# Patient Record
Sex: Female | Born: 1950 | Race: White | Hispanic: No | Marital: Married | Smoking: Current every day smoker
Health system: Southern US, Community
[De-identification: ages and names within clinical notes are randomized; demographics above are authoritative.]

## PROBLEM LIST (undated history)

## (undated) DIAGNOSIS — N289 Disorder of kidney and ureter, unspecified: Secondary | ICD-10-CM

## (undated) DIAGNOSIS — R569 Unspecified convulsions: Secondary | ICD-10-CM

## (undated) DIAGNOSIS — N179 Acute kidney failure, unspecified: Secondary | ICD-10-CM

## (undated) DIAGNOSIS — N390 Urinary tract infection, site not specified: Secondary | ICD-10-CM

## (undated) DIAGNOSIS — I639 Cerebral infarction, unspecified: Secondary | ICD-10-CM

## (undated) DIAGNOSIS — F0280 Dementia in other diseases classified elsewhere without behavioral disturbance: Secondary | ICD-10-CM

## (undated) DIAGNOSIS — E785 Hyperlipidemia, unspecified: Secondary | ICD-10-CM

## (undated) DIAGNOSIS — J449 Chronic obstructive pulmonary disease, unspecified: Secondary | ICD-10-CM

## (undated) DIAGNOSIS — D649 Anemia, unspecified: Secondary | ICD-10-CM

## (undated) DIAGNOSIS — I10 Essential (primary) hypertension: Secondary | ICD-10-CM

## (undated) DIAGNOSIS — R413 Other amnesia: Secondary | ICD-10-CM

## (undated) DIAGNOSIS — F05 Delirium due to known physiological condition: Secondary | ICD-10-CM

## (undated) DIAGNOSIS — Z9289 Personal history of other medical treatment: Secondary | ICD-10-CM

## (undated) DIAGNOSIS — Z8614 Personal history of Methicillin resistant Staphylococcus aureus infection: Secondary | ICD-10-CM

## (undated) DIAGNOSIS — G309 Alzheimer's disease, unspecified: Secondary | ICD-10-CM

## (undated) DIAGNOSIS — G4700 Insomnia, unspecified: Secondary | ICD-10-CM

## (undated) DIAGNOSIS — G929 Unspecified toxic encephalopathy: Secondary | ICD-10-CM

## (undated) DIAGNOSIS — N183 Chronic kidney disease, stage 3 (moderate): Secondary | ICD-10-CM

## (undated) DIAGNOSIS — G92 Toxic encephalopathy: Secondary | ICD-10-CM

## (undated) DIAGNOSIS — J441 Chronic obstructive pulmonary disease with (acute) exacerbation: Secondary | ICD-10-CM

## (undated) DIAGNOSIS — R410 Disorientation, unspecified: Secondary | ICD-10-CM

## (undated) HISTORY — DX: Other amnesia: R41.3

## (undated) HISTORY — DX: Delirium due to known physiological condition: F05

## (undated) HISTORY — DX: Chronic obstructive pulmonary disease, unspecified: J44.9

## (undated) HISTORY — PX: ABDOMINAL HYSTERECTOMY: SHX81

## (undated) HISTORY — PX: BLADDER SURGERY: SHX569

## (undated) HISTORY — PX: KNEE SURGERY: SHX244

---

## 1993-12-10 HISTORY — PX: CHOLECYSTECTOMY: SHX55

## 1998-07-26 ENCOUNTER — Ambulatory Visit (HOSPITAL_BASED_OUTPATIENT_CLINIC_OR_DEPARTMENT_OTHER): Admission: RE | Admit: 1998-07-26 | Discharge: 1998-07-26 | Payer: Self-pay | Admitting: Orthopedic Surgery

## 1998-11-18 ENCOUNTER — Ambulatory Visit (HOSPITAL_COMMUNITY): Admission: RE | Admit: 1998-11-18 | Discharge: 1998-11-18 | Payer: Self-pay | Admitting: Urology

## 1999-07-28 ENCOUNTER — Ambulatory Visit (HOSPITAL_COMMUNITY): Admission: RE | Admit: 1999-07-28 | Discharge: 1999-07-28 | Payer: Self-pay | Admitting: Urology

## 1999-09-15 ENCOUNTER — Encounter: Payer: Self-pay | Admitting: Urology

## 1999-09-18 ENCOUNTER — Encounter (INDEPENDENT_AMBULATORY_CARE_PROVIDER_SITE_OTHER): Payer: Self-pay | Admitting: *Deleted

## 1999-09-18 ENCOUNTER — Inpatient Hospital Stay (HOSPITAL_COMMUNITY): Admission: RE | Admit: 1999-09-18 | Discharge: 1999-09-28 | Payer: Self-pay | Admitting: Urology

## 1999-09-18 ENCOUNTER — Encounter: Payer: Self-pay | Admitting: Urology

## 1999-09-21 ENCOUNTER — Encounter: Payer: Self-pay | Admitting: Urology

## 1999-09-22 ENCOUNTER — Encounter: Payer: Self-pay | Admitting: Urology

## 1999-09-22 ENCOUNTER — Encounter: Payer: Self-pay | Admitting: Pulmonary Disease

## 1999-09-23 ENCOUNTER — Encounter: Payer: Self-pay | Admitting: Pulmonary Disease

## 1999-09-24 ENCOUNTER — Encounter: Payer: Self-pay | Admitting: Critical Care Medicine

## 1999-09-27 ENCOUNTER — Encounter: Payer: Self-pay | Admitting: Urology

## 1999-09-30 ENCOUNTER — Emergency Department (HOSPITAL_COMMUNITY): Admission: EM | Admit: 1999-09-30 | Discharge: 1999-09-30 | Payer: Self-pay

## 1999-10-06 ENCOUNTER — Encounter: Payer: Self-pay | Admitting: Urology

## 1999-10-06 ENCOUNTER — Encounter: Admission: RE | Admit: 1999-10-06 | Discharge: 1999-10-06 | Payer: Self-pay | Admitting: Hematology and Oncology

## 1999-12-21 ENCOUNTER — Other Ambulatory Visit: Admission: RE | Admit: 1999-12-21 | Discharge: 1999-12-21 | Payer: Self-pay | Admitting: Gastroenterology

## 1999-12-21 ENCOUNTER — Encounter (INDEPENDENT_AMBULATORY_CARE_PROVIDER_SITE_OTHER): Payer: Self-pay | Admitting: Specialist

## 2000-01-16 ENCOUNTER — Encounter: Admission: RE | Admit: 2000-01-16 | Discharge: 2000-01-16 | Payer: Self-pay | Admitting: Urology

## 2000-01-16 ENCOUNTER — Encounter: Payer: Self-pay | Admitting: Urology

## 2000-01-29 ENCOUNTER — Other Ambulatory Visit: Admission: RE | Admit: 2000-01-29 | Discharge: 2000-01-29 | Payer: Self-pay | Admitting: Family Medicine

## 2000-03-12 ENCOUNTER — Encounter: Admission: RE | Admit: 2000-03-12 | Discharge: 2000-03-12 | Payer: Self-pay | Admitting: Urology

## 2000-03-12 ENCOUNTER — Encounter: Payer: Self-pay | Admitting: Urology

## 2000-08-06 ENCOUNTER — Ambulatory Visit (HOSPITAL_BASED_OUTPATIENT_CLINIC_OR_DEPARTMENT_OTHER): Admission: RE | Admit: 2000-08-06 | Discharge: 2000-08-07 | Payer: Self-pay | Admitting: Orthopedic Surgery

## 2001-03-20 ENCOUNTER — Ambulatory Visit (HOSPITAL_BASED_OUTPATIENT_CLINIC_OR_DEPARTMENT_OTHER): Admission: RE | Admit: 2001-03-20 | Discharge: 2001-03-20 | Payer: Self-pay | Admitting: *Deleted

## 2001-10-28 ENCOUNTER — Ambulatory Visit (HOSPITAL_COMMUNITY): Admission: RE | Admit: 2001-10-28 | Discharge: 2001-10-28 | Payer: Self-pay | Admitting: *Deleted

## 2004-03-17 ENCOUNTER — Ambulatory Visit (HOSPITAL_COMMUNITY): Admission: RE | Admit: 2004-03-17 | Discharge: 2004-03-17 | Payer: Self-pay | Admitting: Urology

## 2004-03-17 ENCOUNTER — Ambulatory Visit (HOSPITAL_BASED_OUTPATIENT_CLINIC_OR_DEPARTMENT_OTHER): Admission: RE | Admit: 2004-03-17 | Discharge: 2004-03-17 | Payer: Self-pay | Admitting: Urology

## 2004-10-12 ENCOUNTER — Ambulatory Visit (HOSPITAL_COMMUNITY): Admission: RE | Admit: 2004-10-12 | Discharge: 2004-10-12 | Payer: Self-pay | Admitting: Urology

## 2004-10-24 ENCOUNTER — Ambulatory Visit (HOSPITAL_BASED_OUTPATIENT_CLINIC_OR_DEPARTMENT_OTHER): Admission: RE | Admit: 2004-10-24 | Discharge: 2004-10-24 | Payer: Self-pay | Admitting: Urology

## 2004-12-25 ENCOUNTER — Ambulatory Visit (HOSPITAL_COMMUNITY): Admission: RE | Admit: 2004-12-25 | Discharge: 2004-12-25 | Payer: Self-pay | Admitting: Urology

## 2005-01-02 ENCOUNTER — Ambulatory Visit (HOSPITAL_BASED_OUTPATIENT_CLINIC_OR_DEPARTMENT_OTHER): Admission: RE | Admit: 2005-01-02 | Discharge: 2005-01-02 | Payer: Self-pay | Admitting: Urology

## 2005-01-02 ENCOUNTER — Ambulatory Visit (HOSPITAL_COMMUNITY): Admission: RE | Admit: 2005-01-02 | Discharge: 2005-01-02 | Payer: Self-pay | Admitting: Urology

## 2005-01-17 ENCOUNTER — Ambulatory Visit (HOSPITAL_COMMUNITY): Admission: RE | Admit: 2005-01-17 | Discharge: 2005-01-17 | Payer: Self-pay | Admitting: Urology

## 2005-05-23 ENCOUNTER — Inpatient Hospital Stay (HOSPITAL_COMMUNITY): Admission: EM | Admit: 2005-05-23 | Discharge: 2005-05-27 | Payer: Self-pay | Admitting: Emergency Medicine

## 2005-07-26 ENCOUNTER — Encounter: Admission: RE | Admit: 2005-07-26 | Discharge: 2005-07-26 | Payer: Self-pay | Admitting: Orthopedic Surgery

## 2005-10-16 ENCOUNTER — Ambulatory Visit: Payer: Self-pay | Admitting: Gastroenterology

## 2005-10-17 ENCOUNTER — Ambulatory Visit: Payer: Self-pay | Admitting: Gastroenterology

## 2005-10-18 ENCOUNTER — Ambulatory Visit: Payer: Self-pay | Admitting: Family Medicine

## 2005-11-14 ENCOUNTER — Encounter (INDEPENDENT_AMBULATORY_CARE_PROVIDER_SITE_OTHER): Payer: Self-pay | Admitting: *Deleted

## 2005-11-14 ENCOUNTER — Ambulatory Visit: Payer: Self-pay | Admitting: Gastroenterology

## 2005-11-15 ENCOUNTER — Emergency Department (HOSPITAL_COMMUNITY): Admission: EM | Admit: 2005-11-15 | Discharge: 2005-11-15 | Payer: Self-pay | Admitting: Emergency Medicine

## 2005-11-15 ENCOUNTER — Ambulatory Visit: Payer: Self-pay | Admitting: Internal Medicine

## 2005-11-23 ENCOUNTER — Ambulatory Visit (HOSPITAL_COMMUNITY): Admission: RE | Admit: 2005-11-23 | Discharge: 2005-11-23 | Payer: Self-pay | Admitting: Urology

## 2005-12-14 ENCOUNTER — Ambulatory Visit (HOSPITAL_COMMUNITY): Admission: RE | Admit: 2005-12-14 | Discharge: 2005-12-14 | Payer: Self-pay | Admitting: Urology

## 2005-12-19 ENCOUNTER — Ambulatory Visit (HOSPITAL_COMMUNITY): Admission: RE | Admit: 2005-12-19 | Discharge: 2005-12-19 | Payer: Self-pay | Admitting: Urology

## 2005-12-21 ENCOUNTER — Encounter (INDEPENDENT_AMBULATORY_CARE_PROVIDER_SITE_OTHER): Payer: Self-pay | Admitting: Specialist

## 2005-12-21 ENCOUNTER — Inpatient Hospital Stay (HOSPITAL_COMMUNITY): Admission: RE | Admit: 2005-12-21 | Discharge: 2006-01-11 | Payer: Self-pay | Admitting: Urology

## 2006-01-21 ENCOUNTER — Inpatient Hospital Stay (HOSPITAL_COMMUNITY): Admission: RE | Admit: 2006-01-21 | Discharge: 2006-01-26 | Payer: Self-pay | Admitting: Urology

## 2006-02-06 ENCOUNTER — Ambulatory Visit (HOSPITAL_COMMUNITY): Admission: RE | Admit: 2006-02-06 | Discharge: 2006-02-06 | Payer: Self-pay | Admitting: Urology

## 2006-03-25 ENCOUNTER — Inpatient Hospital Stay (HOSPITAL_COMMUNITY): Admission: RE | Admit: 2006-03-25 | Discharge: 2006-03-28 | Payer: Self-pay | Admitting: Urology

## 2006-09-03 ENCOUNTER — Ambulatory Visit (HOSPITAL_COMMUNITY): Admission: RE | Admit: 2006-09-03 | Discharge: 2006-09-03 | Payer: Self-pay | Admitting: Urology

## 2007-10-10 ENCOUNTER — Inpatient Hospital Stay (HOSPITAL_COMMUNITY): Admission: EM | Admit: 2007-10-10 | Discharge: 2007-10-12 | Payer: Self-pay | Admitting: Emergency Medicine

## 2007-10-23 ENCOUNTER — Ambulatory Visit (HOSPITAL_COMMUNITY): Admission: RE | Admit: 2007-10-23 | Discharge: 2007-10-23 | Payer: Self-pay | Admitting: Urology

## 2007-10-28 ENCOUNTER — Ambulatory Visit (HOSPITAL_BASED_OUTPATIENT_CLINIC_OR_DEPARTMENT_OTHER): Admission: RE | Admit: 2007-10-28 | Discharge: 2007-10-28 | Payer: Self-pay | Admitting: Urology

## 2008-07-21 ENCOUNTER — Encounter: Payer: Self-pay | Admitting: Gastroenterology

## 2008-07-27 ENCOUNTER — Ambulatory Visit (HOSPITAL_COMMUNITY): Admission: RE | Admit: 2008-07-27 | Discharge: 2008-07-27 | Payer: Self-pay | Admitting: Urology

## 2008-08-08 ENCOUNTER — Encounter: Payer: Self-pay | Admitting: Gastroenterology

## 2008-08-12 ENCOUNTER — Encounter: Payer: Self-pay | Admitting: Gastroenterology

## 2008-08-30 ENCOUNTER — Encounter: Payer: Self-pay | Admitting: Gastroenterology

## 2008-10-25 ENCOUNTER — Encounter: Payer: Self-pay | Admitting: Gastroenterology

## 2008-10-25 DIAGNOSIS — L408 Other psoriasis: Secondary | ICD-10-CM | POA: Insufficient documentation

## 2008-10-25 DIAGNOSIS — K902 Blind loop syndrome, not elsewhere classified: Secondary | ICD-10-CM | POA: Insufficient documentation

## 2008-10-25 DIAGNOSIS — K219 Gastro-esophageal reflux disease without esophagitis: Secondary | ICD-10-CM | POA: Insufficient documentation

## 2008-10-25 DIAGNOSIS — Z860100 Personal history of colon polyps, unspecified: Secondary | ICD-10-CM | POA: Insufficient documentation

## 2008-10-25 DIAGNOSIS — J449 Chronic obstructive pulmonary disease, unspecified: Secondary | ICD-10-CM | POA: Insufficient documentation

## 2008-10-25 DIAGNOSIS — N809 Endometriosis, unspecified: Secondary | ICD-10-CM | POA: Insufficient documentation

## 2008-10-25 DIAGNOSIS — K222 Esophageal obstruction: Secondary | ICD-10-CM | POA: Insufficient documentation

## 2008-10-25 DIAGNOSIS — F079 Unspecified personality and behavioral disorder due to known physiological condition: Secondary | ICD-10-CM | POA: Insufficient documentation

## 2008-10-25 DIAGNOSIS — N3010 Interstitial cystitis (chronic) without hematuria: Secondary | ICD-10-CM | POA: Insufficient documentation

## 2008-10-25 DIAGNOSIS — N12 Tubulo-interstitial nephritis, not specified as acute or chronic: Secondary | ICD-10-CM | POA: Insufficient documentation

## 2008-10-25 DIAGNOSIS — E861 Hypovolemia: Secondary | ICD-10-CM | POA: Insufficient documentation

## 2008-10-25 DIAGNOSIS — N200 Calculus of kidney: Secondary | ICD-10-CM | POA: Insufficient documentation

## 2008-10-25 DIAGNOSIS — K449 Diaphragmatic hernia without obstruction or gangrene: Secondary | ICD-10-CM | POA: Insufficient documentation

## 2008-10-25 DIAGNOSIS — F0280 Dementia in other diseases classified elsewhere without behavioral disturbance: Secondary | ICD-10-CM | POA: Insufficient documentation

## 2008-10-25 DIAGNOSIS — R519 Headache, unspecified: Secondary | ICD-10-CM | POA: Insufficient documentation

## 2008-10-25 DIAGNOSIS — K912 Postsurgical malabsorption, not elsewhere classified: Secondary | ICD-10-CM | POA: Insufficient documentation

## 2008-10-25 DIAGNOSIS — K589 Irritable bowel syndrome without diarrhea: Secondary | ICD-10-CM | POA: Insufficient documentation

## 2008-10-25 DIAGNOSIS — R51 Headache: Secondary | ICD-10-CM | POA: Insufficient documentation

## 2008-10-25 DIAGNOSIS — K56609 Unspecified intestinal obstruction, unspecified as to partial versus complete obstruction: Secondary | ICD-10-CM | POA: Insufficient documentation

## 2008-10-25 DIAGNOSIS — G309 Alzheimer's disease, unspecified: Secondary | ICD-10-CM

## 2008-10-25 DIAGNOSIS — K6819 Other retroperitoneal abscess: Secondary | ICD-10-CM | POA: Insufficient documentation

## 2008-10-28 ENCOUNTER — Ambulatory Visit: Payer: Self-pay | Admitting: Gastroenterology

## 2008-10-28 DIAGNOSIS — R1310 Dysphagia, unspecified: Secondary | ICD-10-CM | POA: Insufficient documentation

## 2008-10-29 LAB — CONVERTED CEMR LAB
AST: 26 units/L (ref 0–37)
BUN: 21 mg/dL (ref 6–23)
Basophils Absolute: 0 10*3/uL (ref 0.0–0.1)
Bilirubin, Direct: 0.1 mg/dL (ref 0.0–0.3)
Calcium: 9.3 mg/dL (ref 8.4–10.5)
Chloride: 113 meq/L — ABNORMAL HIGH (ref 96–112)
Eosinophils Relative: 1.1 % (ref 0.0–5.0)
Ferritin: 178.7 ng/mL (ref 10.0–291.0)
Folate: 6.7 ng/mL
Glucose, Bld: 73 mg/dL (ref 70–99)
Hemoglobin: 10.4 g/dL — ABNORMAL LOW (ref 12.0–15.0)
Iron: 82 ug/dL (ref 42–145)
Lymphocytes Relative: 29.7 % (ref 12.0–46.0)
Magnesium: 1.6 mg/dL (ref 1.5–2.5)
Neutrophils Relative %: 63.1 % (ref 43.0–77.0)
Potassium: 4.6 meq/L (ref 3.5–5.1)
Saturation Ratios: 26 % (ref 20.0–50.0)
Sodium: 140 meq/L (ref 135–145)
TSH: 1.68 microintl units/mL (ref 0.35–5.50)
Transferrin: 225.6 mg/dL (ref >=212.0)

## 2008-11-01 ENCOUNTER — Ambulatory Visit: Payer: Self-pay | Admitting: Gastroenterology

## 2008-11-01 DIAGNOSIS — E538 Deficiency of other specified B group vitamins: Secondary | ICD-10-CM | POA: Insufficient documentation

## 2008-11-03 ENCOUNTER — Ambulatory Visit: Payer: Self-pay | Admitting: Gastroenterology

## 2008-11-03 ENCOUNTER — Encounter: Payer: Self-pay | Admitting: Gastroenterology

## 2008-11-08 ENCOUNTER — Encounter: Payer: Self-pay | Admitting: Gastroenterology

## 2008-11-16 ENCOUNTER — Ambulatory Visit: Payer: Self-pay | Admitting: Gastroenterology

## 2008-11-24 ENCOUNTER — Ambulatory Visit: Payer: Self-pay | Admitting: Gastroenterology

## 2008-11-30 ENCOUNTER — Ambulatory Visit: Payer: Self-pay | Admitting: Gastroenterology

## 2010-12-31 ENCOUNTER — Encounter: Payer: Self-pay | Admitting: Urology

## 2011-04-24 NOTE — Discharge Summary (Signed)
[name], [name]                   ACCOUNT NO.:  [id number]   MEDICAL RECORD NO.:  [id number]          PATIENT TYPE:  INP   LOCATION:  55                         FACILITY:  [facility]   PHYSICIAN:  Sigmund I. Gaynelle Arabian, M.D.DATE OF BIRTH:  1951-07-25   DATE OF ADMISSION:  10/10/2007  DATE OF DISCHARGE:                               DISCHARGE SUMMARY   ADMISSION DIAGNOSIS:  Distal 1-mm left ureteral stone with perinephric  stranding and severe left hydronephrosis.   DISCHARGE DIAGNOSIS:  Left ureteral obstruction, 1-mm distal left stone.   HISTORY OF PRESENT ILLNESS AND HOSPITAL COURSE:  [name] was admitted  on October 10, 2007.  This is a 60 year old female that lives in  [place] that had left flank pain for 1 week with significant increase  in pain on October 10, 2007.  She had been recently released from  [facility] and was seen again at the [facility] ED with pain,  malaise and nausea.  CT showed a 1-mm stone, left lower ureter stone,  with severe perinephric stranding, left ureteral reimplantation in 2007  by Dr. Amalia Hailey.   The patient has been doing well this hospitalization.  Nausea and  vomiting are now resolved with IV hydration and placement of left PCN.  She is tolerating her p.o. fluids and a regular diet. She had a  percutaneous nephrostomy tube placed in the left kidney 10/11/07 by  interventional radiologist.   The patient has been afebrile throughout this hospital stay;  temperatures have ranged anywhere from 97.9 to 98.6.  Vital signs have  been stable.  Blood pressure has been between 129/44 to 154/62.  Heart  rate has been between 58 and 72.  Room air saturations have been 96% to  97%.  Intake/output have been adequate with Foley output of  approximately 500 mL for the last 24 hours and her left nephrostomy tube  has had an output of 325 mL of pink-tinged urine.  PCN site is  unremarkable with dressing that is CDI.   PLAN:  To discharge the patient  home.  She is to follow up with Dr.  Amalia Hailey this coming week.   DISCHARGE MEDICATIONS:  She will be sent home on:  1. Flomax 0.4 mg on p.o. nightly.  2. Urocit-K 10 mEq one p.o. b.i.d.  3. Cipro 500 mg one p.o. b.i.d. for 5 days.   She is to continue on her home medications which include:  1. Aricept 10 mg daily.  2. Phenergan p.r.n.  3. Vicodin p.r.n.      [name] Ralph, NP      Sigmund I. Gaynelle Arabian, M.D.  Electronically Signed    DW/MEDQ  D:  10/12/2007  T:  10/13/2007  Job:  [id number]

## 2011-04-24 NOTE — Op Note (Signed)
[name], [name]                   ACCOUNT NO.:  [id number]   MEDICAL RECORD NO.:  [id number]          PATIENT TYPE:  AMB   LOCATION:  NESC                         FACILITY:  [facility]   PHYSICIAN:  Domingo Pulse, M.D.  DATE OF BIRTH:  [birth year]   DATE OF PROCEDURE:  10/28/2007  DATE OF DISCHARGE:                               OPERATIVE REPORT   PREOPERATIVE DIAGNOSIS:  Distal ureteral stricture, possible ureteral  calculus.   POSTOPERATIVE DIAGNOSIS:  Distal ureteral stricture, possible ureteral  calculus.   PROCEDURE:  Cystoscopy, removal of the existing left double-J, dilation  of distal left ureteral stricture, ureteroscopy, replacement of left  double-J, left retrograde.   SURGEON:  Domingo Pulse, M.D.   ANESTHESIA:  General.   COMPLICATIONS:  None.   DRAINS:  A 8-French x 24-cm double-J catheter.   BRIEF HISTORY:  This 60 year old female is status post cystectomy and  continent urinary diversion to the urethra.  The patient has undergone  reimplantation of the right ureter due to partial obstruction.  The  patient recently developed some problems with left side with renal  colic.  The patient had a nephrostomy tube placed to decompress the  kidney.  Follow-up retrograde studies did not demonstrated obvious stone  but there had been concern initially that the patient might have a stone  in the distal left ureter.  The patient has a double-J catheter in  place.  She is now to undergo ureteroscopy to evaluate the distal left  ureter and balloon dilation if there is any stricture present.  The  patient understands the risks and benefits of the procedure and gave  full informed consent.   PROCEDURE:  After successful induction of general anesthesia the patient  was placed in the dorsal lithotomy position, prepped with Betadine and  draped in the usual sterile fashion.  Cystoscopy was performed.  The  patient's neobladder was inspected.  No stones could be seen in the  bladder.  There was some mucus as expected.  The right ureteral orifice  was identified.  It was wide open.  It is up high on the right-hand side  and very fishmouthed in nature and clearly there is no obstruction.  On  the left-hand side the existing ureteral stent was seen to exit from the  ureter, high up as one would expect with a neobladder.  The double-J was  grasped and partially removed.  A guidewire was then passed up through  the double-J which was removed completely.  A retrograde study was then  performed.  The patient was noted to have mild dilation of the  collecting system without any clear-cut filling defects identified.  The  patient had a balloon dilator then placed over the wire and was used to  dilate the distal ureter.  This went in easily and it certainly did not  appear there was any clear-cut stricture but it was felt that dilating  this would be worthwhile.  The ureteroscope was then advanced alongside  the wire all way to the kidney.  No stones were identified and it  was a  wide open system.  The guidewire was left in place.  The ureteroscope  was withdrawn.  Under fluoroscopic guidance a double-J catheter was  passed up into the kidney.  This was an 8-French catheter, was to be  left in place  for approximately one week and then removed.  The patient tolerated  procedure well, was taken to recovery in good condition.  She received a  B & O suppository.  She will be sent home with Lorcet Plus as well as  Septra DS and return to see me in follow-up.      Domingo Pulse, M.D.  Electronically Signed     RJE/MEDQ  D:  10/28/2007  T:  10/28/2007  Job:  [id number]

## 2011-04-24 NOTE — Consult Note (Signed)
NAMEKASONDRA, SANDLER                   ACCOUNT NO.:  [id number]   MEDICAL RECORD NO.:  [id number]          PATIENT TYPE:  INP   LOCATION:  29                         FACILITY:  [facility]   PHYSICIAN:  Sigmund I. Gaynelle Arabian, M.D.DATE OF BIRTH:  01-13-1951   DATE OF CONSULTATION:  DATE OF DISCHARGE:  10/12/2007                                 CONSULTATION   NOTE:  Consultation admission note.   HISTORY:  Ms. Kuchar is a 60 year old white married female from [place],  [place], with 1 week of increasing left flank pain.  The patient  had severe increase in pain today, with nausea, malaise.  She was seen  and released from hospital in [place], and told to follow up with her  physician.  However, the patient cannot wait to see Dr. Amalia Hailey until next  week, and was seen at [facility] emergency room with severe left upper  quadrant and left flank pain and left lower quadrant pain, malaise and  nausea.  CT scan shows a 1 mm stone at the lower left ureter with severe  perinephric stranding and multiple stones bilaterally.  It is noted that  the patient is status post left ureteral reimplantation in 2007, with  cystectomy and Studer neobladder several years ago.   PAST MEDICAL HISTORY:  1. Renal stones treated with percutaneous nephrostomy in the past.  2. Cystectomy and Studer bladder.  3. Alzheimer's disease.  4. Tobacco abuse (50 pack years).  5. Psoriasis.   SOCIAL HISTORY:  Alcohol is none.  The patient is married.  The husband  is the power of attorney.   ALLERGIES:  None known.   MEDICATIONS:  Aricept 10 mg one p.o. per day.   REVIEW OF SYSTEMS:  Is significant for fever, general malaise, nausea,  abdominal pain.  Remaining review of systems noncontributory.   ADMISSION PHYSICAL EXAMINATION:  GENERAL:  Shows a thin, dry, ill-  appearing female in moderate distress.  Is somewhat relieved with IV  Dilaudid.  VITAL SIGNS:  Weight is 113 pounds.  Temperature 97.7, pulse 112,  respiratory rate 22, blood pressure 158/97.  NECK:  Supple, nontender.  CHEST:  Was clear to P and A.  BREASTS:  Not applicable.  COR:  S1, S2 normal with sinus tachycardia.  ABDOMEN:  Multiple abdominal incisions and scars.  The patient has 2-3+  tenderness in the left flank, left upper quadrant, left lower quadrant.  GU: Examination shows normal female external genitalia.  EXTREMITIES:  No cyanosis or edema.  PSYCHOLOGIC:  Normal orientation to time, person, place.  The patient  does have a history of Alzheimer's but does answer questions  appropriately.  SKIN:  Is dry.   IMPRESSION:  Left lower ureteral calculus, in patient with Arlina Robes  neobladder/cystectomy for interstitial cystitis and left ureteral  reimplantation with narrowing of the ureter.  She has perinephric  stranding and will have percutaneous nephrostomy tomorrow morning per  Dr. Daryll Brod.      Sigmund I. Gaynelle Arabian, M.D.  Electronically Signed     SIT/MEDQ  D:  10/10/2007  T:  10/12/2007  Job:  [id number]   cc:   Domingo Pulse, M.D.  Fax: [phone]

## 2011-04-27 NOTE — Op Note (Signed)
[name], [name]                   ACCOUNT NO.:  [id number]   MEDICAL RECORD NO.:  [id number]          PATIENT TYPE:  INP   LOCATION:  1409                         FACILITY:  [facility]   PHYSICIAN:  Domingo Pulse, M.D.  DATE OF BIRTH:  1951-04-25   DATE OF PROCEDURE:  12/21/2005  DATE OF DISCHARGE:                                 OPERATIVE REPORT   SERVICE:  Urology.   PREOPERATIVE DIAGNOSES:  Distal right ureteral obstruction status post  cystectomy and substitution cystoplasty.   POSTOPERATIVE DIAGNOSES:  Distal right ureteral obstruction status post  cystectomy and substitution cystoplasty.   PROCEDURE:  Ureteral reimplantation with partial resection.   SURGEON:  Domingo Pulse, M.D.   ASSISTANT:  Hanley Ben, M.D.   ANESTHESIA:  General.   COMPLICATIONS:  Enterotomy repair intraoperatively.   HISTORY:  This 60 year old female had end-stage interstitial cystitis and  underwent cystectomy and urinary diversion to the urethra. A right sided  based system was utilized and the patient developed a very large  capacious  pouch. The patient had some problems in the past with obstruction of the  right ureter. She had this treated endoscopically with balloon dilation in  the distal ureter and placement of stent for approximately 12 weeks.  Following removal of the stent, followup imaging studies showed a good wide  open ureter but over time this has become more obstructive and the patient  has had increasing pain. When the pain became quite severe and it was  apparent that the obstruction had returned, she had a nephrostomy tube  placed and a double-J stent passed down into the neobladder. The patient  felt better with the stent in place but needs to have a permanent solution  to this problem. After some discussion of the options, the patient has  elected to undergo reimplantation of the ureter. We will attempt to do this  through a lateral incision to stay away from the  neobladder. The patient  understands the risks and benefits of the procedure including the  possibility that there could be injury to adjacent structures or damage to  the pouch itself. She gave full informed consent.   DESCRIPTION OF PROCEDURE:  After successful induction of general anesthesia,  the patient was placed in a modified flank position. She was primarily  pushed up somewhat on the right hand side to elevate the operative field and  the bed was broken and she was partially flexed. She was then prepped and  draped in the usual sterile fashion. A modified flank incision was made and  was carried down through the oblique musculature until the retroperitoneal  space was identified. The peritoneal contents were swept medially and the  entire procedure was done through the retroperitoneal space. The ureter was  identified and was grasped with a Babcock clamp, sharp dissection was used  to free this up all the way down to the bladder. The ureter was transected  at that level and the opening into the neobladder was oversewn. The distal  ureter was quite ischemic and narrowed. The neobladder was then filled  with  water and a smallneedle was utilized to find access into the pouch. It was  somewhat difficult to tell exactly where the pouch ended and small bowel  began because the bowel had adhered to the top of the pouch. At one point, a  small opening was made in what was thought was the neobladder because a  needle had been passed into that area and clear urine was obtained but a  small piece of bowel that was adherent to the bladder was inadvertently  opened. This was closed with three layers, the first two of silk and finally  of Vicryl. This was just a tiny little enterotomy which was easily closed.  The entire pouch was mobilized a little further and a nice area to implant  the ureter was found posteriorly. The opening was made after the area had  been identified with the needle and  prompt flow of the irrigation fluid was  obtained. The patient's ureter was then spatulated and a fishmouth  anastomosis made with absolutely no tunneling. The anastomosis made with a  series of interrupted sutures of 4-0 Vicryl. The previously placed double-J  was utilized. The area was inspected, adequate hemostasis had been obtained.  The enterotomy had been very nicely closed and was well away from the area  where the ureteral anastomosis had been performed. A small drain was placed  in that area and was brought out through a stab incision. The incision was  then closed in multiple layers with #1 PDS. The skin was closed with  surgical clips. The patient tolerated the procedure well and was taken to  the recovery room in good condition.           ______________________________  Domingo Pulse, M.D.  Electronically Signed     RJE/MEDQ  D:  12/21/2005  T:  12/22/2005  Job:  [id number]   cc:   Teressa Lower  Fax: [phone]

## 2011-04-27 NOTE — Op Note (Signed)
[name], [name]                   ACCOUNT NO.:  [id number]   MEDICAL RECORD NO.:  [id number]          PATIENT TYPE:  AMB   LOCATION:  NESC                         FACILITY:  [facility]   PHYSICIAN:  Domingo Pulse, M.D.  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   POSTOPERATIVE DIAGNOSIS:  Right ureterovesical junction stricture status  post cystectomy and [place] pouch urinary diversion to the urethra.   PROCEDURE:  Cystoscopy, removal of existing right double J, dilation of  right ureteral stricture, right ureteroscopy, right double J catheter  insertion, removal of nephrostomy tube.   SURGEON:  Dr. Amalia Hailey.   ANESTHESIA:  General.   COMPLICATIONS:  None.   DRAINS:  8-French x 24-cm double J.   HISTORY:  This 60 year old female had a cystectomy and [place] pouch urinary  diversion done because of end-stage pelvic pain. The patient has really done  quite nicely with this but recently developed some right sided pain. The  complete story is not available, but it sounds as if she was admitted to the  hospital in [place] where a stone in the right ureter was pushed down and  into the pouch. The CT report that I obtained clearly did not realize that  the patient had had a cystectomy pouch and noted that the left ureteral  deviated from the left side toward the midline where it implants into the  pouch. The patient came to my office where she had clear-cut right sided  pain. A stone protocol CT showed no signs of a stone. CT scan that had been  obtained in [place] showed what was thought to be a 3-mm right UVJ stone.  When we repeated the study here with contrast, it was clear that the patient  had partial obstruction with hydronephrosis and collection of  contrast, and  arrangements were made for her to undergo placement of a nephrostomy tube.  The nephrostomy tube was placed in the kidney, and the kidney was  decompressed. She subsequently had a double J passed in an antegrade  fashion. The patient is now to undergo removal of the existing double J,  passage of a wire to the kidney, dilation of the ureter, ureteroscopy to  check for stones, passage of a new double J, and removal of the nephrostomy  tube. The patient understands the risks and benefits of the procedure and  gave full informed consent.   PROCEDURE:  After successful induction of general anesthesia, the patient  was placed in the dorsal lithotomy position and prepped with Betadine and  draped in usual sterile fashion. The cystoscope was inserted into the  [place] pouch. The pouch had mucus in it which was evacuated. The stent was  identified. It was partially encrusted with mucus and stone material. A wire  was passed up into the collecting system which was not particularly dilated  since the  nephrostomy tube had been placed. The catheter was removed. A  ureteral catheter was passed up to the kidney and used to opacify the  collecting system. A 10-cm balloon dilator was then passed over the wire,  and the entire ureter was dilated. Cystoscope was inserted along side the  wire to ensure that the appropriate areas were dilated and to particularly  make sure that the actual UVJ opening was dilated. A ureteroscope was then  advanced along side the guide wire which came out of the ureter which was in  the posterior upper position in the bladder. The ureteroscope was advanced  into the opening, advanced all the way up to the kidney where the  nephrostomy tube could be identified. There was no stoma tear detectable on  the course of the ureter. Ureteroscope was removed. An 8-French x 24-cm  double J was passed up to the kidney where it coiled within the renal  pelvis, and there was  extra length found within the bladder. The bladder was  drained. The patient tolerated the procedure well. She was then rolled up on  her side, and the nephrostomy tube was removed. A small dressing was  applied. The patient tolerated the procedure and was taken to the recovery  room in good condition. She will leave this double J in for about 2 weeks.  She was went home on Lorcet 10 and Cipro.      RJE/MEDQ  D:  10/24/2004  T:  10/24/2004  Job:  [id number]

## 2011-04-27 NOTE — Op Note (Signed)
[name], [name]                   ACCOUNT NO.:  [id number]   MEDICAL RECORD NO.:  [id number]          PATIENT TYPE:  AMB   LOCATION:  DAY                          FACILITY:  [facility]   PHYSICIAN:  Domingo Pulse, M.D.  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   PROCEDURE:  Scar revision with removal of permanent suture and granulation  tissue.   SURGEON:  Domingo Pulse, M.D.   ANESTHESIA:  General.   COMPLICATIONS:  None.   DRAINS:  A #10 round Blake drain.   HISTORY:  This 60 year old female has had extensive surgery.  The patient  has a lower abdominal incision from a cystectomy and neobladder  done a  number of years ago, and this crosses an old Pfannenstiel incision from  previous hysterectomy.  The patient has also had to have several other  incisions in the flank area because of problems with her ureters.  The  patient has recovered nicely from all of the surgery but has developed  granulation tissue with some chronic drainage in the lower abdomen.  She is  not to undergo scar revision with removal of any suture material that may be  involved.  The patient understands the risks and benefits of the procedure  and gave full informed consent.   DESCRIPTION OF PROCEDURE:  After successful induction of general anesthesia,  the patient was placed in the dorsal lithotomy position, prepped with  Betadine, and draped in the usual sterile fashion.   An elliptical incision was marked down the lower abdomen to remove all of  the scar on that lower portion of the abdomen in the midline to insure that  all the granulation tissue was removed.  The scar was excised, and the  resultant tissue was undermined.   In the area where the granulation tissue  was noted, there was a large permanent suture that was excised.  There were  several other areas where permanent suture was excised from along the course  of the Pfannenstiel incision.  Flaps were actually raised bilaterally all  the way out to the lateral aspect of the end of the Pfannenstiel incision to  insure that all irregular tissue had been removed.  The areas where the  fascia was thin were closed with 2-0 Vicryl sutures in a figure-of-eight  fashion to insure that no prominent permanent suture was left in place.  The  area was carefully irrigated.  The flaps were inspected, and it did appear  that there was enough to reach the midline without much difficulty.  The  patient was then closed in layers with 2-0 Vicryl figure-of-eight sutures in  the Scarpa's fascia to take the pressure off of the incision.  The patient's  incision was then closed with surgical staples.  The patient had a drain  brought out through a stab incision, and this was placed to straight  drainage.  This will be left in place until the sutures are removed to  insure that the patient does not develop a seroma.   The patient tolerated the procedure well and was taken to the recovery room  in good condition.  She will be sent home with Keflex as well as pain  medication and will return to see me in approximately 10 days for drain  removal and suture removal.           ______________________________  Domingo Pulse, M.D.  Electronically Signed     RJE/MEDQ  D:  09/03/2006  T:  09/05/2006  Job:  [id number]

## 2011-04-27 NOTE — Op Note (Signed)
[name], EMICK NO.:  [id number]   MEDICAL RECORD NO.:  [id number]          PATIENT TYPE:  INP   LOCATION:  [id number]                         FACILITY:  [facility]   PHYSICIAN:  Domingo Pulse, M.D.  DATE OF BIRTH:  [birth year]   DATE OF PROCEDURE:  01/22/2006  DATE OF DISCHARGE:                                 OPERATIVE REPORT   SERVICE:  Urology.   PREOPERATIVE DIAGNOSIS:  Right retroperitoneal abscess.   POSTOPERATIVE DIAGNOSIS:  Right retroperitoneal abscess.   PROCEDURE:  Incision and drainage of abscess.   SURGEON:  Domingo Pulse, M.D.   ASSISTANT:  Hanley Ben, M.D.   ANESTHESIA:  General.   COMPLICATIONS:  None.   DRAINS:  Two Jackson-Pratt drains.   BRIEF HISTORY:  This 60 year old female has a very complicated history. In  brief summation, the patient had end-stage interstitial cystitis and ended  up with a continent diversion to her urethra. This was done over 10 years  ago. The patient had a right sided colon based substitution cystoplasty. The  patient eventually developed some stricturing of the distal right ureter.  This was initially treated with endoscopic technique with antegrade  placement of a wire and eventual retrograde balloon dilation of the distal  ureter. This held her open for a while but eventually the area became  ischemic and she restrictured. Because of persistent pain on the right hand  side, the patient requested a reimplant of the right ureter.   The patient underwent a reimplantation of the right ureter into the [place]  pouch. The procedure was difficult in that there was bowel wrapped all  around the pouch and a small enterotomy was made and repaired. Her  postoperative course was complicated by prolonged ileus/small bowel  obstruction although that eventually resolved. The patient also had  persistent drainage out of the double-J. A nephrostomy did not show a leak  but the fluid that came out of the JP did  appear to be consistent with urine  based on creatinine. The drainage at first was quite clear and then it  became cloudy. A culture was obtained and this showed yeast although no  bacteria. The patient was treated with Diflucan and a pair of nephrostomy  tubes was placed to try and dry the area up and stop whatever urine leak  might be present. There was no sign of any leak on the nephrostogram so it  was a little bit unclear as to why the patient had a leak despite a negative  pouchogram and a negative nephrostogram.   The patient slowly recovered from the ileus/small-bowel obstruction but had  a difficult time eating and was given TPN. She eventually felt well enough  and requested that she go home. A CT scan did show that there did appear to  still be a fluid collection back there that they thought might be consistent  with a loculated abscess. The patient was told that we could give her a  chance to see if this would slow down on its own because she truly felt that  she needed to get out of the hospital. Since she was afebrile and had a  normal white count, it was felt that this would be reasonable although it  was noted that she was somewhat anemic.   The patient was seen in the office several days out from the discharge. The  patient still had drainage coming out of the Jackson-Pratt drain. After  careful discussion, she decided that she would electively like to have that  area redrained. We certainly did give some consideration to having a  percutaneous drain placed by the radiologist but given the complicated  nature of her previous surgery as well as the fact that the CT scan showed  what appeared to be a fairly large abscess, it was thought that placement of  a surgical drain would make more sense. This was particularly true given the  fact that she did not have complete drainage of the abscess despite the fact  that the JP had been left intact. The patient is brought into the  hospital  in advance in order to receive an adequate transfusion to bring her blood  count up to a normal level. She was placed on Rocephin and gentamycin  preoperatively but a culture has come back that shows that she has an  enterococcus and for that reason this will be switched postoperatively to  ampicillin and gentamycin. The patient was scheduled to have a bowel prep  but unfortunately the nurses on the floor did not administer the bowel prep.  We know however that the patient has had nothing to eat or drink. She is  only to be explored retroperitoneally and it is felt that we can proceed  with the procedure despite the fact that the bowel prep was not completed.  She certainly seems to be decompressed. The patient understands the risks  and benefits of the procedure including the fact that we will likely leave  her incision partially open. Full informed consent was obtained.   DESCRIPTION OF PROCEDURE:  After successful induction of general anesthesia,  the patient was placed in the supine position with a bump under the right  flank. The legs were frog leg, PAS stockings were applied. The previous JP  drain site was noted to have purulent drainage. This is the same material  that had been previously cultured and just came back as an enterococcus  infection. She was placed in the frog leg position in order to allow access  to the perineum for placement of a new Foley catheter. The patient has had a  standard Foley catheter in place but records indicate that there is a  question of a latex allergy and for that reason the entire room was latex  free and new catheters will be placed that are latex free. The patient had  been marked in the preop holding area with the surgeon's initials on the  dressings. When the dressings were taken down, I was informed that  Dr.Fillipo insists that this is not an adequate marking and that the patient be remarked on the skin. Despite the fact that we  are clearly going to be  replacing an existing drain, I dutifully marked the area with my initials on  the skin after the patient had been taken to the operating room. She was  then fully prepped and draped.   An incision was made in the lateral third of the previous flank incision.  This was carried down until the fascia was identified. The lateral aspect of  the incision was opened and the drain was identified going down into the  retroperitoneal space. Despite the fact that the CT scan had shown a 7 cm  abscess, there really was very little purulent material in that area. The  area was carefully explored, the peritoneum was retracted medially. Dr. Janice Norrie  and I carefully looked at that area and did not see anything unusual and  felt that ideally we should not explore the peritoneum for fear of  contributing to peritonitis. There did not appear to be a bowel obstruction  and the abdomen appeared to be completely decompressed. We could see no area  where urine leaked or where bowel context leaked. We irrigated the catheter  and saw no leak of any kind. The double-J catheter was identified  posteriorly and appeared to be intact appropriately positioned. The  nephrostomy tube was also identified on the right hand side. The area was  carefully explored and loculations were broken up but no additional purulent  material was identified. It certainly appears that the patient's abscess may  have been getting smaller but because the JP drain was not aspirating all of  it, placement of additional JP drains seemed appropriate. The patient had  two drains placed, the entire area was then irrigated with antibiotic  solution. The fascia was then closed with three figure-of-eight sutures of  #1 PDS hoping not to place a permanent suture in an area that potentially is  infected. The skin was loosely approximated and saline wet to dry dressings  were placed. The patient's two drains were sutured in place  with 3-0 nylon  and were placed to bulb suction. The patient tolerated the procedure well  and was taken to the recovery room in good condition. She did have a new  Foley catheter placed that will go to straight drainage and will be  irrigated every shift. The nephrostomy  tubes will be removed. If the nephrostomy tube study looks good and the  patient will receive postoperative TPN. Once the patient is able to eat  regular food, she will be given the opportunity to go home. As noted above,  she will be treated postoperatively with a combination of ampicillin and  gentamycin for enterococcus.           ______________________________  Domingo Pulse, M.D.  Electronically Signed     RJE/MEDQ  D:  01/22/2006  T:  01/22/2006  Job:  [id number]   cc:   Teressa Lower  Fax: [phone]

## 2011-04-27 NOTE — Discharge Summary (Signed)
NAMEJOIA, TATIS                   ACCOUNT NO.:  [id number]   MEDICAL RECORD NO.:  [id number]          PATIENT TYPE:  INP   LOCATION:  1430                         [facility]ACILITY:  [facility]   PHYSICIAN:  Domingo Pulse, M.D.  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   PROCEDURE:  Exploratory laparotomy and re-implantation of the left ureter  and kidney and  bladder.   HISTORY OF PRESENT ILLNESS AND HOSPITAL COURSE:  Ms. Shultis is a 60 year old  female lady who has a complicated urologic history.  She had end-stage  interstitial cystitis for which she underwent cystectomy and formation of  right cystectomy and formation of a right colon- based neobladder over 10  years ago.  Patient started having ureteral obstruction on the right side  that was treated initially endoscopically, then underwent right ureteral  implantation.  Postoperatively the patient had persistent urinary drainage  and eventually had bilateral nephrostomy tubes in place.  On the left side  there was not any ability to put a double J stent, thus, leaving a left-  sided obstruction.  The patient now presents for re-implantation of the left  side.  It should be noted that the patient does have a double J stent on the  right side and nephrostomy tube on the left side.  The patient was admitted  on March 25, 2006 where she successfully underwent the procedure.  Postoperatively the patient had a double J stent on the left side while the  double J stent on the right side was removed intraoperatively.  The patient  also had a JP drain and a Foley catheter.  Her postoperative course was  relatively smooth and uneventful.  Her pain was quickly controlled by p.o.  medications.  Her JP output trended down and the patient was ambulating with  no problems.  She was tolerating a regular diet with no problems.  On  postoperative day three the patient's JP was removed after minimal output  was noted and clear urine output was noted through the neobladder Foley.  The patient was subsequently discharged on pain medication and some  antibiotics.  The patient on discharge was afebrile.  Her vital signs are  stable.  She was awake, alert, and oriented.  Abdomen was soft and  nontender.  Gross motor neurological examination was nonfocal.  Patient was  to follow up in Dr. Amalia Hailey' office in about one to two weeks with a cystogram  to be done in the office before possibly removing Foley.  Should the patient  have any fevers, abdominal pain, questions, or concerns she is to contact us  or come to the ED.     ______________________________  Peterson Lombard, MD      Domingo Pulse, M.D.  Electronically Signed    JH/MEDQ  D:  04/02/2006  T:  04/03/2006  Job:  [id number]

## 2011-04-27 NOTE — Consult Note (Signed)
[name], [name]                   ACCOUNT NO.:  [id number]   MEDICAL RECORD NO.:  [id number]          PATIENT TYPE:  INP   LOCATION:  1409                         FACILITY:  [facility]   PHYSICIAN:  Sammuel Hines. Daiva Nakayama, M.D. DATE OF BIRTH:  05/31/1951   DATE OF CONSULTATION:  12/29/2005  DATE OF DISCHARGE:                                   CONSULTATION   HISTORY:  [name] is a 60 year old white female who has a history of a  cystectomy and creation of a neobladder about 10 years ago.  She recently  presented to Dr. Amalia [name] with an obstructed ureter.  This was repaired through  a right flank excision about eight days ago.  Since that time she has had  signs of a bowel obstruction.  Her abdomen has been distended with no bowel  movements.  She has had an NG placed for the last two days and her exam is  pretty much unchanged.  She feels thirsty.  She denies any nausea and  vomiting but the NG is in place.  No chest pain, shortness of breath,  diarrhea, or dysuria.  Other review of systems is unremarkable.   PAST MEDICAL HISTORY:  Significant for:  1.  Alzheimer's disease.  2.  COPD.  3.  Esophageal reflux.  4.  Spastic colon.  5.  Psoriasis.   PAST SURGICAL HISTORY:  Significant for:  1.  Cystectomy.  2.  Hysterectomy.  3.  Creation of a neobladder with right colon.  4.  Recent right flank exploration.  5.  Elbow and knee surgery.   MEDICATIONS:  Include:  1.  Aricept.  2.  Wellbutrin.  3.  Restoril.  4.  Motrin.   ALLERGIES:  1.  CODEINE.  2.  LATEX.   SOCIAL HISTORY:  She denies any current use of alcohol.  She does smoke.   FAMILY HISTORY:  Non-contributory.   PHYSICAL EXAMINATION:  VITAL SIGNS:  T-max is 100.3, heart rate is 120,  blood pressure is 98/60.  GENERAL:  She is a well-developed well-nourished white female in no acute  distress.  HEENT:  Eyes, extra ocular muscles are intact.  Pupils equal, round, and  reactive to light.  Sclerae non-icteric.  LUNGS:  Clear  bilaterally with no use of accessory muscles.  HEART:  Has a regular rate and rhythm without tachycardia with the impulse  in the left chest.  ABDOMEN:  Soft, quite, distended.  She has a clean and intact right flank  incision.  She is non-tender.  She has only mild tenderness at the incision  site.  A well-healed midline scar.  EXTREMITIES:  No cyanosis, clubbing, or edema.  Good strength in her arms  and legs.  PSYCHOLOGICAL:  She is alert and oriented x3 with no evidence of anxiety or  depression.   LABORATORY WORK:  It is significant for a white count of 10,000.  Creatinine  is 1.6, which is elevated.  She had a CT two days ago, which did show signs  of a bowel obstruction.   ASSESSMENT/PLAN:  This is  a 60 year old white female with what appears to be  a bowel obstruction versus an ileus.  I spoke with Dr. Amalia [name] and he is  concerned that the bowel obstruction may be coming from an enterotomy  closure at the time of the right flank exploration.  She has had a  nasogastric tube in for about two days and does not seem to be improved.  Because she does not seem to be improving she may require exploration in the  very near future for this.  Dr. Amalia [name] would like to explore her through her  right flank incision and if this is as planned I will be happy to assist him  with this.  If this does not allow [place] to fix the obstruction, then she may  need a more formal midline exploration.  She also has signs of hypovolemia  with low urine output and tachycardia with an increasing blood urea nitrogen  and creatinine.  We will plan to increase her intravenous fluids and give  her a bolus of saline.  We will follow her closely with you.      Sammuel Hines. Daiva Nakayama, M.D.  Electronically Signed     PST/MEDQ  D:  12/29/2005  T:  12/29/2005  Job:  [id number]

## 2011-04-27 NOTE — Discharge Summary (Signed)
[name], [name]                   ACCOUNT NO.:  [id number]   MEDICAL RECORD NO.:  [id number]          PATIENT TYPE:  INP   LOCATION:  1429                         [facility]ACILITY:  [facility]   PHYSICIAN:  Domingo Pulse, M.D.  DATE OF BIRTH:  09/07/1951   DATE OF ADMISSION:  01/21/2006  DATE OF DISCHARGE:  01/26/2006                                 DISCHARGE SUMMARY   DISCHARGE DIAGNOSES:  1.  Retroperitoneal abscess.  2.  Chronic obstructive pulmonary disease.  3.  Tobacco use.  4.  Alzheimer's disease.  5.  History of interstitial cystitis, status post cystectomy and neobladder      formation.   HISTORY:  This 60 year old female has a complicated urologic history.  She  is status post cystectomy and neobladder done over 10 years ago for  treatment of end-stage interstitial cystitis.  A number of years later, she  began to develop problems with obstruction on the right-hand side and  initially had that treated endoscopically.  When this reobstructed, she  underwent a ureteral implantation and had problems with persistent ileus and  small bowel obstruction as well as drainage from the Jackson-Pratt drain.  This was felt to be urine based on readings obtained from creatinine, but it  was unclear why this was the case because a nephrostogram showed no leak and  the pouchogram showed no leak.  The drainage became cloudy, and initial  cultures showed yeast.  This was treated by Diflucan but did not get better.  The patient went home but still had problems with drainage.  She was  afebrile, so it was felt this was not an unsafe situation, but as this did  not improve, she is now to have drainage of that area.  It should be noted  that the kidneys were decompressed with bilateral nephrostomy tubes and that  did help to decrease some of the fluid out of that site.   At home, the patient had no problems with her bowel but did have a poor  appetite.  Follow-up CT scans show what appeared to be a 7  cm abscess.  The  patient was brought into the hospital for preparation of I&D in that area.  Since it appeared to be very thick and loculated, it was not felt that  percutaneous drainage would work for her.   The patient's past medical history is remarkable for COPD.  She is sa  longstanding smoker.  She has had chronic headaches.  She is known to have  Alzheimer's and has a history of esophageal reflux disease and irritable  bowel.   HOSPITAL COURSE:  Patient was taken to the operating room, where she  underwent exploration and drainage of that area.  The wound actually had  much less abscess sediment, and I thought this was drying up nicely, but it  still felt appropriate to get this area drained because of the loculated  nature.  It should be noted that the patient received TPN as well as  transfusions in preparations for the procedure.  The patient's postoperative  course was unremarkable,  and she really felt significantly better and  recovered from this much better than she had from the initial surgery.  The  patient had nephrostomy done in order to determine a source of a leak.  The  right side showed wide open passage of the neobladder with no evidence of a  leak.  On the left-hand side, the ureter crossed the midline and then there  was no passage seen going into the neobladder.  This certainly raises the  question of whether the patient had developed obstruction and/or leakage on  that side.  Clearly, the left side was never explored, but this raised  concerns about that left distal ureter.  The patient never had any pain on  the left-hand side, but it is certainly possible that the ureter had become  ischemic and/or obstructed or injured during the exploration of the right  ureter.  The patient ended up having to go home with the left nephrostomy  tube in place with plans made to try and convert that to a double-J after  the patient went home.  The patient had a temperature  increase up to 102.5,  which did delay her discharge, but then she became afebrile.  The patient  had no Jackson-Pratt drainage. those were both discontinued.  Her wound was  clear.  She was able to get rid of her Foley catheter and was able to  restart her in and out catheterization.  When the patient did well with  this, it was felt that she would be ready for discharge.  She was sent home  on Augmentin, based on the cultures, which showed an Enterococcus UTI.  She  was sent home with Tylox for pain.  The patient became afebrile.  She had a  normal bowel movement and was ready for discharge on January 26, 2006.  Long term, she will need to have something done for her left kidney.  The  right system is now patent and the only thing left to do is to get out that  double-J catheter.           ______________________________  Domingo Pulse, M.D.  Electronically Signed     RJE/MEDQ  D:  03/05/2006  T:  03/06/2006  Job:  [id number]

## 2011-04-27 NOTE — Discharge Summary (Signed)
[name], [name]                   ACCOUNT NO.:  [id number]   MEDICAL RECORD NO.:  [id number]          PATIENT TYPE:  INP   LOCATION:  1403                         FACILITY:  [facility]   PHYSICIAN:  Domingo Pulse, M.D.  DATE OF BIRTH:  01-05-1951   DATE OF ADMISSION:  05/23/2005  DATE OF DISCHARGE:  05/27/2005                                 DISCHARGE SUMMARY   DISCHARGE DIAGNOSES:  1.  Pyelonephritis.  2.  Alzheimer's disease.  3.  History of interstitial cystitis, status post cystectomy.   HOSPITAL COURSE:  This 60 year old female was admitted to the hospital for  IV antibiotic therapy because she had pyelonephritis.  The patient had  temperature as high as 102 degrees with associated anorexia and dehydration.  The patient is status post cystectomy and ileal neobladder for the treatment  of her end-stage interstitial cystis.   The patient was started on gentamicin by the pharmacy service.  The patient  had a temperature spike, but then after the antibiotic therapy had started,  this began to decrease.  The patient had much less pain by the first day in  the hospital.  The patient did have an ultrasound which showed no evidence  of any obstruction.  Her temperature was as high as 101.4 on May 25, 2005,  but from that point on, she defervesced nicely with temperatures rarely  rising above 100.  She did have some problems with constipation and was  treated with magnesium citrate.  She was continued on her antibiotic therapy  and was able to be discharged on May 27, 2005.  The patient was sent home  to get Rocephin shots from local physician in [place] and will return to  see me in 10-14 days.     _______________    [id number]  D:  07/10/2005  T:  07/11/2005  Job:  [id number]

## 2011-04-27 NOTE — Procedures (Signed)
[place]  Patient:    [name], [name] Visit Number: [id number] MRN: [id number]          Service Type: OUT Location: [place] Attending Physician:  Harlene Ramus Dictated by:   Orson Eva, M.D. Proc. Date: 10/28/01 Admit Date:  10/28/2001                             Procedure Report  PROCEDURE:  Lumbar puncture.  INDICATION:  Rule out Alzheimers disease.  DESCRIPTION OF PROCEDURE:  After sterile preparation and local anesthesia at the L2-3 interspace, a 20-gauge spinal needle was introduced with minimal difficulty, producing clear CSF and an opening pressure of 180 mmH2O. Approximately 9 cc were collected for routine studies, including tau and A-beta 42 protein.  The patient tolerated the procedure well and without immediate complications. Dictated by:   Orson Eva, M.D. Attending Physician:  Harlene Ramus DD:  10/28/01 TD:  10/28/01 Job: [id number]

## 2011-04-27 NOTE — H&P (Signed)
NAMEEMMILY, LEIBENSPERGER NO.:  [id number]   MEDICAL RECORD NO.:  [id number]          PATIENT TYPE:  INP   LOCATION:  [place]                         FACILITY:  [facility]   PHYSICIAN:  Domingo Pulse, M.D.  DATE OF BIRTH:  10-May-1951   DATE OF ADMISSION:  01/21/2006  DATE OF DISCHARGE:                                HISTORY & PHYSICAL   ADMISSION DIAGNOSIS:  Retroperitoneal abscess status post right ureteral  reimplantation.   HISTORY OF PRESENT ILLNESS:  This 60 year old female has a very complicated  urologic history.  In summary, the patient is status post cystectomy and  neobladder done over 10 years ago.  The patient had this done because of end-  stage interstitial cystitis.  She developed problems with obstruction on the  right-hand side and initially had that treated endoscopically.  It should be  noted this was many years after the initial surgery.  She eventually  reobstructed on that side and underwent reimplantation.  The patient had  problems postoperatively with ileus/small bowel obstruction as well as  persistent drainage out of the right JP.  This was felt to be urine based on  the readings obtained for creatinine, but the source of this was unclear  because a pouchogram showed no leak and nephrostogram showed no leak.  The  persistent drainage eventually became cloudy and initial culture showed  yeast.  She was treated with Diflucan, but the drainage persisted.  It was  felt that this would eventually heal and the patient requested that she be  allowed to go home to see if this would help.  Since she was afebrile and  had a normal white count, it was felt that this would be safe to do.  She  did have her kidneys decompressed with nephrostomy to hopefully dry up the  leak.   At home, the patient had no problems with her bowel, but had a poor appetite  and clearly did not appear to be getting better.  A follow-up CT scan was  read by the radiologist as  showing a 7 cm abscess.  For that reason, the  patient is to be brought into the hospital in preparation for I&D of that  area.  Consideration was given to having the radiologist place this, but  since the patient did not respond even with a surgically placed drain, it  was felt that the material must be very thick and loculated and that a  percutaneous placed drain would not likely work for her.  The patient is  being admitted 1 day in advance for transfusion because she has become  anemic.   PAST MEDICAL HISTORY:  Remarkable for COPD.  She is a longstanding smoker.  She does have chronic headaches and is known to have early Alzheimer's  disease.  She is known to have problems with irritable bowel syndrome as  well as gastroesophageal reflux disease.  The patient has had some problems  with kidney stones primarily on the right-hand side which may be due to the  fact that she is partially obstructed on  the right, although, as noted above  that was recently repaired.  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  The patient is  known to have psoriasis, otherwise her medical history is unremarkable.   SOCIAL HISTORY:  She does smoke 1 pack a day and has done so for 20 years.  She does not use alcohol.   FAMILY HISTORY:  Noncontributory.  We do know the patient has very good  support structure with her husband.   PHYSICAL EXAMINATION:  GENERAL:  She is a very thin female.  As noted above  she is a little bit of a poor historian due to her Alzheimer's.  HEENT:  Normocephalic and atraumatic.  Cranial nerves II-XII grossly intact.  NECK:  Supple.  No adenopathy or thyromegaly.  LUNGS:  Clear.  As noted above, the patient does have some degree of  emphysema, but there were no signs of rales at this time.  HEART:  Regular rate and rhythm with no murmurs, rubs, or gallops.  ABDOMEN:  Soft.  The patient is not obstructed at this time.  She has  multiple incisions in the midline from her previous surgery.   She also has a  right flank incision.  The JP drain in place does show purulence around it.  The nephrostomy tubes appear to be fully functional with no drainage around  either one.  EXTREMITIES:  No cyanosis, clubbing, or edema.  NEUROLOGY:  Vascular system was intact.   IMPRESSION:  Retroperitoneal abscess following right ureteral  reimplantation.   PLAN:  Admit and prep to prepare for I&D of abscess.           ______________________________  Domingo Pulse, M.D.  Electronically Signed     RJE/MEDQ  D:  01/22/2006  T:  01/22/2006  Job:  [id number]

## 2011-04-27 NOTE — Discharge Summary (Signed)
[name], [name]                   ACCOUNT NO.:  [id number]   MEDICAL RECORD NO.:  [id number]          PATIENT TYPE:  INP   LOCATION:  1409                         FACILITY:  [facility]   PHYSICIAN:  Domingo Pulse, M.D.  DATE OF BIRTH:  Nov 15, 1951   DATE OF ADMISSION:  12/21/2005  DATE OF DISCHARGE:  01/11/2006                                 DISCHARGE SUMMARY   DISCHARGE DIAGNOSES:  1.  Right ureteral stricture.  2.  Chronic obstructive pulmonary disease.  3.  Small bowel obstruction.  4.  Postoperative ileus.  5.  Hypovolemia.  6.  Tobacco use.  7.  Alzheimer's disease.  8.  Chronic interstitial cystitis, necessitating cystectomy.   PROCEDURES:  1.  Percutaneous nephrostomy tube placement.  2.  Ureteroneocystostomy.  3.  Insertion of nasogastric tube.  4.  Insertion of venous catheter.   HISTORY:  This is 60 year old female is known to have a distal right  ureteral stricture.  She is status post cystectomy with neobladder formation  a number of years ago for treatment of interstitial cystitis.  The patient  had her distal obstruction treated endoscopically with removal of stone and  dilation of the ureter, but even with a tube in place, the patient had  return of obstruction.  She is now to be admitted for repair of the distal  ureteral stricture.   PAST MEDICAL HISTORY:  Pertinent for previous appendectomy and hysterectomy.  She has had elbow and knee surgery.  She is known to have COPD.  She has  never stopped smoking.  There has been a question of early dementia.  There  is a history of esophageal reflux disease.   Patient's medications at the time of admission were Aricept, Wellbutrin,  Restoril as well as occasional Motrin.  The patient has had no significant  pain since his cystectomy.   Family history, social history, and review of systems are noted on the  initial chart.   The patient was taken to the operating room after appropriate bowel  preparation and  underwent successful reimplantation of the right ureter into  the neobladder.  Her situation was complicated by the fact that there was  adherent bowel and there was a small enterotomy made, although this was  easily repaired.  The patient's early postoperative course was generally  unremarkable.  We had to switch her Dilaudid PCA pump to morphine, and she  seemed to do a little bit better and had flatus within 1-2 days  postoperatively.  Her laboratory studies seemed adequate.  We did note an  increase in drainage from the JP and an aliquot of this was sent to  determine if this was urine.  There was an elevated creatinine, and it was  clear that this was due to a urine leak.  The x-ray shows that the double-J  on the right side was in good place, and it was felt that we would continue  with the J-P until the drainage had decreased to 10 cc.  The patient began  to develop some problems with increasing emesis, and her  abdomen became more  distended, and it was quieter than it had been in the early postoperative  period.  The patient ended up having to have an NG tube placed.  A consult  with general surgery was obtained to determine if there was significant  small bowel obstruction.  The CT scan that was obtained showed the double-J  was in good place.  There was no hydronephrosis.  There were no signs of  bowel leak.  There was no abscess, but there did appear to be evidence of a  small bowel obstruction, and it did appear to be a fluid leak that was  controlled by the patient's drain.  The patient was monitored by general  surgery and with conservative management, there was a decrease in her  distention, and eventually the ileus seemed to stabilize.  The patient's  pain got better.  She still had not had much in the way of flatus, but  eventually that opened up, and the patient did begin to improve from a bowel  standpoint.  It was unclear why the patient still had a significant urine   leak coming out of the J-P drains.  The decision was made to get a  pouchogram, which showed no leak and showed a patent double-J stent.  Because the patient had no leak on the pouchogram, the decision was made to  take the J-P and simply place it to a bag as opposed to a bulb suction,  thinking that perhaps this was pulling fluid directly from the suture line.  The patient began to feel better.  She had a bowel movement and thought that  she was a little closer to getting home.   By January 24, it was clear that the fluid that was draining out of the J-P  had become a little cloudy.  Eventually, this was determined to contain  yeast, and the patient was started on appropriate therapy.  On January 03, 2006, the patient had a return of her emesis with green bilious emesis and a  repeat CT scan was obtained to see if the patient had any evidence of yeast.  A PICC line was inserted so that the patient could be given  hyperalimentation.  The hyperalimentation was started.  The new CT scan was  reviewed by radiology, and it looked like there were air/fluid levels but no  evidence of small bowel obstruction.  The patient's J-P drain did show that  there was retroperitoneal fluid, and the decision was made to place  bilateral nephrostomy tubes to dry up the fluid leak and eventually allow  for the removal of the J-P drain.  Nephrostomy tubes were placed by Dr.  Annamaria Boots, and this did seem to help to decrease the urinary output.  The  patient's drainage did decrease through the combination of bilateral  nephrostomy tubes, the right double-J controlling that ureter, and the Foley  catheter.  The patient's abdomen was soft, and with the diversion of the  urine, actually seemed to improve her ileus.  She was able to be advanced to  a regular diet.  The TPN was tapered, and the patient was prepared for discharge.  The patient did have a decrease in her blood count and had to  receive a little bit of  blood; however, eventually, she was ready for  discharge.   By January 11, 2006, the patient was afebrile.  The culture for the [facility] drain showed mixed flora.  Previously, it had  been yeast versus some  gram positive and gram negative organisms.  The patient at that point really  felt that she needed to go home.  She was having a difficult time with such  a prolonged hospitalization.  She knew that she might require exploration to  drain that area, which was somewhat enlarging in size but she felt that she  needed a few days home to place her house in order.  The decision was made  to allow her to go home even with her low hemoglobin because she was  asymptomatic with the plan being that she would be admitted for additional  blood prior to exploration of that side.  Since the patient had a normal  white count and no fever, it was felt that it would be safe to let her go  home for a few days and then plan to drain the area around the J-P.   The patient was sent home with Tylox for pain, Xanax for anxiety, Diflucan  because she had had yeast in the drainage site, and Levaquin.  She was given  laxative and stool softeners to avoid constipation and is told to return to  the office in several days for followup and to plan a second procedure to  drain that area.  That was an intentionally planned procedure and this time  away from the hospital is simply to allow the patient to take care of some  personal issues.           ______________________________  Domingo Pulse, M.D.  Electronically Signed    RJE/MEDQ  D:  03/05/2006  T:  03/06/2006  Job:  [id number]

## 2011-04-27 NOTE — Op Note (Signed)
NAMECELLIA, REIK                         ACCOUNT NO.:  [id number]   MEDICAL RECORD NO.:  [id number]                   PATIENT TYPE:  AMB   LOCATION:  [facility]                                 FACILITY:  [facility]   PHYSICIAN:  Domingo Pulse, M.D.               DATE OF BIRTH:  [birth year]   DATE OF PROCEDURE:  03/17/2004  DATE OF DISCHARGE:                                 OPERATIVE REPORT   SERVICE:  Urology.   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   PROCEDURE:  Pouchoscopy, pouchogram and antibiotic irrigation.   SURGEON:  Domingo Pulse, M.D.   ANESTHESIA:  General.   COMPLICATIONS:  None.   DRAINS:  None.   BRIEF HISTORY:  This 60 year old female had severe unrelenting interstitial  cystitis. The patient underwent cystectomy and a right colon based continent  urinary diversion. The patient has had excellent bladder capacities but over  the years has developed increasing problems with pain within the pouch.  Based on reports and literature she has been started on irrigation with  heparin and/or Elmiron to try and keep the pouch as clear of debris as  possible. She has still developed problems with pain that is uncontrolled.  The remainder of her evaluation is negative and it appears the pain is  located primarily within the pouch. The patient has a nearly identical form  of diversion to one of our other patient's who has always responded to  cystoscopy under anesthesia with resolution of his pouch pain.  In addition,  both this patient and others within the literature have shown improvement in  their symptoms with antibiotic irrigation. For that reason, the patient is  now to undergo diagnostic cystoscopy with filling of the pouch to assess  capacity,  pouchogram to look for reflux or leaks and antibiotic irrigation.  The patient understands the risks and benefits of the procedure and gave  full informed consent.   DESCRIPTION OF PROCEDURE:  After successful induction of general anesthesia,  the patient was placed in the dorsal lithotomy position, prepped with  Betadine and draped in the usual sterile fashion.  Careful bimanual  examination revealed a normal urethra, there was no signs of any  diverticulum. The patient actually has reasonable bladder length given the  fact that she has had both a hysterectomy and a cystectomy and no cystocele,  rectocele or enterocele was detected. The cystoscope was inserted, the pouch  was inspected, no mucus could be seen, no stones could be seen. The mucosa  was unremarkable with filling, some bleeding was seen. It had a somewhat  more inflamed appearance than we normally see in endoscopy of an [place]  type pouch.  The suture lines were all identified and intact. The bladder  fill was stopped at 1000  mL. This clearly was not the entire capacity but it  was felt that the patient might be at some risk for a pouch rupture if she  was filled any higher. The bladder was drained, a cystogram was performed  and the bladder was intact, there was no reflux and there was a nice normal  smooth contour to the appearance.  The bladder was then drained and  carefully lavaged with 1 liter of antibiotic solution based on the recent  literature reports indicating this may cut down on bacteria within the pouch  and be helpful in the overall decrease in pouch pain. The decision was made  to leave the Foley catheter over the weekend so that the patient will be  more comfortable and this can be removed next week.  Pyridium was left  within the bladder. A Marcaine and Kenalog injection was performed  periurethrally. The patient tolerated the procedure well and was taken to  the recovery room in good condition.                                               Domingo Pulse, M.D.     RJE/MEDQ  D:  03/17/2004  T:  03/17/2004  Job:  [id number]

## 2011-04-27 NOTE — Op Note (Signed)
[name], [name]                   ACCOUNT NO.:  [id number]   MEDICAL RECORD NO.:  [id number]          PATIENT TYPE:  INP   LOCATION:  [place]                         FACILITY:  [facility]   PHYSICIAN:  Domingo Pulse, M.D.  DATE OF BIRTH:  Nov 13, 1951   DATE OF PROCEDURE:  03/25/2006  DATE OF DISCHARGE:                                 OPERATIVE REPORT   PREOPERATIVE DIAGNOSIS:  Obstructive left ureter status post right colon  neobladder urinary diversion.   POSTOPERATIVE DIAGNOSIS:  Obstructive left ureter status post right colon  neobladder urinary diversion.   PROCEDURE:  Exploratory laparotomy and reimplantation of left ureter into  neobladder.   SURGEON:  Domingo Pulse, M.D.   ASSISTANT:  Lowella Bandy. Sami Lincoln   ANESTHESIA:  General.   COMPLICATIONS:  None.   DRAIN:  Is an 1 French Foley catheter, a Jackson-Pratt drain and a 6 French  double-J catheter in left ureter.   BRIEF HISTORY:  This patient suffered for many years from end-stage  interstitial cystitis and then she got to the point where she was completely  incapacitated by her symptoms.  She underwent urinary diversion.  Her  urethra was sounded and she underwent right cone-based diversion down to the  urethra.  This was done over 10 years ago and at that time it was generally  recommended that a nonrefluxing ureteral anastomosis be performed, putting  the ureter directly into a pinea.  For many years the patient did well but a  few years developed obstruction on the right hand side.  This was first  treated endoscopically, but it became clear that the distal ureter did not  drain well and the patient eventually underwent reimplantation on the right  hand side.  This was done through a low flank incision in order to avoid  injury to the bowel contents.  The patient has had a double-J stent on that  side.  In the postoperative period, she is known to have somewhat persistent  urinary drainage through the  Jackson-Pratt, eventually bilateral nephrostomy  tubes were placed.  At that time an attempt was made to place a double-J  catheter on the left hand side to get better control of the urinary system  and it was noted that there was obstruction.  Preoperatively, the patient  had never had any problems on the left hand side and it is certainly felt  that the procedure itself to reimplant the right hand side may in some way  cause derangement in the blood supply to the distal left ureter.  Alternatively, a retractor could have caused a problem.  In any case, the  patient has had a left nephrostomy tube in place.  She did have to undergo  placement of a drain to remove some of the urinoma that had become infected  in the postoperative period, but she is fully recovered on the right hand  side and has had a good result.  That double-J has been left in place in  order to identify the right ureter for this procedure, but is due to  be  removed.   The patient presents to the operating room with the right double-J into the  neobladder and a left nephrostomy tube with a ureter that is obstructed down  to distal end.  The patient is now to undergo reimplantation of the ureter.  She understands the risks and benefits of the procedure and gave full  informed consent.   PROCEDURE:  After successful induction of general anesthesia, the patient  was placed in the low lithotomy position, prepped with Betadine and draped  in the usual sterile fashion.  This prep included the vaginal area.  An 62  French Silastic Foley was inserted.  The patient has a Latex allergy and all  instruments were Latex free.  This was used during the procedure to aid in  identification of the neobladder.  A lower midline incision was made and  very careful dissection was used to expose the fascia.  The patient had a  superficial wound infection and the area was very scarred, but the fascia  turned out to be in good shape and was  intact.  The abdomen was carefully  entered.  The omentum was carefully dissected off the underside of the  incision, allowing entry into the abdomen.  The left colon was then  reflected medially, getting entry into the space of Retzius.  The external  iliac artery was easily identified.  The inferior mesenteric vein was  identified and the ureter was eventually found adjacent to that.  This was  dissected free from the retroperitoneum both lateral to the left colon and  then medially when it came down close to the neobladder.  This was dissected  all the way down to the sacral promontory, at which point it was clipped and  divided.  It was felt that the distal ureter most likely had obstruction and  did not need to be stented all the way to the neobladder.  The ureter was  then spatulated.  In order to be absolutely certain that the ureter was  implanted in appropriate position, an opening large enough to place a finger  was made in the neobladder.  This was identified by filling the neobladder  in order to ensure that no other small or large bowel was touched.  The  small bowel that was down in the pelvis was carefully dissected away from  the neobladder.  The opening in the neobladder allowed for removal of the  right-sided double-J.  The ureter was spatulated, a new double-J was placed  on the left hand side and the anastomosis made with #4-0 Vicryl and then #3-  0 Vicryl to close the defect.  The patient had a Jackson-Pratt drain placed  towards the area of the anastomotic site.  Camera inspection showed the left  colon had not been injured, the small bowel had not been damaged as it was  resected, the neobladder was not injured in any way, and there was good  closure.  The patient does have a capacious pouch with a capacity well over  1000 mL and now that both ureters have been reanastomosed in a fishmouth fashion, it is anticipated that she should not have any significant long  term  problems.  If she does, certainly the long term plan will be for serial  double-J catheter exchanges if needed.  The incision was then closed with a  series of #1 Prolene sutures.  The previously scarred skin was then freed up  and actually a much more cosmetic  closure was obtained as was done with  surgical clips.  The patient tolerated the procedure and was taken to the  recovery room in good condition.           ______________________________  Domingo Pulse, M.D.  Electronically Signed     RJE/MEDQ  D:  03/25/2006  T:  03/25/2006  Job:  [id number]

## 2011-04-27 NOTE — Op Note (Signed)
[place]  Patient:    [name], [name]                      MRN: [id number] Proc. Date: 08/06/00 Adm. 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  PROCEDURE: 1. ______ and slide procedure with distal tubercleplasty right knee. 2. Proximal realignment, Insall type, with lateral release.  SURGEON:  Maia Breslow, M.D.  ASSISTANT:  Grace Bushy. Moye, P.A.-C.  ANESTHESIA:  General endotracheal.  CULTURES:  None.  DRAINS:  None.  ESTIMATED BLOOD LOSS:  Minimal.  TOURNIQUET TIME:  62 minutes.  PATHOLOGIC FINDINGS AND HISTORY:  [name] had a knee scope in [id number], with medial plica and lateral release.  She had repeat procedure July 26, 1998, two years ago when she had a medial plica recurrence.  She had a tight lateral retinaculum which was scarred down and one cleft of chondromalacia patella. We took out the medial plica and repeated the lateral release.  She then did reasonably well postoperatively, initially had a weak quadricep but that ultimately that cleared.  She came back to the office July 15, 2000, with right knee pain, 2+ patellofemoral crepitation, and negative McMurrays.  She still tracked a bit laterally on the knee range of motion.  X-rays showed thinness of patellofemoral cartilage with some patellar tilt. She is also having some back problems and so I diagnostically and therapeutically injected the medial portal of her right knee with cortisone and Marcaine, and she did have some relief with that.  We also worked up her back with the groin and back pain to make sure that it was not contributing to the knee.  An MRI scan lumbar was normal.  The pain was retropatellar going up and down stairs in the front of the knee, especially  when she bent the knee or put weight on it descending stairs.  She had spasm in her hamstrings, painful patellofemoral grind, tender around the patella.  I felt at this point, Hyalgan would fail and this being her second operation with lateral release on the knee I elected to go with a ______ and slide.  At surgery she still had the small cleft at the posterior patellar ridge but the rest of the patella was intact.  Once the Insall procedure was done and she was medialized she was flat with respect to tilting in the trochlea, and the lateral release that we performed today did remain open indicating that once moved over she had been very tight before and now gaped open.  The ______ and slide was anteriorized about 8 mm to 10 mm and medialized 8 to 10 mm.  Two 4.5 cannulated cancellus screws were used for fixation, C-arm fluoroscopy confirmed positioning and auto bone grafting carried out underneath the tubercleplasty.  We could not use bigger screws because she is such a tiny person.  There was also some white scar pannus on the posterior fat pad which I excised thinking that could be tightening her down and somewhat painful.  The trochlea looked good.  PROCEDURE:  With adequate anesthesia obtained using endotracheal technique, 1 gram Ancef given IV prophylaxis, the patient was placed in the supine position.  Her right lower extremity was prepped from the malleoli to the tourniquet in a standard fashion.  After standard prepping and draping Esmarch exsanguination was used and the tourniquet was let up to 300 then later 350 mmHg.  A curvilinear skin incision was then made medially, proximally, and lateral distally over the tubercle to allow for the medialization and anteriorization under the skin flap medially distally.  The incision was deepened with an operative knife and hemostasis obtained using the Bovie electrocoagulator.  Dissection was carried down to the patellar tendon  which was incised longitudinally and dissected off the medial aspect of the patella and just proximal and distal to the patella.  The patellofemoral joint was then opened to the synovium medially.  The patella everted and inspected.  No debridement was necessary with the above listed findings.  I then turned attention distally where 5, 0.62 K-wires were used to be placed in an oblique fashion across the tubercle from anterior medial to posterior lateral.  I then used a oscillating saw to cut the osteotomy of the tibial tubercle ______ and slide.  I then cracked through with an osteotome.  I then transferred the proximal tibial tubercle on its pedicle medial and anterior on the oblique cut.  Some bone laterally from the cancellus bone was morcellized for bone graft and packed underneath the ______ and slide osteotomy.  I then took 2, 4.5 cannulated cancellous screw guide pins and placed them across the osteotomy in through the posterior cortex.  Drilling carried out, counter- sinking, tap and then one 50 ml, and one 40 mL, 4.5 cannulated cancellous screws were placed to fix the tubercle plasty ______  and slide in place. Washers were not used.  The knee was able to be flexed to 90 degrees.  We then closed the wound in layers leaving the lateral retinacular release open using interrupted figure-of-eight 1 Vicryls on the Insall procedure where the medial leaf tendon was brought somewhat lateral medializing the tendon proximally, and then distally closing over the osteotomy site laterally.  I then oversewed with a running 0 PDS.  The subcuticular was then closed in layers with 0 and 3-0 Vicryl and the skin with staples.  A bulky sterile compressive dressing was applied with knee immobilizer and the patient having tolerated the procedure well was taken to the recovery room in satisfactory condition to be admitted for routine postoperatively care, CPM 0-35.  Discharge tomorrow with return next  week.  She was given [place] and Percocet for pain. DD:  08/06/00 TD:  08/07/00 Job: [id number]

## 2011-04-27 NOTE — H&P (Signed)
[name], [name] NO.:  [id number]   MEDICAL RECORD NO.:  [id number]          PATIENT TYPE:  INP   LOCATION:  [id number]                         FACILITY:  [facility]   PHYSICIAN:  Domingo Pulse, M.D.  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   HISTORY OF PRESENT ILLNESS:  This 60 year old female has a complicated  urologic history.  The patient had end-stage interstitial cystitis and  underwent cystectomy and formation of a right colon-based neobladder over 10  years ago.  For many years, she had no problems but then began to develop  some problems with obstruction on the right-hand side.  This was first  treated endoscopically, but then when she became reobstructed, she underwent  ureteral implantation.  The patient had persistent ileus and small-bowel  obstruction as well as drainage from the Jackson-Pratt drain.  This was felt  to be urine, and for that reason, a nephrostomy tube was placed on each  side.  The nephrostogram and pathograms did not show a leak, but when the  urine was diverted, the distal left ureter became obstructed, as there was  no double-J in that area.  The patient eventually had to have a drain placed  to remove some adipose tissue from around the right ureteral implant, but  she has done quite nicely on the right-hand side and has fully healed.  She  is readmitted following that procedure.   PAST MEDICAL HISTORY:  1.  COPD.  She is a longstanding smoker.  2.  Early Alzheimers  disease.  3.  Esophageal reflux disease.  4.  Irritable bowel syndrome.  5.  Problems in the past with kidney stones, mostly on her right-hand side      and has had those treated endoscopically.  6.  Psoriasis.   SOCIAL HISTORY:  Pertinent for smoking one pack of cigarettes daily which  she  has done for 20.  She does not use alcohol.   FAMILY HISTORY:  Noncontributory.   REVIEW OF SYSTEMS:  Unremarkable, with the exception of the problems with  her psoriasis.   PHYSICAL EXAMINATION:  HEART:  Regular rate and rhythm with no murmurs,  rubs, or gallops.  ABDOMEN:  Soft and nontender with no palpable masses, rebound, or guarding.  The patient's right side has healed up nicely.  Her lower midline incision  did heal in the past by second intent, and there was some scarring in the  area.  The fascia appears to be intact.  Vaginal examination shows excellent support from the neobladder.  There is  no cystocele, rectocele, or enterocele seen.  EXTREMITIES:  No clubbing, cyanosis, or edema.  NEUROLOGIC:  Intact.   IMPRESSION:  Obstructed left ureter into neobladder.  Plan is exploratory  laparotomy and reimplantation.           ______________________________  Domingo Pulse, M.D.  Electronically Signed  RJE/MEDQ  D:  03/25/2006  T:  03/25/2006  Job:  [id number]   cc:   Teressa Lower  Fax: [phone]

## 2011-04-27 NOTE — H&P (Signed)
[name], [name]                   ACCOUNT NO.:  [id number]   MEDICAL RECORD NO.:  [id number]          PATIENT TYPE:  INP   LOCATION:  [place]                         FACILITY:  [facility]   PHYSICIAN:  Domingo Pulse, M.D.  DATE OF BIRTH:  05-Apr-1951   DATE OF ADMISSION:  12/21/2005  DATE OF DISCHARGE:                                HISTORY & PHYSICAL   ADMISSION DIAGNOSIS:  Distal right ureteral stricture.   HISTORY:  This 60 year old female has a distal right ureteral stricture. She  is status post cystectomy with neobladder formation. The patient did have  some obstruction from a possible stone and underwent endoscopic procedures  earlier this year. The stone was removed, the distal ureter was dilated in  late 2005 and eventually the stent was removed in early 2006.  The patient  began to have problems and then she required and new nephrostomy tube to be  placed. The patient is now to be admitted for repair of the distal ureteral  stricture.   PAST MEDICAL HISTORY:  The patient's past history is pertinent for previous  appendectomy and hysterectomy. She also had elbow and knee surgery. She has  no cardiac illnesses. She is known to have COPD and does currently smoke.  There is some question of early Alzheimer's disease. She also has a past  history of esophageal reflux and spastic colon. The patient is also known to  have psoriasis.   MEDICATIONS ON ADMISSION:  Aricept, Wellbutrin, Restoril, and occasional  Motrin. She has been free of pain medicine since the cystectomy was  performed aside from those times when she has some problems with her  ureteral stricture.   SOCIAL HISTORY:  Pertinent for the fact the patient does still smoke a pack  of cigarettes a day and has done so for 20 years. She has been appraised of  the potential risk for carcinoma. She does not use alcohol.   FAMILY HISTORY:  Noncontributory.   REVIEW OF SYSTEMS:  Remarkable for the early Alzheimer's disease  but  otherwise is unremarkable.   PHYSICAL EXAMINATION:  GENERAL:  Well-developed, well-nourished, slender,  white female in no acute distress.  HEENT:  Normocephalic, atraumatic. Cranial nerves II-XII are grossly intact.  NECK:  Supple, no adenopathy or thyromegaly.  LUNGS:  Clear.  HEART:  Regular rate and rhythm without murmurs, thrills, gallops, rubs or  heaves.  BREASTS:  Not checked.  ABDOMEN:  Soft. The patient does have a double-J catheter in place and is  pain free since that was inserted. The patient has some widening of the  incision in the lower portion of the midline but otherwise her abdominal  incision has healed up nicely.  EXTREMITIES:  No cyanosis, clubbing or edema.   IMPRESSION:  Distal right ureteral stricture status post cystectomy and  neobladder formation.   PLAN:  Right ureteral reimplantation.           ______________________________  Domingo Pulse, M.D.  Electronically Signed     RJE/MEDQ  D:  12/21/2005  T:  12/22/2005  Job:  [id number]

## 2011-04-27 NOTE — H&P (Signed)
[name], [name]                   ACCOUNT NO.:  [id number]   MEDICAL RECORD NO.:  [id number]          PATIENT TYPE:  INP   LOCATION:  1403                         FACILITY:  [facility]   PHYSICIAN:  Domingo Pulse, M.D.  DATE OF BIRTH:  06/28/1951   DATE OF ADMISSION:  05/23/2005  DATE OF DISCHARGE:  05/27/2005                                HISTORY & PHYSICAL   REASON FOR ADMISSION:  Right pyelonephritis.   HISTORY:  This 60 year old female is well known to me.  She is status post  cystectomy and ileal neobladder.  The patient presented to the office with  right sided pain.  She had an IVP that was negative for stones but because  her temperature increased to 102 degrees, it was felt that she required  hospital admission.  The patient has anorexia and dehydration.   PAST MEDICAL HISTORY:  Some degree of Alzheimer disease.   MEDICATIONS AT TIME OF ADMISSION:  Restoril, Aricept, and Lexapro.   The patient's family history, social history, review of systems have been  delineated in the hospital chart many times and are noncontributory.   PHYSICAL EXAMINATION:  GENERAL:  The patient is a well-developed, well-  nourished female quite ill-appearing due to her right sided abdominal pain.  HEENT:  Normocephalic and atraumatic.  NECK:  Cranial nerves II-XII are grossly intact.  Neck was supple.  No  adenopathy or thyromegaly.  LUNGS:  Clear.  HEART:  Regular rate and rhythm.  No murmurs, thrills, gallops, rubs, or  heaves.  ABDOMEN:  Soft, nontender with the exception of the right side which is  painful.  There was no real rebound or guarding.  EXTREMITIES:  Have no cyanosis, clubbing, or edema.   IMPRESSION:  Right pyelonephritis in a patient with chronic urinary  diversion.   PLAN:  1.  Admit, following IV antibiotic therapy.  2.  An ultrasound to check and see if she has any obstruction.  3.  The patient will have blood and urine cultures obtained.     _______________    RJE/MEDQ  D:  07/10/2005  T:  07/10/2005  Job:  [id number]   cc:   Domingo Pulse, M.D.  [address] [place]  [place] [address]  Fax: [phone]

## 2011-04-27 NOTE — Op Note (Signed)
[name], [name]                   ACCOUNT NO.:  [id number]   MEDICAL RECORD NO.:  [id number]          PATIENT TYPE:  AMB   LOCATION:  [facility]                         FACILITY:  [facility]   PHYSICIAN:  Domingo Pulse, M.D.  DATE OF BIRTH:  May 13, 1951   DATE OF PROCEDURE:  01/02/2005  DATE OF DISCHARGE:                                 OPERATIVE REPORT   PREOPERATIVE DIAGNOSIS:  Retained right double-J catheter (stone  encrustation).   POSTOPERATIVE DIAGNOSIS:  Retained right double-J catheter (stone  encrustation).   PROCEDURES:  1.  Cystoscopy.  2.  Removal of double-J catheter.   SURGEON:  Domingo Pulse, M.D.   ANESTHESIA:  General.   COMPLICATIONS:  None.   DRAINS:  None.   BRIEF HISTORY:  This 60 year old female underwent cystectomy and continent  urinary diversion to the urethra a number of years ago.  The patient has  done quite well following that procedure, but recently developed some pain  on the right-hand side.  She was found to have a stricture in the distal  right ureter.  The patient had a nephrostomy tube inserted, had the distal  area balloon dilated and had a double-J stent left in place.   The patient was to undergo removal of the stent in the office, but developed  problems with the stent.  Cystoscopy in the office showed that there was  stone encrustation on the double-J, making it impossible to remove in the  office.  Arrangements were made for the patient to undergo nephrostomy tube  placement to decompress the right kidney.  The patient is now to undergo  removal of the double-J catheter.  The patient understands the risks and  benefits of the procedure.  She would like to get the nephrostomy tube out  as soon as possible, but realizes it will be left in place until we are  certain that the ureter is now patent.  She gave full informed consent.  It  should be noted that there was a controversy prior to the initiation of the  procedure.  The nursing  staff had been told that if there was any  bilaterality that the patient must be marked.  However, they were informed  that there was no laterality to this incision because the plan was to remove  the existing double-J and clearly the patient does not have a J on both  sides.  The patient will not require any additional imaging studies.  For  that reason, it was felt that the area did not need to be marked prior to  the procedure.  The patient gave full informed consent for removal of her  existing double-J.   DESCRIPTION OF PROCEDURE:  After successful induction of general anesthesia,  the patient was placed in the dorsolithotomy position, prepped with Betadine  and draped in the usual sterile fashion.  Cystoscopy was performed.  The  neobladder was carefully inspected and it was intact with no signs of any  abnormalities within the pouch itself.  The double-J catheter was  identified.  It was completely encrusted  with stone.  The stent was grasped  and removed.  A ureteral catheter was used to probe the opening to ensure  that it was patent and it clearly was.  In order to not run afoul of the  hospital administration, no retrograde was performed because the patient had  not been marked for  laterality.  For that reason, the procedure was left as cystoscopy and  removal of preexisting double-J catheter.  The patient tolerated the  procedure well and was taken to the recovery room in good condition.  Her  nephrostomy tube will be left in place to drain until she can have a  nephrostogram performed.      RJE/MEDQ  D:  01/02/2005  T:  01/02/2005  Job:  [id number]   cc:   Gardiner Fanti   Angie Greggory Stallion Filipo   Alese Jugtown

## 2011-09-19 LAB — DIFFERENTIAL
Basophils Absolute: 0.1
Basophils Relative: 1
Eosinophils Absolute: 0.1
Eosinophils Relative: 1
Lymphocytes Relative: 27
Lymphs Abs: 3.2
Monocytes Absolute: 0.9 — ABNORMAL HIGH
Monocytes Relative: 8
Neutro Abs: 7.5
Neutrophils Relative %: 63

## 2011-09-19 LAB — CBC
HCT: 35.1 — ABNORMAL LOW
Hemoglobin: 12
MCHC: 34.2
MCV: 82.1
Platelets: 157
RBC: 4.28
RDW: 13.8
WBC: 11.9 — ABNORMAL HIGH

## 2011-09-19 LAB — URINE MICROSCOPIC-ADD ON

## 2011-09-19 LAB — URINALYSIS, ROUTINE W REFLEX MICROSCOPIC
Bilirubin Urine: NEGATIVE
Glucose, UA: NEGATIVE
Ketones, ur: NEGATIVE
Leukocytes, UA: NEGATIVE
Nitrite: NEGATIVE
Protein, ur: NEGATIVE
Specific Gravity, Urine: 1.015
Urobilinogen, UA: 0.2
pH: 6

## 2011-09-19 LAB — COMPREHENSIVE METABOLIC PANEL WITH GFR
ALT: 13
AST: 15
Albumin: 3.3 — ABNORMAL LOW
Alkaline Phosphatase: 66
BUN: 28 — ABNORMAL HIGH
CO2: 23
Calcium: 8.5
Chloride: 107
Creatinine, Ser: 1.19
GFR calc non Af Amer: 47 — ABNORMAL LOW
Glucose, Bld: 103 — ABNORMAL HIGH
Potassium: 4.1
Sodium: 138
Total Bilirubin: 0.6
Total Protein: 6.3

## 2011-09-19 LAB — URINE CULTURE

## 2012-12-09 DIAGNOSIS — G8929 Other chronic pain: Secondary | ICD-10-CM | POA: Insufficient documentation

## 2012-12-11 DIAGNOSIS — Z79891 Long term (current) use of opiate analgesic: Secondary | ICD-10-CM | POA: Insufficient documentation

## 2012-12-11 DIAGNOSIS — M545 Low back pain, unspecified: Secondary | ICD-10-CM | POA: Insufficient documentation

## 2012-12-11 DIAGNOSIS — F17200 Nicotine dependence, unspecified, uncomplicated: Secondary | ICD-10-CM | POA: Insufficient documentation

## 2012-12-11 DIAGNOSIS — M25551 Pain in right hip: Secondary | ICD-10-CM | POA: Insufficient documentation

## 2013-03-17 ENCOUNTER — Encounter (HOSPITAL_COMMUNITY): Payer: Self-pay | Admitting: *Deleted

## 2013-03-17 ENCOUNTER — Emergency Department (HOSPITAL_COMMUNITY): Payer: Medicare Other

## 2013-03-17 ENCOUNTER — Emergency Department (HOSPITAL_COMMUNITY)
Admission: EM | Admit: 2013-03-17 | Discharge: 2013-03-17 | Disposition: A | Discharge status: Home / Self Care | Payer: Medicare Other | Attending: Emergency Medicine | Admitting: Emergency Medicine

## 2013-03-17 DIAGNOSIS — Z87442 Personal history of urinary calculi: Secondary | ICD-10-CM | POA: Insufficient documentation

## 2013-03-17 DIAGNOSIS — Y929 Unspecified place or not applicable: Secondary | ICD-10-CM | POA: Insufficient documentation

## 2013-03-17 DIAGNOSIS — F17200 Nicotine dependence, unspecified, uncomplicated: Secondary | ICD-10-CM | POA: Insufficient documentation

## 2013-03-17 DIAGNOSIS — W19XXXA Unspecified fall, initial encounter: Secondary | ICD-10-CM

## 2013-03-17 DIAGNOSIS — W010XXA Fall on same level from slipping, tripping and stumbling without subsequent striking against object, initial encounter: Secondary | ICD-10-CM | POA: Insufficient documentation

## 2013-03-17 DIAGNOSIS — IMO0002 Reserved for concepts with insufficient information to code with codable children: Secondary | ICD-10-CM | POA: Insufficient documentation

## 2013-03-17 DIAGNOSIS — Y9389 Activity, other specified: Secondary | ICD-10-CM | POA: Insufficient documentation

## 2013-03-17 HISTORY — DX: Disorder of kidney and ureter, unspecified: N28.9

## 2013-03-17 LAB — URINALYSIS, ROUTINE W REFLEX MICROSCOPIC
Ketones, ur: 15 mg/dL — AB
Nitrite: NEGATIVE
Protein, ur: >300 mg/dL — AB
Urobilinogen, UA: 0.2 mg/dL (ref 0.0–1.0)

## 2013-03-17 NOTE — ED Notes (Signed)
Per pt and son at bedside pt fell x1 last week and again this am approx 0915. Pt states she turned too quickly and her feet went out from under her. Pt reports she landed on her tailbone, denies hitting her head or N/V. Pt's son states she was "dazed." pt states she takes alzheimer medication and feels sometimes it too strong for her. Pt MAE, a/o x4

## 2013-03-17 NOTE — ED Notes (Signed)
Pt has to catheterize herself when using the restroom.

## 2013-03-17 NOTE — ED Provider Notes (Signed)
History     CSN: [id number]  Arrival date & time 03/17/13  1154   First MD Initiated Contact with Patient 03/17/13 1233      Chief Complaint  Patient presents with  . Fall    (Consider location/radiation/quality/duration/timing/severity/associated sxs/prior treatment) Patient is a 62 y.o. female presenting with fall.  Fall   Level 5 caveat due to dementia Pt reports she fell twice yesterday. Son at the bedside provides a more clear history. She had a fall about 2 weeks ago and then he found her sitting on the floor of the bathroom earlier today. She is complaining of moderate aching pain to buttocks bilaterally. Has been able to stand and bear weight since the fall. No head injury today. She has otherwise been in her normal state of health. This was an unwitnessed fall, pt states she lost her balance.   Past Medical History  Diagnosis Date  . Renal disorder     kidney stones    Past Surgical History  Procedure Laterality Date  . Abdominal hysterectomy    . Knee surgery      No family history on file.  History  Substance Use Topics  . Smoking status: Current Every Day Smoker -- 1.00 packs/day    Types: Cigarettes  . Smokeless tobacco: Not on file  . Alcohol Use: No    OB History   Grav Para Term Preterm Abortions TAB SAB Ect Mult Living                  Review of Systems Unable to assess due to mental status.   Allergies  Codeine  Home Medications  No current outpatient prescriptions on file.  BP 143/74  Pulse 101  Resp 16  SpO2 95%  Physical Exam  Nursing note and vitals reviewed. Constitutional: She appears well-developed and well-nourished.  HENT:  Head: Normocephalic and atraumatic.  Eyes: EOM are normal. Pupils are equal, round, and reactive to light.  Neck: Normal range of motion. Neck supple.  Cardiovascular: Normal rate, normal heart sounds and intact distal pulses.   Pulmonary/Chest: Effort normal and breath sounds normal.  Abdominal:  Bowel sounds are normal. She exhibits no distension. There is no tenderness.  Musculoskeletal: Normal range of motion. She exhibits no edema and no tenderness.  Neurological: She is alert. She has normal strength. No cranial nerve deficit or sensory deficit.  Skin: Skin is warm and dry. No rash noted.  Psychiatric: She has a normal mood and affect.    ED Course  Procedures (including critical care time)  Labs Reviewed  URINALYSIS, ROUTINE W REFLEX MICROSCOPIC - Abnormal; Notable for the following:    APPearance TURBID (*)    Hgb urine dipstick LARGE (*)    Ketones, ur 15 (*)    Protein, ur >300 (*)    Leukocytes, UA LARGE (*)    All other components within normal limits  URINE MICROSCOPIC-ADD ON - Abnormal; Notable for the following:    Bacteria, UA MANY (*)    Crystals TRIPLE PHOSPHATE CRYSTALS (*)    All other components within normal limits  URINE CULTURE   Dg Pelvis 1-2 Views  03/17/2013  *RADIOLOGY REPORT*  Clinical Data: Fall, sacral pain  PELVIS - 1-2 VIEW  Comparison: None.  Findings: Single frontal view of the pelvis submitted.  No gross fracture or subluxation.  The study is limited by stool and gas within rectosigmoid colon.  There is a left ureteral stent in the upper pelvis with tip in  the right upper pelvis.  Postsurgical changes with multiple surgical clips.  IMPRESSION: No gross fracture or subluxation.  The study is limited by stool and gas within rectosigmoid colon.  There is a left ureteral stent in the upper pelvis with tip in the right upper pelvis. Postsurgical changes with multiple surgical clips.   Original Report Authenticated By: Lahoma Crocker, M.D.    Dg Sacrum/coccyx  03/17/2013  *RADIOLOGY REPORT*  Clinical Data: Fall, sacral pain  SACRUM AND COCCYX - 2+ VIEW  Comparison: 12/15/2012  Findings: Three views of the sacrum and coccyx submitted. There is ureteral stent in the upper pelvis. There is angulation of the ureteral stent since prior exam.  Clinical correlation  is necessary.  Frontal view is limited by stool in the rectosigmoid colon.  No gross fracture or subluxation.  Postsurgical changes within pelvis are stable.  IMPRESSION: No gross fracture or subluxation.  Suboptimal exam as described above.There is ureteral stent in the upper pelvis. There is angulation of the ureteral stent since prior exam.  Clinical correlation is necessary.   Original Report Authenticated By: Lahoma Crocker, M.D.      1. Fall, initial encounter       MDM  EKG done in triage  Date: 03/17/2013  Rate: 83  Rhythm: normal sinus rhythm  QRS Axis: normal  Intervals: normal  ST/T Wave abnormalities: normal  Conduction Disutrbances:none  Narrative Interpretation:   Old EKG Reviewed: none available  Pt apparently gave a urine sample which looks cloudy. No UTI symptoms but will send for UA.   2:36 PM Xrays neg. Pt's son states she has artificial bladder, self caths and is currently finishing Abx for UTI. Will send for culture but avoid restarting anything now. Has followup with her doctor in [place] next week.        Korry Dalgleish B. Karle Starch, MD 03/17/13 1438

## 2013-03-17 NOTE — ED Notes (Signed)
Pt dc'd home w/all belongings, alert and ambulatory, pt verbalizes understanding of dc instructions, driven home by son, no narcotics given in ED

## 2013-03-17 NOTE — ED Notes (Signed)
Pt states she turned, lost her balance and fell 2 yesterday, taking the impact on her tailbone. Pt states that her son states she passed out shortly the first time she fell b/c she hit her head and her tailbone.  AO x 4.  Pt appears tired, states she's been in hospital with her grandson x 4 days.  Denies being on coumadin, etc.

## 2013-03-17 NOTE — ED Notes (Signed)
Patient transported to X-ray 

## 2013-03-18 LAB — URINE CULTURE
Colony Count: NO GROWTH
Culture: NO GROWTH

## 2013-03-19 ENCOUNTER — Telehealth (HOSPITAL_COMMUNITY): Payer: Self-pay | Admitting: Emergency Medicine

## 2013-06-23 DIAGNOSIS — N261 Atrophy of kidney (terminal): Secondary | ICD-10-CM | POA: Insufficient documentation

## 2013-06-25 ENCOUNTER — Telehealth: Payer: Self-pay | Admitting: Neurology

## 2013-06-25 NOTE — Telephone Encounter (Signed)
Dr dough wants this patient to be seen for a new seizure problem, she was hospitalized at [facility] . Is on dilantin. Appointment with me or caroly in the next 2-3 weeks , please.

## 2013-06-25 NOTE — Telephone Encounter (Signed)
Physician assistant with Dr. Garlon Hatchet needs someone to call her for f/u per Dr. Garlon Hatchet w/[facility] [phone]. I did put pt on waiting list but needs to get in sooner per her physician. Thanks

## 2013-06-25 NOTE — Telephone Encounter (Signed)
Physician assistant with Dr. Garlon Hatchet needs someone to call her for f/u per Dr. Garlon Hatchet w/[facility] [phone]. I did put pt on waiting list but needs to get in sooner per her physician. Thanks

## 2013-06-29 ENCOUNTER — Telehealth: Payer: Self-pay | Admitting: Neurology

## 2013-07-01 ENCOUNTER — Telehealth: Payer: Self-pay | Admitting: Neurology

## 2013-07-01 MED ORDER — PHENYTOIN SODIUM EXTENDED 100 MG PO CAPS: 200.0000 mg | ORAL_CAPSULE | Freq: Every day | ORAL | Status: DC

## 2013-07-01 NOTE — Telephone Encounter (Signed)
Patient has appt in Nov.  Needs enough Dilantin to last until then.

## 2013-07-01 NOTE — Telephone Encounter (Signed)
Patient called back, a new phone call was created.  See next note.

## 2013-07-01 NOTE — Telephone Encounter (Signed)
I still need to verify what dose and directions the patient is taking.  I called back.  Spoke with Mr. Hampel.  He said she is taking Phenytoin 100mg  2 hs.  He would like the Rx sent to [facility]  I offered to send short supply to a local pharmacy while they waited on mail order, but he declined.

## 2013-07-01 NOTE — Telephone Encounter (Signed)
Consulted Dr Brett Fairy.  She authorized me to send refills until appt.

## 2013-07-09 ENCOUNTER — Other Ambulatory Visit: Payer: Self-pay

## 2013-07-09 ENCOUNTER — Other Ambulatory Visit: Payer: Self-pay | Admitting: Neurology

## 2013-07-09 MED ORDER — PHENYTOIN SODIUM EXTENDED 100 MG PO CAPS: 200.0000 mg | ORAL_CAPSULE | Freq: Every day | ORAL | Status: DC

## 2013-07-09 MED ORDER — ZOLPIDEM TARTRATE 10 MG PO TABS: ORAL_TABLET | ORAL | Status: DC

## 2013-07-09 NOTE — Telephone Encounter (Signed)
[name] called requesting we send an Rx for a short supply of Dilantin to [facility] while they wait to get mail order.

## 2013-07-29 NOTE — Telephone Encounter (Signed)
done

## 2013-10-14 ENCOUNTER — Ambulatory Visit (INDEPENDENT_AMBULATORY_CARE_PROVIDER_SITE_OTHER): Payer: Medicare Other | Admitting: Neurology

## 2013-10-14 ENCOUNTER — Encounter (INDEPENDENT_AMBULATORY_CARE_PROVIDER_SITE_OTHER): Payer: Self-pay

## 2013-10-14 ENCOUNTER — Encounter: Payer: Self-pay | Admitting: Neurology

## 2013-10-14 VITALS — BP 152/79 | HR 75 | Wt 87.0 lb

## 2013-10-14 DIAGNOSIS — G4701 Insomnia due to medical condition: Secondary | ICD-10-CM

## 2013-10-14 DIAGNOSIS — F0391 Unspecified dementia with behavioral disturbance: Secondary | ICD-10-CM

## 2013-10-14 DIAGNOSIS — G9340 Encephalopathy, unspecified: Secondary | ICD-10-CM

## 2013-10-14 DIAGNOSIS — R410 Disorientation, unspecified: Secondary | ICD-10-CM

## 2013-10-14 HISTORY — DX: Disorientation, unspecified: R41.0

## 2013-10-14 MED ORDER — QUETIAPINE FUMARATE 25 MG PO TABS: 25.0000 mg | ORAL_TABLET | Freq: Every day | ORAL | Status: DC

## 2013-10-14 MED ORDER — PHENYTOIN SODIUM EXTENDED 100 MG PO CAPS: 200.0000 mg | ORAL_CAPSULE | Freq: Every day | ORAL | Status: DC

## 2013-10-14 MED ORDER — DONEPEZIL HCL 10 MG PO TABS: 10.0000 mg | ORAL_TABLET | Freq: Every day | ORAL | Status: DC

## 2013-10-14 MED ORDER — ZALEPLON 10 MG PO CAPS: 10.0000 mg | ORAL_CAPSULE | Freq: Every evening | ORAL | Status: DC | PRN

## 2013-10-14 NOTE — Patient Instructions (Signed)
Dementia Dementia is a general term for problems with brain function. A person with dementia has memory loss and a hard time with at least one other brain function such as thinking, speaking, or problem solving. Dementia can affect social functioning, how you do your job, your mood, or your personality. The changes may be hidden for a long time. The earliest forms of this disease are usually not detected by family or friends. Dementia can be:  Irreversible.  Potentially reversible.  Partially reversible.  Progressive. This means it can get worse over time. CAUSES  Irreversible dementia causes may include:  Degeneration of brain cells (Alzheimer's disease or lewy body dementia).  Multiple small strokes (vascular dementia).  Infection (chronic meningitis or Creutzfelt-Jakob disease).  Frontotemporal dementia. This affects younger people, age 40 to 70, compared to those who have Alzheimer's disease.  Dementia associated with other disorders like Parkinson's disease, Huntington's disease, or HIV-associated dementia. Potentially or partially reversible dementia causes may include:  Medicines.  Metabolic causes such as excessive alcohol intake, vitamin B12 deficiency, or thyroid disease.  Masses or pressure in the brain such as a tumor, blood clot, or hydrocephalus. SYMPTOMS  Symptoms are often hard to detect. Family members or coworkers may not notice them early in the disease process. Different people with dementia may have different symptoms. Symptoms can include:  A hard time with memory, especially recent memory. Long-term memory may not be impaired.  Asking the same question multiple times or forgetting something someone just said.  A hard time speaking your thoughts or finding certain words.  A hard time solving problems or performing familiar tasks (such as how to use a telephone).  Sudden changes in mood.  Changes in personality, especially increasing moodiness or  mistrust.  Depression.  A hard time understanding complex ideas that were never a problem in the past. DIAGNOSIS  There are no specific tests for dementia.   Your caregiver may recommend a thorough evaluation. This is because some forms of dementia can be reversible. The evaluation will likely include a physical exam and getting a detailed history from you and a family member. The history often gives the best clues and suggestions for a diagnosis.  Memory testing may be done. A detailed brain function evaluation called neuropsychologic testing may be helpful.  Lab tests and brain imaging (such as a CT scan or MRI scan) are sometimes important.  Sometimes observation and re-evaluation over time is very helpful. TREATMENT  Treatment depends on the cause.   If the problem is a vitamin deficiency, it may be helped or cured with supplements.  For dementias such as Alzheimer's disease, medicines are available to stabilize or slow the course of the disease. There are no cures for this type of dementia.  Your caregiver can help direct you to groups, organizations, and other caregivers to help with decisions in the care of you or your loved one. HOME CARE INSTRUCTIONS The care of individuals with dementia is varied and dependent upon the progression of the dementia. The following suggestions are intended for the person living with, or caring for, the person with dementia.  Create a safe environment.  Remove the locks on bathroom doors to prevent the person from accidentally locking himself or herself in.  Use childproof latches on kitchen cabinets and any place where cleaning supplies, chemicals, or alcohol are kept.  Use childproof covers in unused electrical outlets.  Install childproof devices to keep doors and windows secured.  Remove stove knobs or install safety   knobs and an automatic shut-off on the stove.  Lower the temperature on water heaters.  Label medicines and keep them  locked up.  Secure knives, lighters, matches, power tools, and guns, and keep these items out of reach.  Keep the house free from clutter. Remove rugs or anything that might contribute to a fall.  Remove objects that might break and hurt the person.  Make sure lighting is good, both inside and outside.  Install grab rails as needed.  Use a monitoring device to alert you to falls or other needs for help.  Reduce confusion.  Keep familiar objects and people around.  Use night lights or dim lights at night.  Label items or areas.  Use reminders, notes, or directions for daily activities or tasks.  Keep a simple, consistent routine for waking, meals, bathing, dressing, and bedtime.  Create a calm, quiet environment.  Place large clocks and calendars prominently.  Display emergency numbers and home address near all telephones.  Use cues to establish different times of the day. An example is to open curtains to let the natural light in during the day.   Use effective communication.  Choose simple words and short sentences.  Use a gentle, calm tone of voice.  Be careful not to interrupt.  If the person is struggling to find a word or communicate a thought, try to provide the word or thought.  Ask one question at a time. Allow the person ample time to answer questions. Repeat the question again if the person does not respond.  Reduce nighttime restlessness.  Provide a comfortable bed.  Have a consistent nighttime routine.  Ensure a regular walking or physical activity schedule. Involve the person in daily activities as much as possible.  Limit napping during the day.  Limit caffeine.  Attend social events that stimulate rather than overwhelm the senses.  Encourage good nutrition and hydration.  Reduce distractions during meal times and snacks.  Avoid foods that are too hot or too cold.  Monitor chewing and swallowing ability.  Continue with routine vision,  hearing, dental, and medical screenings.  Only give over-the-counter or prescription medicines as directed by the caregiver.  Monitor driving abilities. Do not allow the person to drive when safe driving is no longer possible.  Register with an identification program which could provide location assistance in the event of a missing person situation. SEEK MEDICAL CARE IF:   New behavioral problems start such as moodiness, aggressiveness, or seeing things that are not there (hallucinations).  Any new problem with brain function happens. This includes problems with balance, speech, or falling a lot.  Problems with swallowing develop.  Any symptoms of other illness happen. Small changes or worsening in any aspect of brain function can be a sign that the illness is getting worse. It can also be a sign of another medical illness such as infection. Seeing a caregiver right away is important. SEEK IMMEDIATE MEDICAL CARE IF:   A fever develops.  New or worsened confusion develops.  New or worsened sleepiness develops.  Staying awake becomes hard to do. Document Released: 05/22/2001 Document Revised: 02/18/2012 Document Reviewed: 04/23/2011 [facility] Patient Information 2014 [place], [place]. Delirium Delirium (acute confusional state) is a sudden change in a person's brain function that causes the person to become confused for a short period of time. People with delirium often have trouble knowing where they are. Delirium comes on very fast. It can develop in a few days or just a few hours.  Delirium usually occurs because of another mental or physical condition. For example, a person might develop delirium after a surgery. It is especially common in elderly people who are sick or in the hospital. People with dementia (a brain disease like Alzheimer's) or people who are near death may also develop delirium.  CAUSES  Delirium occurs when something affects the signals that the brain sends out. These  signals can be affected by anything that puts stress on the body and brain, causing brain chemicals to be out of balance. Structural health problems such as acute strokes, bleeding near the brain (intracranial bleeds), and trauma can also cause delirium. Sometimes the exact cause of delirium is not known. Usually, several factors contribute to the development of delirium. Things that may cause a person to develop delirium include:  Surgery, especially when anesthetics are involved.  Chronic medical conditions, such as chronic lung, heart, or kidney disease.  Fever.  Low body temperature (hypothermia).  Infection, such as pneumonia, severe intestinal infection, severe skin infection, or urinary tract infection.  Poor nutrition that leads to very low vitamin or protein levels in the body (malnutrition).  Body fluid loss (dehydration).  Low blood sugar.  High blood sugar, which typically occurs in people with severe diabetes.  Electrolyte abnormalities, such as sodium imbalance or acid-base disorders.  Low oxygen level.  Low blood pressure.  Uncontrolled high blood pressure.  Brain injury (trauma).  Stroke.  Abuse of alcohol or sudden withdrawal of alcohol.  Sudden tobacco withdrawal if the person is a longtime smoker.  Loss of vision or hearing.  Being strapped down (restrained).  Being in a new setting, such as an elderly person being admitted to a hospital.  Taking illegal drugs or quitting use of those drugs.  Taking certain medicines for pain, sleep, allergies, high blood pressure, anxiety, depression, Parkinson's disease, or seizures.  Sundowning syndrome, which is a complication of chronic dementia that can occur in the later part of a person's wake cycle. SYMPTOMS  The main sign of delirium is a sudden change in a person's mental state. This change can come and go. Symptoms may include:  Not being able to pay attention.  Being confused about places, time, and  people.  Seeing, hearing, or feeling things that are not real (hallucinations).  Changes in sleep patterns.  Being restless, hyperactive, irritable, and angry.  Extreme mood swings.  Rambling and senseless talking.  Difficulty speaking or understanding speech.  Memory loss.  Changes in consciousness, such as being sleepy, sluggish, lethargic, and withdrawn.  Focusing on things or ideas that are not important.  Unusual body movements or shaking (tremors). DIAGNOSIS  There are no specific tests for delirium, but a caregiver may do the following:  Perform a mental status assessment.The caregiver will check for confusion and lack of awareness by talking with the person and asking questions.  Talk with the person's friends and family. A friend or family member will often need to tell the caregiver about the person's symptoms and medical history, including medicines taken or missed.  Perform a physical exam. The caregiver will perform a physical exam to check for underlying conditions that may lead to delirium, such as dehydration, malnutrition, trauma, and infection. The exam may include checking for changes in vision, hearing, and the way the person moves (coordination and reflexes). The caregiver may order tests, such as:  Blood tests.  Urine tests.  Brain imaging, such as a CT scan or MRI scan.  X-rays (to look for  lung problems or intestinal blockage). TREATMENT  Treatment will focus on the cause of the delirium. Delirium is a sign of another problem. If that problem can be found and treated, the delirium may go away. Full recovery can take several weeks. Keeping the person safe until the cause can be found and treated (supportive care) is often what is needed. In some cases, medicine may be prescribed to help keep the person calm. People with delirium should not be left alone because they may involuntarily harm themselves. HOME CARE INSTRUCTIONS  Supportive care is provided  by the person living with, or caring for, the person with delirium. It involves keeping the person safe, encouraging healthy habits, and helping the person stay aware of his or her surroundings. Take these steps to help care for someone with delirium:  Make sure the person eats a healthy diet.  Make sure the person gets enough fluids. The person should drink enough fluids to keep urine clear or pale yellow.  Do not leave the person alone.  Keep the person on a regular schedule. Maintain regular times for meals, sleeping, and being active.  Keep the home as quiet and stress-free as possible. This is very important at night and will help improve the quality of the person's sleep.  Let lots of sunlight into the home during the day.  Avoid total darkness at night.  Help the person maintain good hygiene to avoid skin infections, bed sores, and pressure ulcers.  Do activities outside as often as possible.  Take the person to see others whenever you can.  Make sure the person uses hearing aids and eyeglasses if needed.  Give frequent verbal reminders of the current time, location, and situation.  Use memory cues, such as clocks, calendars, and family photos.  Try to keep the person calm. Music or relaxation techniques may be helpful.  Always be on the lookout for symptoms of delirium.  Do not use restraints.  Only give over-the-counter or prescription medicines as directed by the caregiver.  Make sure the person takes all medicines on a regular schedule as directed by the caregiver.  Keep all follow-up appointments. SEEK MEDICAL CARE IF:  Any of the person's symptoms become worse.  New signs of delirium develop.  Caring for the person at home does not seem safe.  The person stops eating, drinking, or communicating.  The person develops vomiting. SEEK IMMEDIATE MEDICAL CARE IF:   Symptoms of delirium do not go away after treatment.  The person develops chest pain or  shortness of breath.  The person seems to want to harm someone or harm himself or herself. MAKE SURE YOU:   Understand these instructions.  Will watch the condition.  Will get help right away if the person is not doing well or gets worse. Document Released: 08/20/2012 Document Reviewed: 08/20/2012 [facility] Patient Information 2014 [place], [place].

## 2013-10-14 NOTE — Progress Notes (Addendum)
[facility]  Provider:  Larey Seat, M D  Referring Provider: No ref. provider found Primary Care Physician:  No primary provider on file.  Chief Complaint  Patient presents with  . Annual Exam    HPI:  [name] is a 62 y.o. female  Is seen here as a referral/ revisit  from [name]  for memory loss, confusion..    63 year old Caucasian right-handed female returns today for follow up. She was last seen in February 2014 and her memory tests were than  stable from last year.  The Mini-Mental test was documented as 20/30 points  in February and 21/30 in the previous tests. She continues to live with her husband in an independent setting, she can dress herself , bath herself,  she is unable to cook - based on safety concerns. 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 Her mood became increasingly depressed over the last 2 years  and Depakote has helped to modulate [name] behavior when she gets irritated and frustrated . She was prescribed Celexa which helped these depression symptoms . On her GDS she had scored 6 points indicating at least a mild, residual depression.  She also likes to take daytime naps.   Patient's primary physician requested this visit after a recent hosptalization.  She had UTI and fever, and was severley confused when admitted to hospital.  During her hospitalization at [facility] an EEG was performed , showing left brain originating epileptiform brain discharges (  described as epileptiform without clinical correlate) . The EEG showed periodic discharges-  But neither status epilepticus nor subclinical seizures appeared related.  The patient was noted to be agitated and  In delirium in the setting of this  hospitalization . It  was felt that this delirium developed  due to 1) medication, 2) febrile UTI and 3)the change in environment- all  in the setting of worsening dementia - causing behavior changes.  She was  started on Seroquel  12.5 mg po  twice a day with improvement in her agitation. The doses may need further adjustment.  Please note that ciprofloxacin  Was used in the treatment of her UTI and several other antibiotics in the same family have a seizure threshold lowering effect.  She also had low albumin levels,  it was felt that Dilantin would therefor not be a good medication to be continued further.   Patient remains with poor attention , but is not agitated or impulsive today. She feels " down" . She requested a  sleep aid, waking up 3-4 times at night. She denies nocturia.   EDS: Epworth is 12 points.   Review of Systems: Out of a complete 14 system review, the patient complains of only the following symptoms, and all other reviewed systems are negative. EDS, insomnia. Memory test not provided by assistent. Hospitalization records reviewed on [facility]. [facility] Dr. Ralene Ok     History   Social History  . Marital Status: Married    Spouse Name: N/A    Number of Children: N/A  . Years of Education: N/A   Occupational History  . Not on file.   Social History Main Topics  . Smoking status: Current Every Day Smoker -- 1.00 packs/day    Types: Cigarettes  . Smokeless tobacco: Not on file  . Alcohol Use: No  . Drug Use: No  . Sexual Activity: Not on file   Other Topics Concern  . Not on  file   Social History Narrative  . No narrative on file    No family history on file.  Past Medical History  Diagnosis Date  . Renal disorder     kidney stones    Past Surgical History  Procedure Laterality Date  . Abdominal hysterectomy    . Knee surgery      Current Outpatient Prescriptions  Medication Sig Dispense Refill  . desvenlafaxine (PRISTIQ) 50 MG 24 hr tablet Take 50 mg by mouth daily.      . divalproex (DEPAKOTE) 250 MG DR tablet Take 250 mg by mouth 2 (two) times daily.      Marland Kitchen donepezil (ARICEPT) 10 MG tablet Take 10 mg by mouth at bedtime.      Marland Kitchen ibuprofen  (ADVIL,MOTRIN) 200 MG tablet Take 400 mg by mouth every 6 (six) hours as needed for headache.      . phenytoin (DILANTIN) 100 MG ER capsule Take 2 capsules (200 mg total) by mouth at bedtime.  28 capsule  1  . traMADol (ULTRAM) 50 MG tablet Take 50 mg by mouth every 6 (six) hours as needed for pain.      Marland Kitchen zolpidem (AMBIEN) 10 MG tablet Take one half tablet (5mg ) po at hs, may take an additional one half tab (5mg ) if needed.  Do not exceed 1 full tab (10mg ) nightly  90 tablet  1  . metroNIDAZOLE (FLAGYL) 500 MG tablet       . nitrofurantoin (MACRODANTIN) 100 MG capsule       . oxyCODONE (OXY IR/ROXICODONE) 5 MG immediate release tablet        No current facility-administered medications for this visit.    Allergies as of 10/14/2013 - Review Complete 10/14/2013  Allergen Reaction Noted  . Codeine      Vitals: BP 152/79  Pulse 75  Wt 87 lb (39.463 kg) Last Weight:  Wt Readings from Last 1 Encounters:  10/14/13 87 lb (39.463 kg)   Last Height:   Ht Readings from Last 1 Encounters:  10/28/08 5\' 1"  (1.549 m)    Physical exam:  General: The patient is awake, alert and appears not in acute distress. The patient is well groomed. Head: Normocephalic, atraumatic. Neck is supple. Mallampati 2 , neck circumference: 13 .73 Cardiovascular:  Regular rate and rhythm  without  murmurs or carotid bruit, and without distended neck veins. Respiratory: Lungs wheezing  auscultation. Skin:  Without evidence of edema, or rash Trunk: low BMI   Neurologic exam : The patient is awake and alert, oriented to place and time.  Memory impaired as are attention span & concentration ability. MMSE was not performed.  Speech is fluent without  dysarthria, dysphonia or aphasia. Mood and affect are appropriate.  Cranial nerves: Pupils are equal and briskly reactive to light. Extraocular movements  in vertical and horizontal planes intact and without nystagmus. Visual fields by finger perimetry are  intact. Hearing to finger rub intact.  Facial sensation intact to fine touch. Facial motor strength is symmetric and tongue and uvula move midline.  Motor exam: Normal tone and normal muscle bulk and symmetric normal strength in all extremities.  Sensory:  Fine touch, pinprick and vibration were normal.  Coordination: Rapid alternating movements in the fingers/hands is tested and normal. Finger-to-nose maneuver tested and normal without evidence of ataxia, dysmetria or tremor.  Gait and station: Patient walks without assistive device . Deep tendon reflexes: in the upper and lower extremities are symmetric and intact. Babinski maneuver response  is  downgoing.   Assessment:  After physical and neurologic examination, review of laboratory studies, imaging, neurophysiology testing and pre-existing records: 1) Delirium , caused by ciproflaxacin, UTI , fever and environmental changes in the setting of dementia.  2) DEMENTIA . Alzheimer's type . 3) Seizure - EEG abnormalities . Can be a manifestation in late dementia.  4) insomnia- this is related by the patient and not confirmed by her husband , who was not present today.   Plan:  Treatment plan and additional workup : refilled  medication, repeat EEG prior to next RV .  and MOCA /MMSE/ GDS  in next visit in 3 month with NP. Requested her caregiver to be present in RV. Filled ambien 5 mg capsule .

## 2013-10-22 ENCOUNTER — Other Ambulatory Visit: Payer: Self-pay | Admitting: Neurology

## 2013-10-22 MED ORDER — ZOLPIDEM TARTRATE 10 MG PO TABS: 10.0000 mg | ORAL_TABLET | Freq: Every evening | ORAL | Status: DC | PRN

## 2013-10-22 NOTE — Addendum Note (Signed)
Addended by: Larey Seat on: 10/22/2013 11:13 AM   Modules accepted: Orders, Medications

## 2013-10-22 NOTE — Telephone Encounter (Signed)
Pt's prescription was faxed over to [facility] at [phone].

## 2013-11-12 ENCOUNTER — Ambulatory Visit (INDEPENDENT_AMBULATORY_CARE_PROVIDER_SITE_OTHER): Payer: Medicare Other

## 2013-11-12 DIAGNOSIS — F0391 Unspecified dementia with behavioral disturbance: Secondary | ICD-10-CM

## 2013-11-12 DIAGNOSIS — G9340 Encephalopathy, unspecified: Secondary | ICD-10-CM

## 2013-11-12 DIAGNOSIS — R410 Disorientation, unspecified: Secondary | ICD-10-CM

## 2013-11-26 NOTE — Procedures (Signed)
[facility]  EEG (ELECTROENCEPHALOGRAM) REPORT   STUDY DATE:   11-12-13  PATIENT NAME: [name], [name] . EEG [id number]   DOB:  06-24-1938 MRN:  ORDERING CLINICIAN: Larey Seat, MD   TECHNOLOGIST: Eppich-Jones TECHNIQUE: Electroencephalogram was recorded utilizing standard 10-20 system of lead placement , reformatted into averaging and bipolar montages.      RECORDING TIME:  27.7 minutes  ACTIVATION:  HV / strobe lights.   CLINICAL INFORMATION: Patient of Dr.Dough. Memory loss, progressive confusion, subacute delirium versus progressive dementia.     FINDINGS:  EEG  Post. Dominant Background rhythm of  7  hertz .  Bilateral central slowing without sleep being recorded,  Drowsiness without associated EEG asymmetry. Photic stimulation caused excessive blink response, entraining at 5,7 and 9 hertz without epileptiform activity.  EKG was very irregular.   This patient may need to be monitored for cardiac arrythmia , and Aricept may be contributing to a higher risk of fainting, due to antidromic side effects.      Patient recorded in the awake/ drowsy state.  EKG channel : 52-66   bpm .   / Hyperventilation was provoking additional amplitude built up.    IMPRESSION:  EEG is abnormally slow.  This finding can be seen in static and metabolic -toxic encephalopathies. Dementia is often associated with slowing.  The EKG was abnormal.          Larey Seat , MD

## 2013-12-18 NOTE — Progress Notes (Signed)
Quick Note:  I called patient and reviewed findings. She asked if this was because of her alzheimer's and if anything can be done about it. I concurred that it can be associated with that. The thing for her to do is continue to take her medication. ______

## 2014-01-12 ENCOUNTER — Other Ambulatory Visit: Payer: Self-pay

## 2014-01-12 DIAGNOSIS — F03918 Unspecified dementia, unspecified severity, with other behavioral disturbance: Secondary | ICD-10-CM

## 2014-01-12 DIAGNOSIS — G9340 Encephalopathy, unspecified: Secondary | ICD-10-CM

## 2014-01-12 DIAGNOSIS — F05 Delirium due to known physiological condition: Secondary | ICD-10-CM

## 2014-01-12 DIAGNOSIS — R410 Disorientation, unspecified: Secondary | ICD-10-CM

## 2014-01-12 DIAGNOSIS — F0391 Unspecified dementia with behavioral disturbance: Secondary | ICD-10-CM

## 2014-01-12 MED ORDER — DONEPEZIL HCL 10 MG PO TABS: 10.0000 mg | ORAL_TABLET | Freq: Every day | ORAL | Status: DC

## 2014-01-12 MED ORDER — QUETIAPINE FUMARATE 25 MG PO TABS: 25.0000 mg | ORAL_TABLET | Freq: Every evening | ORAL | Status: DC

## 2014-01-13 ENCOUNTER — Encounter: Payer: Self-pay | Admitting: Surgery

## 2014-01-15 ENCOUNTER — Other Ambulatory Visit: Payer: Self-pay

## 2014-01-15 MED ORDER — CITALOPRAM HYDROBROMIDE 40 MG PO TABS: 40.0000 mg | ORAL_TABLET | Freq: Every day | ORAL | Status: DC

## 2014-01-15 NOTE — Telephone Encounter (Signed)
Per last OV in [facility]: PLAN: Will renew meds, pt advised to call Dr. Garlon Hatchet about B/P.  GDS=8, will increase Celexa to 40mg  daily. Made aware there are smoking cessation classes available at the hospital. She has acute bronchitis today and is coughing,  suspect she has emphysema.  visit 45 minutes .   Vst time 30 min

## 2014-01-17 ENCOUNTER — Encounter (HOSPITAL_COMMUNITY): Payer: Self-pay | Admitting: Emergency Medicine

## 2014-01-17 ENCOUNTER — Emergency Department (HOSPITAL_COMMUNITY): Payer: Medicare Other

## 2014-01-17 ENCOUNTER — Inpatient Hospital Stay (HOSPITAL_COMMUNITY)
Admission: EM | Admit: 2014-01-17 | Discharge: 2014-01-27 | DRG: 237 | Disposition: A | Discharge status: Home Health | Payer: Medicare Other | Attending: Internal Medicine | Admitting: Internal Medicine

## 2014-01-17 ENCOUNTER — Other Ambulatory Visit: Payer: Self-pay

## 2014-01-17 DIAGNOSIS — K6819 Other retroperitoneal abscess: Secondary | ICD-10-CM

## 2014-01-17 DIAGNOSIS — R1310 Dysphagia, unspecified: Secondary | ICD-10-CM

## 2014-01-17 DIAGNOSIS — I745 Embolism and thrombosis of iliac artery: Principal | ICD-10-CM | POA: Diagnosis present

## 2014-01-17 DIAGNOSIS — F05 Delirium due to known physiological condition: Secondary | ICD-10-CM

## 2014-01-17 DIAGNOSIS — Z87891 Personal history of nicotine dependence: Secondary | ICD-10-CM

## 2014-01-17 DIAGNOSIS — I999 Unspecified disorder of circulatory system: Secondary | ICD-10-CM

## 2014-01-17 DIAGNOSIS — D62 Acute posthemorrhagic anemia: Secondary | ICD-10-CM

## 2014-01-17 DIAGNOSIS — Z86010 Personal history of colonic polyps: Secondary | ICD-10-CM

## 2014-01-17 DIAGNOSIS — K912 Postsurgical malabsorption, not elsewhere classified: Secondary | ICD-10-CM

## 2014-01-17 DIAGNOSIS — B952 Enterococcus as the cause of diseases classified elsewhere: Secondary | ICD-10-CM | POA: Diagnosis present

## 2014-01-17 DIAGNOSIS — F079 Unspecified personality and behavioral disorder due to known physiological condition: Secondary | ICD-10-CM

## 2014-01-17 DIAGNOSIS — G309 Alzheimer's disease, unspecified: Secondary | ICD-10-CM | POA: Diagnosis present

## 2014-01-17 DIAGNOSIS — I70229 Atherosclerosis of native arteries of extremities with rest pain, unspecified extremity: Secondary | ICD-10-CM | POA: Diagnosis present

## 2014-01-17 DIAGNOSIS — N200 Calculus of kidney: Secondary | ICD-10-CM

## 2014-01-17 DIAGNOSIS — N390 Urinary tract infection, site not specified: Secondary | ICD-10-CM

## 2014-01-17 DIAGNOSIS — L408 Other psoriasis: Secondary | ICD-10-CM

## 2014-01-17 DIAGNOSIS — K589 Irritable bowel syndrome without diarrhea: Secondary | ICD-10-CM

## 2014-01-17 DIAGNOSIS — I80209 Phlebitis and thrombophlebitis of unspecified deep vessels of unspecified lower extremity: Secondary | ICD-10-CM | POA: Diagnosis present

## 2014-01-17 DIAGNOSIS — E538 Deficiency of other specified B group vitamins: Secondary | ICD-10-CM

## 2014-01-17 DIAGNOSIS — J449 Chronic obstructive pulmonary disease, unspecified: Secondary | ICD-10-CM

## 2014-01-17 DIAGNOSIS — I708 Atherosclerosis of other arteries: Secondary | ICD-10-CM | POA: Diagnosis present

## 2014-01-17 DIAGNOSIS — N12 Tubulo-interstitial nephritis, not specified as acute or chronic: Secondary | ICD-10-CM

## 2014-01-17 DIAGNOSIS — I10 Essential (primary) hypertension: Secondary | ICD-10-CM | POA: Diagnosis present

## 2014-01-17 DIAGNOSIS — K449 Diaphragmatic hernia without obstruction or gangrene: Secondary | ICD-10-CM

## 2014-01-17 DIAGNOSIS — F0280 Dementia in other diseases classified elsewhere without behavioral disturbance: Secondary | ICD-10-CM | POA: Diagnosis present

## 2014-01-17 DIAGNOSIS — I998 Other disorder of circulatory system: Secondary | ICD-10-CM

## 2014-01-17 DIAGNOSIS — K222 Esophageal obstruction: Secondary | ICD-10-CM

## 2014-01-17 DIAGNOSIS — Z79899 Other long term (current) drug therapy: Secondary | ICD-10-CM

## 2014-01-17 DIAGNOSIS — Z681 Body mass index (BMI) 19 or less, adult: Secondary | ICD-10-CM

## 2014-01-17 DIAGNOSIS — N809 Endometriosis, unspecified: Secondary | ICD-10-CM

## 2014-01-17 DIAGNOSIS — K902 Blind loop syndrome, not elsewhere classified: Secondary | ICD-10-CM

## 2014-01-17 DIAGNOSIS — K219 Gastro-esophageal reflux disease without esophagitis: Secondary | ICD-10-CM | POA: Diagnosis present

## 2014-01-17 DIAGNOSIS — N3010 Interstitial cystitis (chronic) without hematuria: Secondary | ICD-10-CM

## 2014-01-17 DIAGNOSIS — E861 Hypovolemia: Secondary | ICD-10-CM

## 2014-01-17 DIAGNOSIS — R410 Disorientation, unspecified: Secondary | ICD-10-CM

## 2014-01-17 DIAGNOSIS — D696 Thrombocytopenia, unspecified: Secondary | ICD-10-CM | POA: Diagnosis present

## 2014-01-17 DIAGNOSIS — N179 Acute kidney failure, unspecified: Secondary | ICD-10-CM | POA: Diagnosis present

## 2014-01-17 DIAGNOSIS — K56609 Unspecified intestinal obstruction, unspecified as to partial versus complete obstruction: Secondary | ICD-10-CM

## 2014-01-17 DIAGNOSIS — Z7902 Long term (current) use of antithrombotics/antiplatelets: Secondary | ICD-10-CM

## 2014-01-17 DIAGNOSIS — E43 Unspecified severe protein-calorie malnutrition: Secondary | ICD-10-CM | POA: Insufficient documentation

## 2014-01-17 DIAGNOSIS — R51 Headache: Secondary | ICD-10-CM

## 2014-01-17 HISTORY — DX: Unspecified toxic encephalopathy: G92.9

## 2014-01-17 HISTORY — DX: Alzheimer's disease, unspecified: G30.9

## 2014-01-17 HISTORY — DX: Toxic encephalopathy: G92

## 2014-01-17 HISTORY — DX: Dementia in other diseases classified elsewhere without behavioral disturbance: F02.80

## 2014-01-17 HISTORY — DX: Urinary tract infection, site not specified: N39.0

## 2014-01-17 LAB — CBC WITH DIFFERENTIAL/PLATELET
Basophils Absolute: 0 10*3/uL (ref 0.0–0.1)
Basophils Relative: 0 % (ref 0–1)
Eosinophils Absolute: 0.2 10*3/uL (ref 0.0–0.7)
Eosinophils Relative: 3 % (ref 0–5)
HCT: 27.5 % — ABNORMAL LOW (ref 36.0–46.0)
Hemoglobin: 8.9 g/dL — ABNORMAL LOW (ref 12.0–15.0)
LYMPHS ABS: 2.7 10*3/uL (ref 0.7–4.0)
LYMPHS PCT: 37 % (ref 12–46)
MCH: 27.6 pg (ref 26.0–34.0)
MCHC: 32.4 g/dL (ref 30.0–36.0)
MCV: 85.1 fL (ref 78.0–100.0)
Monocytes Absolute: 0.6 10*3/uL (ref 0.1–1.0)
Monocytes Relative: 8 % (ref 3–12)
Neutro Abs: 3.8 10*3/uL (ref 1.7–7.7)
Neutrophils Relative %: 52 % (ref 43–77)
PLATELETS: 225 10*3/uL (ref 150–400)
RBC: 3.23 MIL/uL — AB (ref 3.87–5.11)
RDW: 16.1 % — AB (ref 11.5–15.5)
WBC: 7.3 10*3/uL (ref 4.0–10.5)

## 2014-01-17 LAB — BASIC METABOLIC PANEL
BUN: 13 mg/dL (ref 6–23)
CALCIUM: 7.9 mg/dL — AB (ref 8.4–10.5)
CHLORIDE: 105 meq/L (ref 96–112)
CO2: 19 meq/L (ref 19–32)
Creatinine, Ser: 1.55 mg/dL — ABNORMAL HIGH (ref 0.50–1.10)
GFR calc Af Amer: 40 mL/min — ABNORMAL LOW (ref >90)
GFR calc non Af Amer: 35 mL/min — ABNORMAL LOW (ref >90)
GLUCOSE: 91 mg/dL (ref 70–99)
Potassium: 3.9 mEq/L (ref 3.7–5.3)
SODIUM: 138 meq/L (ref 137–147)

## 2014-01-17 LAB — PROTIME-INR
INR: 1.01 (ref 0.00–1.49)
PROTHROMBIN TIME: 13.1 s (ref 11.6–15.2)

## 2014-01-17 LAB — APTT: APTT: 31 s (ref 24–37)

## 2014-01-17 MED ORDER — CLOPIDOGREL BISULFATE 75 MG PO TABS
75.0000 mg | ORAL_TABLET | Freq: Every day | ORAL | Status: DC
  Administered 2014-01-18 – 2014-01-19 (×2): 75 mg via ORAL
  Filled 2014-01-17 (×5): qty 1

## 2014-01-17 MED ORDER — MORPHINE SULFATE 4 MG/ML IJ SOLN
2.0000 mg | Freq: Once | INTRAMUSCULAR | Status: AC
  Administered 2014-01-17: 2 mg via INTRAVENOUS
  Filled 2014-01-17: qty 1

## 2014-01-17 MED ORDER — AMLODIPINE BESYLATE 5 MG PO TABS
5.0000 mg | ORAL_TABLET | Freq: Every day | ORAL | Status: DC
  Administered 2014-01-17 – 2014-01-27 (×10): 5 mg via ORAL
  Filled 2014-01-17 (×11): qty 1

## 2014-01-17 MED ORDER — MAGNESIUM OXIDE 400 (241.3 MG) MG PO TABS
400.0000 mg | ORAL_TABLET | Freq: Every day | ORAL | Status: DC
  Administered 2014-01-17 – 2014-01-27 (×10): 400 mg via ORAL
  Filled 2014-01-17 (×11): qty 1

## 2014-01-17 MED ORDER — DONEPEZIL HCL 10 MG PO TABS
10.0000 mg | ORAL_TABLET | Freq: Every day | ORAL | Status: DC
Start: 2014-01-17 — End: 2014-01-27
  Administered 2014-01-17 – 2014-01-26 (×10): 10 mg via ORAL
  Filled 2014-01-17 (×11): qty 1

## 2014-01-17 MED ORDER — SODIUM CHLORIDE 0.9 % IV SOLN
INTRAVENOUS | Status: DC
  Administered 2014-01-17 – 2014-01-20 (×4): via INTRAVENOUS

## 2014-01-17 MED ORDER — SODIUM CHLORIDE 0.9 % IV BOLUS (SEPSIS)
1000.0000 mL | Freq: Once | INTRAVENOUS | Status: AC
  Administered 2014-01-17: 1000 mL via INTRAVENOUS

## 2014-01-17 MED ORDER — IBUPROFEN 600 MG PO TABS
600.0000 mg | ORAL_TABLET | Freq: Four times a day (QID) | ORAL | Status: DC | PRN
  Administered 2014-01-18 – 2014-01-19 (×2): 600 mg via ORAL
  Filled 2014-01-17 (×3): qty 1

## 2014-01-17 MED ORDER — HYDROMORPHONE HCL PF 1 MG/ML IJ SOLN
1.0000 mg | INTRAMUSCULAR | Status: DC | PRN
  Administered 2014-01-17 – 2014-01-20 (×19): 1 mg via INTRAVENOUS
  Filled 2014-01-17 (×18): qty 1

## 2014-01-17 MED ORDER — QUETIAPINE FUMARATE 25 MG PO TABS
25.0000 mg | ORAL_TABLET | Freq: Every evening | ORAL | Status: DC
  Administered 2014-01-18 – 2014-01-19 (×3): 25 mg via ORAL
  Filled 2014-01-17 (×4): qty 1

## 2014-01-17 MED ORDER — HEPARIN (PORCINE) IN NACL 100-0.45 UNIT/ML-% IJ SOLN
850.0000 [IU]/h | INTRAMUSCULAR | Status: DC
  Administered 2014-01-17: 750 [IU]/h via INTRAVENOUS
  Administered 2014-01-18: 800 [IU]/h via INTRAVENOUS
  Filled 2014-01-17 (×3): qty 250

## 2014-01-17 MED ORDER — HYDROMORPHONE HCL PF 1 MG/ML IJ SOLN
1.0000 mg | Freq: Once | INTRAMUSCULAR | Status: AC
  Administered 2014-01-17: 1 mg via INTRAVENOUS
  Filled 2014-01-17: qty 1

## 2014-01-17 MED ORDER — PHENYTOIN SODIUM EXTENDED 100 MG PO CAPS
200.0000 mg | ORAL_CAPSULE | Freq: Every day | ORAL | Status: DC
  Administered 2014-01-17 – 2014-01-26 (×10): 200 mg via ORAL
  Filled 2014-01-17 (×11): qty 2

## 2014-01-17 MED ORDER — FIDAXOMICIN 200 MG PO TABS
200.0000 mg | ORAL_TABLET | Freq: Two times a day (BID) | ORAL | Status: DC
  Administered 2014-01-17 – 2014-01-18 (×2): 200 mg via ORAL
  Filled 2014-01-17 (×3): qty 1

## 2014-01-17 MED ORDER — METOPROLOL TARTRATE 25 MG PO TABS
25.0000 mg | ORAL_TABLET | Freq: Two times a day (BID) | ORAL | Status: DC
  Administered 2014-01-17 – 2014-01-27 (×19): 25 mg via ORAL
  Filled 2014-01-17 (×21): qty 1

## 2014-01-17 MED ORDER — ONDANSETRON HCL 4 MG/2ML IJ SOLN
4.0000 mg | Freq: Once | INTRAMUSCULAR | Status: AC
  Administered 2014-01-17: 4 mg via INTRAVENOUS
  Filled 2014-01-17: qty 2

## 2014-01-17 MED ORDER — SODIUM CHLORIDE 0.9 % IJ SOLN
3.0000 mL | Freq: Two times a day (BID) | INTRAMUSCULAR | Status: DC
  Administered 2014-01-18 – 2014-01-24 (×7): 3 mL via INTRAVENOUS
  Administered 2014-01-26: 11:00:00 via INTRAVENOUS

## 2014-01-17 MED ORDER — HEPARIN BOLUS VIA INFUSION
1500.0000 [IU] | Freq: Once | INTRAVENOUS | Status: AC
  Administered 2014-01-17: 1500 [IU] via INTRAVENOUS
  Filled 2014-01-17: qty 1500

## 2014-01-17 NOTE — ED Provider Notes (Signed)
Medical screening examination/treatment/procedure(s) were conducted as a shared visit with non-physician practitioner(s) and myself.  I personally evaluated the patient during the encounter.  EKG Interpretation   None      Patient seen and examined and has toes that are ecchymotic on the left foot, toes 4 and 5. Dorsalis pedis pulses palpable. Suspect that patient has claudication from vascular disease. Will consult vascular surgery  Leota Jacobsen, MD 01/17/14 [phone]

## 2014-01-17 NOTE — Progress Notes (Signed)
   ED PA called with concern with left foot pain.  The outside hospital chart is not available, but I suspect this patient has previously been diagnosed with chronic PAD.  This evening, per ED PA, pt has dopplerable signals with intact motor and sensation.  In this patient with multi-comorbidities, I recommended transfer to [facility] for admission to Hospitalist service.  No immediate surgical intervention is needed.  Given her elevated Cr, rehydration before any further angiography would considered.  Depending on her functional status, if PAD is confirmed with ABI, an angiogram and further intervention might be needed.  Will see patient in AM once transfered to [facility].  [name], MD [facility] Office: [phone] Pager: [phone]  01/17/2014, 8:45 PM

## 2014-01-17 NOTE — ED Notes (Signed)
Attempted to call [facility] for transport, but dispatcher was interrupted and will call back.

## 2014-01-17 NOTE — ED Provider Notes (Signed)
CSN: [id number]     Arrival date & time 01/17/14  1507 History   First MD Initiated Contact with Patient 01/17/14 1629     Chief Complaint  Patient presents with  . Foot Pain   (Consider location/radiation/quality/duration/timing/severity/associated sxs/prior Treatment) HPI Comments: Patient is a 63 year old female with a past medical history of alzheimer's disease who presents with a 9 day history of left foot pain. Patient reports being in the hospital in [place] and was discharged 9 days ago. In the hospital, she was diagnosed with a blood clot in her left leg and a VRE UTI. Patient's left foot has been hurting since being discharged. The pain is aching and severe without radiation. Any movement or palpation of the foot makes the pain worse. She reports associated bruising/discoloration of her toes. No alleviating factors. No other associated symptoms. Patient is currently taking Plavix. Patient was advised to follow up with Vascular surgeon in [place] in 8 days. Patient came to the ED tonight due to unbearable pain. Patient denies any injury.   Patient is a 63 y.o. female presenting with lower extremity pain.  Foot Pain Associated symptoms include arthralgias. Pertinent negatives include no abdominal pain, chest pain, chills, fatigue, fever, nausea, neck pain, vomiting or weakness.    Past Medical History  Diagnosis Date  . Renal disorder     kidney stones  . Subacute delirium 10/14/2013  . Alzheimer's dementia   . Toxic encephalopathy   . UTI (lower urinary tract infection)    Past Surgical History  Procedure Laterality Date  . Abdominal hysterectomy    . Knee surgery    . Bladder surgery     History reviewed. No pertinent family history. History  Substance Use Topics  . Smoking status: Former Smoker -- 1.00 packs/day    Types: Cigarettes  . Smokeless tobacco: Not on file  . Alcohol Use: No   OB History   Grav Para Term Preterm Abortions TAB SAB Ect Mult Living          Review of Systems  Constitutional: Negative for fever, chills and fatigue.  HENT: Negative for trouble swallowing.   Eyes: Negative for visual disturbance.  Respiratory: Negative for shortness of breath.   Cardiovascular: Negative for chest pain and palpitations.  Gastrointestinal: Negative for nausea, vomiting, abdominal pain and diarrhea.  Genitourinary: Negative for dysuria and difficulty urinating.  Musculoskeletal: Positive for arthralgias. Negative for neck pain.  Skin: Negative for color change.  Neurological: Negative for dizziness and weakness.  Psychiatric/Behavioral: Negative for dysphoric mood.    Allergies  Codeine  Home Medications   Current Outpatient Rx  Name  Route  Sig  Dispense  Refill  . amLODipine (NORVASC) 5 MG tablet   Oral   Take 5 mg by mouth daily.         . clopidogrel (PLAVIX) 75 MG tablet   Oral   Take 75 mg by mouth daily with breakfast.         . donepezil (ARICEPT) 10 MG tablet   Oral   Take 1 tablet (10 mg total) by mouth at bedtime.   90 tablet   1   . fidaxomicin (DIFICID) 200 MG TABS tablet   Oral   Take 200 mg by mouth 2 (two) times daily.         Marland Kitchen ibuprofen (ADVIL,MOTRIN) 200 MG tablet   Oral   Take 600 mg by mouth every 6 (six) hours as needed for headache or moderate pain (PAIN).          Marland Kitchen  magnesium oxide (MAG-OX) 400 MG tablet   Oral   Take 400 mg by mouth daily.         . metoprolol tartrate (LOPRESSOR) 25 MG tablet   Oral   Take 25 mg by mouth 2 (two) times daily.         . phenytoin (DILANTIN) 100 MG ER capsule   Oral   Take 2 capsules (200 mg total) by mouth at bedtime.   90 capsule   1     PATIENT IS WAITING ON MAIL ORDER   . QUEtiapine (SEROQUEL) 25 MG tablet   Oral   Take 1 tablet (25 mg total) by mouth every evening.   90 tablet   1     One at 8 Pm po   . traMADol (ULTRAM-ER) 100 MG 24 hr tablet   Oral   Take 100 mg by mouth 4 (four) times daily.         Marland Kitchen EXPIRED:  zolpidem (AMBIEN) 10 MG tablet   Oral   Take 1 tablet (10 mg total) by mouth at bedtime as needed for sleep. Take one half tab by mouth prn insomnia.   30 tablet   0    BP 160/86  Pulse 104  Temp(Src) 98 F (36.7 C) (Oral)  Resp 18  SpO2 100% Physical Exam  Nursing note and vitals reviewed. Constitutional: She is oriented to person, place, and time. She appears well-developed and well-nourished. No distress.  HENT:  Head: Normocephalic and atraumatic.  Eyes: Conjunctivae and EOM are normal.  Neck: Normal range of motion.  Cardiovascular: Regular rhythm and intact distal pulses.  Exam reveals no gallop and no friction rub.   No murmur heard. Faint left pedal pulse. Tachycardic   Pulmonary/Chest: Effort normal and breath sounds normal. She has no wheezes. She has no rales. She exhibits no tenderness.  Abdominal: Soft. She exhibits no distension. There is no tenderness. There is no rebound.  Musculoskeletal: Normal range of motion.  Neurological: She is alert and oriented to person, place, and time. Coordination normal.  Speech is goal-oriented. Moves limbs without ataxia.   Skin: Skin is warm and dry.  Psychiatric: She has a normal mood and affect. Her behavior is normal.    ED Course  Procedures (including critical care time) Labs Review Labs Reviewed  CBC WITH DIFFERENTIAL - Abnormal; Notable for the following:    RBC 3.23 (*)    Hemoglobin 8.9 (*)    HCT 27.5 (*)    RDW 16.1 (*)    All other components within normal limits  BASIC METABOLIC PANEL - Abnormal; Notable for the following:    Creatinine, Ser 1.55 (*)    Calcium 7.9 (*)    GFR calc non Af Amer 35 (*)    GFR calc Af Amer 40 (*)    All other components within normal limits  APTT  PROTIME-INR  BASIC METABOLIC PANEL  HEPARIN LEVEL (UNFRACTIONATED)  CBC   Imaging Review Dg Foot Complete Left  01/17/2014   CLINICAL DATA:  Pain in left foot and toes for 1 month. Discoloration of the fourth and fifth toes.   EXAM: LEFT FOOT - COMPLETE 3+ VIEW  COMPARISON:  CT EXTREM LOW W/O CM*L* dated 02/25/2013; DG FOOT COMPLETE 3+V*L* dated 02/19/2013  FINDINGS: The bones are diffusely demineralized. There is no evidence of acute fracture, dislocation or bone destruction. No focal soft tissue swelling or soft tissue emphysema identified.  IMPRESSION: Generalized osteopenia. No evidence of acute fracture or  osteomyelitis.   Electronically Signed   By: Camie Patience M.D.   On: 01/17/2014 17:08    EKG Interpretation   None       MDM   1. Ischemia of foot     7:22 PM Labs unremarkable for acute changes. Xray unremarkable for acute changes. Patient is slightly tachycardic with remaining vitals stable. Dr. Zenia Resides saw the patient and recommends vascular consult.   Hospitalist will admit the patient and vascular surgery will see her in the morning.    [employer], PA-C 01/18/14 0000

## 2014-01-17 NOTE — ED Notes (Signed)
Pt c/o L foot pain.  Pain score 10/10.  Pt's son sts that patient was diagnosed with a blood clot while in the hospital recently and her toes are discolored.  Pt's son sts the discoloration and swelling has gotten worse.  Pt attempted to follow up w/ vein specialist, but she can not be seen for 2 weeks.

## 2014-01-17 NOTE — Progress Notes (Signed)
ANTICOAGULATION CONSULT NOTE - Initial Consult  Pharmacy Consult for Heparin Indication: DVT  Allergies  Allergen Reactions  . Codeine     REACTION: intolerance    Patient Measurements: Height: 5\' 2"  (157.5 cm) Weight: 81 lb (36.741 kg) IBW/kg (Calculated) : 50.1 Heparin Dosing Weight:   Vital Signs: Temp: 98.2 F (36.8 C) (02/08 1924) Temp src: Oral (02/08 1924) BP: 183/80 mmHg (02/08 1924) Pulse Rate: 82 (02/08 1924)  Labs:  Recent Labs  01/17/14 1741 01/17/14 2126  HGB 8.9*  --   HCT 27.5*  --   PLT 225  --   APTT  --  31  LABPROT  --  13.1  INR  --  1.01  CREATININE 1.55*  --     Estimated Creatinine Clearance: 21.8 ml/min (by C-G formula based on Cr of 1.55).   Medical History: Past Medical History  Diagnosis Date  . Renal disorder     kidney stones  . Subacute delirium 10/14/2013  . Alzheimer's dementia   . Toxic encephalopathy   . UTI (lower urinary tract infection)     Medications:   (Not in a hospital admission) Infusions:  . sodium chloride 75 mL/hr at 01/17/14 2151  . heparin    . heparin      Assessment: Patient with L foot pain.  Patient diagnosed with clot in leg but no oral/SQ anticoagulant noted on med rec.  Baseline coags WNL.  Goal of Therapy:  Heparin level 0.3-0.7 units/ml Monitor platelets by anticoagulation protocol: Yes   Plan:  Heparin bolus 1500 units iv x1 Heparin drip at 750 units/hr Daily heparin level and CBC Next heparin level at  0600. Called [facility] about patient transfer and need to follow with labs (order and results)    Nani Skillern Crowford 01/17/2014,9:52 PM

## 2014-01-17 NOTE — ED Notes (Signed)
Patient denies trauma to foot/toes. The 4th and 5th toes are discolored with dried blood. Unable to assess the origin of blood. Patient can not tolerate light touch.

## 2014-01-17 NOTE — ED Notes (Signed)
[facility] called back for transport

## 2014-01-17 NOTE — H&P (Signed)
[facility] History and Physical  ELYSIA [name] [id number] DOB: 1951/11/08 DOA: 01/17/2014  Referring physician: EDP PCP: Teressa Lower, MD   Chief Complaint: L foot pain   HPI: [name] is a 63 y.o. female who presents to the ED with L foot pain.  Pain is located in her L foot and especially her 1st, 4th and 5th L toes.  Associated with discoloration of toes.  Was admitted to [facility] recently for same, diagnosed with "blood clot" in leg, and discharged, follow up with vascular surgeon in [place] but not for another 8 days.  Due to pain severity patient presented to ED.  Review of Systems: Systems reviewed.  As above, otherwise negative  Past Medical History  Diagnosis Date  . Renal disorder     kidney stones  . Subacute delirium 10/14/2013  . Alzheimer's dementia   . Toxic encephalopathy   . UTI (lower urinary tract infection)    Past Surgical History  Procedure Laterality Date  . Abdominal hysterectomy    . Knee surgery    . Bladder surgery     Social History:  reports that she has quit smoking. Her smoking use included Cigarettes. She smoked 1.00 pack per day. She does not have any smokeless tobacco history on file. She reports that she does not drink alcohol or use illicit drugs.  Allergies  Allergen Reactions  . Codeine     REACTION: intolerance    History reviewed. No pertinent family history.   Prior to Admission medications   Medication Sig Start Date End Date Taking? Authorizing Provider  amLODipine (NORVASC) 5 MG tablet Take 5 mg by mouth daily.   Yes Historical Provider, MD  clopidogrel (PLAVIX) 75 MG tablet Take 75 mg by mouth daily with breakfast.   Yes Historical Provider, MD  donepezil (ARICEPT) 10 MG tablet Take 1 tablet (10 mg total) by mouth at bedtime. 01/12/14  Yes Carmen Dohmeier, MD  fidaxomicin (DIFICID) 200 MG TABS tablet Take 200 mg by mouth 2 (two) times daily.   Yes Historical Provider, MD  ibuprofen (ADVIL,MOTRIN) 200 MG tablet Take  600 mg by mouth every 6 (six) hours as needed for headache or moderate pain (PAIN).    Yes Historical Provider, MD  magnesium oxide (MAG-OX) 400 MG tablet Take 400 mg by mouth daily.   Yes Historical Provider, MD  metoprolol tartrate (LOPRESSOR) 25 MG tablet Take 25 mg by mouth 2 (two) times daily.   Yes Historical Provider, MD  phenytoin (DILANTIN) 100 MG ER capsule Take 2 capsules (200 mg total) by mouth at bedtime. 10/14/13  Yes Carmen Dohmeier, MD  QUEtiapine (SEROQUEL) 25 MG tablet Take 1 tablet (25 mg total) by mouth every evening. 01/12/14  Yes Asencion Partridge Dohmeier, MD  traMADol (ULTRAM-ER) 100 MG 24 hr tablet Take 100 mg by mouth 4 (four) times daily.   Yes Historical Provider, MD  zolpidem (AMBIEN) 10 MG tablet Take 1 tablet (10 mg total) by mouth at bedtime as needed for sleep. Take one half tab by mouth prn insomnia. 10/22/13 11/21/13  Larey Seat, MD   Physical Exam: Filed Vitals:   01/17/14 1924  BP: 183/80  Pulse: 82  Temp: 98.2 F (36.8 C)  Resp: 16    BP 183/80  Pulse 82  Temp(Src) 98.2 F (36.8 C) (Oral)  Resp 16  SpO2 100%  General Appearance:    Alert, oriented, no distress, appears stated age  Head:    Normocephalic, atraumatic  Eyes:    PERRL, EOMI,  sclera non-icteric        Nose:   Nares without drainage or epistaxis. Mucosa, turbinates normal  Throat:   Moist mucous membranes. Oropharynx without erythema or exudate.  Neck:   Supple. No carotid bruits.  No thyromegaly.  No lymphadenopathy.   Back:     No CVA tenderness, no spinal tenderness  Lungs:     Clear to auscultation bilaterally, without wheezes, rhonchi or rales  Chest wall:    No tenderness to palpitation  Heart:    Regular rate and rhythm without murmurs, gallops, rubs  Abdomen:     Soft, non-tender, nondistended, normal bowel sounds, no organomegaly  Genitalia:    deferred  Rectal:    deferred  Extremities:   No clubbing, cyanosis or edema.  Pulses:   2+ and symmetric all extremities, faint in LLE  foot  Skin:   Dusky / dry necrotic appearance of the left 1st 4th and 5th toes, per family slightly worse than it was at discharge from [facility].  Lymph nodes:   Cervical, supraclavicular, and axillary nodes normal  Neurologic:   CNII-XII intact. Normal strength, sensation and reflexes      throughout    Labs on Admission:  Basic Metabolic Panel:  Recent Labs Lab 01/17/14 1741  NA 138  K 3.9  CL 105  CO2 19  GLUCOSE 91  BUN 13  CREATININE 1.55*  CALCIUM 7.9*   Liver Function Tests: No results found for this basename: AST, ALT, ALKPHOS, BILITOT, PROT, ALBUMIN,  in the last 168 hours No results found for this basename: LIPASE, AMYLASE,  in the last 168 hours No results found for this basename: AMMONIA,  in the last 168 hours CBC:  Recent Labs Lab 01/17/14 1741  WBC 7.3  NEUTROABS 3.8  HGB 8.9*  HCT 27.5*  MCV 85.1  PLT 225   Cardiac Enzymes: No results found for this basename: CKTOTAL, CKMB, CKMBINDEX, TROPONINI,  in the last 168 hours  BNP (last 3 results) No results found for this basename: PROBNP,  in the last 8760 hours CBG: No results found for this basename: GLUCAP,  in the last 168 hours  Radiological Exams on Admission: Dg Foot Complete Left  01/17/2014   CLINICAL DATA:  Pain in left foot and toes for 1 month. Discoloration of the fourth and fifth toes.  EXAM: LEFT FOOT - COMPLETE 3+ VIEW  COMPARISON:  CT EXTREM LOW W/O CM*L* dated 02/25/2013; DG FOOT COMPLETE 3+V*L* dated 02/19/2013  FINDINGS: The bones are diffusely demineralized. There is no evidence of acute fracture, dislocation or bone destruction. No focal soft tissue swelling or soft tissue emphysema identified.  IMPRESSION: Generalized osteopenia. No evidence of acute fracture or osteomyelitis.   Electronically Signed   By: Camie Patience M.D.   On: 01/17/2014 17:08    EKG: Independently reviewed.  Assessment/Plan Principal Problem:   Critical lower limb ischemia   1. Critical lower limb ischemia -  suspect patient had thromboembolic or cholesterol embolic event prior to admission to [facility] which has caused her symptoms.  See below. 2. Renal insufficiency - creatinine 1.55, unclear on what her baseline is, but suspect that this may be chronic and baseline following her history of kidney stones, bladder resection, etc.  No BUN elevation to suggest pre-renal azotemia.  Dr. Bridgett Larsson has been formally consulted and recommends admission to [facility], NPO, heparin gtt, AM consult, likely will end up with angiogram if renal function is stable.  Code Status: Full  Family Communication: Son  at bedside Disposition Plan: Admit to inpatient   Time spent: 70 min  GARDNER, JARED M. [facility] Pager [phone]  If 7AM-7PM, please contact the day team taking care of the patient [email or web address] Password [facility] 01/17/2014, 9:14 PM

## 2014-01-18 ENCOUNTER — Encounter (HOSPITAL_COMMUNITY): Payer: Self-pay | Admitting: *Deleted

## 2014-01-18 DIAGNOSIS — I743 Embolism and thrombosis of arteries of the lower extremities: Secondary | ICD-10-CM

## 2014-01-18 DIAGNOSIS — E43 Unspecified severe protein-calorie malnutrition: Secondary | ICD-10-CM | POA: Insufficient documentation

## 2014-01-18 DIAGNOSIS — I999 Unspecified disorder of circulatory system: Secondary | ICD-10-CM

## 2014-01-18 DIAGNOSIS — I359 Nonrheumatic aortic valve disorder, unspecified: Secondary | ICD-10-CM

## 2014-01-18 LAB — BASIC METABOLIC PANEL
BUN: 10 mg/dL (ref 6–23)
CALCIUM: 7.8 mg/dL — AB (ref 8.4–10.5)
CO2: 21 mEq/L (ref 19–32)
Chloride: 111 mEq/L (ref 96–112)
Creatinine, Ser: 1.25 mg/dL — ABNORMAL HIGH (ref 0.50–1.10)
GFR, EST AFRICAN AMERICAN: 52 mL/min — AB (ref >90)
GFR, EST NON AFRICAN AMERICAN: 45 mL/min — AB (ref >90)
Glucose, Bld: 79 mg/dL (ref 70–99)
Potassium: 4.8 mEq/L (ref 3.7–5.3)
SODIUM: 142 meq/L (ref 137–147)

## 2014-01-18 LAB — CBC
HCT: 25 % — ABNORMAL LOW (ref 36.0–46.0)
Hemoglobin: 7.9 g/dL — ABNORMAL LOW (ref 12.0–15.0)
MCH: 27.6 pg (ref 26.0–34.0)
MCHC: 31.6 g/dL (ref 30.0–36.0)
MCV: 87.4 fL (ref 78.0–100.0)
Platelets: 159 10*3/uL (ref 150–400)
RBC: 2.86 MIL/uL — ABNORMAL LOW (ref 3.87–5.11)
RDW: 16.5 % — AB (ref 11.5–15.5)
WBC: 5.6 10*3/uL (ref 4.0–10.5)

## 2014-01-18 LAB — HEPARIN LEVEL (UNFRACTIONATED)
HEPARIN UNFRACTIONATED: 0.27 [IU]/mL — AB (ref 0.30–0.70)
Heparin Unfractionated: 0.27 IU/mL — ABNORMAL LOW (ref 0.30–0.70)

## 2014-01-18 MED ORDER — LINEZOLID 2 MG/ML IV SOLN
600.0000 mg | Freq: Two times a day (BID) | INTRAVENOUS | Status: DC
  Filled 2014-01-18 (×2): qty 300

## 2014-01-18 MED ORDER — LINEZOLID 600 MG PO TABS
600.0000 mg | ORAL_TABLET | Freq: Two times a day (BID) | ORAL | Status: AC
  Administered 2014-01-18 – 2014-01-21 (×8): 600 mg via ORAL
  Filled 2014-01-18 (×9): qty 1

## 2014-01-18 NOTE — Progress Notes (Signed)
Echo Lab  2D Echocardiogram completed.  [facility], RDCS 01/18/2014 11:32 AM

## 2014-01-18 NOTE — Progress Notes (Signed)
ANTICOAGULATION CONSULT NOTE - Follow Up Consult  Pharmacy Consult for heparin Indication: DVT  Allergies  Allergen Reactions  . Codeine     REACTION: intolerance    Patient Measurements: Height: 5\' 2"  (157.5 cm) Weight: 81 lb (36.74 kg) IBW/kg (Calculated) : 50.1  Vital Signs:    Labs:  Recent Labs  01/17/14 1741 01/17/14 2126 01/18/14 0945 01/18/14 1909  HGB 8.9*  --  7.9*  --   HCT 27.5*  --  25.0*  --   PLT 225  --  159  --   APTT  --  31  --   --   LABPROT  --  13.1  --   --   INR  --  1.01  --   --   HEPARINUNFRC  --   --  0.27* 0.27*  CREATININE 1.55*  --  1.25*  --     Estimated Creatinine Clearance: 27 ml/min (by C-G formula based on Cr of 1.25).   Medications:  Scheduled:  . amLODipine  5 mg Oral Daily  . clopidogrel  75 mg Oral Q breakfast  . donepezil  10 mg Oral QHS  . linezolid  600 mg Oral Q12H  . magnesium oxide  400 mg Oral Daily  . metoprolol tartrate  25 mg Oral BID  . phenytoin  200 mg Oral QHS  . QUEtiapine  25 mg Oral QPM  . sodium chloride  3 mL Intravenous Q12H   Infusions:  . sodium chloride 75 mL/hr at 01/17/14 2151  . heparin 750 Units/hr (01/17/14 2254)    Assessment: 68 YOF on IV heparin for left foot thromboembolic event with possible chronic PVD.  After increase to 800 units/hr heparin level is 0.27 and no change from prior level.   Goal of Therapy:  Heparin level 0.3-0.7 units/ml Monitor platelets by anticoagulation protocol: Yes   Plan:   -Increase heparin to 850 units/hr -Heparin level and CBC in am  Hildred Laser, Pharm D 01/18/2014 7:59 PM

## 2014-01-18 NOTE — Progress Notes (Signed)
[facility] progress note. Chief complaint. Transfer note. History of present illness. 63 year old female presented to [facility] emergency room with left foot pain. Pain located on the first, fourth, and fifth toes of the left foot. There is associated bluish purple discoloration of the toes. He felt to have critical lower limb ischemia suspected thrombo-embolic or cholesterol emboli. Doctor Bridgett Larsson vascular surgery was consulted and requested admission [facility]. The patient is now arrived am seeing her at bedside to ensure continued medical stability. Other than a painful left foot patient has no current complaints. Physical exam. Vital signs. Temperature 97.9, pulse 52, respiration 20, blood pressure 147/57, O2 sats 99%. General appearance. Thin elderly female who is alert, cooperative, in no distress. Cardiac. Rate and rhythm regular. Lungs. Breath sounds are clear and equal. Abdomen. Soft with positive bowel sounds. No pain. Extremities. Cannot palpate pedal pulses. Purplish blue discoloration of the first, fourth, and fifth toes of the left foot. No edema present. Impression/plan. Problem #1. Critical lower limb ischemia. Thrombolytics versus cholesterol embolic event. Doctor Bridgett Larsson vascular surgery will be following. Patient n.p.o. and on a heparin drip. Will likely need a angiogram if renal function remained stable. Problem #2. Recent UTI/pyelonephritis. Patient's family indicates that during a recent hospitalization at [facility] she was started on Zyvox 600 mg twice a day for 10 days. Antibiotic therapy started on 01/12/14. I discussed with Doctor Johnnye Sima infectious disease who authorized continuation of Zyvox to have put in for an additional 9 doses. Patient appears clinically stable and all orders appear to of transferred appropriately.

## 2014-01-18 NOTE — Progress Notes (Signed)
VASCULAR LAB PRELIMINARY  ARTERIAL  ABI completed:  ABIs and pedal waveforms within normal limits.  Duplex imaging shows minimal plaque, no stenosis.    RIGHT    LEFT    PRESSURE WAVEFORM  PRESSURE WAVEFORM  BRACHIAL 106 Triphasic  BRACHIAL 102 Triphasic   DP   DP    AT 116 Biphasic  AT 103 Triphasic  PT 127 Biphasic  PT 125 Triphasic   PER   PER    GREAT TOE  NA GREAT TOE  NA    RIGHT LEFT  ABI >1 >1     Garnell Phenix, RVT 01/18/2014, 3:20 PM

## 2014-01-18 NOTE — Progress Notes (Signed)
PROGRESS NOTE  [name] [id number] DOB: 04/04/1951 DOA: 01/17/2014 PCP: Teressa Lower, MD  HPI: [name] is a 63 y.o. female who presents to the ED with L foot pain. Pain is located in her L foot and especially her 1st, 4th and 5th L toes. Associated with discoloration of toes. Was admitted to [facility] recently for same, diagnosed with "blood clot" in leg, and discharged, follow up with vascular surgeon in [place] but not for another 8 days. Due to pain severity patient presented to ED.  Assessment/Plan: Digits ischemia - vascular surgery consulted, appreciate input, workup per Dr. Bridgett Larsson.  - continue anticoagulation Previous UTI - continue Zyvox History of dementia/memory loss - Seen by Dr. Brett Fairy from [facility]. Continue home medications.  Acute renal failure - unclear baseline, monitor with hydration.  Anemia - Hb normal in 2008, decreased to 10.4 in 2009; iron studies last checked in 2009; check iron studies now, no frank evidence of bleeding, check FOBT. Close monitoring since she is anticoagulated.  History of C diff - not currently taking fidaxomycin as an outpatient. This was verified by pharmacy, appreciate input. She stopped this treatment a while ago. Started on admission, will discontinue. Monitor for diarrhea.  Diet: NPO Fluids: NS DVT Prophylaxis: A/c  Code Status: Full Family Communication: none  Disposition Plan: inpatient  Consultants:  Vascular Surgery  Procedures:  none   Antibiotics  Anti-infectives   Start     Dose/Rate Route Frequency Ordered Stop   01/18/14 0300  linezolid (ZYVOX) tablet 600 mg     600 mg Oral Every 12 hours 01/18/14 0246 01/22/14 0959   01/18/14 0215  linezolid (ZYVOX) IVPB 600 mg  Status:  Discontinued     600 mg 300 mL/hr over 60 Minutes Intravenous Every 12 hours 01/18/14 0149 01/18/14 0246   01/17/14 2200  fidaxomicin (DIFICID) tablet 200 mg     200 mg Oral 2 times daily 01/17/14 2054       Antibiotics Given (last 72  hours)   Date/Time Action Medication Dose   01/17/14 2358 Given   fidaxomicin (DIFICID) tablet 200 mg 200 mg   01/18/14 0442 Given   linezolid (ZYVOX) tablet 600 mg 600 mg      HPI/Subjective: No complaints, has pain in her digits but otherwise feels well.   Objective: Filed Vitals:   01/17/14 2151 01/18/14 0003 01/18/14 0113 01/18/14 0447  BP: 161/67 146/70 147/57 127/82  Pulse:  60 52 54  Temp:   97.9 F (36.6 C) 97.8 F (36.6 C)  TempSrc:   Oral Oral  Resp:  18 20 20   Height:   5\' 2"  (1.575 m)   Weight:   36.74 kg (81 lb)   SpO2:  97% 99% 99%    Intake/Output Summary (Last 24 hours) at 01/18/14 0930 Last data filed at 01/18/14 0400  Gross per 24 hour  Intake      0 ml  Output   1025 ml  Net  -1025 ml   Filed Weights   01/17/14 2144 01/18/14 0113  Weight: 36.741 kg (81 lb) 36.74 kg (81 lb)    Exam:   General:  NAD  Cardiovascular: regular rate and rhythm, without MRG  Respiratory: good air movement, clear to auscultation throughout, no wheezing, ronchi or rales  Abdomen: soft, not tender to palpation, positive bowel sounds  MSK: no peripheral edema; digits 4,5 cold, tender to palpation, dark blue discoloration.   Neuro: non focal  Data Reviewed: Basic Metabolic Panel:  Recent  Labs Lab 01/17/14 1741 01/18/14 0945  NA 138 142  K 3.9 4.8  CL 105 111  CO2 19 21  GLUCOSE 91 79  BUN 13 10  CREATININE 1.55* 1.25*  CALCIUM 7.9* 7.8*   CBC:  Recent Labs Lab 01/17/14 1741  WBC 7.3  NEUTROABS 3.8  HGB 8.9*  HCT 27.5*  MCV 85.1  PLT 225     Studies: Dg Foot Complete Left  01/17/2014   CLINICAL DATA:  Pain in left foot and toes for 1 month. Discoloration of the fourth and fifth toes.  EXAM: LEFT FOOT - COMPLETE 3+ VIEW  COMPARISON:  CT EXTREM LOW W/O CM*L* dated 02/25/2013; DG FOOT COMPLETE 3+V*L* dated 02/19/2013  FINDINGS: The bones are diffusely demineralized. There is no evidence of acute fracture, dislocation or bone destruction. No focal  soft tissue swelling or soft tissue emphysema identified.  IMPRESSION: Generalized osteopenia. No evidence of acute fracture or osteomyelitis.   Electronically Signed   By: Camie Patience M.D.   On: 01/17/2014 17:08    Scheduled Meds: . amLODipine  5 mg Oral Daily  . clopidogrel  75 mg Oral Q breakfast  . donepezil  10 mg Oral QHS  . fidaxomicin  200 mg Oral BID  . linezolid  600 mg Oral Q12H  . magnesium oxide  400 mg Oral Daily  . metoprolol tartrate  25 mg Oral BID  . phenytoin  200 mg Oral QHS  . QUEtiapine  25 mg Oral QPM  . sodium chloride  3 mL Intravenous Q12H   Continuous Infusions: . sodium chloride 75 mL/hr at 01/17/14 2151  . heparin 750 Units/hr (01/17/14 2254)    Principal Problem:   Critical lower limb ischemia   Time spent: [place], MD [facility] Pager [phone]. If 7 PM - 7 AM, please contact night-coverage at [email or web address], password [email or web address] 01/18/2014, 9:30 AM  LOS: 1 day

## 2014-01-18 NOTE — Consult Note (Addendum)
[facility] NOTE   MRN : [id number]  Reason for Consult: left leg ischemia Referring Physician: Etta Quill, DO   History of Present Illness: 63 y/o female admitted secondary to left foot pain associated with discoloration.  Her foot pain started 3 weeks ago and the toe darkness discoloration has progressed over the past few days.  She reports no history of claudication symptoms, decreased sensation or injury.  Past medical history includes hypertension and  Alzheimer's dementia.  She denies heart disease, hypercholesterolemia and diabetes.  She takes metoprolol and is on Plavix since she was seen a few weeks ago at in [facility] with her foot pain.  She has a history of intermittent cardiac arrhythmia.  Her risk factors of atherosclerosis include: active smoking.  Pt notes unintentional weight loss over the few months.  Current Facility-Administered Medications  Medication Dose Route Frequency Provider Last Rate Last Dose  . 0.9 %  sodium chloride infusion   Intravenous Continuous Etta Quill, DO 75 mL/hr at 01/17/14 2151    . amLODipine (NORVASC) tablet 5 mg  5 mg Oral Daily Etta Quill, DO   5 mg at 01/17/14 2358  . clopidogrel (PLAVIX) tablet 75 mg  75 mg Oral Q breakfast Etta Quill, DO      . donepezil (ARICEPT) tablet 10 mg  10 mg Oral QHS Etta Quill, DO   10 mg at 01/17/14 2358  . fidaxomicin (DIFICID) tablet 200 mg  200 mg Oral BID Etta Quill, DO   200 mg at 01/17/14 2358  . heparin ADULT infusion 100 units/mL (25000 units/250 mL)  750 Units/hr Intravenous Continuous Etta Quill, DO 7.5 mL/hr at 01/17/14 2254 750 Units/hr at 01/17/14 2254  . HYDROmorphone (DILAUDID) injection 1 mg  1 mg Intravenous Q2H PRN Etta Quill, DO   1 mg at 01/18/14 [id number]  . ibuprofen (ADVIL,MOTRIN) tablet 600 mg  600 mg Oral Q6H PRN Etta Quill, DO      . linezolid (ZYVOX) tablet 600 mg  600 mg Oral Q12H Dianne Dun,  NP   600 mg at 01/18/14 0442  . magnesium oxide (MAG-OX) tablet 400 mg  400 mg Oral Daily Etta Quill, DO   400 mg at 01/17/14 2359  . metoprolol tartrate (LOPRESSOR) tablet 25 mg  25 mg Oral BID Etta Quill, DO   25 mg at 01/17/14 2148  . phenytoin (DILANTIN) ER capsule 200 mg  200 mg Oral QHS Etta Quill, DO   200 mg at 01/17/14 2149  . QUEtiapine (SEROQUEL) tablet 25 mg  25 mg Oral QPM Etta Quill, DO   25 mg at 01/18/14 0245  . sodium chloride 0.9 % injection 3 mL  3 mL Intravenous Q12H Etta Quill, DO        Pt meds include: Statin :No Betablocker: Yes ASA: No Other anticoagulants/antiplatelets: Plavix  Past Medical History  Diagnosis Date  . Renal disorder     kidney stones  . Subacute delirium 10/14/2013  . Alzheimer's dementia   . Toxic encephalopathy   . UTI (lower urinary tract infection)     Past Surgical History  Procedure Laterality Date  . Abdominal hysterectomy    . Knee surgery    . Bladder surgery      Social History History  Substance Use Topics  . Smoking status: Former Smoker -- 1.00 packs/day    Types: Cigarettes  . Smokeless tobacco: Not  on file  . Alcohol Use: No    Family History Pt cannot recall any medical problems with her parents  Allergies  Allergen Reactions  . Codeine     REACTION: intolerance    REVIEW OF SYSTEMS  General: [ ]  Weight loss, [ ]  Fever, [ ]  chills Neurologic: [ ]  Dizziness, [ ]  Blackouts, [ ]  Seizure [ ]  Stroke, [ ]  "Mini stroke", [ ]  Slurred speech, [ ]  Temporary blindness; [ ]  weakness in arms or legs, [ ]  Hoarseness [ ]  Dysphagia [x]  alzheimer's   Cardiac: [ ]  Chest pain/pressure, [ ]  Shortness of breath at rest [ ]  Shortness of breath with exertion, [ ]  Atrial fibrillation or irregular heartbeat  Vascular: [ ]  Pain in legs with walking, [x ] Pain in legs at rest, [ ]  Pain in legs at night,  [x ] Non-healing ulcer, [ ]  Blood clot in vein/DVT,   Pulmonary: [ ]  Home oxygen, [ ]  Productive  cough, [ ]  Coughing up blood, [ ]  Asthma,  [ ]  Wheezing [ ]  COPD Musculoskeletal:  [ ]  Arthritis, [ ]  Low back pain, [ ]  Joint pain Hematologic: [ ]  Easy Bruising, [ ]  Anemia; [ ]  Hepatitis Gastrointestinal: [ ]  Blood in stool, [ ]  Gastroesophageal Reflux/heartburn, Urinary: [x ] chronic Kidney disease, [ ]  on HD - [ ]  MWF or [ ]  TTHS, [ ]  Burning with urination, [x ] Difficulty urinating Skin: [ ]  Rashes, [x ] Wounds Psychological: [ ]  Anxiety, [ ]  Depression  Physical Examination Filed Vitals:   01/17/14 2151 01/18/14 0003 01/18/14 0113 01/18/14 0447  BP: 161/67 146/70 147/57 127/82  Pulse:  60 52 54  Temp:   97.9 F (36.6 C) 97.8 F (36.6 C)  TempSrc:   Oral Oral  Resp:  18 20 20   Height:   5\' 2"  (1.575 m)   Weight:   81 lb (36.74 kg)   SpO2:  97% 99% 99%   Body mass index is 14.81 kg/(m^2).  General:  WDWN in NAD HENT: WNL Eyes: Pupils equal Pulmonary: normal non-labored breathing , without Rales, rhonchi,  wheezing Cardiac: RRR, without  Murmurs, rubs or gallops; No carotid bruits Abdomen: soft, NT, no masses Skin: no rashes, ulcers noted digits 1,4, and 5 cool with bluish/black discoloration painful to touch ;  no Gangrene , no cellulitis; no open wounds;  Vascular Exam/Pulses:Palpable femoral, popliteal pulses.  Biphasic doppler peroneal, PT/DP signal. Musculoskeletal: no muscle wasting or atrophy; no edema  Neurologic: A&O X 3; Appropriate Affect ; SENSATION: normal; MOTOR FUNCTION: 5/5 Symmetric; Speech is fluent/normal Psych: judgement inact, mood and affect appropritate Lymph: no palpable cervical or inguinal LAD  Significant Diagnostic Studies: CBC Lab Results  Component Value Date   WBC 7.3 01/17/2014   HGB 8.9* 01/17/2014   HCT 27.5* 01/17/2014   MCV 85.1 01/17/2014   PLT 225 01/17/2014    BMET    Component Value Date/Time   NA 138 01/17/2014 1741   K 3.9 01/17/2014 1741   CL 105 01/17/2014 1741   CO2 19 01/17/2014 1741   GLUCOSE 91 01/17/2014 1741   BUN 13  01/17/2014 1741   CREATININE 1.55* 01/17/2014 1741   CALCIUM 7.9* 01/17/2014 1741   GFRNONAA 35* 01/17/2014 1741   GFRAA 40* 01/17/2014 1741   Estimated Creatinine Clearance: 21.8 ml/min (by C-G formula based on Cr of 1.55).  COAG Lab Results  Component Value Date   INR 1.01 01/17/2014   INR 1.0 10/10/2007   Radiology: Dg Foot  Complete Left  01/17/2014   CLINICAL DATA:  Pain in left foot and toes for 1 month. Discoloration of the fourth and fifth toes.  EXAM: LEFT FOOT - COMPLETE 3+ VIEW  COMPARISON:  CT EXTREM LOW W/O CM*L* dated 02/25/2013; DG FOOT COMPLETE 3+V*L* dated 02/19/2013  FINDINGS: The bones are diffusely demineralized. There is no evidence of acute fracture, dislocation or bone destruction. No focal soft tissue swelling or soft tissue emphysema identified.  IMPRESSION: Generalized osteopenia. No evidence of acute fracture or osteomyelitis.   Electronically Signed   By: Camie Patience M.D.   On: 01/17/2014 17:08    Non-Invasive Vascular Imaging: pending ABI's and Left LE arterial duplex.   ASSESSMENT/PLAN:  Left foot with likely embolic event to digits 1, 4, and 5.  She has good arterial flow to her foot via doppler biphasic signals.  Pending further studies duplex and ABI  Theda Sers, EMMA [facility] 01/18/2014 7:29 AM  Addendum  I have independently interviewed and examined the patient, and I agree with the physician assistant's findings.  Pt's history suspicious for thromboembolic event with possible chronic peripheral arterial disease.     ABI and LLE arterial duplex ordered.    Echocardiogram to evaluate for intra-cardiac thrombus ordered.    CTA chest/abd/pelvis +/- Aortogram with bilateral leg runoff will likely be needed, but I would wait until ABI and LLE arterial duplex comes back first.    Ok to switch patient to Lovenox at this point.  Would not start Coumadin until work-up completed.  [name], MD [facility] [place] Office:  [phone] Pager: [phone]  01/18/2014, 9:39 AM

## 2014-01-18 NOTE — Progress Notes (Signed)
ANTICOAGULATION CONSULT NOTE - Initial Consult  Pharmacy Consult for Heparin Indication: DVT  Allergies  Allergen Reactions  . Codeine     REACTION: intolerance    Patient Measurements: Height: 5\' 2"  (157.5 cm) Weight: 81 lb (36.74 kg) IBW/kg (Calculated) : 50.1 Heparin Dosing Weight: 36.7 kg  Vital Signs: Temp: 97.8 F (36.6 C) (02/09 0447) Temp src: Oral (02/09 0447) BP: 127/82 mmHg (02/09 0447) Pulse Rate: 54 (02/09 0447)  Labs:  Recent Labs  01/17/14 1741 01/17/14 2126 01/18/14 0945  HGB 8.9*  --  7.9*  HCT 27.5*  --  25.0*  PLT 225  --  159  APTT  --  31  --   LABPROT  --  13.1  --   INR  --  1.01  --   HEPARINUNFRC  --   --  0.27*  CREATININE 1.55*  --  1.25*    Estimated Creatinine Clearance: 27 ml/min (by C-G formula based on Cr of 1.25).   Medical History: Past Medical History  Diagnosis Date  . Renal disorder     kidney stones  . Subacute delirium 10/14/2013  . Alzheimer's dementia   . Toxic encephalopathy   . UTI (lower urinary tract infection)     Medications:  Prescriptions prior to admission  Medication Sig Dispense Refill  . amLODipine (NORVASC) 5 MG tablet Take 5 mg by mouth daily.      . clopidogrel (PLAVIX) 75 MG tablet Take 75 mg by mouth daily with breakfast.      . donepezil (ARICEPT) 10 MG tablet Take 1 tablet (10 mg total) by mouth at bedtime.  90 tablet  1  . fidaxomicin (DIFICID) 200 MG TABS tablet Take 200 mg by mouth 2 (two) times daily.      Marland Kitchen ibuprofen (ADVIL,MOTRIN) 200 MG tablet Take 600 mg by mouth every 6 (six) hours as needed for headache or moderate pain (PAIN).       . magnesium oxide (MAG-OX) 400 MG tablet Take 400 mg by mouth daily.      . metoprolol tartrate (LOPRESSOR) 25 MG tablet Take 25 mg by mouth 2 (two) times daily.      . phenytoin (DILANTIN) 100 MG ER capsule Take 2 capsules (200 mg total) by mouth at bedtime.  90 capsule  1  . QUEtiapine (SEROQUEL) 25 MG tablet Take 1 tablet (25 mg total) by mouth every  evening.  90 tablet  1  . traMADol (ULTRAM-ER) 100 MG 24 hr tablet Take 100 mg by mouth 4 (four) times daily.      Marland Kitchen zolpidem (AMBIEN) 10 MG tablet Take 1 tablet (10 mg total) by mouth at bedtime as needed for sleep. Take one half tab by mouth prn insomnia.  30 tablet  0   Infusions:  . sodium chloride 75 mL/hr at 01/17/14 2151  . heparin 750 Units/hr (01/17/14 2254)    Assessment: 61 YOF on IV heparin for left foot thromboembolic event with possible chronic PVD. Heparin level slightly bellow goal at 0.27 on 750 units/hr, vascular surgery following, likely will need angiogram, so will not start coumadin for now. Hgb 7.9, plt 159, No bleeding noted per chart.   Goal of Therapy:  Heparin level 0.3-0.7 units/ml Monitor platelets by anticoagulation protocol: Yes   Plan:  Increase heparin drip slightly to 800 units/hr Daily heparin level and CBC F/u heparin level at [place], PharmD, BCPS  Clinical Pharmacist  Pager: [phone]   01/18/2014,10:56 AM

## 2014-01-18 NOTE — Progress Notes (Signed)
INITIAL NUTRITION ASSESSMENT  DOCUMENTATION CODES Per approved criteria  -Severe malnutrition in the context of chronic illness -Underweight   INTERVENTION: Advance diet as medically appropriate, add interventions when/as able RD to follow for nutrition care plan  NUTRITION DIAGNOSIS: Inadequate oral intake related to inability to eat as evidenced by NPO status  Goal: Pt to meet >/= 90% of their estimated nutrition needs   Monitor:  PO & supplemental intake, weight, labs, I/O's  Reason for Assessment: BMI < 18.5, Zyvox  63 y.o. female  Admitting Dx: Critical lower limb ischemia  ASSESSMENT: Patient with PMH of renal disorder, dementia and UTI; presented to the ED with L foot pain.  Patient currently NPO for test/procedure; reports her appetite hasn't been very good for the past month PTA; states some days she would not eat anything at all; patient with visible severe muscle & subcutaneous fat loss to upper body; per weight readings, patient's weight has been fairly stable; drinks Ensure supplements at home; RD order during this hospitalization when/as able.  Nutrition Focused Physical Exam:  Subcutaneous Fat:  Orbital Region: moderate depletion  Upper Arm Region: severe depletion Thoracic and Lumbar Region: N/A  Muscle:  Temple Region: moderate depletion  Clavicle Bone Region: severe depletion Clavicle and Acromion Bone Region: severe depletion Scapular Bone Region: N/A Dorsal Hand: N/A Patellar Region: N/A Anterior Thigh Region: N/A Posterior Calf Region: N/A  Edema: none  Patient meets criteria for severe malnutrition in the context of chronic illness as evidenced by < 75% intake of estimated energy requirement for > 1 month, severe muscle loss and severe subcutaneous fat loss.  Patient is currently receiving Zyvox, a medication with potential interactions with high tyramine-containing foods.  Hospitalized diet (once advanced) is not high in tyramine and poses no  risk for patient once advanced.  Height: Ht Readings from Last 1 Encounters:  01/18/14 5\' 2"  (1.575 m)    Weight: Wt Readings from Last 1 Encounters:  01/18/14 81 lb (36.74 kg)    Ideal Body Weight: 110 lb  % Ideal Body Weight: 74%  Wt Readings from Last 10 Encounters:  01/18/14 81 lb (36.74 kg)  10/14/13 87 lb (39.463 kg)  11/30/08 98 lb 4 oz (44.566 kg)  10/28/08 92 lb (41.731 kg)    Usual Body Weight: 87 lb -- November 2014  % Usual Body Weight: 93%  BMI:  Body mass index is 14.81 kg/(m^2).  Estimated Nutritional Needs: Kcal: 1300-1500 Protein: 65-75 gm Fluid: >/= 1.5 L  Skin: Intact  Diet Order: NPO  EDUCATION NEEDS: -No education needs identified at this time   Intake/Output Summary (Last 24 hours) at 01/18/14 1143 Last data filed at 01/18/14 0400  Gross per 24 hour  Intake      0 ml  Output   1025 ml  Net  -1025 ml    Labs:   Recent Labs Lab 01/17/14 1741 01/18/14 0945  NA 138 142  K 3.9 4.8  CL 105 111  CO2 19 21  BUN 13 10  CREATININE 1.55* 1.25*  CALCIUM 7.9* 7.8*  GLUCOSE 91 79    Scheduled Meds: . amLODipine  5 mg Oral Daily  . clopidogrel  75 mg Oral Q breakfast  . donepezil  10 mg Oral QHS  . fidaxomicin  200 mg Oral BID  . linezolid  600 mg Oral Q12H  . magnesium oxide  400 mg Oral Daily  . metoprolol tartrate  25 mg Oral BID  . phenytoin  200 mg Oral QHS  .  QUEtiapine  25 mg Oral QPM  . sodium chloride  3 mL Intravenous Q12H    Continuous Infusions: . sodium chloride 75 mL/hr at 01/17/14 2151  . heparin 750 Units/hr (01/17/14 2254)    Past Medical History  Diagnosis Date  . Renal disorder     kidney stones  . Subacute delirium 10/14/2013  . Alzheimer's dementia   . Toxic encephalopathy   . UTI (lower urinary tract infection)     Past Surgical History  Procedure Laterality Date  . Abdominal hysterectomy    . Knee surgery    . Bladder surgery      Arthur Holms, RD, LDN Pager #: [phone] After-Hours  Pager #: [phone]

## 2014-01-19 ENCOUNTER — Encounter (HOSPITAL_COMMUNITY): Payer: Self-pay | Admitting: *Deleted

## 2014-01-19 ENCOUNTER — Inpatient Hospital Stay (HOSPITAL_COMMUNITY): Payer: Medicare Other

## 2014-01-19 LAB — BASIC METABOLIC PANEL
BUN: 12 mg/dL (ref 6–23)
CALCIUM: 7.7 mg/dL — AB (ref 8.4–10.5)
CO2: 20 meq/L (ref 19–32)
Chloride: 114 mEq/L — ABNORMAL HIGH (ref 96–112)
Creatinine, Ser: 1.43 mg/dL — ABNORMAL HIGH (ref 0.50–1.10)
GFR calc Af Amer: 44 mL/min — ABNORMAL LOW (ref >90)
GFR calc non Af Amer: 38 mL/min — ABNORMAL LOW (ref >90)
GLUCOSE: 83 mg/dL (ref 70–99)
Potassium: 4.7 mEq/L (ref 3.7–5.3)
SODIUM: 144 meq/L (ref 137–147)

## 2014-01-19 LAB — CBC
HCT: 25.8 % — ABNORMAL LOW (ref 36.0–46.0)
Hemoglobin: 7.9 g/dL — ABNORMAL LOW (ref 12.0–15.0)
MCH: 27 pg (ref 26.0–34.0)
MCHC: 30.6 g/dL (ref 30.0–36.0)
MCV: 88.1 fL (ref 78.0–100.0)
PLATELETS: 164 10*3/uL (ref 150–400)
RBC: 2.93 MIL/uL — ABNORMAL LOW (ref 3.87–5.11)
RDW: 16.7 % — AB (ref 11.5–15.5)
WBC: 5.4 10*3/uL (ref 4.0–10.5)

## 2014-01-19 LAB — IRON AND TIBC
IRON: 84 ug/dL (ref 42–135)
Saturation Ratios: 61 % — ABNORMAL HIGH (ref 20–55)
TIBC: 137 ug/dL — ABNORMAL LOW (ref 250–470)
UIBC: 53 ug/dL — ABNORMAL LOW (ref 125–400)

## 2014-01-19 LAB — HEPARIN LEVEL (UNFRACTIONATED): HEPARIN UNFRACTIONATED: 0.35 [IU]/mL (ref 0.30–0.70)

## 2014-01-19 LAB — FERRITIN: Ferritin: 120 ng/mL (ref 10–291)

## 2014-01-19 MED ORDER — IOHEXOL 350 MG/ML SOLN
80.0000 mL | Freq: Once | INTRAVENOUS | Status: AC | PRN
  Administered 2014-01-19: 80 mL via INTRAVENOUS

## 2014-01-19 MED ORDER — HYDROMORPHONE HCL PF 1 MG/ML IJ SOLN
INTRAMUSCULAR | Status: AC
  Administered 2014-01-19: 1 mg via INTRAVENOUS
  Filled 2014-01-19: qty 1

## 2014-01-19 NOTE — Progress Notes (Signed)
[facility] PROGRESS NOTE  KRISTYL FANTA [id number] DOB: 07/18/1951 DOA: 01/17/2014 PCP: Teressa Lower, MD  Assessment/Plan: Digits ischemia - vascular surgery consulted, appreciate input, workup per Dr. Bridgett Larsson.  - continue anticoagulation  - Noninvasive studies consistent with thromboembolic activity to left foot  Previous UTI  - continue Zyvox   History of dementia/memory loss - Seen by Dr. Brett Fairy from [place]. Continue home medications.   Acute renal failure - unclear baseline, monitor with hydration.   Anemia - Hb normal in 2008, decreased to 10.4 in 2009; iron studies last checked in 2009; iron studies reviewed, no frank evidence of bleeding, check FOBT. Close monitoring since she is anticoagulated.   History of C diff - not currently taking fidaxomycin as an outpatient. This was verified by pharmacy, appreciate input. Monitor for diarrhea   Code Status: full Family Communication: Discussed directly with patient Disposition Plan: Per Vascular surgeon recommendations   Consultants:  Vascular surgery  Procedures:  AKI  LE BL duplex   Antibiotics:  Zyvox  HPI/Subjective: Patient is sad about her toes feeling worse.  Has had discomfort at her toes. Otherwise no new complaints.  Objective: Filed Vitals:   01/19/14 1438  BP: 120/46  Pulse: 63  Temp: 97.3 F (36.3 C)  Resp: 18    Intake/Output Summary (Last 24 hours) at 01/19/14 1742 Last data filed at 01/19/14 0600  Gross per 24 hour  Intake   1192 ml  Output    500 ml  Net    692 ml   Filed Weights   01/17/14 2144 01/18/14 0113  Weight: 36.741 kg (81 lb) 36.74 kg (81 lb)    Exam:   General:  Pt in NAD, Alert and awake  Cardiovascular: RRR, no MRG  Respiratory: CTA BL, no wheezes  Abdomen: soft, ND  Musculoskeletal:  Dry necrotic appearance of the left 1rst, 4th, and 5th toes  Data Reviewed: Basic Metabolic Panel:  Recent Labs Lab 01/17/14 1741 01/18/14 0945 01/19/14 0243  NA 138  142 144  K 3.9 4.8 4.7  CL 105 111 114*  CO2 19 21 20   GLUCOSE 91 79 83  BUN 13 10 12   CREATININE 1.55* 1.25* 1.43*  CALCIUM 7.9* 7.8* 7.7*   Liver Function Tests: No results found for this basename: AST, ALT, ALKPHOS, BILITOT, PROT, ALBUMIN,  in the last 168 hours No results found for this basename: LIPASE, AMYLASE,  in the last 168 hours No results found for this basename: AMMONIA,  in the last 168 hours CBC:  Recent Labs Lab 01/17/14 1741 01/18/14 0945 01/19/14 0243  WBC 7.3 5.6 5.4  NEUTROABS 3.8  --   --   HGB 8.9* 7.9* 7.9*  HCT 27.5* 25.0* 25.8*  MCV 85.1 87.4 88.1  PLT 225 159 164   Cardiac Enzymes: No results found for this basename: CKTOTAL, CKMB, CKMBINDEX, TROPONINI,  in the last 168 hours BNP (last 3 results) No results found for this basename: PROBNP,  in the last 8760 hours CBG: No results found for this basename: GLUCAP,  in the last 168 hours  No results found for this or any previous visit (from the past 240 hour(s)).   Studies: Ct Angio Chest Aortic Dissect W &/or W/o  01/19/2014   CLINICAL DATA:  Left foot embolic disease, evaluate aorta for potential source lesion.  EXAM: CT ANGIOGRAPHY CHEST, ABDOMEN AND PELVIS  TECHNIQUE: Multidetector CT imaging through the chest, abdomen and pelvis was performed using the standard protocol during bolus administration of intravenous contrast.  Multiplanar reconstructed images and MIPs were obtained and reviewed to evaluate the vascular anatomy.  CONTRAST:  24mL OMNIPAQUE IOHEXOL 350 MG/ML SOLN  COMPARISON:  CTA chest, abdomen and pelvis performed 01/07/2014 at [facility].  FINDINGS: CTA CHEST FINDINGS  Mediastinum: Unremarkable CT appearance of the thyroid gland. No suspicious mediastinal or hilar adenopathy. No soft tissue mediastinal mass. The thoracic esophagus is unremarkable.  Heart/Vascular: Conventional 3 vessel aortic arch. No aneurysmal dilatation, acute dissection, irregular plaque or wall adherent mural  thrombus. Mild atherosclerotic calcification noted at the right coronary artery origin. The heart is within normal limits for size. No pericardial effusion. The left atrial appendage is well opacified. No evidence of thrombus. No atrial or ventricular thrombus identified.  Lungs/Pleura: Mild centrilobular emphysema. No suspicious pulmonary nodules. Mild biapical pleural parenchymal scarring. Trace dependent atelectasis and small bilateral effusions. The lungs are otherwise clear.  Bones/Soft Tissues: No acute fracture or aggressive appearing lytic or blastic osseous lesion.  Review of the MIP images confirms the above findings.  CTA ABDOMEN AND PELVIS FINDINGS  VASCULAR  Aorta: Scattered heterogeneous but smooth atherosclerotic plaque throughout the normal caliber abdominal aorta. No evidence of aneurysmal dilatation, plaque ulceration or wall adherent mural thrombus. The distal aorta is relatively small in caliber.  Celiac: Widely patent.  Conventional hepatic arterial anatomy.  SMA: Widely patent.  Renals: Single dominant renal arteries bilaterally. Mild atherosclerotic plaque results in mild narrowing of both arteries. The the left renal artery is slightly atretic.  IMA: Non calcified atherosclerotic plaque results and a moderate to high-grade stenosis at the origin of the inferior mesenteric artery.  Inflow: Heterogeneous atherosclerotic plaque. There are moderate bilateral common iliac artery stenoses. On the left, just before the common iliac bifurcation there is a small focal partially calcified and non calcified atherosclerotic plaque. Correlation with the recent prior CTA study reveals that there was a larger ulcerative plaque with some adherent mural thrombus at this location which is no longer evident. This is almost certainly the source of the patient's embolic disease. The bilateral internal iliac arteries are patent.  Proximal Outflow: Mild heterogeneous plaque along the posterior wall of the bilateral  common femoral arteries without significant stenosis. The proximal visualize superficial and profunda femoral arteries are patent and relatively disease free.  Veins: No focal venous abnormality given the limitations of non venous phase timing.  NON-VASCULAR  Abdomen: Unremarkable CT appearance of the stomach, duodenum, and pancreas. Abnormal mottled appearance of the spleen. Some of the mottling is similar to that seen on the prior study, however there is accentuation extending nearly to the cortex in the anterior and lower pole. This could simply represent normal splenic arterial profusion pattern, however the possibility of small splenic infarcts is not excluded entirely. There are several punctate calcifications which are unchanged compared to prior and likely reflecting the sequelae of prior granulomatous disease.  No suspicious hepatic lesion. Normal hepatic morphology and contour. There are 2 small calcified granulomas. Gallbladder is unremarkable. No intra or extrahepatic biliary ductal dilatation.  Stable appearance of the kidneys. The left kidney demonstrates diffuse renal cortical thinning, hydronephrosis and extensive urothelial thickening. A double-J ureteral stent is present in in unchanged position. There is nonspecific left greater than right perinephric stranding. On the right, loculated air are again noted in the upper pole collecting system. Stable right upper pole renal cortical cyst.  Partial right hemicolectomy with patent ileocolonic anastomosis in the right hemi abdomen. No definite free fluid or suspicious adenopathy.  Pelvis: Left double-J ureteral  stents in the bladder along with a Foley catheter. Air is also noted in the bladder lumen. The bladder wall is slightly thickened and hyper enhancing suggesting cystitis. Surgical clips noted along the right pelvic sidewall. Surgical changes of prior hysterectomy.  Bones/Soft Tissues: No acute fracture or aggressive appearing lytic or blastic  osseous lesion. Subacute to remote fracture of the lateral left tenth rib.  Review of the MIP images confirms the above findings.  IMPRESSION: CTA CHEST  1. No acute vascular abnormality or potential source lesion to explain the patient's left lower extremity embolic event. 2. Mild atherosclerotic plaque including coronary artery disease. 3. Mild centrilobular pulmonary emphysema. 4. Interval development of trace bilateral pleural effusions and mild dependent lower lobe atelectasis. Otherwise, the lungs are clear.  CTA ABD/PELVIS  1. Comparison of today's CTA compared to the recent CTA chest/abdomen/pelvis performed at an outside institution on 01/07/2014 and demonstrates an acute difference in the appearance of a heterogeneous irregular plaque in the distal left common iliac artery just proximal to the bifurcation. On the prior study, there was an irregular plaque with adherent mural thrombus resulting in at least 75% narrowing of the vessel lumen. On the present study, there is only a relatively smooth residual plaque resulting in minimal stenosis. This almost certainly represents the source of the patient's left lower extremity embolic disease. 2. Somewhat irregular mottled appearance of the spleen appear significantly more prominent than seen on the recent prior CTA. While this is strongly favored to simply represent differences in arterial phase timing and splenic parenchymal perfusion, the possibility of lower pole splenic infarcts is not excluded entirely. Recommend clinical correlation for for history of acute left upper quadrant pain. 3. Scattered atherosclerotic vascular disease as detailed above without evidence of acute dissection, aneurysm, ulcerative plaque or wall adherent thrombus. 4. Moderate stenoses in mid segments of the bilateral common iliac arteries. 5. Moderate to high-grade focal stenosis of the origin of the inferior mesenteric artery. 6. Stable position of a left-sided double-J ureteral  stent with persistent left hydronephrosis and diffuse urothelial thickening and hyper enhancement extending from the renal collecting system to the bladder. Findings raise concern for acute, or chronic infection/inflammation. 7. Additional ancillary findings as above without significant interval change compared to recent prior imaging. Signed,  Criselda Peaches, MD  [facility]  [facility]   Electronically Signed   By: Jacqulynn Cadet M.D.   On: 01/19/2014 11:35   Ct Angio Abd/pel W/ And/or W/o  01/19/2014   CLINICAL DATA:  Left foot embolic disease, evaluate aorta for potential source lesion.  EXAM: CT ANGIOGRAPHY CHEST, ABDOMEN AND PELVIS  TECHNIQUE: Multidetector CT imaging through the chest, abdomen and pelvis was performed using the standard protocol during bolus administration of intravenous contrast. Multiplanar reconstructed images and MIPs were obtained and reviewed to evaluate the vascular anatomy.  CONTRAST:  15mL OMNIPAQUE IOHEXOL 350 MG/ML SOLN  COMPARISON:  CTA chest, abdomen and pelvis performed 01/07/2014 at [facility].  FINDINGS: CTA CHEST FINDINGS  Mediastinum: Unremarkable CT appearance of the thyroid gland. No suspicious mediastinal or hilar adenopathy. No soft tissue mediastinal mass. The thoracic esophagus is unremarkable.  Heart/Vascular: Conventional 3 vessel aortic arch. No aneurysmal dilatation, acute dissection, irregular plaque or wall adherent mural thrombus. Mild atherosclerotic calcification noted at the right coronary artery origin. The heart is within normal limits for size. No pericardial effusion. The left atrial appendage is well opacified. No evidence of thrombus. No atrial or ventricular thrombus identified.  Lungs/Pleura: Mild centrilobular emphysema. No suspicious pulmonary nodules. Mild biapical pleural parenchymal scarring. Trace dependent atelectasis and small bilateral effusions. The lungs are otherwise clear.   Bones/Soft Tissues: No acute fracture or aggressive appearing lytic or blastic osseous lesion.  Review of the MIP images confirms the above findings.  CTA ABDOMEN AND PELVIS FINDINGS  VASCULAR  Aorta: Scattered heterogeneous but smooth atherosclerotic plaque throughout the normal caliber abdominal aorta. No evidence of aneurysmal dilatation, plaque ulceration or wall adherent mural thrombus. The distal aorta is relatively small in caliber.  Celiac: Widely patent.  Conventional hepatic arterial anatomy.  SMA: Widely patent.  Renals: Single dominant renal arteries bilaterally. Mild atherosclerotic plaque results in mild narrowing of both arteries. The the left renal artery is slightly atretic.  IMA: Non calcified atherosclerotic plaque results and a moderate to high-grade stenosis at the origin of the inferior mesenteric artery.  Inflow: Heterogeneous atherosclerotic plaque. There are moderate bilateral common iliac artery stenoses. On the left, just before the common iliac bifurcation there is a small focal partially calcified and non calcified atherosclerotic plaque. Correlation with the recent prior CTA study reveals that there was a larger ulcerative plaque with some adherent mural thrombus at this location which is no longer evident. This is almost certainly the source of the patient's embolic disease. The bilateral internal iliac arteries are patent.  Proximal Outflow: Mild heterogeneous plaque along the posterior wall of the bilateral common femoral arteries without significant stenosis. The proximal visualize superficial and profunda femoral arteries are patent and relatively disease free.  Veins: No focal venous abnormality given the limitations of non venous phase timing.  NON-VASCULAR  Abdomen: Unremarkable CT appearance of the stomach, duodenum, and pancreas. Abnormal mottled appearance of the spleen. Some of the mottling is similar to that seen on the prior study, however there is accentuation extending  nearly to the cortex in the anterior and lower pole. This could simply represent normal splenic arterial profusion pattern, however the possibility of small splenic infarcts is not excluded entirely. There are several punctate calcifications which are unchanged compared to prior and likely reflecting the sequelae of prior granulomatous disease.  No suspicious hepatic lesion. Normal hepatic morphology and contour. There are 2 small calcified granulomas. Gallbladder is unremarkable. No intra or extrahepatic biliary ductal dilatation.  Stable appearance of the kidneys. The left kidney demonstrates diffuse renal cortical thinning, hydronephrosis and extensive urothelial thickening. A double-J ureteral stent is present in in unchanged position. There is nonspecific left greater than right perinephric stranding. On the right, loculated air are again noted in the upper pole collecting system. Stable right upper pole renal cortical cyst.  Partial right hemicolectomy with patent ileocolonic anastomosis in the right hemi abdomen. No definite free fluid or suspicious adenopathy.  Pelvis: Left double-J ureteral stents in the bladder along with a Foley catheter. Air is also noted in the bladder lumen. The bladder wall is slightly thickened and hyper enhancing suggesting cystitis. Surgical clips noted along the right pelvic sidewall. Surgical changes of prior hysterectomy.  Bones/Soft Tissues: No acute fracture or aggressive appearing lytic or blastic osseous lesion. Subacute to remote fracture of the lateral left tenth rib.  Review of the MIP images confirms the above findings.  IMPRESSION: CTA CHEST  1. No acute vascular abnormality or potential source lesion to explain the patient's left lower extremity embolic event. 2. Mild atherosclerotic plaque including coronary artery disease. 3. Mild centrilobular pulmonary emphysema. 4. Interval development of trace bilateral pleural effusions and mild dependent lower lobe  atelectasis.  Otherwise, the lungs are clear.  CTA ABD/PELVIS  1. Comparison of today's CTA compared to the recent CTA chest/abdomen/pelvis performed at an outside institution on 01/07/2014 and demonstrates an acute difference in the appearance of a heterogeneous irregular plaque in the distal left common iliac artery just proximal to the bifurcation. On the prior study, there was an irregular plaque with adherent mural thrombus resulting in at least 75% narrowing of the vessel lumen. On the present study, there is only a relatively smooth residual plaque resulting in minimal stenosis. This almost certainly represents the source of the patient's left lower extremity embolic disease. 2. Somewhat irregular mottled appearance of the spleen appear significantly more prominent than seen on the recent prior CTA. While this is strongly favored to simply represent differences in arterial phase timing and splenic parenchymal perfusion, the possibility of lower pole splenic infarcts is not excluded entirely. Recommend clinical correlation for for history of acute left upper quadrant pain. 3. Scattered atherosclerotic vascular disease as detailed above without evidence of acute dissection, aneurysm, ulcerative plaque or wall adherent thrombus. 4. Moderate stenoses in mid segments of the bilateral common iliac arteries. 5. Moderate to high-grade focal stenosis of the origin of the inferior mesenteric artery. 6. Stable position of a left-sided double-J ureteral stent with persistent left hydronephrosis and diffuse urothelial thickening and hyper enhancement extending from the renal collecting system to the bladder. Findings raise concern for acute, or chronic infection/inflammation. 7. Additional ancillary findings as above without significant interval change compared to recent prior imaging. Signed,  Criselda Peaches, MD  [facility]  [facility]   Electronically Signed   By: Jacqulynn Cadet  M.D.   On: 01/19/2014 11:35    Scheduled Meds: . amLODipine  5 mg Oral Daily  . clopidogrel  75 mg Oral Q breakfast  . donepezil  10 mg Oral QHS  . linezolid  600 mg Oral Q12H  . magnesium oxide  400 mg Oral Daily  . metoprolol tartrate  25 mg Oral BID  . phenytoin  200 mg Oral QHS  . QUEtiapine  25 mg Oral QPM  . sodium chloride  3 mL Intravenous Q12H   Continuous Infusions: . sodium chloride 75 mL/hr at 01/17/14 2151  . heparin 850 Units/hr (01/19/14 1051)    Time spent: > 35 minutes    Velvet Bathe  [facility] Pager [phone] If 7PM-7AM, please contact night-coverage at [email or web address], password [facility] 01/19/2014, 5:42 PM  LOS: 2 days

## 2014-01-19 NOTE — Progress Notes (Signed)
ANTICOAGULATION CONSULT NOTE - Follow Up Consult  Pharmacy Consult for heparin Indication: DVT  Allergies  Allergen Reactions  . Codeine     REACTION: intolerance    Patient Measurements: Height: 5\' 2"  (157.5 cm) Weight: 81 lb (36.74 kg) IBW/kg (Calculated) : 50.1  Vital Signs: Temp: 98.2 F (36.8 C) (02/10 0505) Temp src: Oral (02/10 0505) BP: 137/59 mmHg (02/10 0807) Pulse Rate: 70 (02/10 0807)  Labs:  Recent Labs  01/17/14 1741 01/17/14 2126 01/18/14 0945 01/18/14 1909 01/19/14 0243  HGB 8.9*  --  7.9*  --  7.9*  HCT 27.5*  --  25.0*  --  25.8*  PLT 225  --  159  --  164  APTT  --  31  --   --   --   LABPROT  --  13.1  --   --   --   INR  --  1.01  --   --   --   HEPARINUNFRC  --   --  0.27* 0.27* 0.35  CREATININE 1.55*  --  1.25*  --  1.43*    Estimated Creatinine Clearance: 23.6 ml/min (by C-G formula based on Cr of 1.43).   Medications:  Scheduled:  . amLODipine  5 mg Oral Daily  . clopidogrel  75 mg Oral Q breakfast  . donepezil  10 mg Oral QHS  . linezolid  600 mg Oral Q12H  . magnesium oxide  400 mg Oral Daily  . metoprolol tartrate  25 mg Oral BID  . phenytoin  200 mg Oral QHS  . QUEtiapine  25 mg Oral QPM  . sodium chloride  3 mL Intravenous Q12H   Infusions:  . sodium chloride 75 mL/hr at 01/17/14 2151  . heparin 800 Units/hr (01/18/14 2056)    Assessment: 5 YOF on IV heparin for left foot thromboembolic event with possible chronic PVD.  Heparin level low end therapeutic range = 0.35 on 800 units/hr. CBC stable, No bleeding noted per chart.   Goal of Therapy:  Heparin level 0.3-0.7 units/ml Monitor platelets by anticoagulation protocol: Yes   Plan:   -Increase heparin slightly to 850 units/hr to stay in therapeutic range -f/u daily heparin level and CBC -f/u plans for oral anticoagulation  Maryanna Shape, PharmD, BCPS  Clinical Pharmacist  Pager: [phone]   01/19/2014 9:25 AM

## 2014-01-19 NOTE — Progress Notes (Signed)
Pt's husband would like to be contacted on mobile phone [phone]. He has questions concerning procedure.

## 2014-01-19 NOTE — Progress Notes (Addendum)
Daily Progress Note  Assessment/Planning: Thromboembolism to L foot    Non-invasive studies are consistent with thromboembolic activity to left foot NOT in-situ thrombosis  CTA Chest/abd/pelvis to evaluate lumen: if study fails to demonstrate etiology of emboli, Bilateral leg angiogram might be needed  Echo done: pending read  Subjective   Pain controlled  Objective Filed Vitals:   01/18/14 0113 01/18/14 0447 01/18/14 2042 01/19/14 0505  BP: 147/57 127/82 103/55 132/55  Pulse: 52 54 56 65  Temp: 97.9 F (36.6 C) 97.8 F (36.6 C) 97.9 F (36.6 C) 98.2 F (36.8 C)  TempSrc: Oral Oral Oral Oral  Resp: 20 20 18 18   Height: 5\' 2"  (1.575 m)     Weight: 81 lb (36.74 kg)     SpO2: 99% 99% 98% 99%    Intake/Output Summary (Last 24 hours) at 01/19/14 0713 Last data filed at 01/19/14 0600  Gross per 24 hour  Intake   1432 ml  Output    500 ml  Net    932 ml    PULM  CTAB CV  RRR GI  soft, NTND VASC  L foot toes 1,4,5 ischemia unchanged except for some improvement in toe 1  Laboratory CBC    Component Value Date/Time   WBC 5.4 01/19/2014 0243   HGB 7.9* 01/19/2014 0243   HCT 25.8* 01/19/2014 0243   PLT 164 01/19/2014 0243    BMET    Component Value Date/Time   NA 144 01/19/2014 0243   K 4.7 01/19/2014 0243   CL 114* 01/19/2014 0243   CO2 20 01/19/2014 0243   GLUCOSE 83 01/19/2014 0243   BUN 12 01/19/2014 0243   CREATININE 1.43* 01/19/2014 0243   CALCIUM 7.7* 01/19/2014 0243   GFRNONAA 38* 01/19/2014 0243   GFRAA 44* 01/19/2014 0243    [name], MD [facility] [place] Office: [phone] Pager: [phone]  01/19/2014, 7:13 AM  Addendum  I have reviewed the CTA chest/abd/pelvis.  Except for some distal aortic and bilateral common iliac artery atherosclerosis, there is limited disease.  I doubt this accounts for this patient's left foot embolic findings.  Echocardiogram does not appear to demonstrate any intra-cardiac thrombus.  Last  diagnostic test would be a bilateral leg angiogram, which will be scheduled tomorrow.  Hold Heparin at 6 am.  I discussed with the patient the nature of angiographic procedures, especially the limited patencies of any endovascular intervention.  The patient is aware of that the risks of an angiographic procedure include but are not limited to: bleeding, infection, access site complications, renal failure, embolization, rupture of vessel, dissection, possible need for emergent surgical intervention, possible need for surgical procedures to treat the patient's pathology, anaphylactic reaction to contrast, and stroke and death.  The patient is aware of the risks and agrees to proceed.  [name], MD [facility] [place] Office: [phone] Pager: [phone]  01/19/2014, 11:20 AM  Addendum  I received a call from Dr. Laurence Ferrari with Radiology.  From comparison from outside facility imaging, there is interval change in the left distal common iliac artery which likely accounts for the embolic phenomena to left foot.  I recommend continuing with bilateral runoff to establish the runoff status of both legs.  If the left distal common iliac artery appears diseased tomorrow, covering the distal common iliac artery with a covered stent might be necessary.  [name], MD [facility] [employer] Office: [phone] Pager: [phone]  01/19/2014, 11:46 AM  Addendum  I have attempted to contact the patient's husband to discuss the status of her work-up.  No one is answering and no answering machine is available to leave a message.  [name], MD [facility] [place] Office: [phone] Pager: [phone]  01/19/2014, 11:51 AM

## 2014-01-20 ENCOUNTER — Encounter (HOSPITAL_COMMUNITY)
Admission: EM | Disposition: A | Discharge status: Home Health | Payer: Self-pay | Source: Home / Self Care | Attending: Internal Medicine

## 2014-01-20 DIAGNOSIS — E43 Unspecified severe protein-calorie malnutrition: Secondary | ICD-10-CM

## 2014-01-20 DIAGNOSIS — N390 Urinary tract infection, site not specified: Secondary | ICD-10-CM

## 2014-01-20 DIAGNOSIS — B952 Enterococcus as the cause of diseases classified elsewhere: Secondary | ICD-10-CM

## 2014-01-20 DIAGNOSIS — G309 Alzheimer's disease, unspecified: Secondary | ICD-10-CM

## 2014-01-20 DIAGNOSIS — F0280 Dementia in other diseases classified elsewhere without behavioral disturbance: Secondary | ICD-10-CM

## 2014-01-20 DIAGNOSIS — D62 Acute posthemorrhagic anemia: Secondary | ICD-10-CM

## 2014-01-20 HISTORY — PX: ABDOMINAL ANGIOGRAM: SHX5499

## 2014-01-20 LAB — BASIC METABOLIC PANEL
BUN: 8 mg/dL (ref 6–23)
CALCIUM: 8.1 mg/dL — AB (ref 8.4–10.5)
CHLORIDE: 113 meq/L — AB (ref 96–112)
CO2: 18 mEq/L — ABNORMAL LOW (ref 19–32)
CREATININE: 1.14 mg/dL — AB (ref 0.50–1.10)
GFR calc non Af Amer: 50 mL/min — ABNORMAL LOW (ref >90)
GFR, EST AFRICAN AMERICAN: 58 mL/min — AB (ref >90)
Glucose, Bld: 79 mg/dL (ref 70–99)
Potassium: 4.7 mEq/L (ref 3.7–5.3)
Sodium: 143 mEq/L (ref 137–147)

## 2014-01-20 LAB — ABO/RH: ABO/RH(D): A POS

## 2014-01-20 LAB — CBC
HEMATOCRIT: 28.9 % — AB (ref 36.0–46.0)
HEMATOCRIT: 20.4 % — AB (ref 36.0–46.0)
HEMOGLOBIN: 6.4 g/dL — AB (ref 12.0–15.0)
Hemoglobin: 9.1 g/dL — ABNORMAL LOW (ref 12.0–15.0)
MCH: 27.6 pg (ref 26.0–34.0)
MCH: 27.6 pg (ref 26.0–34.0)
MCHC: 31.5 g/dL (ref 30.0–36.0)
MCHC: 31.4 g/dL (ref 30.0–36.0)
MCV: 87.6 fL (ref 78.0–100.0)
MCV: 87.9 fL (ref 78.0–100.0)
PLATELETS: 195 10*3/uL (ref 150–400)
Platelets: 229 10*3/uL (ref 150–400)
RBC: 3.3 MIL/uL — ABNORMAL LOW (ref 3.87–5.11)
RBC: 2.32 MIL/uL — ABNORMAL LOW (ref 3.87–5.11)
RDW: 16.7 % — AB (ref 11.5–15.5)
RDW: 17 % — ABNORMAL HIGH (ref 11.5–15.5)
WBC: 5.6 10*3/uL (ref 4.0–10.5)
WBC: 8.3 10*3/uL (ref 4.0–10.5)

## 2014-01-20 LAB — POCT I-STAT, CHEM 8
BUN: 7 mg/dL (ref 6–23)
CHLORIDE: 113 meq/L — AB (ref 96–112)
Calcium, Ion: 1.24 mmol/L (ref 1.13–1.30)
Creatinine, Ser: 1.1 mg/dL (ref 0.50–1.10)
GLUCOSE: 91 mg/dL (ref 70–99)
HCT: 20 % — ABNORMAL LOW (ref 36.0–46.0)
HEMOGLOBIN: 6.8 g/dL — AB (ref 12.0–15.0)
POTASSIUM: 4.3 meq/L (ref 3.7–5.3)
Sodium: 143 mEq/L (ref 137–147)
TCO2: 17 mmol/L (ref 0–100)

## 2014-01-20 LAB — URINALYSIS, ROUTINE W REFLEX MICROSCOPIC
BILIRUBIN URINE: NEGATIVE
Glucose, UA: NEGATIVE mg/dL
KETONES UR: NEGATIVE mg/dL
NITRITE: NEGATIVE
Protein, ur: 30 mg/dL — AB
Specific Gravity, Urine: 1.023 (ref 1.005–1.030)
UROBILINOGEN UA: 0.2 mg/dL (ref 0.0–1.0)
pH: 6.5 (ref 5.0–8.0)

## 2014-01-20 LAB — URINE MICROSCOPIC-ADD ON

## 2014-01-20 LAB — PREPARE RBC (CROSSMATCH)

## 2014-01-20 LAB — MRSA PCR SCREENING: MRSA by PCR: NEGATIVE

## 2014-01-20 LAB — HEPARIN LEVEL (UNFRACTIONATED): HEPARIN UNFRACTIONATED: 0.32 [IU]/mL (ref 0.30–0.70)

## 2014-01-20 SURGERY — ABDOMINAL ANGIOGRAM
Anesthesia: LOCAL

## 2014-01-20 MED ORDER — HALOPERIDOL LACTATE 5 MG/ML IJ SOLN
1.0000 mg | Freq: Four times a day (QID) | INTRAMUSCULAR | Status: DC | PRN
  Administered 2014-01-23 – 2014-01-26 (×3): 2 mg via INTRAVENOUS
  Filled 2014-01-20 (×4): qty 1

## 2014-01-20 MED ORDER — QUETIAPINE FUMARATE 25 MG PO TABS
25.0000 mg | ORAL_TABLET | Freq: Every evening | ORAL | Status: DC | PRN
  Administered 2014-01-20 – 2014-01-21 (×2): 25 mg via ORAL
  Filled 2014-01-20 (×6): qty 1

## 2014-01-20 MED ORDER — HYDROMORPHONE HCL PF 1 MG/ML IJ SOLN
1.0000 mg | INTRAMUSCULAR | Status: DC | PRN
  Administered 2014-01-20: 2 mg via INTRAVENOUS
  Administered 2014-01-21 (×2): 1 mg via INTRAVENOUS
  Administered 2014-01-21: 2 mg via INTRAVENOUS
  Administered 2014-01-21 (×3): 1 mg via INTRAVENOUS
  Administered 2014-01-22 – 2014-01-23 (×7): 2 mg via INTRAVENOUS
  Administered 2014-01-23 (×2): 1 mg via INTRAVENOUS
  Administered 2014-01-23 – 2014-01-24 (×2): 2 mg via INTRAVENOUS
  Administered 2014-01-24: 1 mg via INTRAVENOUS
  Administered 2014-01-24 – 2014-01-25 (×5): 2 mg via INTRAVENOUS
  Administered 2014-01-25: 1 mg via INTRAVENOUS
  Filled 2014-01-20: qty 2
  Filled 2014-01-20: qty 1
  Filled 2014-01-20 (×2): qty 2
  Filled 2014-01-20 (×2): qty 1
  Filled 2014-01-20 (×4): qty 2
  Filled 2014-01-20: qty 1
  Filled 2014-01-20: qty 2
  Filled 2014-01-20 (×2): qty 1
  Filled 2014-01-20 (×2): qty 2
  Filled 2014-01-20: qty 1
  Filled 2014-01-20 (×2): qty 2
  Filled 2014-01-20: qty 1
  Filled 2014-01-20 (×4): qty 2
  Filled 2014-01-20: qty 1

## 2014-01-20 MED ORDER — METOPROLOL TARTRATE 1 MG/ML IV SOLN: 2.0000 mg | INTRAVENOUS | Status: DC | PRN

## 2014-01-20 MED ORDER — ONDANSETRON HCL 4 MG/2ML IJ SOLN
4.0000 mg | Freq: Four times a day (QID) | INTRAMUSCULAR | Status: DC | PRN
  Administered 2014-01-20 – 2014-01-25 (×2): 4 mg via INTRAVENOUS
  Filled 2014-01-20 (×2): qty 2

## 2014-01-20 MED ORDER — DOCUSATE SODIUM 100 MG PO CAPS
100.0000 mg | ORAL_CAPSULE | Freq: Every day | ORAL | Status: DC
  Administered 2014-01-21 – 2014-01-26 (×6): 100 mg via ORAL
  Filled 2014-01-20 (×7): qty 1

## 2014-01-20 MED ORDER — ACETAMINOPHEN 325 MG PO TABS
325.0000 mg | ORAL_TABLET | ORAL | Status: DC | PRN
  Administered 2014-01-23 – 2014-01-27 (×7): 650 mg via ORAL
  Filled 2014-01-20 (×7): qty 2

## 2014-01-20 MED ORDER — ACETAMINOPHEN 650 MG RE SUPP: 325.0000 mg | RECTAL | Status: DC | PRN

## 2014-01-20 MED ORDER — LABETALOL HCL 5 MG/ML IV SOLN
10.0000 mg | INTRAVENOUS | Status: DC | PRN
  Filled 2014-01-20: qty 4

## 2014-01-20 MED ORDER — HEPARIN (PORCINE) IN NACL 2-0.9 UNIT/ML-% IJ SOLN
INTRAMUSCULAR | Status: AC
  Filled 2014-01-20: qty 1000

## 2014-01-20 MED ORDER — HYDROMORPHONE HCL 2 MG PO TABS
1.0000 mg | ORAL_TABLET | ORAL | Status: DC | PRN
  Administered 2014-01-21 – 2014-01-22 (×2): 2 mg via ORAL
  Filled 2014-01-20 (×3): qty 1

## 2014-01-20 MED ORDER — SODIUM CHLORIDE 0.9 % IV SOLN
500.0000 mL | Freq: Once | INTRAVENOUS | Status: AC | PRN
Start: 2014-01-20 — End: 2014-01-20

## 2014-01-20 MED ORDER — FENTANYL CITRATE 0.05 MG/ML IJ SOLN
INTRAMUSCULAR | Status: AC
  Filled 2014-01-20: qty 2

## 2014-01-20 MED ORDER — HYDROMORPHONE HCL PF 1 MG/ML IJ SOLN
INTRAMUSCULAR | Status: AC
  Filled 2014-01-20: qty 1

## 2014-01-20 MED ORDER — SODIUM CHLORIDE 0.45 % IV SOLN
INTRAVENOUS | Status: DC
  Administered 2014-01-20 – 2014-01-27 (×3): via INTRAVENOUS

## 2014-01-20 MED ORDER — SACCHAROMYCES BOULARDII 250 MG PO CAPS
250.0000 mg | ORAL_CAPSULE | Freq: Two times a day (BID) | ORAL | Status: DC
  Administered 2014-01-21 – 2014-01-27 (×13): 250 mg via ORAL
  Filled 2014-01-20 (×15): qty 1

## 2014-01-20 MED ORDER — HYDRALAZINE HCL 20 MG/ML IJ SOLN
10.0000 mg | INTRAMUSCULAR | Status: DC | PRN
  Administered 2014-01-23: 10 mg via INTRAVENOUS
  Filled 2014-01-20: qty 1

## 2014-01-20 MED ORDER — LIDOCAINE HCL (PF) 1 % IJ SOLN
INTRAMUSCULAR | Status: AC
  Filled 2014-01-20: qty 30

## 2014-01-20 MED ORDER — HYDROMORPHONE HCL PF 1 MG/ML IJ SOLN
0.5000 mg | INTRAMUSCULAR | Status: DC | PRN
  Filled 2014-01-20: qty 1

## 2014-01-20 MED ORDER — MIDAZOLAM HCL 2 MG/2ML IJ SOLN
INTRAMUSCULAR | Status: AC
  Filled 2014-01-20: qty 2

## 2014-01-20 MED ORDER — HYDRALAZINE HCL 20 MG/ML IJ SOLN
10.0000 mg | INTRAMUSCULAR | Status: DC | PRN
  Filled 2014-01-20: qty 1

## 2014-01-20 NOTE — Progress Notes (Signed)
Patient stated she was in a lot of pain. She said a 9/10. Her blood pressure was 163/76. Patient was given dilaudid and blood pressure was rechecked to be 170/67. MD notified. Will continue to monitor.   Domingo Dimes RN

## 2014-01-20 NOTE — H&P (View-Only) (Signed)
Daily Progress Note  Assessment/Planning: Thromboembolism to L foot    Non-invasive studies are consistent with thromboembolic activity to left foot NOT in-situ thrombosis  CTA Chest/abd/pelvis to evaluate lumen: if study fails to demonstrate etiology of emboli, Bilateral leg angiogram might be needed  Echo done: pending read  Subjective   Pain controlled  Objective Filed Vitals:   01/18/14 0113 01/18/14 0447 01/18/14 2042 01/19/14 0505  BP: 147/57 127/82 103/55 132/55  Pulse: 52 54 56 65  Temp: 97.9 F (36.6 C) 97.8 F (36.6 C) 97.9 F (36.6 C) 98.2 F (36.8 C)  TempSrc: Oral Oral Oral Oral  Resp: 20 20 18 18   Height: 5\' 2"  (1.575 m)     Weight: 81 lb (36.74 kg)     SpO2: 99% 99% 98% 99%    Intake/Output Summary (Last 24 hours) at 01/19/14 0713 Last data filed at 01/19/14 0600  Gross per 24 hour  Intake   1432 ml  Output    500 ml  Net    932 ml    PULM  CTAB CV  RRR GI  soft, NTND VASC  L foot toes 1,4,5 ischemia unchanged except for some improvement in toe 1  Laboratory CBC    Component Value Date/Time   WBC 5.4 01/19/2014 0243   HGB 7.9* 01/19/2014 0243   HCT 25.8* 01/19/2014 0243   PLT 164 01/19/2014 0243    BMET    Component Value Date/Time   NA 144 01/19/2014 0243   K 4.7 01/19/2014 0243   CL 114* 01/19/2014 0243   CO2 20 01/19/2014 0243   GLUCOSE 83 01/19/2014 0243   BUN 12 01/19/2014 0243   CREATININE 1.43* 01/19/2014 0243   CALCIUM 7.7* 01/19/2014 0243   GFRNONAA 38* 01/19/2014 0243   GFRAA 44* 01/19/2014 0243    [name], MD [facility] Office: [phone] Pager: [phone]  01/19/2014, 7:13 AM  Addendum  I have reviewed the CTA chest/abd/pelvis.  Except for some distal aortic and bilateral common iliac artery atherosclerosis, there is limited disease.  I doubt this accounts for this patient's left foot embolic findings.  Echocardiogram does not appear to demonstrate any intra-cardiac thrombus.  Last  diagnostic test would be a bilateral leg angiogram, which will be scheduled tomorrow.  Hold Heparin at 6 am.  I discussed with the patient the nature of angiographic procedures, especially the limited patencies of any endovascular intervention.  The patient is aware of that the risks of an angiographic procedure include but are not limited to: bleeding, infection, access site complications, renal failure, embolization, rupture of vessel, dissection, possible need for emergent surgical intervention, possible need for surgical procedures to treat the patient's pathology, anaphylactic reaction to contrast, and stroke and death.  The patient is aware of the risks and agrees to proceed.  [name], MD [facility] Office: [phone] Pager: [phone]  01/19/2014, 11:20 AM  Addendum  I received a call from Dr. Laurence Ferrari with Radiology.  From comparison from outside facility imaging, there is interval change in the left distal common iliac artery which likely accounts for the embolic phenomena to left foot.  I recommend continuing with bilateral runoff to establish the runoff status of both legs.  If the left distal common iliac artery appears diseased tomorrow, covering the distal common iliac artery with a covered stent might be necessary.  [name], MD [facility] [place] Office: [phone] Pager: [phone]  01/19/2014, 11:46 AM  Addendum  I have attempted to contact the patient's husband to discuss the status of her work-up.  No one is answering and no answering machine is available to leave a message.  [name], MD [facility] Office: [phone] Pager: [phone]  01/19/2014, 11:51 AM

## 2014-01-20 NOTE — Op Note (Signed)
Pt with borderline BP low 100s and tachycardia post sheath removal.  Difficult to know if this is related to agitation/vagal.  No obvious groin hematoma.  Hold all anticoagulation heparin plavix etc for now.  Will bolus with saline and transfuse 2 U RBC since pre procedure Hgb was less than 8.  Ruta Hinds, MD [facility] Office: [phone] Pager: [phone]

## 2014-01-20 NOTE — ED Provider Notes (Signed)
Medical screening examination/treatment/procedure(s) were performed by non-physician practitioner and as supervising physician I was immediately available for consultation/collaboration.     Leota Jacobsen, MD 01/20/14 1106

## 2014-01-20 NOTE — Op Note (Addendum)
Procedure: Aortogram with bilateral lower extremity runoff  Preoperative diagnosis: Ischemia left foot  Postoperative diagnosis: Same  Anesthesia: Local with IV sedation  Operative findings: #1 irregular surface left mid common iliac with no flow-limiting stenosis #2 three-vessel runoff in continuity bilaterally  Operative details: After obtaining informed consent, the patient was taken to the [facility] lab. The patient was placed in supine position the Angio table. Both groins were prepped and draped in usual sterile fashion. Local anesthesia was entered over the left common femoral artery. An introducer needle was used to cannulate the left common femoral artery. There was good return of blood flow. However the guidewire would not advance easily. The patient is demented and was sitting up almost off the table during the procedure. The needle was removed and hemostasis obtained with direct pressure. An additional attempt was made to cannulate the left common femoral artery successfully. The guidewire was advanced up in the abdominal aorta under fluoroscopic guidance. A 5 French sheath was placed over the guidewire the left common femoral artery. This was thoroughly flushed with heparinized saline. A 5 French pigtail catheter was placed over the guidewire into the abdominal aorta. Abdominal aortogram was obtained. The abdominal aorta is patent with patent left and right renal arteries. The left and right common external and internal arteries are patent. Oblique views of the pelvis were obtained. The right iliac system is widely patent. The left iliac system is patent although there is an irregular surface with slightly elevated plaque in the mid left common iliac artery. There is also noted in the left groin extravasation of contrast off of a branch of the common femoral artery. This persisted on multiple views. Lower extremity runoff views were then obtained via the pigtail catheter. The left and right common  femoral arteries are patent. The left and right superficial femoral and profunda femoris arteries are patent. The left popliteal and right popliteal arteries are patent. There is three-vessel runoff to the feet bilaterally although the vessels are small.  A 30 RAO oblique view magnified was also obtained to further determine the area of irregularity in the left common iliac. Finally an oblique view of the groin was obtained. There was still extravasation of contrast from this branch artery. It was decided that today because of the contrast extravasation intervention would not be performed on the left common iliac artery. The pigtail catheter was removed over a guidewire. The 5 French sheath was removed and hemostasis obtained with direct pressure. The patient tolerated procedure well and there were no complications. However, the patient was quite agitated during the procedure which made this difficult view to her overall dementia.  Consideration will be given for treatment of the left common iliac lesion with Plavix and aspirin versus consideration of left common iliac stenting. I will discuss the findings further with Dr. Bridgett Larsson.  Will discontinue Plavix and heparin for now due to the contrast extravasation in the left groin.  Ruta Hinds, MD [facility] Office: [phone] Pager: [phone]

## 2014-01-20 NOTE — Progress Notes (Addendum)
[facility] PROGRESS NOTE  [name] [id number] DOB: [birth year] DOA: 01/17/2014 PCP: Teressa Lower, MD  Assessment/Plan  Digital ischemia of 3 toes of the left foot - heparin held this morning in anticipation of angiogram -  Continue oral and IV pain medication - Noninvasive studies consistent with thromboembolic activity to left foot  -  Angiogram:  Aorta, left and right renal arteries, left and right common external and internal arteries, right iliac system patient.  Left iliac patent except fo rirregular surface with plaque of the mid left common iliac artery.  *Contrast extravasation of a branch of the common femoral artery which persisted*  Bilateral common femoral arteries, superficial femoral and profunda emoris arteries, bilateral popliteal, and runoff vessels to legs were all patent, although the latter were small.  No intervention was performed today because of contrast extravasation.    Acute blood loss anemia during procedure today with transient hypotension and tachycardia, likely related to area of bleeding seen during angiogram and blood loss from procedure itself -  IVF -  Transfusing 2 units PRBC -  Heparin and plavix held for now -  Post transfusion H&H -  CBC in AM -  If recurrent hypotension/hemodynamic instability, consider STAT CT and call vascular surgery  Previous VRE UTI present on admission, with increased WBC and RBC on UA.  continue Zyvox through 2/13, then stop.   -  Add on urine culture -  CT demonstrates perinephric stranding  History of dementia/memory loss - Seen by Dr. Brett Fairy from [facility].  -  Continue phenytoin and seroquel -  Continue aricept -  QTc 430 -  Add prn haldol   Acute renal failure resolved with hydration.  -  F/u AM BMP given recent contrast exposure and hypotension  History of C diff - not currently taking fidaxomycin as an outpatient. This was verified by pharmacy, appreciate input. Monitor for diarrhea  -  Start  florastor  Severe protein calorie malnutrition -  Once allowed to eat, start supplements -  Nutrition consultation -  Liberalized diet  Diet:  NPO per vascular surgery Access:  2 PIV, 20G right AC and 22G right forearm IVF:  yes Proph:  off  Code Status: full Family Communication: patient is confused  Disposition Plan: pending hemoglobin stable, pain controlled, definitive treatment of left foot completed   Consultants:  Vascular surgery, Dr. Chen/Dr. Oneida Alar  Procedures:  ABI  LE BL duplex  CT angio chest/abd/pelvis 2/10 Arteriogram 2/11  Antibiotics:  linezolid 2/9 (started prior to 2/9) >>   HPI/Subjective:  Confused, complains of pain of the left foot.  + nausea.    Objective: Filed Vitals:   01/20/14 1600 01/20/14 1615 01/20/14 1630 01/20/14 1700  BP:  104/79 133/53   Pulse:   84   Temp: 98.7 F (37.1 C) 97.7 F (36.5 C)  97.8 F (36.6 C)  TempSrc: Oral Oral  Oral  Resp:   14   Height:      Weight:      SpO2:   100%     Intake/Output Summary (Last 24 hours) at 01/20/14 1803 Last data filed at 01/20/14 1538  Gross per 24 hour  Intake   2620 ml  Output   3080 ml  Net   -460 ml   Filed Weights   01/17/14 2144 01/18/14 0113  Weight: 36.741 kg (81 lb) 36.74 kg (81 lb)    Exam:   General:  Thin CF, No acute distress, restless and confused  HEENT:  NCAT,  MMM  Cardiovascular:  RRR, nl S1, S2 no mrg, 2+ pulses, warm extremities  Respiratory:  CTAB but diminished, no increased WOB  Abdomen:   NABS, soft, NT/ND  MSK:   Normal tone and bulk, 1+ LEE, left foot 1st, 4th, and 5th toes with ischemic changes, palpable pedal pulse  Neuro:  Grossly intact  Psych:  Oriented to person only  Data Reviewed: Basic Metabolic Panel:  Recent Labs Lab 01/17/14 1741 01/18/14 0945 01/19/14 0243 01/20/14 0745 01/20/14 1425  NA 138 142 144 143 143  K 3.9 4.8 4.7 4.7 4.3  CL 105 111 114* 113* 113*  CO2 19 21 20  18*  --   GLUCOSE 91 79 83 79 91  BUN  13 10 12 8 7   CREATININE 1.55* 1.25* 1.43* 1.14* 1.10  CALCIUM 7.9* 7.8* 7.7* 8.1*  --    Liver Function Tests: No results found for this basename: AST, ALT, ALKPHOS, BILITOT, PROT, ALBUMIN,  in the last 168 hours No results found for this basename: LIPASE, AMYLASE,  in the last 168 hours No results found for this basename: AMMONIA,  in the last 168 hours CBC:  Recent Labs Lab 01/17/14 1741 01/18/14 0945 01/19/14 0243 01/20/14 0745 01/20/14 1347 01/20/14 1425  WBC 7.3 5.6 5.4 5.6 8.3  --   NEUTROABS 3.8  --   --   --   --   --   HGB 8.9* 7.9* 7.9* 9.1* 6.4* 6.8*  HCT 27.5* 25.0* 25.8* 28.9* 20.4* 20.0*  MCV 85.1 87.4 88.1 87.6 87.9  --   PLT 225 159 164 195 229  --    Cardiac Enzymes: No results found for this basename: CKTOTAL, CKMB, CKMBINDEX, TROPONINI,  in the last 168 hours BNP (last 3 results) No results found for this basename: PROBNP,  in the last 8760 hours CBG: No results found for this basename: GLUCAP,  in the last 168 hours  Recent Results (from the past 240 hour(s))  MRSA PCR SCREENING     Status: None   Collection Time    01/20/14  3:38 PM      Result Value Ref Range Status   MRSA by PCR NEGATIVE  NEGATIVE Final   Comment:            The GeneXpert MRSA Assay (FDA     approved for NASAL specimens     only), is one component of a     comprehensive MRSA colonization     surveillance program. It is not     intended to diagnose MRSA     infection nor to guide or     monitor treatment for     MRSA infections.     Studies: Ct Angio Chest Aortic Dissect W &/or W/o  01/19/2014   CLINICAL DATA:  Left foot embolic disease, evaluate aorta for potential source lesion.  EXAM: CT ANGIOGRAPHY CHEST, ABDOMEN AND PELVIS  TECHNIQUE: Multidetector CT imaging through the chest, abdomen and pelvis was performed using the standard protocol during bolus administration of intravenous contrast. Multiplanar reconstructed images and MIPs were obtained and reviewed to evaluate  the vascular anatomy.  CONTRAST:  13mL OMNIPAQUE IOHEXOL 350 MG/ML SOLN  COMPARISON:  CTA chest, abdomen and pelvis performed 01/07/2014 at [facility].  FINDINGS: CTA CHEST FINDINGS  Mediastinum: Unremarkable CT appearance of the thyroid gland. No suspicious mediastinal or hilar adenopathy. No soft tissue mediastinal mass. The thoracic esophagus is unremarkable.  Heart/Vascular: Conventional 3 vessel aortic arch. No aneurysmal dilatation, acute dissection, irregular  plaque or wall adherent mural thrombus. Mild atherosclerotic calcification noted at the right coronary artery origin. The heart is within normal limits for size. No pericardial effusion. The left atrial appendage is well opacified. No evidence of thrombus. No atrial or ventricular thrombus identified.  Lungs/Pleura: Mild centrilobular emphysema. No suspicious pulmonary nodules. Mild biapical pleural parenchymal scarring. Trace dependent atelectasis and small bilateral effusions. The lungs are otherwise clear.  Bones/Soft Tissues: No acute fracture or aggressive appearing lytic or blastic osseous lesion.  Review of the MIP images confirms the above findings.  CTA ABDOMEN AND PELVIS FINDINGS  VASCULAR  Aorta: Scattered heterogeneous but smooth atherosclerotic plaque throughout the normal caliber abdominal aorta. No evidence of aneurysmal dilatation, plaque ulceration or wall adherent mural thrombus. The distal aorta is relatively small in caliber.  Celiac: Widely patent.  Conventional hepatic arterial anatomy.  SMA: Widely patent.  Renals: Single dominant renal arteries bilaterally. Mild atherosclerotic plaque results in mild narrowing of both arteries. The the left renal artery is slightly atretic.  IMA: Non calcified atherosclerotic plaque results and a moderate to high-grade stenosis at the origin of the inferior mesenteric artery.  Inflow: Heterogeneous atherosclerotic plaque. There are moderate bilateral common iliac artery stenoses. On the  left, just before the common iliac bifurcation there is a small focal partially calcified and non calcified atherosclerotic plaque. Correlation with the recent prior CTA study reveals that there was a larger ulcerative plaque with some adherent mural thrombus at this location which is no longer evident. This is almost certainly the source of the patient's embolic disease. The bilateral internal iliac arteries are patent.  Proximal Outflow: Mild heterogeneous plaque along the posterior wall of the bilateral common femoral arteries without significant stenosis. The proximal visualize superficial and profunda femoral arteries are patent and relatively disease free.  Veins: No focal venous abnormality given the limitations of non venous phase timing.  NON-VASCULAR  Abdomen: Unremarkable CT appearance of the stomach, duodenum, and pancreas. Abnormal mottled appearance of the spleen. Some of the mottling is similar to that seen on the prior study, however there is accentuation extending nearly to the cortex in the anterior and lower pole. This could simply represent normal splenic arterial profusion pattern, however the possibility of small splenic infarcts is not excluded entirely. There are several punctate calcifications which are unchanged compared to prior and likely reflecting the sequelae of prior granulomatous disease.  No suspicious hepatic lesion. Normal hepatic morphology and contour. There are 2 small calcified granulomas. Gallbladder is unremarkable. No intra or extrahepatic biliary ductal dilatation.  Stable appearance of the kidneys. The left kidney demonstrates diffuse renal cortical thinning, hydronephrosis and extensive urothelial thickening. A double-J ureteral stent is present in in unchanged position. There is nonspecific left greater than right perinephric stranding. On the right, loculated air are again noted in the upper pole collecting system. Stable right upper pole renal cortical cyst.  Partial  right hemicolectomy with patent ileocolonic anastomosis in the right hemi abdomen. No definite free fluid or suspicious adenopathy.  Pelvis: Left double-J ureteral stents in the bladder along with a Foley catheter. Air is also noted in the bladder lumen. The bladder wall is slightly thickened and hyper enhancing suggesting cystitis. Surgical clips noted along the right pelvic sidewall. Surgical changes of prior hysterectomy.  Bones/Soft Tissues: No acute fracture or aggressive appearing lytic or blastic osseous lesion. Subacute to remote fracture of the lateral left tenth rib.  Review of the MIP images confirms the above findings.  IMPRESSION: CTA CHEST  1. No acute vascular abnormality or potential source lesion to explain the patient's left lower extremity embolic event. 2. Mild atherosclerotic plaque including coronary artery disease. 3. Mild centrilobular pulmonary emphysema. 4. Interval development of trace bilateral pleural effusions and mild dependent lower lobe atelectasis. Otherwise, the lungs are clear.  CTA ABD/PELVIS  1. Comparison of today's CTA compared to the recent CTA chest/abdomen/pelvis performed at an outside institution on 01/07/2014 and demonstrates an acute difference in the appearance of a heterogeneous irregular plaque in the distal left common iliac artery just proximal to the bifurcation. On the prior study, there was an irregular plaque with adherent mural thrombus resulting in at least 75% narrowing of the vessel lumen. On the present study, there is only a relatively smooth residual plaque resulting in minimal stenosis. This almost certainly represents the source of the patient's left lower extremity embolic disease. 2. Somewhat irregular mottled appearance of the spleen appear significantly more prominent than seen on the recent prior CTA. While this is strongly favored to simply represent differences in arterial phase timing and splenic parenchymal perfusion, the possibility of lower  pole splenic infarcts is not excluded entirely. Recommend clinical correlation for for history of acute left upper quadrant pain. 3. Scattered atherosclerotic vascular disease as detailed above without evidence of acute dissection, aneurysm, ulcerative plaque or wall adherent thrombus. 4. Moderate stenoses in mid segments of the bilateral common iliac arteries. 5. Moderate to high-grade focal stenosis of the origin of the inferior mesenteric artery. 6. Stable position of a left-sided double-J ureteral stent with persistent left hydronephrosis and diffuse urothelial thickening and hyper enhancement extending from the renal collecting system to the bladder. Findings raise concern for acute, or chronic infection/inflammation. 7. Additional ancillary findings as above without significant interval change compared to recent prior imaging. Signed,  Criselda Peaches, MD  [facility]  [facility]   Electronically Signed   By: Jacqulynn Cadet M.D.   On: 01/19/2014 11:35   Ct Angio Abd/pel W/ And/or W/o  01/19/2014   CLINICAL DATA:  Left foot embolic disease, evaluate aorta for potential source lesion.  EXAM: CT ANGIOGRAPHY CHEST, ABDOMEN AND PELVIS  TECHNIQUE: Multidetector CT imaging through the chest, abdomen and pelvis was performed using the standard protocol during bolus administration of intravenous contrast. Multiplanar reconstructed images and MIPs were obtained and reviewed to evaluate the vascular anatomy.  CONTRAST:  58mL OMNIPAQUE IOHEXOL 350 MG/ML SOLN  COMPARISON:  CTA chest, abdomen and pelvis performed 01/07/2014 at [facility].  FINDINGS: CTA CHEST FINDINGS  Mediastinum: Unremarkable CT appearance of the thyroid gland. No suspicious mediastinal or hilar adenopathy. No soft tissue mediastinal mass. The thoracic esophagus is unremarkable.  Heart/Vascular: Conventional 3 vessel aortic arch. No aneurysmal dilatation, acute dissection, irregular plaque or  wall adherent mural thrombus. Mild atherosclerotic calcification noted at the right coronary artery origin. The heart is within normal limits for size. No pericardial effusion. The left atrial appendage is well opacified. No evidence of thrombus. No atrial or ventricular thrombus identified.  Lungs/Pleura: Mild centrilobular emphysema. No suspicious pulmonary nodules. Mild biapical pleural parenchymal scarring. Trace dependent atelectasis and small bilateral effusions. The lungs are otherwise clear.  Bones/Soft Tissues: No acute fracture or aggressive appearing lytic or blastic osseous lesion.  Review of the MIP images confirms the above findings.  CTA ABDOMEN AND PELVIS FINDINGS  VASCULAR  Aorta: Scattered heterogeneous but smooth atherosclerotic plaque throughout the normal caliber abdominal aorta. No evidence of aneurysmal dilatation, plaque ulceration or wall  adherent mural thrombus. The distal aorta is relatively small in caliber.  Celiac: Widely patent.  Conventional hepatic arterial anatomy.  SMA: Widely patent.  Renals: Single dominant renal arteries bilaterally. Mild atherosclerotic plaque results in mild narrowing of both arteries. The the left renal artery is slightly atretic.  IMA: Non calcified atherosclerotic plaque results and a moderate to high-grade stenosis at the origin of the inferior mesenteric artery.  Inflow: Heterogeneous atherosclerotic plaque. There are moderate bilateral common iliac artery stenoses. On the left, just before the common iliac bifurcation there is a small focal partially calcified and non calcified atherosclerotic plaque. Correlation with the recent prior CTA study reveals that there was a larger ulcerative plaque with some adherent mural thrombus at this location which is no longer evident. This is almost certainly the source of the patient's embolic disease. The bilateral internal iliac arteries are patent.  Proximal Outflow: Mild heterogeneous plaque along the posterior  wall of the bilateral common femoral arteries without significant stenosis. The proximal visualize superficial and profunda femoral arteries are patent and relatively disease free.  Veins: No focal venous abnormality given the limitations of non venous phase timing.  NON-VASCULAR  Abdomen: Unremarkable CT appearance of the stomach, duodenum, and pancreas. Abnormal mottled appearance of the spleen. Some of the mottling is similar to that seen on the prior study, however there is accentuation extending nearly to the cortex in the anterior and lower pole. This could simply represent normal splenic arterial profusion pattern, however the possibility of small splenic infarcts is not excluded entirely. There are several punctate calcifications which are unchanged compared to prior and likely reflecting the sequelae of prior granulomatous disease.  No suspicious hepatic lesion. Normal hepatic morphology and contour. There are 2 small calcified granulomas. Gallbladder is unremarkable. No intra or extrahepatic biliary ductal dilatation.  Stable appearance of the kidneys. The left kidney demonstrates diffuse renal cortical thinning, hydronephrosis and extensive urothelial thickening. A double-J ureteral stent is present in in unchanged position. There is nonspecific left greater than right perinephric stranding. On the right, loculated air are again noted in the upper pole collecting system. Stable right upper pole renal cortical cyst.  Partial right hemicolectomy with patent ileocolonic anastomosis in the right hemi abdomen. No definite free fluid or suspicious adenopathy.  Pelvis: Left double-J ureteral stents in the bladder along with a Foley catheter. Air is also noted in the bladder lumen. The bladder wall is slightly thickened and hyper enhancing suggesting cystitis. Surgical clips noted along the right pelvic sidewall. Surgical changes of prior hysterectomy.  Bones/Soft Tissues: No acute fracture or aggressive  appearing lytic or blastic osseous lesion. Subacute to remote fracture of the lateral left tenth rib.  Review of the MIP images confirms the above findings.  IMPRESSION: CTA CHEST  1. No acute vascular abnormality or potential source lesion to explain the patient's left lower extremity embolic event. 2. Mild atherosclerotic plaque including coronary artery disease. 3. Mild centrilobular pulmonary emphysema. 4. Interval development of trace bilateral pleural effusions and mild dependent lower lobe atelectasis. Otherwise, the lungs are clear.  CTA ABD/PELVIS  1. Comparison of today's CTA compared to the recent CTA chest/abdomen/pelvis performed at an outside institution on 01/07/2014 and demonstrates an acute difference in the appearance of a heterogeneous irregular plaque in the distal left common iliac artery just proximal to the bifurcation. On the prior study, there was an irregular plaque with adherent mural thrombus resulting in at least 75% narrowing of the vessel lumen. On the present study,  there is only a relatively smooth residual plaque resulting in minimal stenosis. This almost certainly represents the source of the patient's left lower extremity embolic disease. 2. Somewhat irregular mottled appearance of the spleen appear significantly more prominent than seen on the recent prior CTA. While this is strongly favored to simply represent differences in arterial phase timing and splenic parenchymal perfusion, the possibility of lower pole splenic infarcts is not excluded entirely. Recommend clinical correlation for for history of acute left upper quadrant pain. 3. Scattered atherosclerotic vascular disease as detailed above without evidence of acute dissection, aneurysm, ulcerative plaque or wall adherent thrombus. 4. Moderate stenoses in mid segments of the bilateral common iliac arteries. 5. Moderate to high-grade focal stenosis of the origin of the inferior mesenteric artery. 6. Stable position of a  left-sided double-J ureteral stent with persistent left hydronephrosis and diffuse urothelial thickening and hyper enhancement extending from the renal collecting system to the bladder. Findings raise concern for acute, or chronic infection/inflammation. 7. Additional ancillary findings as above without significant interval change compared to recent prior imaging. Signed,  Criselda Peaches, MD  [facility]  [facility]   Electronically Signed   By: Jacqulynn Cadet M.D.   On: 01/19/2014 11:35    Scheduled Meds: . amLODipine  5 mg Oral Daily  . [START ON 01/21/2014] docusate sodium  100 mg Oral Daily  . donepezil  10 mg Oral QHS  . linezolid  600 mg Oral Q12H  . magnesium oxide  400 mg Oral Daily  . metoprolol tartrate  25 mg Oral BID  . phenytoin  200 mg Oral QHS  . QUEtiapine  25 mg Oral QPM  . sodium chloride  3 mL Intravenous Q12H   Continuous Infusions: . sodium chloride 75 mL/hr at 01/20/14 1334  . sodium chloride 75 mL/hr at 01/20/14 0800    Principal Problem:   Critical lower limb ischemia Active Problems:   Protein-calorie malnutrition, severe    Time spent: 30 min    Yuette Putnam, [facility] Pager [phone]. If 7PM-7AM, please contact night-coverage at [email or web address], password [facility] 01/20/2014, 6:03 PM  LOS: 3 days

## 2014-01-20 NOTE — Progress Notes (Signed)
[facility]  Subjective  - Pt still confused, does not complain unless questioned   Objective 153/53 94 98 F (36.7 C) (Oral) 9 100%  Intake/Output Summary (Last 24 hours) at 01/20/14 1620 Last data filed at 01/20/14 1538  Gross per 24 hour  Intake   2620 ml  Output   3080 ml  Net   -460 ml   Left groin no hematoma Still some soreness left groin Follows commands reluctantly still very confused  Assessment/Planning: Stable currently transfusing for symptomatic anemia and some intraprocedure bleeding.  Will discuss films with Dr Bridgett Larsson tomorrow to decide left iliac stent vs antiplatelet management   Hold all anticoagulation for at least 24 hours due to bleeding during cath  Anika Shore E 01/20/2014 4:20 PM --  Laboratory Lab Results:  Recent Labs  01/20/14 0745 01/20/14 1347 01/20/14 1425  WBC 5.6 8.3  --   HGB 9.1* 6.4* 6.8*  HCT 28.9* 20.4* 20.0*  PLT 195 229  --    BMET  Recent Labs  01/19/14 0243 01/20/14 0745 01/20/14 1425  NA 144 143 143  K 4.7 4.7 4.3  CL 114* 113* 113*  CO2 20 18*  --   GLUCOSE 83 79 91  BUN 12 8 7   CREATININE 1.43* 1.14* 1.10  CALCIUM 7.7* 8.1*  --     COAG Lab Results  Component Value Date   INR 1.01 01/17/2014   INR 1.0 10/10/2007   No results found for this basename: PTT

## 2014-01-20 NOTE — Interval H&P Note (Signed)
History and Physical Interval Note:  01/20/2014 11:12 AM  [name]  has presented today for surgery, with the diagnosis of claudication   The various methods of treatment have been discussed with the patient and family. After consideration of risks, benefits and other options for treatment, the patient has consented to  Procedure(s): ABDOMINAL ANGIOGRAM (N/A) as a surgical intervention .  The patient's history has been reviewed, patient examined, no change in status, stable for surgery.  I have reviewed the patient's chart and labs.  Questions were answered to the patient's satisfaction.     FIELDS,CHARLES E

## 2014-01-21 ENCOUNTER — Other Ambulatory Visit: Payer: Self-pay

## 2014-01-21 DIAGNOSIS — F03918 Unspecified dementia, unspecified severity, with other behavioral disturbance: Secondary | ICD-10-CM

## 2014-01-21 DIAGNOSIS — R410 Disorientation, unspecified: Secondary | ICD-10-CM

## 2014-01-21 DIAGNOSIS — F0391 Unspecified dementia with behavioral disturbance: Secondary | ICD-10-CM

## 2014-01-21 DIAGNOSIS — G9340 Encephalopathy, unspecified: Secondary | ICD-10-CM

## 2014-01-21 DIAGNOSIS — D62 Acute posthemorrhagic anemia: Secondary | ICD-10-CM

## 2014-01-21 DIAGNOSIS — F05 Delirium due to known physiological condition: Secondary | ICD-10-CM

## 2014-01-21 LAB — URINE CULTURE
Colony Count: NO GROWTH
Culture: NO GROWTH

## 2014-01-21 LAB — BASIC METABOLIC PANEL
BUN: 14 mg/dL (ref 6–23)
CHLORIDE: 113 meq/L — AB (ref 96–112)
CO2: 14 meq/L — AB (ref 19–32)
CREATININE: 1.21 mg/dL — AB (ref 0.50–1.10)
Calcium: 7.6 mg/dL — ABNORMAL LOW (ref 8.4–10.5)
GFR calc Af Amer: 54 mL/min — ABNORMAL LOW (ref >90)
GFR calc non Af Amer: 47 mL/min — ABNORMAL LOW (ref >90)
Glucose, Bld: 78 mg/dL (ref 70–99)
Potassium: 4.8 mEq/L (ref 3.7–5.3)
Sodium: 142 mEq/L (ref 137–147)

## 2014-01-21 LAB — CBC
HCT: 24.9 % — ABNORMAL LOW (ref 36.0–46.0)
Hemoglobin: 8.2 g/dL — ABNORMAL LOW (ref 12.0–15.0)
MCH: 28.2 pg (ref 26.0–34.0)
MCHC: 32.9 g/dL (ref 30.0–36.0)
MCV: 85.6 fL (ref 78.0–100.0)
Platelets: 138 10*3/uL — ABNORMAL LOW (ref 150–400)
RBC: 2.91 MIL/uL — AB (ref 3.87–5.11)
RDW: 15.8 % — ABNORMAL HIGH (ref 11.5–15.5)
WBC: 7.6 10*3/uL (ref 4.0–10.5)

## 2014-01-21 MED ORDER — DEXTROSE 5 % IV SOLN
1.5000 g | INTRAVENOUS | Status: AC
  Filled 2014-01-21: qty 1.5

## 2014-01-21 MED ORDER — ENSURE PUDDING PO PUDG
1.0000 | Freq: Three times a day (TID) | ORAL | Status: DC
  Administered 2014-01-21 – 2014-01-26 (×7): 1 via ORAL

## 2014-01-21 MED ORDER — PHENYTOIN SODIUM EXTENDED 100 MG PO CAPS: 200.0000 mg | ORAL_CAPSULE | Freq: Every day | ORAL | Status: DC

## 2014-01-21 MED ORDER — WHITE PETROLATUM GEL
Status: AC
Start: 2014-01-21 — End: 2014-01-21
  Administered 2014-01-21: 0.2
  Filled 2014-01-21: qty 5

## 2014-01-21 MED ORDER — ENSURE COMPLETE PO LIQD
237.0000 mL | Freq: Two times a day (BID) | ORAL | Status: DC
  Administered 2014-01-21 – 2014-01-23 (×5): 237 mL via ORAL

## 2014-01-21 NOTE — Progress Notes (Signed)
NUTRITION FOLLOW-UP  DOCUMENTATION CODES Per approved criteria  -Severe malnutrition in the context of chronic illness -Underweight   INTERVENTION: Add Ensure Complete po BID, each supplement provides 350 kcal and 13 grams of protein. RD to continue to follow nutrition care plan.  NUTRITION DIAGNOSIS: Inadequate oral intake now related to limited appetite and AMS AEB variable oral intake.  Goal: Pt to meet >/= 90% of their estimated nutrition needs   Monitor:  PO & supplemental intake, weight, labs, I/O's  ASSESSMENT: Patient with PMH of renal disorder, dementia and UTI; presented to the ED with L foot pain.  Work-up reveals thrombembolic activity to L foot. Continues on zyvox for UTI, this is a medication with potential interactions with high tyramine-containing foods.  Hospitalized diet is not high in tyramine and poses no risk for patient once advanced.  Underwent aortogram on 2/11 for ischemic L foot.  Diet advanced to Regular on 12/12. Pt is consuming about 50% of meals.  Patient meets criteria for severe malnutrition in the context of chronic illness as evidenced by < 75% intake of estimated energy requirement for > 1 month, severe muscle loss and severe subcutaneous fat loss.    Height: Ht Readings from Last 1 Encounters:  01/18/14 5\' 2"  (1.575 m)    Weight: Wt Readings from Last 1 Encounters:  01/18/14 81 lb (36.74 kg)  Admit wt 81 lb - stable  BMI:  Body mass index is 14.81 kg/(m^2). Underweight  Estimated Nutritional Needs: Kcal: 1300-1500 Protein: 65-75 gm Fluid: >/= 1.5 L  Skin: L groin incision  Diet Order: General  EDUCATION NEEDS: -No education needs identified at this time   Intake/Output Summary (Last 24 hours) at 01/21/14 1046 Last data filed at 01/21/14 0800  Gross per 24 hour  Intake 1752.5 ml  Output    950 ml  Net  802.5 ml    Last BM: 2/9  Labs:   Recent Labs Lab 01/19/14 0243 01/20/14 0745 01/20/14 1425 01/21/14 0235   NA 144 143 143 142  K 4.7 4.7 4.3 4.8  CL 114* 113* 113* 113*  CO2 20 18*  --  14*  BUN 12 8 7 14   CREATININE 1.43* 1.14* 1.10 1.21*  CALCIUM 7.7* 8.1*  --  7.6*  GLUCOSE 83 79 91 78    Scheduled Meds: . amLODipine  5 mg Oral Daily  . docusate sodium  100 mg Oral Daily  . donepezil  10 mg Oral QHS  . linezolid  600 mg Oral Q12H  . magnesium oxide  400 mg Oral Daily  . metoprolol tartrate  25 mg Oral BID  . phenytoin  200 mg Oral QHS  . QUEtiapine  25 mg Oral QHS,MR X 1  . saccharomyces boulardii  250 mg Oral BID  . sodium chloride  3 mL Intravenous Q12H    Continuous Infusions: . sodium chloride Stopped (01/20/14 1934)    Inda Coke MS, RD, LDN Inpatient Registered Dietitian Pager: [phone] After-hours pager: [phone]

## 2014-01-21 NOTE — Progress Notes (Signed)
Utilization review completed.  

## 2014-01-21 NOTE — Progress Notes (Signed)
[facility] Progress Note [place] TEAM 1 - Stepdown/ICU TEAM   [name] [id number] DOB: 1950/12/11 DOA: 01/17/2014 PCP: Teressa Lower, MD  Brief narrative: [name] is a 63 y.o. female presenting on 01/17/2014 with  has a past medical history of Renal disorder; Subacute delirium (10/14/2013); Alzheimer's dementia; Toxic encephalopathy; and UTI who presented to [facility] with left foot pain and was found to have ischemia. Vascular surgery was consulted, recommended transfer to [facility] where she underwent an angiogram on 2/11.    Subjective: Confused. C/o pain but cannot tell me specifically where - states "all over". When I touch her thighs, she tell me not to touch as it hurts. RN states she just gave her pain medication.   Assessment/Plan: Principal Problem:   Critical lower limb ischemia - management per vascular surgery - no intervention perfomed on 2/11 as there was extravasation of dye noted as pt was not able to lay still for the procedure - they recommend stent of left common iliac under general anaesthesia on Tuesday  Active Problems:   Acute blood loss anemia - during angiogram 2/11 - given 2 U PRBC - follow H and H Q 12    ALZHEIMERS DISEASE - cont home meds and follow   Previous VRE UTI present on admission - continue Zyvox through 2/13, then stop. - repeating urine culture   Acute renal failure - resolved with hydration.   History of C diff - - has  not recently been taking fidaxomycin as an outpatient. This was verified by pharmacy - Monitor for diarrhea  - Started florastor   Severe protein calorie malnutrition  - add TID ensure- per RN PO intake was poor this AM    Code Status: full code Family Communication: none today Disposition Plan: follow in SDU  Consultants: Vascular sx   Procedures: ABI  LE BL duplex  CT angio chest/abd/pelvis 2/10  Arteriogram 2/11  Antibiotics: linezolid 2/9 (started prior to 2/9) >>  DVT  prophylaxis: SCDs  Objective: Filed Weights   01/17/14 2144 01/18/14 0113  Weight: 36.741 kg (81 lb) 36.74 kg (81 lb)   Blood pressure 118/69, pulse 76, temperature 98.2 F (36.8 C), temperature source Oral, resp. rate 16, height 5\' 2"  (1.575 m), weight 36.74 kg (81 lb), SpO2 99.00%.  Intake/Output Summary (Last 24 hours) at 01/21/14 1706 Last data filed at 01/21/14 1500  Gross per 24 hour  Intake 1392.5 ml  Output   1300 ml  Net   92.5 ml     Exam: General: confused, agitated, distressed No acute respiratory distress Lungs: Clear to auscultation bilaterally without wheezes or crackles Cardiovascular: Regular rate and rhythm without murmur gallop or rub normal S1 and S2 Abdomen: Nontender, nondistended, soft, bowel sounds positive, no rebound, no ascites, no appreciable mass Extremities: No significant clubbing, or edema bilateral lower extremities- bluish discoloration of left toes noted  Data Reviewed: Basic Metabolic Panel:  Recent Labs Lab 01/17/14 1741 01/18/14 0945 01/19/14 0243 01/20/14 0745 01/20/14 1425 01/21/14 0235  NA 138 142 144 143 143 142  K 3.9 4.8 4.7 4.7 4.3 4.8  CL 105 111 114* 113* 113* 113*  CO2 19 21 20  18*  --  14*  GLUCOSE 91 79 83 79 91 78  BUN 13 10 12 8 7 14   CREATININE 1.55* 1.25* 1.43* 1.14* 1.10 1.21*  CALCIUM 7.9* 7.8* 7.7* 8.1*  --  7.6*   Liver Function Tests: No results found for this basename: AST, ALT, ALKPHOS, BILITOT, PROT, ALBUMIN,  in the last 168 hours No results found for this basename: LIPASE, AMYLASE,  in the last 168 hours No results found for this basename: AMMONIA,  in the last 168 hours CBC:  Recent Labs Lab 01/17/14 1741 01/18/14 0945 01/19/14 0243 01/20/14 0745 01/20/14 1347 01/20/14 1425 01/21/14 0235  WBC 7.3 5.6 5.4 5.6 8.3  --  7.6  NEUTROABS 3.8  --   --   --   --   --   --   HGB 8.9* 7.9* 7.9* 9.1* 6.4* 6.8* 8.2*  HCT 27.5* 25.0* 25.8* 28.9* 20.4* 20.0* 24.9*  MCV 85.1 87.4 88.1 87.6 87.9  --   85.6  PLT 225 159 164 195 229  --  138*   Cardiac Enzymes: No results found for this basename: CKTOTAL, CKMB, CKMBINDEX, TROPONINI,  in the last 168 hours BNP (last 3 results) No results found for this basename: PROBNP,  in the last 8760 hours CBG: No results found for this basename: GLUCAP,  in the last 168 hours  Recent Results (from the past 240 hour(s))  MRSA PCR SCREENING     Status: None   Collection Time    01/20/14  3:38 PM      Result Value Ref Range Status   MRSA by PCR NEGATIVE  NEGATIVE Final   Comment:            The GeneXpert MRSA Assay (FDA     approved for NASAL specimens     only), is one component of a     comprehensive MRSA colonization     surveillance program. It is not     intended to diagnose MRSA     infection nor to guide or     monitor treatment for     MRSA infections.     Studies:  Recent x-ray studies have been reviewed in detail by the Attending Physician  Scheduled Meds:  Scheduled Meds: . amLODipine  5 mg Oral Daily  . [START ON 01/24/2014] cefUROXime (ZINACEF)  IV  1.5 g Intravenous On Call to OR  . docusate sodium  100 mg Oral Daily  . donepezil  10 mg Oral QHS  . feeding supplement (ENSURE COMPLETE)  237 mL Oral BID BM  . feeding supplement (ENSURE)  1 Container Oral TID BM  . linezolid  600 mg Oral Q12H  . magnesium oxide  400 mg Oral Daily  . metoprolol tartrate  25 mg Oral BID  . phenytoin  200 mg Oral QHS  . QUEtiapine  25 mg Oral QHS,MR X 1  . saccharomyces boulardii  250 mg Oral BID  . sodium chloride  3 mL Intravenous Q12H   Continuous Infusions: . sodium chloride Stopped (01/20/14 1934)    Time spent on care of this patient: 64 min   [place], MD  [facility] Office  [phone] Pager - Text Page per Shea Evans as per below:  On-Call/Text Page:      [email or web address]      password [email or web address]  If 7PM-7AM, please contact night-coverage [email or web address] Password [email or web address] 01/21/2014, 5:06 PM   LOS: 4 days

## 2014-01-21 NOTE — Progress Notes (Addendum)
[facility]   Subjective  - Confused alert. NAD.   Objective 127/64 75 98.3 F (36.8 C) (Oral) 13 100%  Intake/Output Summary (Last 24 hours) at 01/21/14 0731 Last data filed at 01/21/14 0600  Gross per 24 hour  Intake 1807.5 ml  Output   2105 ml  Net -297.5 ml    Doppler signals DP/PT/Ant tib bilateral biphasic No change is ischemic toes.  Assessment/Planning: POD #1 #1 irregular surface left mid common iliac with no flow-limiting stenosis #2 three-vessel runoff in continuity bilaterally   Transfused 2 units PRBC HGB 8.2 this am BP stable I will ask Dr. Oneida Alar if we can re-start Heparin and plavix    Theda Sers [facility] 01/21/2014 7:31 AM --  Laboratory Lab Results:  Recent Labs  01/20/14 1347 01/20/14 1425 01/21/14 0235  WBC 8.3  --  7.6  HGB 6.4* 6.8* 8.2*  HCT 20.4* 20.0* 24.9*  PLT 229  --  138*   BMET  Recent Labs  01/20/14 0745 01/20/14 1425 01/21/14 0235  NA 143 143 142  K 4.7 4.3 4.8  CL 113* 113* 113*  CO2 18*  --  14*  GLUCOSE 79 91 78  BUN 8 7 14   CREATININE 1.14* 1.10 1.21*  CALCIUM 8.1*  --  7.6*    COAG Lab Results  Component Value Date   INR 1.01 01/17/2014   INR 1.0 10/10/2007   No results found for this basename: PTT   Stable overnight, hemoglobin 6-8 with transfusion Right groin no hematoma appropriate soreness Very confused Left foot stable Angio findings discussed with Dr Bridgett Larsson.  He will review today and make a decision on medical mgmt vs stent left iliac Advance diet, ok to transfer out of stepdown.   Ruta Hinds, MD [facility] [place] Office: [phone] Pager: [phone]  Addendum  I have reviewed the images and discussed the findings with the husband.  The images demonstrate a flow obstructing plaque in the left common iliac artery.  I suspect the patient will reaccumulate thrombus in her CIA without intervention.  Unfortunately, her  inability to hold still resulted in a cannulation complication, which delayed her intervention.  I would plan on general anesthesia with LMA on this coming Tuesday and then stenting the left CIA to cover the plaque any thrombus present.  Meanwhile, I would resume anticoagulation tomorrow and Plavix.  [name], MD [facility] [place] Office: [phone] Pager: [phone]  01/21/2014, 11:52 AM

## 2014-01-22 ENCOUNTER — Encounter: Payer: Self-pay | Admitting: Surgery

## 2014-01-22 DIAGNOSIS — N200 Calculus of kidney: Secondary | ICD-10-CM

## 2014-01-22 LAB — CBC
HCT: 23 % — ABNORMAL LOW (ref 36.0–46.0)
HEMOGLOBIN: 7.7 g/dL — AB (ref 12.0–15.0)
MCH: 28.8 pg (ref 26.0–34.0)
MCHC: 33.5 g/dL (ref 30.0–36.0)
MCV: 86.1 fL (ref 78.0–100.0)
PLATELETS: 112 10*3/uL — AB (ref 150–400)
RBC: 2.67 MIL/uL — ABNORMAL LOW (ref 3.87–5.11)
RDW: 16.6 % — ABNORMAL HIGH (ref 11.5–15.5)
WBC: 6.2 10*3/uL (ref 4.0–10.5)

## 2014-01-22 LAB — BASIC METABOLIC PANEL
BUN: 16 mg/dL (ref 6–23)
CO2: 19 mEq/L (ref 19–32)
Calcium: 8 mg/dL — ABNORMAL LOW (ref 8.4–10.5)
Chloride: 112 mEq/L (ref 96–112)
Creatinine, Ser: 1.27 mg/dL — ABNORMAL HIGH (ref 0.50–1.10)
GFR, EST AFRICAN AMERICAN: 51 mL/min — AB (ref >90)
GFR, EST NON AFRICAN AMERICAN: 44 mL/min — AB (ref >90)
GLUCOSE: 100 mg/dL — AB (ref 70–99)
POTASSIUM: 4.8 meq/L (ref 3.7–5.3)
SODIUM: 142 meq/L (ref 137–147)

## 2014-01-22 LAB — HEPARIN LEVEL (UNFRACTIONATED): HEPARIN UNFRACTIONATED: 0.33 [IU]/mL (ref 0.30–0.70)

## 2014-01-22 LAB — PREPARE RBC (CROSSMATCH)

## 2014-01-22 MED ORDER — OLANZAPINE 2.5 MG PO TABS
2.5000 mg | ORAL_TABLET | Freq: Every day | ORAL | Status: DC
  Administered 2014-01-22 – 2014-01-27 (×6): 2.5 mg via ORAL
  Filled 2014-01-22 (×6): qty 1

## 2014-01-22 MED ORDER — ENOXAPARIN SODIUM 40 MG/0.4ML ~~LOC~~ SOLN
1.0000 mg/kg | SUBCUTANEOUS | Status: DC
  Administered 2014-01-23: 35 mg via SUBCUTANEOUS
  Filled 2014-01-22 (×2): qty 0.4

## 2014-01-22 MED ORDER — OLANZAPINE 2.5 MG PO TABS
2.5000 mg | ORAL_TABLET | Freq: Once | ORAL | Status: AC
  Administered 2014-01-22: 2.5 mg via ORAL
  Filled 2014-01-22: qty 1

## 2014-01-22 MED ORDER — ENOXAPARIN SODIUM 40 MG/0.4ML ~~LOC~~ SOLN
1.0000 mg/kg | Freq: Two times a day (BID) | SUBCUTANEOUS | Status: DC
  Administered 2014-01-22: 35 mg via SUBCUTANEOUS
  Filled 2014-01-22 (×2): qty 0.4

## 2014-01-22 MED ORDER — HYDROMORPHONE HCL 2 MG PO TABS
1.0000 mg | ORAL_TABLET | ORAL | Status: DC | PRN
  Administered 2014-01-22 – 2014-01-27 (×18): 2 mg via ORAL
  Filled 2014-01-22 (×18): qty 1

## 2014-01-22 MED ORDER — CLOPIDOGREL BISULFATE 75 MG PO TABS
75.0000 mg | ORAL_TABLET | Freq: Every day | ORAL | Status: DC
  Administered 2014-01-22 – 2014-01-27 (×6): 75 mg via ORAL
  Filled 2014-01-22 (×8): qty 1

## 2014-01-22 MED ORDER — QUETIAPINE FUMARATE 50 MG PO TABS
50.0000 mg | ORAL_TABLET | Freq: Every evening | ORAL | Status: DC | PRN
  Administered 2014-01-22 – 2014-01-26 (×8): 50 mg via ORAL
  Filled 2014-01-22 (×12): qty 1

## 2014-01-22 MED ORDER — HEPARIN (PORCINE) IN NACL 100-0.45 UNIT/ML-% IJ SOLN
850.0000 [IU]/h | INTRAMUSCULAR | Status: DC
  Filled 2014-01-22: qty 250

## 2014-01-22 NOTE — Progress Notes (Signed)
Dr. Wynelle Cleveland was notified of patient's continued moaning and restlessness.  No IV access obtained yet.  Orders received to increase frequency of pain med.

## 2014-01-22 NOTE — Progress Notes (Addendum)
ANTICOAGULATION CONSULT NOTE - Follow Up Consult  Pharmacy Consult for heparin Indication: thromboembolic event in LLE  Allergies  Allergen Reactions  . Codeine     REACTION: intolerance    Patient Measurements: Height: 5\' 2"  (157.5 cm) Weight: 81 lb (36.74 kg) IBW/kg (Calculated) : 50.1  Vital Signs: Temp: 98.3 F (36.8 C) (02/13 1230) Temp src: Oral (02/13 1230) BP: 155/54 mmHg (02/13 1230) Pulse Rate: 71 (02/13 1230)  Labs:  Recent Labs  01/20/14 0403  01/20/14 1347 01/20/14 1425 01/21/14 0235 01/22/14 0440  HGB  --   < > 6.4* 6.8* 8.2* 7.7*  HCT  --   < > 20.4* 20.0* 24.9* 23.0*  PLT  --   < > 229  --  138* 112*  HEPARINUNFRC 0.32  --   --   --   --   --   CREATININE  --   < >  --  1.10 1.21* 1.27*  < > = values in this interval not displayed.  Estimated Creatinine Clearance: 26.6 ml/min (by C-G formula based on Cr of 1.27).   Medications:  Scheduled:  . amLODipine  5 mg Oral Daily  . [START ON 01/24/2014] cefUROXime (ZINACEF)  IV  1.5 g Intravenous On Call to OR  . clopidogrel  75 mg Oral Q breakfast  . docusate sodium  100 mg Oral Daily  . donepezil  10 mg Oral QHS  . feeding supplement (ENSURE COMPLETE)  237 mL Oral BID BM  . feeding supplement (ENSURE)  1 Container Oral TID BM  . magnesium oxide  400 mg Oral Daily  . metoprolol tartrate  25 mg Oral BID  . OLANZapine  2.5 mg Oral Daily  . phenytoin  200 mg Oral QHS  . QUEtiapine  50 mg Oral QHS,MR X 1  . saccharomyces boulardii  250 mg Oral BID  . sodium chloride  3 mL Intravenous Q12H   Infusions:  . sodium chloride Stopped (01/20/14 1934)    Assessment: 63 year old female on IV heparin for left foot thromboembolic event with possible chronic PVD.  Chronic anemia, hgb 7.7 noted, platelets 112 today.  Baseline INR 1.01.  Patient was previously on heparin 2/8-2/11, will initiate heparin at previous rate.  Pt to undergo stenting of illiac artery on Tues 2/17.  Goal of Therapy:  Heparin level  0.3-0.7 units/ml Monitor platelets by anticoagulation protocol: Yes   Plan:   -Initiate heparin 850 units/hr -HL at 2230 -Daily HL/CBC  Hughes Better, PharmD, BCPS Clinical Pharmacist 01/22/2014 1:39 PM

## 2014-01-22 NOTE — Progress Notes (Addendum)
[facility] [place]  Subjective  - alert not oriented.   Objective 121/58 74 98.3 F (36.8 C) (Oral) 11 97%  Intake/Output Summary (Last 24 hours) at 01/22/14 0735 Last data filed at 01/22/14 0600  Gross per 24 hour  Intake    230 ml  Output   1025 ml  Net   -795 ml    Doppler DP/PT bil. Biphasic Left groin palpable femoral pulse, no gross hematoma.  Assessment/Planning: POD #2 s/p angiogram irregular surface left mid common iliac with no flow-limiting stenosis #2 three-vessel runoff in continuity bilaterally Plan Iliac stent main OR Tuesday 01/26/2014 OK to transfer to regular floor.    Laurence Slate [facility] 01/22/2014 7:35 AM --  Laboratory Lab Results:  Recent Labs  01/21/14 0235 01/22/14 0440  WBC 7.6 6.2  HGB 8.2* 7.7*  HCT 24.9* 23.0*  PLT 138* 112*   BMET  Recent Labs  01/21/14 0235 01/22/14 0440  NA 142 142  K 4.8 4.8  CL 113* 112  CO2 14* 19  GLUCOSE 78 100*  BUN 14 16  CREATININE 1.21* 1.27*  CALCIUM 7.6* 8.0*    COAG Lab Results  Component Value Date   INR 1.01 01/17/2014   INR 1.0 10/10/2007   No results found for this basename: PTT   Addendum  I agree with the physician assistant's findings.  Will plan general anesthesia with planned covered stenting of L CIA in hybrid OR 16.  Pt's husband has agreed to proceed.  I discussed with the patient's husband the nature of angiographic procedures, especially the limited patencies of any endovascular intervention.  The patient's husband is aware of that the risks of an angiographic procedure include but are not limited to: bleeding, infection, access site complications, renal failure, embolization, rupture of vessel, dissection, possible need for emergent surgical intervention, possible need for surgical procedures to treat the patient's pathology, anaphylactic reaction to contrast, and stroke and death.  The patient and family is aware of the risks and agrees to  proceed.   [name], MD [facility] [place] Office: [phone] Pager: [phone]  01/22/2014, 10:24 AM

## 2014-01-22 NOTE — Progress Notes (Addendum)
[facility] Progress Note [place] TEAM 1 - Stepdown/ICU TEAM   TEAGHEN HEDSTROM [id number] DOB: September 09, 1951 DOA: 01/17/2014 PCP: Teressa Lower, MD  Brief narrative: [name] is a 63 y.o. female presenting on 01/17/2014 with  has a past medical history of Renal disorder; Subacute delirium (10/14/2013); Alzheimer's dementia; Toxic encephalopathy; and UTI who presented to [facility] with left foot pain and was found to have ischemia. Vascular surgery was consulted, recommended transfer to [facility] where she underwent an angiogram on 2/11.    Subjective:  Crying and stating that she feels bad- per husband and son she has similar behavior at home but it is much worse here. Did not sleep much last night. She has no other complaints and seem to comprehend what I say and answers appropriately despite the crying.   Assessment/Plan: Principal Problem:   Critical lower limb ischemia - management per vascular surgery - no intervention perfomed on 2/11 as there was extravasation of dye noted as pt was not able to lay still for the procedure - they recommend stent of left common iliac under general anaesthesia on Tuesday  Active Problems:   Acute blood loss anemia - during angiogram 2/11 - given 2 U PRBC on 2/11 and 1 U again today - follow H and H Q 12  Thrombocytopenia - possible due to consumption- follow    ALZHEIMERS DISEASE - cont home meds  - increase Seroquel at bedtime - try Zyprexa during the day   Previous VRE UTI present on admission- Renal calculus with Double J stent in left kidney - last day of Zyvox today- 2/13 - nonspecific left sided peri-nephric stranding noted on CT along which changed consistent with cystitis - repeat urine culture negative although large leukocytes in urine - follow WBC count and temp off antibiotics  Acute renal failure - resolved with hydration.   History of C diff - - has  not recently been taking fidaxomycin as an outpatient. This was verified by  pharmacy - Monitor for diarrhea  - Started florastor  Severe protein calorie malnutrition  - add TID ensure- per RN PO intake is extremely poor- per family, she barely eats at home - adjusting psych meds to see if agitation can be controlled and subsequently will regain an appetite  IV access - has lost IV and not able to obtain another - will need PICC line  Partial hemicolectomy with ileocolic anastamosis    Code Status: full code Family Communication: with husband and son at bedside Disposition Plan: follow in SDU  Consultants: Vascular sx   Procedures: ABI  LE BL duplex  CT angio chest/abd/pelvis 2/10  Arteriogram 2/11  Antibiotics: linezolid 2/9 (started prior to 2/9) >>  DVT prophylaxis: SCDs  Objective: Filed Weights   01/17/14 2144 01/18/14 0113  Weight: 36.741 kg (81 lb) 36.74 kg (81 lb)   Blood pressure 155/54, pulse 71, temperature 98.3 F (36.8 C), temperature source Oral, resp. rate 11, height 5\' 2"  (1.575 m), weight 36.74 kg (81 lb), SpO2 100.00%.  Intake/Output Summary (Last 24 hours) at 01/22/14 1505 Last data filed at 01/22/14 1400  Gross per 24 hour  Intake  822.5 ml  Output    850 ml  Net  -27.5 ml     Exam: General: confused, agitated, distressed No acute respiratory distress Lungs: Clear to auscultation bilaterally without wheezes or crackles Cardiovascular: Regular rate and rhythm without murmur gallop or rub normal S1 and S2 Abdomen: Nontender, nondistended, soft, bowel sounds positive, no rebound, no  ascites, no appreciable mass Extremities: No significant clubbing, or edema bilateral lower extremities- bluish discoloration of left toes noted  Data Reviewed: Basic Metabolic Panel:  Recent Labs Lab 01/18/14 0945 01/19/14 0243 01/20/14 0745 01/20/14 1425 01/21/14 0235 01/22/14 0440  NA 142 144 143 143 142 142  K 4.8 4.7 4.7 4.3 4.8 4.8  CL 111 114* 113* 113* 113* 112  CO2 21 20 18*  --  14* 19  GLUCOSE 79 83 79 91 78 100*   BUN 10 12 8 7 14 16   CREATININE 1.25* 1.43* 1.14* 1.10 1.21* 1.27*  CALCIUM 7.8* 7.7* 8.1*  --  7.6* 8.0*   Liver Function Tests: No results found for this basename: AST, ALT, ALKPHOS, BILITOT, PROT, ALBUMIN,  in the last 168 hours No results found for this basename: LIPASE, AMYLASE,  in the last 168 hours No results found for this basename: AMMONIA,  in the last 168 hours CBC:  Recent Labs Lab 01/17/14 1741  01/19/14 0243 01/20/14 0745 01/20/14 1347 01/20/14 1425 01/21/14 0235 01/22/14 0440  WBC 7.3  < > 5.4 5.6 8.3  --  7.6 6.2  NEUTROABS 3.8  --   --   --   --   --   --   --   HGB 8.9*  < > 7.9* 9.1* 6.4* 6.8* 8.2* 7.7*  HCT 27.5*  < > 25.8* 28.9* 20.4* 20.0* 24.9* 23.0*  MCV 85.1  < > 88.1 87.6 87.9  --  85.6 86.1  PLT 225  < > 164 195 229  --  138* 112*  < > = values in this interval not displayed. Cardiac Enzymes: No results found for this basename: CKTOTAL, CKMB, CKMBINDEX, TROPONINI,  in the last 168 hours BNP (last 3 results) No results found for this basename: PROBNP,  in the last 8760 hours CBG: No results found for this basename: GLUCAP,  in the last 168 hours  Recent Results (from the past 240 hour(s))  URINE CULTURE     Status: None   Collection Time    01/20/14  8:24 AM      Result Value Ref Range Status   Specimen Description URINE, RANDOM   Final   Special Requests ADDED [id number]   Final   Culture  Setup Time     Final   Value: 01/21/2014 01:48     Performed at [place]     Final   Value: NO GROWTH     Performed at [facility]   Culture     Final   Value: NO GROWTH     Performed at [facility]   Report Status 01/21/2014 FINAL   Final  MRSA PCR SCREENING     Status: None   Collection Time    01/20/14  3:38 PM      Result Value Ref Range Status   MRSA by PCR NEGATIVE  NEGATIVE Final   Comment:            The GeneXpert MRSA Assay (FDA     approved for NASAL specimens     only), is one component  of a     comprehensive MRSA colonization     surveillance program. It is not     intended to diagnose MRSA     infection nor to guide or     monitor treatment for     MRSA infections.     Studies:  Recent x-ray studies have been  reviewed in detail by the Attending Physician  Scheduled Meds:  Scheduled Meds: . amLODipine  5 mg Oral Daily  . [START ON 01/24/2014] cefUROXime (ZINACEF)  IV  1.5 g Intravenous On Call to OR  . clopidogrel  75 mg Oral Q breakfast  . docusate sodium  100 mg Oral Daily  . donepezil  10 mg Oral QHS  . feeding supplement (ENSURE COMPLETE)  237 mL Oral BID BM  . feeding supplement (ENSURE)  1 Container Oral TID BM  . magnesium oxide  400 mg Oral Daily  . metoprolol tartrate  25 mg Oral BID  . OLANZapine  2.5 mg Oral Daily  . phenytoin  200 mg Oral QHS  . QUEtiapine  50 mg Oral QHS,MR X 1  . saccharomyces boulardii  250 mg Oral BID  . sodium chloride  3 mL Intravenous Q12H   Continuous Infusions: . sodium chloride Stopped (01/20/14 1934)  . heparin      Time spent on care of this patient: 33 min   Debbe Odea, MD  [facility] Office  [phone] Pager - Text Page per Shea Evans as per below:  On-Call/Text Page:      [email or web address]      password [email or web address]  If 7PM-7AM, please contact night-coverage [email or web address] Password [email or web address] 01/22/2014, 3:05 PM   LOS: 5 days

## 2014-01-22 NOTE — Progress Notes (Signed)
Pt currently off heparin d/t loss of IV access, started Lovenox for LLE VTE.  Will change to 1mg /kg Q24H given CrCl <30 ml/min and monitor whether to change back to heparin prior to or after stenting.  Wynona Neat, PharmD, BCPS 01/22/2014 11:58 PM

## 2014-01-23 ENCOUNTER — Inpatient Hospital Stay (HOSPITAL_COMMUNITY): Payer: Medicare Other

## 2014-01-23 LAB — CBC
HCT: 30.4 % — ABNORMAL LOW (ref 36.0–46.0)
HEMOGLOBIN: 10 g/dL — AB (ref 12.0–15.0)
MCH: 28.6 pg (ref 26.0–34.0)
MCHC: 32.9 g/dL (ref 30.0–36.0)
MCV: 86.9 fL (ref 78.0–100.0)
Platelets: 119 10*3/uL — ABNORMAL LOW (ref 150–400)
RBC: 3.5 MIL/uL — AB (ref 3.87–5.11)
RDW: 16.5 % — ABNORMAL HIGH (ref 11.5–15.5)
WBC: 7.2 10*3/uL (ref 4.0–10.5)

## 2014-01-23 LAB — BASIC METABOLIC PANEL
BUN: 13 mg/dL (ref 6–23)
CO2: 21 meq/L (ref 19–32)
Calcium: 8.2 mg/dL — ABNORMAL LOW (ref 8.4–10.5)
Chloride: 112 mEq/L (ref 96–112)
Creatinine, Ser: 1.09 mg/dL (ref 0.50–1.10)
GFR calc Af Amer: 62 mL/min — ABNORMAL LOW (ref >90)
GFR calc non Af Amer: 53 mL/min — ABNORMAL LOW (ref >90)
GLUCOSE: 99 mg/dL (ref 70–99)
POTASSIUM: 4.3 meq/L (ref 3.7–5.3)
Sodium: 144 mEq/L (ref 137–147)

## 2014-01-23 LAB — TYPE AND SCREEN
ABO/RH(D): A POS
Antibody Screen: NEGATIVE
UNIT DIVISION: 0
Unit division: 0
Unit division: 0

## 2014-01-23 MED ORDER — SODIUM CHLORIDE 0.9 % IJ SOLN
10.0000 mL | INTRAMUSCULAR | Status: DC | PRN
  Administered 2014-01-24 – 2014-01-27 (×3): 10 mL

## 2014-01-23 MED ORDER — SODIUM CHLORIDE 0.9 % IJ SOLN
10.0000 mL | Freq: Two times a day (BID) | INTRAMUSCULAR | Status: DC
  Administered 2014-01-24 – 2014-01-26 (×3): 10 mL via INTRAVENOUS

## 2014-01-23 NOTE — Progress Notes (Signed)
[facility] Progress Note [place] TEAM 1 - Stepdown/ICU TEAM   [name] [id number] DOB: 1951/04/19 DOA: 01/17/2014 PCP: Teressa Lower, MD  Brief narrative: [name] is a 63 y.o. female presenting on 01/17/2014 with  has a past medical history of Renal disorder; Subacute delirium (10/14/2013); Alzheimer's dementia; Toxic encephalopathy; and UTI who presented to [facility] with left foot pain and was found to have ischemia. Vascular surgery was consulted, recommended transfer to [facility] where she underwent an angiogram on 2/11.    Subjective: Again states she "feels bad". However, no crying or agitated as she was yesterday.   Assessment/Plan: Principal Problem:   Critical lower limb ischemia - management per vascular surgery - no intervention perfomed on 2/11 since there was extravasation of dye noted as pt was not able to lay still for the procedure - they recommend stent of left common iliac under general anaesthesia on Tuesday  Active Problems:   Acute blood loss anemia - during angiogram 2/11 - given 2 U PRBC on 2/11 and 1 U again 2/13 - H and H stable- will tx out of SDU  Thrombocytopenia - possible due to consumption- follow    ALZHEIMERS DISEASE - cont home meds  - increase Seroquel at bedtime - trail of Zyprexa during the day   Previous VRE UTI present on admission- Renal calculus with Double J stent in left kidney - last day of Zyvox today- 2/13 - nonspecific left sided peri-nephric stranding noted on CT along which changed consistent with cystitis - repeat urine culture negative although large leukocytes in urine - follow WBC count and temp off antibiotics  Acute renal failure - resolved with hydration.   History of C diff - - has  not recently been taking fidaxomycin as an outpatient. This was verified by pharmacy - Monitor for diarrhea  - Started florastor  Severe protein calorie malnutrition  - add TID ensure- per RN PO intake is extremely poor- per  family, she barely eats at home - adjusting psych meds to see if agitation can be controlled and subsequently will regain an appetite  IV access - has lost IV and not able to obtain another - Dr Bridgett Larsson able to obtain right axillary midline cath- Stenosis of axillary vein prevented placement of true PICC   Partial hemicolectomy with ileocolic anastamosis    Code Status: full code Family Communication: with husband and son at bedside on 2/13 Disposition Plan: tx to med/surg-note PT reccs 24 hr supervision at home vs SNF  Consultants: Vascular sx   Procedures: ABI  LE BL duplex  CT angio chest/abd/pelvis 2/10  Arteriogram 2/11  Antibiotics: linezolid 2/9 (started prior to 2/9) >>  DVT prophylaxis: SCDs  Objective: Filed Weights   01/17/14 2144 01/18/14 0113  Weight: 36.741 kg (81 lb) 36.74 kg (81 lb)   Blood pressure 169/58, pulse 92, temperature 98.4 F (36.9 C), temperature source Oral, resp. rate 20, height 5\' 2"  (1.575 m), weight 36.74 kg (81 lb), SpO2 100.00%.  Intake/Output Summary (Last 24 hours) at 01/23/14 1543 Last data filed at 01/23/14 1300  Gross per 24 hour  Intake    470 ml  Output   1700 ml  Net  -1230 ml     Exam: General: confused, less agitated but still distresses No acute respiratory distress Lungs: Clear to auscultation bilaterally without wheezes or crackles Cardiovascular: Regular rate and rhythm without murmur gallop or rub normal S1 and S2 Abdomen: Nontender, nondistended, soft, bowel sounds positive, no rebound, no  ascites, no appreciable mass Extremities: No significant clubbing, or edema bilateral lower extremities- bluish discoloration of left toes noted  Data Reviewed: Basic Metabolic Panel:  Recent Labs Lab 01/19/14 0243 01/20/14 0745 01/20/14 1425 01/21/14 0235 01/22/14 0440 01/23/14 0308  NA 144 143 143 142 142 144  K 4.7 4.7 4.3 4.8 4.8 4.3  CL 114* 113* 113* 113* 112 112  CO2 20 18*  --  14* 19 21  GLUCOSE 83 79 91  78 100* 99  BUN 12 8 7 14 16 13   CREATININE 1.43* 1.14* 1.10 1.21* 1.27* 1.09  CALCIUM 7.7* 8.1*  --  7.6* 8.0* 8.2*   Liver Function Tests: No results found for this basename: AST, ALT, ALKPHOS, BILITOT, PROT, ALBUMIN,  in the last 168 hours No results found for this basename: LIPASE, AMYLASE,  in the last 168 hours No results found for this basename: AMMONIA,  in the last 168 hours CBC:  Recent Labs Lab 01/17/14 1741  01/20/14 0745 01/20/14 1347 01/20/14 1425 01/21/14 0235 01/22/14 0440 01/23/14 0850  WBC 7.3  < > 5.6 8.3  --  7.6 6.2 7.2  NEUTROABS 3.8  --   --   --   --   --   --   --   HGB 8.9*  < > 9.1* 6.4* 6.8* 8.2* 7.7* 10.0*  HCT 27.5*  < > 28.9* 20.4* 20.0* 24.9* 23.0* 30.4*  MCV 85.1  < > 87.6 87.9  --  85.6 86.1 86.9  PLT 225  < > 195 229  --  138* 112* 119*  < > = values in this interval not displayed. Cardiac Enzymes: No results found for this basename: CKTOTAL, CKMB, CKMBINDEX, TROPONINI,  in the last 168 hours BNP (last 3 results) No results found for this basename: PROBNP,  in the last 8760 hours CBG: No results found for this basename: GLUCAP,  in the last 168 hours  Recent Results (from the past 240 hour(s))  URINE CULTURE     Status: None   Collection Time    01/20/14  8:24 AM      Result Value Ref Range Status   Specimen Description URINE, RANDOM   Final   Special Requests ADDED [id number]   Final   Culture  Setup Time     Final   Value: 01/21/2014 01:48     Performed at [facility]     Final   Value: NO GROWTH     Performed at [facility]   Culture     Final   Value: NO GROWTH     Performed at [facility]   Report Status 01/21/2014 FINAL   Final  MRSA PCR SCREENING     Status: None   Collection Time    01/20/14  3:38 PM      Result Value Ref Range Status   MRSA by PCR NEGATIVE  NEGATIVE Final   Comment:            The GeneXpert MRSA Assay (FDA     approved for NASAL specimens     only), is one  component of a     comprehensive MRSA colonization     surveillance program. It is not     intended to diagnose MRSA     infection nor to guide or     monitor treatment for     MRSA infections.     Studies:  Recent x-ray studies have been  reviewed in detail by the Attending Physician  Scheduled Meds:  Scheduled Meds: . amLODipine  5 mg Oral Daily  . [START ON 01/24/2014] cefUROXime (ZINACEF)  IV  1.5 g Intravenous On Call to OR  . clopidogrel  75 mg Oral Q breakfast  . docusate sodium  100 mg Oral Daily  . donepezil  10 mg Oral QHS  . enoxaparin (LOVENOX) injection  1 mg/kg Subcutaneous Q24H  . feeding supplement (ENSURE COMPLETE)  237 mL Oral BID BM  . feeding supplement (ENSURE)  1 Container Oral TID BM  . magnesium oxide  400 mg Oral Daily  . metoprolol tartrate  25 mg Oral BID  . OLANZapine  2.5 mg Oral Daily  . phenytoin  200 mg Oral QHS  . QUEtiapine  50 mg Oral QHS,MR X 1  . saccharomyces boulardii  250 mg Oral BID  . sodium chloride  10 mL Intravenous Q12H  . sodium chloride  3 mL Intravenous Q12H   Continuous Infusions: . sodium chloride Stopped (01/20/14 1934)    Time spent on care of this patient: 48 min   [place], MD  [facility] Office  [phone] Pager - Text Page per Shea Evans as per below:  On-Call/Text Page:      [email or web address]      password [email or web address]  If 7PM-7AM, please contact night-coverage [email or web address] Password [facility] 01/23/2014, 3:43 PM   LOS: 6 days

## 2014-01-23 NOTE — Progress Notes (Signed)
Patient transferred from another unit with foley catheter. Notified MD to clarify orders concerning D/C foley or keep it placed. Was told by MD to remove foley. Patient is schedule to have surgery first part of the week. Questioned MD about leaving the foley because of the upcoming surgery, she told me to remove foley, did not think that she would need it for the surgery. Foley removed. Will continue to monitor.

## 2014-01-23 NOTE — Evaluation (Addendum)
Physical Therapy Evaluation Patient Details Name: [name] MRN: [id number] DOB: 08-Jun-1951 Today's Date: 01/23/2014 Time: [id number] PT Time Calculation (min): 15 min  PT Assessment / Plan / Recommendation History of Present Illness  [name] is a 63 y.o. female presenting on 01/17/2014 with  has a past medical history of Renal disorder; Subacute delirium (10/14/2013); Alzheimer's dementia; Toxic encephalopathy; and UTI who presented to [facility] with left foot pain and was found to have ischemia.  Clinical Impression  Pt adm due to the above. Presents with decreased independence with functional mobility secondary to deficits indicated below (see PT problem list). Pt to benefit from skilled acute PT to address deficits and increase independence. Pt would benefit from SNF for post acute rehab secondary to decreased independence with mobility and decreased cognition. Pt is a high fall risk. After speaking with RN, she reported that family would more than likely refuse SNF. Pt will require 24/7 (A) upon acute D/C. Will assess need for AD upon next visit.     PT Assessment  Patient needs continued PT services    Follow Up Recommendations  Supervision/Assistance - 24 hour;Other (comment);SNF (if 24/7 (A) can be provided will recommend HHPT )    Does the patient have the potential to tolerate intense rehabilitation      Barriers to Discharge   uncertain as to amount of (A) family can provide; will  need 24/7 (A)    Equipment Recommendations  Rolling walker with 5" wheels    Recommendations for Other Services OT consult   Frequency Min 3X/week    Precautions / Restrictions Precautions Precautions: Fall Precaution Comments: pt with dementia  Restrictions Weight Bearing Restrictions: No   Pertinent Vitals/Pain 6/10 in Lt LE; patient repositioned for comfort       Mobility  Bed Mobility Overal bed mobility: Modified Independent Transfers Overall transfer level: Needs  assistance Equipment used: 2 person hand held assist Transfers: Sit to/from Stand Sit to Stand: Min assist;+2 safety/equipment General transfer comment: cues for hand placement and safety; pt impulsive with transfers; (A) to maintain balance  Ambulation/Gait Ambulation/Gait assistance: Min assist;+2 safety/equipment Ambulation Distance (Feet): 12 Feet Assistive device: 2 person hand held assist Gait Pattern/deviations: Decreased stance time - left;Decreased step length - right;Shuffle;Trunk flexed;Narrow base of support;Antalgic Gait velocity: decreased; due to pain Gait velocity interpretation: Below normal speed for age/gender General Gait Details: pt requires UE (A) to maintain balance; cues for upright posture and safety; pt unsteady due to pain in Lt foot with WB; decr safety awareness with ambulation; will attempt RW next visit          PT Diagnosis: Difficulty walking;Acute pain;Generalized weakness  PT Problem List: Decreased strength;Decreased activity tolerance;Decreased balance;Decreased mobility;Decreased cognition;Decreased knowledge of use of DME;Decreased safety awareness;Pain;Impaired sensation PT Treatment Interventions: DME instruction;Gait training;Functional mobility training;Therapeutic activities;Stair training;Therapeutic exercise;Balance training;Neuromuscular re-education;Patient/family education     PT Goals(Current goals can be found in the care plan section) Acute Rehab PT Goals Patient Stated Goal: for my family to come back PT Goal Formulation: With patient Time For Goal Achievement: 02/06/14 Potential to Achieve Goals: Fair  Visit Information  Last PT Received On: 01/23/14 Assistance Needed: +1 History of Present Illness: [name] is a 63 y.o. female presenting on 01/17/2014 with  has a past medical history of Renal disorder; Subacute delirium (10/14/2013); Alzheimer's dementia; Toxic encephalopathy; and UTI who presented to [facility] with left foot pain and  was found to have ischemia.       Prior  Functioning  Home Living Family/patient expects to be discharged to:: Private residence Living Arrangements: Children;Spouse/significant other Available Help at Discharge: Family;Available 24 hours/day Type of Home: House Home Equipment: Cane - single point Additional Comments: pt unable to give a full clear picture of home setup  Prior Function Level of Independence: Needs assistance Comments: pt unable to give clear picture of PLOF; pt stated she walked with cane Communication Communication: No difficulties Dominant Hand: Right    Cognition  Cognition Arousal/Alertness: Awake/alert Behavior During Therapy: Anxious;Restless Overall Cognitive Status: History of cognitive impairments - at baseline Memory: Decreased short-term memory    Extremity/Trunk Assessment Upper Extremity Assessment Upper Extremity Assessment: Defer to OT evaluation Lower Extremity Assessment Lower Extremity Assessment: Generalized weakness;LLE deficits/detail LLE: Unable to fully assess due to pain LLE Sensation: decreased light touch Cervical / Trunk Assessment Cervical / Trunk Assessment: Normal   Balance Balance Overall balance assessment: Needs assistance Sitting-balance support: Feet supported;Single extremity supported Sitting balance-Leahy Scale: Good Standing balance support: During functional activity;Bilateral upper extremity supported Standing balance-Leahy Scale: Poor Standing balance comment: UE supported by 2 person handheld (A)  End of Session PT - End of Session Equipment Utilized During Treatment: Gait belt Activity Tolerance: Patient tolerated treatment well Patient left: in bed;with bed alarm set;with call bell/phone within reach Nurse Communication: Mobility status;Precautions  GP     Gustavus Bryant, [place] 01/23/2014, 2:24 PM

## 2014-01-23 NOTE — Progress Notes (Signed)
Pt 63 years old female a transfer from 82 [address] with dx of Ischeanic left foot . Pt alert but confused to place time and place . Oriented to room and transfer to a chair at bed side  Cancer/o left foot pain rated 10/10 on pain scale . Dilaudid 2 mg iv given. Pt quiet comfortable in chair at present . Family at bedside. Rn will continue to monitor.

## 2014-01-23 NOTE — Procedures (Signed)
Procedure:  Right arm midline IV catheter Findings:  Right brachial vein accessed under [place].  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  5 Fr, DL cath cut to 12 cm and placed as midline to right axilla.  OK to use.

## 2014-01-24 LAB — URINALYSIS, ROUTINE W REFLEX MICROSCOPIC
Bilirubin Urine: NEGATIVE
GLUCOSE, UA: NEGATIVE mg/dL
Ketones, ur: NEGATIVE mg/dL
Nitrite: NEGATIVE
Protein, ur: NEGATIVE mg/dL
Specific Gravity, Urine: 1.013 (ref 1.005–1.030)
Urobilinogen, UA: 0.2 mg/dL (ref 0.0–1.0)
pH: 7.5 (ref 5.0–8.0)

## 2014-01-24 LAB — BASIC METABOLIC PANEL
BUN: 12 mg/dL (ref 6–23)
CALCIUM: 7.8 mg/dL — AB (ref 8.4–10.5)
CHLORIDE: 108 meq/L (ref 96–112)
CO2: 21 mEq/L (ref 19–32)
Creatinine, Ser: 1.07 mg/dL (ref 0.50–1.10)
GFR calc Af Amer: 63 mL/min — ABNORMAL LOW (ref >90)
GFR calc non Af Amer: 54 mL/min — ABNORMAL LOW (ref >90)
GLUCOSE: 131 mg/dL — AB (ref 70–99)
Potassium: 3.9 mEq/L (ref 3.7–5.3)
SODIUM: 142 meq/L (ref 137–147)

## 2014-01-24 LAB — URINE MICROSCOPIC-ADD ON

## 2014-01-24 LAB — OCCULT BLOOD X 1 CARD TO LAB, STOOL: FECAL OCCULT BLD: NEGATIVE

## 2014-01-24 MED ORDER — WHITE PETROLATUM GEL
Status: AC
  Administered 2014-01-24: 05:00:00
  Filled 2014-01-24: qty 5

## 2014-01-24 MED ORDER — HEPARIN (PORCINE) IN NACL 100-0.45 UNIT/ML-% IJ SOLN
950.0000 [IU]/h | INTRAMUSCULAR | Status: DC
  Administered 2014-01-24: 850 [IU]/h via INTRAVENOUS
  Administered 2014-01-25: 950 [IU]/h via INTRAVENOUS
  Filled 2014-01-24 (×3): qty 250

## 2014-01-24 NOTE — Progress Notes (Signed)
ANTICOAGULATION CONSULT NOTE - Follow Up Consult  Pharmacy Consult for Lovenox >> Heparin  Indication: thromboembolic event in LLE  Allergies  Allergen Reactions  . Codeine     REACTION: intolerance    Patient Measurements: Height: 5\' 2"  (157.5 cm) Weight: 81 lb (36.74 kg) IBW/kg (Calculated) : 50.1  Vital Signs: Temp: 99.5 F (37.5 C) (02/15 1320) Temp src: Oral (02/15 0658) BP: 151/66 mmHg (02/15 1320) Pulse Rate: 80 (02/15 1320)  Labs:  Recent Labs  01/22/14 0440 01/22/14 1445 01/22/14 2311 01/23/14 0308 01/23/14 0850 01/24/14 0445  HGB 7.7*  --   --   --  10.0*  --   HCT 23.0*  --   --   --  30.4*  --   PLT 112*  --   --   --  119*  --   HEPARINUNFRC  --  <0.10* 0.33  --   --   --   CREATININE 1.27*  --   --  1.09  --  1.07    Estimated Creatinine Clearance: 31.6 ml/min (by C-G formula based on Cr of 1.07).   Medications:  Scheduled:  . amLODipine  5 mg Oral Daily  . cefUROXime (ZINACEF)  IV  1.5 g Intravenous On Call to OR  . clopidogrel  75 mg Oral Q breakfast  . docusate sodium  100 mg Oral Daily  . donepezil  10 mg Oral QHS  . feeding supplement (ENSURE COMPLETE)  237 mL Oral BID BM  . feeding supplement (ENSURE)  1 Container Oral TID BM  . magnesium oxide  400 mg Oral Daily  . metoprolol tartrate  25 mg Oral BID  . OLANZapine  2.5 mg Oral Daily  . phenytoin  200 mg Oral QHS  . QUEtiapine  50 mg Oral QHS,MR X 1  . saccharomyces boulardii  250 mg Oral BID  . sodium chloride  10 mL Intravenous Q12H  . sodium chloride  3 mL Intravenous Q12H   Infusions:  . sodium chloride Stopped (01/20/14 1934)    Assessment: 63 year old female on therapeutic Lovenox for left foot thromboembolic event with possible chronic PVD. She was on heparin prior to Lovenox but lost IV access. Pt now has IV midline access and plan is to switch back to heparin with plans for a stent of left common iliac on Tuesday. Pt is s/p 2 U PRBC for acute blood loss anemia. H/H  improved to 10/30.4. Plt 119 (chronic thrombocytopenia).   Goal of Therapy:  Heparin level 0.3-0.7 units/ml Monitor platelets by anticoagulation protocol: Yes   Plan:   -Stop Lovenox  -Restart heparin at previous infusion rate of 850 units/hr -HL at midnight  -Daily HL/CBC -Monitor for s/s of bleeding   Albertina Parr, PharmD.  Clinical Pharmacist Pager [phone]

## 2014-01-24 NOTE — Progress Notes (Signed)
Clinical Social Work Department BRIEF PSYCHOSOCIAL ASSESSMENT 01/24/2014  Patient:  [name], [name]     Account Number:  [id number]     Admit date:  01/17/2014  Clinical Social Worker:  Rolinda Roan  Date/Time:  01/24/2014 07:13 PM  Referred by:  Physician  Date Referred:  01/23/2014 Referred for  SNF Placement   Other Referral:   Interview type:  Family Other interview type:    PSYCHOSOCIAL DATA Living Status:  HUSBAND Admitted from facility:   Level of care:   Primary support name:  Elaiya Nosal [phone] Primary support relationship to patient:  SPOUSE Degree of support available:   good support, completed assessment on the phone with CSW.    CURRENT CONCERNS  Other Concerns:    SOCIAL WORK ASSESSMENT / PLAN Clinical Social Worker (CSW) contacted patient's husband because per chart patient is not alert and oriented. Husband reported that he wants [facility] and rehab. CSW sent SNF referral to [facility].   Assessment/plan status:  Psychosocial Support/Ongoing Assessment of Needs Other assessment/ plan:   Information/referral to community resources:    PATIENT'S/FAMILY'S RESPONSE TO PLAN OF CARE: Husband thanked CSW for calling. Husband reported that he has had [name] great experience at [facility] and is impressed with the care his wife has received.

## 2014-01-24 NOTE — Progress Notes (Addendum)
[facility] Progress Note   [name] [id number] DOB: [birth year] DOA: 01/17/2014 PCP: Teressa Lower, MD  Brief narrative: [name] is a 63 y.o. female presenting on 01/17/2014 with  has a past medical history of Renal disorder; Subacute delirium (10/14/2013); Alzheimer's dementia; Toxic encephalopathy; and UTI who presented to [facility] with left foot pain and was found to have ischemia. Vascular surgery was consulted, recommended transfer to [facility] where she underwent an angiogram on 2/11.    Subjective: Again states she "feels bad". However, no crying or agitated as she was yesterday.   Assessment/Plan:    Critical lower limb ischemia - management per vascular surgery - no intervention perfomed on 2/11 since there was extravasation of dye noted as pt was not able to lay still for the procedure - they recommend stent of left common iliac under general anaesthesia on Tuesday  Active Problems:   Acute blood loss anemia - during angiogram 2/11 - given 2 U PRBC on 2/11 and 1 U again 2/13 - H and H stable.  Thrombocytopenia - possible due to consumption- follow -Recheck CBC in am.    ALZHEIMERS DISEASE - cont home meds  - increase Seroquel at bedtime - trail of Zyprexa during the day   Previous VRE UTI present on admission- Renal calculus with Double J stent in left kidney - last day of Zyvox - 2/13 - nonspecific left sided peri-nephric stranding noted on CT along which changed consistent with cystitis - repeat urine culture negative although large leukocytes in urine - follow WBC count and temp off antibiotics  Acute renal failure - resolved with hydration.   History of C diff - - has  not recently been taking fidaxomycin as an outpatient. 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 rate 18, height 5\' 2"  (1.575 m), weight 36.74 kg (81 lb), SpO2 100.00%.  Intake/Output Summary (Last 24 hours) at 01/24/14 1255 Last data filed at 01/24/14 0800  Gross per 24 hour  Intake    760 ml  Output    300 ml  Net    460 ml     Exam: General: confused, less agitated but still distresses No acute respiratory distress Lungs: Clear to auscultation bilaterally without wheezes or crackles Cardiovascular: Regular rate and rhythm without murmur gallop or rub normal S1 and S2 Abdomen: Nontender, nondistended, soft, bowel sounds positive, no rebound, no ascites, no appreciable mass Extremities: No  significant clubbing, or edema bilateral lower extremities- bluish discoloration of left toes noted  Data Reviewed: Basic Metabolic Panel:  Recent Labs Lab 01/20/14 0745 01/20/14 1425 01/21/14 0235 01/22/14 0440 01/23/14 0308 01/24/14 0445  NA 143 143 142 142 144 142  K 4.7 4.3 4.8 4.8 4.3 3.9  CL 113* 113* 113* 112 112 108  CO2 18*  --  14* 19 21 21   GLUCOSE 79 91 78 100* 99 131*  BUN 8 7 14 16 13 12   CREATININE 1.14* 1.10 1.21*  1.27* 1.09 1.07  CALCIUM 8.1*  --  7.6* 8.0* 8.2* 7.8*   Liver Function Tests: No results found for this basename: AST, ALT, ALKPHOS, BILITOT, PROT, ALBUMIN,  in the last 168 hours No results found for this basename: LIPASE, AMYLASE,  in the last 168 hours No results found for this basename: AMMONIA,  in the last 168 hours CBC:  Recent Labs Lab 01/17/14 1741  01/20/14 0745 01/20/14 1347 01/20/14 1425 01/21/14 0235 01/22/14 0440 01/23/14 0850  WBC 7.3  < > 5.6 8.3  --  7.6 6.2 7.2  NEUTROABS 3.8  --   --   --   --   --   --   --   HGB 8.9*  < > 9.1* 6.4* 6.8* 8.2* 7.7* 10.0*  HCT 27.5*  < > 28.9* 20.4* 20.0* 24.9* 23.0* 30.4*  MCV 85.1  < > 87.6 87.9  --  85.6 86.1 86.9  PLT 225  < > 195 229  --  138* 112* 119*  < > = values in this interval not displayed. Cardiac Enzymes: No results found for this basename: CKTOTAL, CKMB, CKMBINDEX, TROPONINI,  in the last 168 hours BNP (last 3 results) No results found for this basename: PROBNP,  in the last 8760 hours CBG: No results found for this basename: GLUCAP,  in the last 168 hours  Recent Results (from the past 240 hour(s))  URINE CULTURE     Status: None   Collection Time    01/20/14  8:24 AM      Result Value Ref Range Status   Specimen Description URINE, RANDOM   Final   Special Requests ADDED [id number]   Final   Culture  Setup Time     Final   Value: 01/21/2014 01:48     Performed at [place]     Final   Value: NO GROWTH     Performed at [facility]   Culture     Final   Value: NO GROWTH     Performed at [facility]   Report Status 01/21/2014 FINAL   Final  MRSA PCR SCREENING     Status: None   Collection Time    01/20/14  3:38 PM      Result Value Ref Range Status   MRSA by PCR NEGATIVE  NEGATIVE Final   Comment:            The GeneXpert MRSA Assay (FDA     approved for NASAL specimens     only), is one component of a     comprehensive MRSA colonization      surveillance program. It is not     intended to diagnose MRSA     infection nor to guide or     monitor treatment for     MRSA infections.     Studies:  Recent x-ray studies have been reviewed in detail by the Attending  Physician  Scheduled Meds:  Scheduled Meds: . amLODipine  5 mg Oral Daily  . cefUROXime (ZINACEF)  IV  1.5 g Intravenous On Call to OR  . clopidogrel  75 mg Oral Q breakfast  . docusate sodium  100 mg Oral Daily  . donepezil  10 mg Oral QHS  . enoxaparin (LOVENOX) injection  1 mg/kg Subcutaneous Q24H  . feeding supplement (ENSURE COMPLETE)  237 mL Oral BID BM  . feeding supplement (ENSURE)  1 Container Oral TID BM  . magnesium oxide  400 mg Oral Daily  . metoprolol tartrate  25 mg Oral BID  . OLANZapine  2.5 mg Oral Daily  . phenytoin  200 mg Oral QHS  . QUEtiapine  50 mg Oral QHS,MR X 1  . saccharomyces boulardii  250 mg Oral BID  . sodium chloride  10 mL Intravenous Q12H  . sodium chloride  3 mL Intravenous Q12H   Continuous Infusions: . sodium chloride Stopped (01/20/14 1934)    Time spent on care of this patient: 25 min   Lelon Frohlich, MD [facility] Office  [phone] Pager: [phone]   If 7PM-7AM, please contact night-coverage [email or web address] Password [facility] 01/24/2014, 12:55 PM   LOS: 7 days

## 2014-01-24 NOTE — Progress Notes (Addendum)
01/23/2014, per MD Rizwan, foley D/Cd at 1740.

## 2014-01-24 NOTE — Progress Notes (Signed)
After multiple attempts pt unable to void foley dcd 1740 on 01/23/14 scanned >587ml in/out cathed 600 ml u/a c&s sent due to uti symptoms of cloudy,malodorous,with sediment.[facility] notofoed at [phone] am.Pt remains very confused,but is cooperative.L foot pain relieved with prns given.Aggie Moats D

## 2014-01-24 NOTE — Progress Notes (Signed)
Clinical Social Work Department CLINICAL SOCIAL WORK PLACEMENT NOTE 01/24/2014  Patient:  [name], [name]  Account Number:  [id number] Admit date:  01/17/2014  Clinical Social Worker:  Tilden Fossa, Latanya Presser  Date/time:  01/24/2014 06:33 PM  Clinical Social Work is seeking post-discharge placement for this patient at the following level of care:   SKILLED NURSING   (*CSW will update this form in Epic as items are completed)   01/24/2014  Patient/family provided with [place] Department of Clinical Social Work's list of facilities offering this level of care within the geographic area requested by the patient (or if unable, by the patient's family).  01/24/2014  Patient/family informed of their freedom to choose among providers that offer the needed level of care, that participate in Medicare, Medicaid or managed care program needed by the patient, have an available bed and are willing to accept the patient.  01/24/2014  Patient/family informed of [facility]' ownership interest in [facility], as well as of the fact that they are under no obligation to receive care at this facility.  PASARR submitted to EDS on 04/21/2013 PASARR number received from EDS on 04/21/2013  FL2 transmitted to all facilities in geographic area requested by pt/family on  01/24/2014 FL2 transmitted to all facilities within larger geographic area on   Patient informed that his/her managed care company has contracts with or will negotiate with  certain facilities, including the following:     Patient/family informed of bed offers received:   Patient chooses bed at  Physician recommends and patient chooses bed at    Patient to be transferred to  on   Patient to be transferred to facility by   The following physician request were entered in Epic:   Additional Comments:  Tilden Fossa, MSW, [place] Worker [facility] Dept. [phone]

## 2014-01-24 NOTE — Progress Notes (Addendum)
Foley d/c'd at 1740, 01/23/2014 per Dr. Wynelle Cleveland verbal order. Per report from RN, In and out cath performed during 2nd shift 600 ml of urine output at 0415 2/15. Patient's family very upset with staff upon arrival today d/t foley removal.  Per family, "Foley placed by urologist previous to admission of hospital d/t preexisting bladder issues/condition".  Notified Dr. Jerilee Hoh re; situation, per verbal order, foley placed. Will continue to monitor.

## 2014-01-25 ENCOUNTER — Encounter (HOSPITAL_COMMUNITY): Payer: Medicare Other

## 2014-01-25 ENCOUNTER — Encounter (HOSPITAL_COMMUNITY): Payer: Self-pay | Admitting: Certified Registered Nurse Anesthetist

## 2014-01-25 ENCOUNTER — Inpatient Hospital Stay (HOSPITAL_COMMUNITY): Payer: Medicare Other

## 2014-01-25 ENCOUNTER — Encounter: Payer: Medicare Other | Admitting: Surgery

## 2014-01-25 LAB — CBC
HEMATOCRIT: 25.8 % — AB (ref 36.0–46.0)
HEMOGLOBIN: 8.3 g/dL — AB (ref 12.0–15.0)
MCH: 28.4 pg (ref 26.0–34.0)
MCHC: 32.2 g/dL (ref 30.0–36.0)
MCV: 88.4 fL (ref 78.0–100.0)
Platelets: 90 10*3/uL — ABNORMAL LOW (ref 150–400)
RBC: 2.92 MIL/uL — ABNORMAL LOW (ref 3.87–5.11)
RDW: 16.1 % — ABNORMAL HIGH (ref 11.5–15.5)
WBC: 7.3 10*3/uL (ref 4.0–10.5)

## 2014-01-25 LAB — BASIC METABOLIC PANEL
BUN: 13 mg/dL (ref 6–23)
CHLORIDE: 104 meq/L (ref 96–112)
CO2: 23 meq/L (ref 19–32)
Calcium: 7.9 mg/dL — ABNORMAL LOW (ref 8.4–10.5)
Creatinine, Ser: 1.04 mg/dL (ref 0.50–1.10)
GFR calc Af Amer: 65 mL/min — ABNORMAL LOW (ref >90)
GFR calc non Af Amer: 56 mL/min — ABNORMAL LOW (ref >90)
Glucose, Bld: 106 mg/dL — ABNORMAL HIGH (ref 70–99)
POTASSIUM: 4.1 meq/L (ref 3.7–5.3)
Sodium: 138 mEq/L (ref 137–147)

## 2014-01-25 LAB — TYPE AND SCREEN
ABO/RH(D): A POS
Antibody Screen: NEGATIVE

## 2014-01-25 LAB — HEPARIN LEVEL (UNFRACTIONATED)
HEPARIN UNFRACTIONATED: 0.21 [IU]/mL — AB (ref 0.30–0.70)
Heparin Unfractionated: 0.25 IU/mL — ABNORMAL LOW (ref 0.30–0.70)

## 2014-01-25 LAB — PROTIME-INR
INR: 1.04 (ref 0.00–1.49)
Prothrombin Time: 13.4 seconds (ref 11.6–15.2)

## 2014-01-25 MED ORDER — HYDROMORPHONE HCL PF 1 MG/ML IJ SOLN
1.0000 mg | INTRAMUSCULAR | Status: DC | PRN
  Administered 2014-01-25: 2 mg via INTRAVENOUS
  Administered 2014-01-25 – 2014-01-26 (×2): 1 mg via INTRAVENOUS
  Administered 2014-01-27: 2 mg via INTRAVENOUS
  Administered 2014-01-27: 1 mg via INTRAVENOUS
  Filled 2014-01-25: qty 1
  Filled 2014-01-25 (×2): qty 2
  Filled 2014-01-25: qty 1
  Filled 2014-01-25: qty 2

## 2014-01-25 NOTE — Progress Notes (Signed)
   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  Consent: to be obtained  Laboratory:  CBC:    Component Value Date/Time   WBC 7.3 01/25/2014 0115   RBC 2.92* 01/25/2014 0115   HGB 8.3* 01/25/2014 0115   HCT 25.8* 01/25/2014 0115   PLT 90* 01/25/2014 0115   MCV 88.4 01/25/2014 0115   MCH 28.4 01/25/2014 0115   MCHC 32.2 01/25/2014 0115   RDW 16.1* 01/25/2014 0115   LYMPHSABS 2.7 01/17/2014 1741   MONOABS 0.6 01/17/2014 1741   EOSABS 0.2 01/17/2014 1741   BASOSABS 0.0 01/17/2014 1741    BMP:    Component Value Date/Time   NA 138 01/25/2014 0115   K 4.1 01/25/2014 0115   CL 104 01/25/2014 0115   CO2 23 01/25/2014 0115   GLUCOSE 106* 01/25/2014 0115   BUN 13 01/25/2014 0115   CREATININE 1.04 01/25/2014 0115   CALCIUM 7.9* 01/25/2014 0115   GFRNONAA 56* 01/25/2014 0115   GFRAA 65* 01/25/2014 0115    Coagulation: Lab Results  Component Value Date   INR 1.04 01/25/2014   INR 1.01 01/17/2014   INR 1.0 10/10/2007   No results found for this basename: PTT   EKG: 03/20/13 no acute changes  CXR: to be obtained today  [name], MD [facility] Office: [phone] Pager: [phone]  01/25/2014, 9:47 AM

## 2014-01-25 NOTE — Progress Notes (Signed)
ANTICOAGULATION CONSULT NOTE - Follow Up Consult  Pharmacy Consult for heparin Indication: ischemic foot  Labs:  Recent Labs  01/22/14 2311 01/23/14 0308 01/23/14 0850 01/24/14 0445 01/25/14 0115 01/25/14 1620  HGB  --   --  10.0*  --  8.3*  --   HCT  --   --  30.4*  --  25.8*  --   PLT  --   --  119*  --  90*  --   LABPROT  --   --   --   --  13.4  --   INR  --   --   --   --  1.04  --   HEPARINUNFRC 0.33  --   --   --  0.21* 0.25*  CREATININE  --  1.09  --  1.07 1.04  --     Assessment: 62yo female slightly subtherapeutic on heparin, HL 0.25.  Patient's hemoglobin is stable at 8.3, platelets have been slowly declining from 225,000 on 01/17/14 to 90,000 today.  Will continue to monitor  Goal of Therapy:  Heparin level 0.3-0.7 units/ml   Plan:  Will increase heparin gtt slightly to 950 units/hr and check level in 6 hours Daily HL/CBC  Hughes Better, PharmD, BCPS  01/25/2014,5:28 PM

## 2014-01-25 NOTE — Progress Notes (Signed)
ANTICOAGULATION CONSULT NOTE - Follow Up Consult  Pharmacy Consult for heparin Indication: ischemic foot  Labs:  Recent Labs  01/22/14 0440 01/22/14 1445 01/22/14 2311 01/23/14 0308 01/23/14 0850 01/24/14 0445 01/25/14 0115  HGB 7.7*  --   --   --  10.0*  --  8.3*  HCT 23.0*  --   --   --  30.4*  --  25.8*  PLT 112*  --   --   --  119*  --  PENDING  LABPROT  --   --   --   --   --   --  13.4  INR  --   --   --   --   --   --  1.04  HEPARINUNFRC  --  <0.10* 0.33  --   --   --  0.21*  CREATININE 1.27*  --   --  1.09  --  1.07 1.04    Assessment: 62yo female subtherapeutic on heparin after resumed; may need more time to accumulate though was at low end of goal at current rate with room to move.  Goal of Therapy:  Heparin level 0.3-0.7 units/ml   Plan:  Will increase heparin gtt slightly to 900 units/hr and check level in [place], PharmD, BCPS  01/25/2014,2:21 AM

## 2014-01-25 NOTE — Progress Notes (Signed)
RN paged stating pt having surgery in am and is on Heparin gtt and is inquiring as to stopping the Heparin gtt preop. Vascular surgery is doing procedure in am. I asked the RN to page vascular surgery and ask them when to stop the gtt. Clance Boll, NP [facility]

## 2014-01-25 NOTE — Progress Notes (Signed)
[facility] Progress Note   [name] LICURSI [id number] DOB: 11-15-1951 DOA: 01/17/2014 PCP: Teressa Lower, MD  Brief narrative: [name] is a 63 y.o. female presenting on 01/17/2014 with  has a past medical history of Renal disorder; Subacute delirium (10/14/2013); Alzheimer's dementia; Toxic encephalopathy; and UTI who presented to [facility] with left foot pain and was found to have ischemia. Vascular surgery was consulted, recommended transfer to [facility] where she underwent an angiogram on 2/11.    Subjective: Complains of significant pain to her left foot.  Assessment/Plan:    Critical lower limb ischemia - management per vascular surgery - no intervention perfomed on 2/11 since there was extravasation of dye noted as pt was not able to lay still for the procedure - they recommend stent of left common iliac under general anaesthesia on Tuesday 2/17.    Acute blood loss anemia - during angiogram 2/11 - given 2 U PRBC on 2/11 and 1 U again 2/13 - H and H stable.  Thrombocytopenia - possible due to consumption- follow -Recheck CBC in am.    ALZHEIMERS DISEASE - cont home meds  - increase Seroquel at bedtime - trail of Zyprexa during the day   Previous VRE UTI present on admission- Renal calculus with Double J stent in left kidney - last day of Zyvox - 2/13 - nonspecific left sided peri-nephric stranding noted on CT along which changed consistent with cystitis - repeat urine culture negative although large leukocytes in urine - follow WBC count and temp off antibiotics  Acute renal failure - resolved with hydration.   History of C diff - - has  not recently been taking fidaxomycin as an outpatient. This was verified by pharmacy - Monitor for diarrhea  - Started florastor  Severe protein calorie malnutrition  - add TID ensure- per RN PO intake is extremely poor- per family, she barely eats at home - adjusting psych meds to see if agitation can be controlled and subsequently  will regain an appetite   Partial hemicolectomy with ileocolic anastamosis    Code Status: full code Family Communication: with husband and son at bedside on 2/13; none today Disposition Plan: SNF once medically ready.  Consultants: Vascular sx   Procedures: ABI  LE BL duplex  CT angio chest/abd/pelvis 2/10  Arteriogram 2/11  Antibiotics: linezolid 2/9 (started prior to 2/9) >>  DVT prophylaxis: SCDs  Objective: Filed Weights   01/17/14 2144 01/18/14 0113  Weight: 36.741 kg (81 lb) 36.74 kg (81 lb)   Blood pressure 148/66, pulse 95, temperature 99.4 F (37.4 C), temperature source Oral, resp. rate 18, height 5\' 2"  (1.575 m), weight 36.74 kg (81 lb), SpO2 100.00%.  Intake/Output Summary (Last 24 hours) at 01/25/14 1343 Last data filed at 01/25/14 1228  Gross per 24 hour  Intake   1200 ml  Output   1475 ml  Net   -275 ml     Exam: General: confused, less agitated but still distresses No acute respiratory distress Lungs: Clear to auscultation bilaterally without wheezes or crackles Cardiovascular: Regular rate and rhythm without murmur gallop or rub normal S1 and S2 Abdomen: Nontender, nondistended, soft, bowel sounds positive, no rebound, no ascites, no appreciable mass Extremities: No significant clubbing, or edema bilateral lower extremities-blakish discoloration of left toes noted  Data Reviewed: Basic Metabolic Panel:  Recent Labs Lab 01/21/14 0235 01/22/14 0440 01/23/14 0308 01/24/14 0445 01/25/14 0115  NA 142 142 144 142 138  K 4.8 4.8 4.3 3.9 4.1  CL 113*  112 112 108 104  CO2 14* 19 21 21 23   GLUCOSE 78 100* 99 131* 106*  BUN 14 16 13 12 13   CREATININE 1.21* 1.27* 1.09 1.07 1.04  CALCIUM 7.6* 8.0* 8.2* 7.8* 7.9*   Liver Function Tests: No results found for this basename: AST, ALT, ALKPHOS, BILITOT, PROT, ALBUMIN,  in the last 168 hours No results found for this basename: LIPASE, AMYLASE,  in the last 168 hours No results found for this  basename: AMMONIA,  in the last 168 hours CBC:  Recent Labs Lab 01/20/14 1347 01/20/14 1425 01/21/14 0235 01/22/14 0440 01/23/14 0850 01/25/14 0115  WBC 8.3  --  7.6 6.2 7.2 7.3  HGB 6.4* 6.8* 8.2* 7.7* 10.0* 8.3*  HCT 20.4* 20.0* 24.9* 23.0* 30.4* 25.8*  MCV 87.9  --  85.6 86.1 86.9 88.4  PLT 229  --  138* 112* 119* 90*   Cardiac Enzymes: No results found for this basename: CKTOTAL, CKMB, CKMBINDEX, TROPONINI,  in the last 168 hours BNP (last 3 results) No results found for this basename: PROBNP,  in the last 8760 hours CBG: No results found for this basename: GLUCAP,  in the last 168 hours  Recent Results (from the past 240 hour(s))  URINE CULTURE     Status: None   Collection Time    01/20/14  8:24 AM      Result Value Ref Range Status   Specimen Description URINE, RANDOM   Final   Special Requests ADDED [id number]   Final   Culture  Setup Time     Final   Value: 01/21/2014 01:48     Performed at [place]     Final   Value: NO GROWTH     Performed at [facility]   Culture     Final   Value: NO GROWTH     Performed at [facility]   Report Status 01/21/2014 FINAL   Final  MRSA PCR SCREENING     Status: None   Collection Time    01/20/14  3:38 PM      Result Value Ref Range Status   MRSA by PCR NEGATIVE  NEGATIVE Final   Comment:            The GeneXpert MRSA Assay (FDA     approved for NASAL specimens     only), is one component of a     comprehensive MRSA colonization     surveillance program. It is not     intended to diagnose MRSA     infection nor to guide or     monitor treatment for     MRSA infections.     Scheduled Meds:  Scheduled Meds: . amLODipine  5 mg Oral Daily  . clopidogrel  75 mg Oral Q breakfast  . docusate sodium  100 mg Oral Daily  . donepezil  10 mg Oral QHS  . feeding supplement (ENSURE COMPLETE)  237 mL Oral BID BM  . feeding supplement (ENSURE)  1 Container Oral TID BM  .  magnesium oxide  400 mg Oral Daily  . metoprolol tartrate  25 mg Oral BID  . OLANZapine  2.5 mg Oral Daily  . phenytoin  200 mg Oral QHS  . QUEtiapine  50 mg Oral QHS,MR X 1  . saccharomyces boulardii  250 mg Oral BID  . sodium chloride  10 mL Intravenous Q12H  . sodium chloride  3 mL Intravenous Q12H  Continuous Infusions: . sodium chloride Stopped (01/20/14 1934)  . heparin 850 Units/hr (01/24/14 1654)    Time spent on care of this patient: 25 min   Lelon Frohlich, MD [facility] Office  [phone] Pager: [phone]   If 7PM-7AM, please contact night-coverage [email or web address] Password [email or web address] 01/25/2014, 1:43 PM   LOS: 8 days

## 2014-01-25 NOTE — Progress Notes (Signed)
NUTRITION FOLLOW-UP  DOCUMENTATION CODES Per approved criteria  -Severe malnutrition in the context of chronic illness -Underweight   INTERVENTION: Continue Ensure Complete po BID, each supplement provides 350 kcal and 13 grams of protein.  Continue Ensure Pudding po TID, each supplement provides 170 kcal and 4 grams of protein  Encourage PO intake  NUTRITION DIAGNOSIS: Inadequate oral intake now related to limited appetite and AMS AEB variable oral intake.  Goal: Pt to meet >/= 90% of their estimated nutrition needs   Monitor:  PO & supplemental intake, weight, labs, I/O's  ASSESSMENT: Patient with PMH of renal disorder, dementia and UTI; presented to the ED with L foot pain.  Work-up reveals thrombembolic activity to L foot.  Underwent aortogram on 2/11 for ischemic L foot. Plan for stent of left common iliac artery 217.  Last day of Zyvox 213. No need to change hospitalized diet as it is low in tyramine  Diet advanced to Regular on 12/12. Pt is consuming 0 - 50% of meals. Ensure Complete BID Ensure Pudding TID. Per MD, adjusting psych meds to potentially improve appetite.   Patient meets criteria for severe malnutrition in the context of chronic illness as evidenced by < 75% intake of estimated energy requirement for > 1 month, severe muscle loss and severe subcutaneous fat loss.    Height: Ht Readings from Last 1 Encounters:  01/18/14 5\' 2"  (1.575 m)    Weight: Wt Readings from Last 1 Encounters:  01/18/14 81 lb (36.74 kg)  Admit wt 81 lb - stable  BMI:  Body mass index is 14.81 kg/(m^2). Underweight  Estimated Nutritional Needs: Kcal: 1300-1500 Protein: 65-75 gm Fluid: >/= 1.5 L  Skin: L groin incision  Diet Order: General Meal Completion: 0-50%  EDUCATION NEEDS: -No education needs identified at this time   Intake/Output Summary (Last 24 hours) at 01/25/14 1451 Last data filed at 01/25/14 1228  Gross per 24 hour  Intake   1200 ml  Output    1475 ml  Net   -275 ml    Last BM: 2/15  Labs:   Recent Labs Lab 01/23/14 0308 01/24/14 0445 01/25/14 0115  NA 144 142 138  K 4.3 3.9 4.1  CL 112 108 104  CO2 21 21 23   BUN 13 12 13   CREATININE 1.09 1.07 1.04  CALCIUM 8.2* 7.8* 7.9*  GLUCOSE 99 131* 106*    Scheduled Meds: . amLODipine  5 mg Oral Daily  . clopidogrel  75 mg Oral Q breakfast  . docusate sodium  100 mg Oral Daily  . donepezil  10 mg Oral QHS  . feeding supplement (ENSURE COMPLETE)  237 mL Oral BID BM  . feeding supplement (ENSURE)  1 Container Oral TID BM  . magnesium oxide  400 mg Oral Daily  . metoprolol tartrate  25 mg Oral BID  . OLANZapine  2.5 mg Oral Daily  . phenytoin  200 mg Oral QHS  . QUEtiapine  50 mg Oral QHS,MR X 1  . saccharomyces boulardii  250 mg Oral BID  . sodium chloride  10 mL Intravenous Q12H  . sodium chloride  3 mL Intravenous Q12H    Continuous Infusions: . sodium chloride Stopped (01/20/14 1934)  . heparin 850 Units/hr (01/24/14 1654)    [place], [place], [place] Pager [phone] After Hours Pager

## 2014-01-25 NOTE — Progress Notes (Signed)
CSW (Clinical Education officer, museum) continues to follow. CSW received call from insurance asking if pt was going to be discharged today. CSW updated representative and informed that CSW will continue to send updated clinicals and inform on when pt is ready for dc. CSW spoke with RN CM about case as well.  [place], [place]

## 2014-01-26 ENCOUNTER — Encounter (HOSPITAL_COMMUNITY): Payer: Medicare Other | Admitting: Anesthesiology

## 2014-01-26 ENCOUNTER — Inpatient Hospital Stay (HOSPITAL_COMMUNITY): Payer: Medicare Other | Admitting: Anesthesiology

## 2014-01-26 ENCOUNTER — Encounter (HOSPITAL_COMMUNITY): Payer: Self-pay | Admitting: Anesthesiology

## 2014-01-26 ENCOUNTER — Encounter (HOSPITAL_COMMUNITY)
Admission: EM | Disposition: A | Discharge status: Home Health | Payer: Self-pay | Source: Home / Self Care | Attending: Internal Medicine

## 2014-01-26 DIAGNOSIS — I70219 Atherosclerosis of native arteries of extremities with intermittent claudication, unspecified extremity: Secondary | ICD-10-CM

## 2014-01-26 HISTORY — PX: INSERTION OF ILIAC STENT: SHX6256

## 2014-01-26 LAB — HEPARIN LEVEL (UNFRACTIONATED): HEPARIN UNFRACTIONATED: 0.2 [IU]/mL — AB (ref 0.30–0.70)

## 2014-01-26 LAB — BASIC METABOLIC PANEL
BUN: 10 mg/dL (ref 6–23)
CHLORIDE: 104 meq/L (ref 96–112)
CO2: 24 meq/L (ref 19–32)
CREATININE: 1.09 mg/dL (ref 0.50–1.10)
Calcium: 7.8 mg/dL — ABNORMAL LOW (ref 8.4–10.5)
GFR calc Af Amer: 62 mL/min — ABNORMAL LOW (ref >90)
GFR calc non Af Amer: 53 mL/min — ABNORMAL LOW (ref >90)
Glucose, Bld: 100 mg/dL — ABNORMAL HIGH (ref 70–99)
POTASSIUM: 4.1 meq/L (ref 3.7–5.3)
Sodium: 137 mEq/L (ref 137–147)

## 2014-01-26 LAB — CBC
HEMATOCRIT: 23.5 % — AB (ref 36.0–46.0)
Hemoglobin: 7.5 g/dL — ABNORMAL LOW (ref 12.0–15.0)
MCH: 28.3 pg (ref 26.0–34.0)
MCHC: 31.9 g/dL (ref 30.0–36.0)
MCV: 88.7 fL (ref 78.0–100.0)
Platelets: 75 10*3/uL — ABNORMAL LOW (ref 150–400)
RBC: 2.65 MIL/uL — AB (ref 3.87–5.11)
RDW: 16.2 % — ABNORMAL HIGH (ref 11.5–15.5)
WBC: 8.8 10*3/uL (ref 4.0–10.5)

## 2014-01-26 SURGERY — INSERTION, STENT, ARTERY, ILIAC
Anesthesia: General | Laterality: Left

## 2014-01-26 MED ORDER — PHENYLEPHRINE HCL 10 MG/ML IJ SOLN
INTRAMUSCULAR | Status: DC | PRN
  Administered 2014-01-26: 80 ug via INTRAVENOUS
  Administered 2014-01-26 (×3): 40 ug via INTRAVENOUS

## 2014-01-26 MED ORDER — ONDANSETRON HCL 4 MG/2ML IJ SOLN
INTRAMUSCULAR | Status: DC | PRN
  Administered 2014-01-26: 4 mg via INTRAVENOUS

## 2014-01-26 MED ORDER — LACTATED RINGERS IV SOLN
INTRAVENOUS | Status: DC | PRN
  Administered 2014-01-26: 07:00:00 via INTRAVENOUS

## 2014-01-26 MED ORDER — LIDOCAINE HCL (CARDIAC) 20 MG/ML IV SOLN
INTRAVENOUS | Status: AC
  Filled 2014-01-26: qty 5

## 2014-01-26 MED ORDER — ONDANSETRON HCL 4 MG/2ML IJ SOLN
INTRAMUSCULAR | Status: AC
  Filled 2014-01-26: qty 2

## 2014-01-26 MED ORDER — EPHEDRINE SULFATE 50 MG/ML IJ SOLN
INTRAMUSCULAR | Status: AC
  Filled 2014-01-26: qty 1

## 2014-01-26 MED ORDER — MIDAZOLAM HCL 2 MG/2ML IJ SOLN
INTRAMUSCULAR | Status: AC
  Filled 2014-01-26: qty 2

## 2014-01-26 MED ORDER — FENTANYL CITRATE 0.05 MG/ML IJ SOLN
INTRAMUSCULAR | Status: AC
  Filled 2014-01-26: qty 5

## 2014-01-26 MED ORDER — IODIXANOL 320 MG/ML IV SOLN
INTRAVENOUS | Status: DC | PRN
  Administered 2014-01-26: 150 mL via INTRAVENOUS

## 2014-01-26 MED ORDER — DEXTROSE 5 % IV SOLN
10.0000 mg | INTRAVENOUS | Status: DC | PRN
  Administered 2014-01-26: 25 ug/min via INTRAVENOUS

## 2014-01-26 MED ORDER — PROPOFOL 10 MG/ML IV BOLUS
INTRAVENOUS | Status: DC | PRN
  Administered 2014-01-26: 130 mg via INTRAVENOUS

## 2014-01-26 MED ORDER — LIDOCAINE HCL (CARDIAC) 20 MG/ML IV SOLN
INTRAVENOUS | Status: DC | PRN
  Administered 2014-01-26: 60 mg via INTRAVENOUS

## 2014-01-26 MED ORDER — PROTAMINE SULFATE 10 MG/ML IV SOLN
INTRAVENOUS | Status: DC | PRN
  Administered 2014-01-26: 30 mg via INTRAVENOUS

## 2014-01-26 MED ORDER — HYDROMORPHONE HCL PF 1 MG/ML IJ SOLN
INTRAMUSCULAR | Status: AC
  Administered 2014-01-26: 0.5 mg via INTRAVENOUS
  Filled 2014-01-26: qty 1

## 2014-01-26 MED ORDER — CEFAZOLIN SODIUM-DEXTROSE 2-3 GM-% IV SOLR
INTRAVENOUS | Status: AC
  Filled 2014-01-26: qty 50

## 2014-01-26 MED ORDER — HEPARIN SODIUM (PORCINE) 1000 UNIT/ML IJ SOLN
INTRAMUSCULAR | Status: DC | PRN
  Administered 2014-01-26: 3000 [IU] via INTRAVENOUS

## 2014-01-26 MED ORDER — HYDROMORPHONE HCL PF 1 MG/ML IJ SOLN
0.2500 mg | INTRAMUSCULAR | Status: DC | PRN
Start: 2014-01-26 — End: 2014-01-26
  Administered 2014-01-26 (×4): 0.5 mg via INTRAVENOUS

## 2014-01-26 MED ORDER — ONDANSETRON HCL 4 MG/2ML IJ SOLN: 4.0000 mg | Freq: Once | INTRAMUSCULAR | Status: DC | PRN

## 2014-01-26 MED ORDER — SODIUM CHLORIDE 0.9 % IR SOLN
Status: DC | PRN
  Administered 2014-01-26: 08:00:00

## 2014-01-26 MED ORDER — FENTANYL CITRATE 0.05 MG/ML IJ SOLN
INTRAMUSCULAR | Status: DC | PRN
  Administered 2014-01-26: 100 ug via INTRAVENOUS

## 2014-01-26 MED ORDER — PROPOFOL 10 MG/ML IV BOLUS
INTRAVENOUS | Status: AC
  Filled 2014-01-26: qty 20

## 2014-01-26 MED ORDER — CEFAZOLIN SODIUM-DEXTROSE 2-3 GM-% IV SOLR
INTRAVENOUS | Status: DC | PRN
  Administered 2014-01-26: 2 g via INTRAVENOUS

## 2014-01-26 SURGICAL SUPPLY — 76 items
BAG BANDED W/RUBBER/TAPE 36X54 (MISCELLANEOUS) ×1 IMPLANT
BAG EQP BAND 135X91 W/RBR TAPE (MISCELLANEOUS) ×1
BANDAGE ELASTIC 4 VELCRO ST LF (GAUZE/BANDAGES/DRESSINGS) IMPLANT
BANDAGE ESMARK 6X9 LF (GAUZE/BANDAGES/DRESSINGS) IMPLANT
BNDG CMPR 9X6 STRL LF SNTH (GAUZE/BANDAGES/DRESSINGS)
BNDG ESMARK 6X9 LF (GAUZE/BANDAGES/DRESSINGS)
CANISTER SUCTION 2500CC (MISCELLANEOUS) ×2 IMPLANT
CLIP TI MEDIUM 24 (CLIP) ×2 IMPLANT
CLIP TI WIDE RED SMALL 24 (CLIP) ×2 IMPLANT
COVER DOME SNAP 22 D (MISCELLANEOUS) ×1 IMPLANT
COVER PROBE W GEL 5X96 (DRAPES) ×2 IMPLANT
COVER SURGICAL LIGHT HANDLE (MISCELLANEOUS) ×2 IMPLANT
CUFF TOURNIQUET SINGLE 24IN (TOURNIQUET CUFF) IMPLANT
CUFF TOURNIQUET SINGLE 34IN LL (TOURNIQUET CUFF) IMPLANT
CUFF TOURNIQUET SINGLE 44IN (TOURNIQUET CUFF) IMPLANT
DRAIN CHANNEL 15F RND FF W/TCR (WOUND CARE) IMPLANT
DRAPE C-ARM 42X72 X-RAY (DRAPES) ×1 IMPLANT
DRAPE WARM FLUID 44X44 (DRAPE) ×2 IMPLANT
DRSG COVADERM 4X10 (GAUZE/BANDAGES/DRESSINGS) IMPLANT
DRSG COVADERM 4X8 (GAUZE/BANDAGES/DRESSINGS) IMPLANT
ELECT REM PT RETURN 9FT ADLT (ELECTROSURGICAL) ×2
ELECTRODE REM PT RTRN 9FT ADLT (ELECTROSURGICAL) ×1 IMPLANT
EVACUATOR SILICONE 100CC (DRAIN) IMPLANT
GLOVE BIO SURGEON STRL SZ7 (GLOVE) ×2 IMPLANT
GLOVE BIOGEL PI IND STRL 7.0 (GLOVE) IMPLANT
GLOVE BIOGEL PI IND STRL 7.5 (GLOVE) ×1 IMPLANT
GLOVE BIOGEL PI IND STRL 8 (GLOVE) ×1 IMPLANT
GLOVE BIOGEL PI INDICATOR 7.0 (GLOVE) ×1
GLOVE BIOGEL PI INDICATOR 7.5 (GLOVE) ×1
GLOVE BIOGEL PI INDICATOR 8 (GLOVE) ×1
GLOVE ECLIPSE 6.5 STRL STRAW (GLOVE) ×4 IMPLANT
GOWN STRL REUS W/ TWL LRG LVL3 (GOWN DISPOSABLE) ×3 IMPLANT
GOWN STRL REUS W/TWL LRG LVL3 (GOWN DISPOSABLE) ×4
INSERT FOGARTY SM (MISCELLANEOUS) IMPLANT
KIT BASIN OR (CUSTOM PROCEDURE TRAY) ×2 IMPLANT
KIT ENCORE 26 ADVANTAGE (KITS) ×1 IMPLANT
KIT ROOM TURNOVER OR (KITS) ×2 IMPLANT
MARKER GRAFT CORONARY BYPASS (MISCELLANEOUS) IMPLANT
NDL PERC 18GX7CM (NEEDLE) IMPLANT
NEEDLE PERC 18GX7CM (NEEDLE) ×2 IMPLANT
NS IRRIG 1000ML POUR BTL (IV SOLUTION) ×4 IMPLANT
PACK PERIPHERAL VASCULAR (CUSTOM PROCEDURE TRAY) ×2 IMPLANT
PAD ARMBOARD 7.5X6 YLW CONV (MISCELLANEOUS) ×4 IMPLANT
PADDING CAST COTTON 6X4 STRL (CAST SUPPLIES) IMPLANT
SET MICROPUNCTURE 5F STIFF (MISCELLANEOUS) ×1 IMPLANT
SHEATH BRITE TIP 6FR 35CM (SHEATH) ×2 IMPLANT
SHEATH BRITE TIP 7FR 35CM (SHEATH) ×1 IMPLANT
SPONGE GAUZE 4X4 12PLY STER LF (GAUZE/BANDAGES/DRESSINGS) ×1 IMPLANT
SPONGE SURGIFOAM ABS GEL 100 (HEMOSTASIS) IMPLANT
STAPLER VISISTAT 35W (STAPLE) IMPLANT
STENT ICAST 7X38X120 (Permanent Stent) ×1 IMPLANT
STOPCOCK 4 WAY LG BORE MALE ST (IV SETS) IMPLANT
SUT ETHILON 3 0 PS 1 (SUTURE) IMPLANT
SUT GORETEX 5 0 TT13 24 (SUTURE) IMPLANT
SUT GORETEX 6.0 TT13 (SUTURE) IMPLANT
SUT MNCRL AB 4-0 PS2 18 (SUTURE) ×3 IMPLANT
SUT PROLENE 5 0 C 1 24 (SUTURE) ×1 IMPLANT
SUT PROLENE 6 0 BV (SUTURE) ×1 IMPLANT
SUT PROLENE 7 0 BV 1 (SUTURE) IMPLANT
SUT SILK 2 0 FS (SUTURE) ×1 IMPLANT
SUT SILK 3 0 (SUTURE)
SUT SILK 3-0 18XBRD TIE 12 (SUTURE) IMPLANT
SUT VIC AB 2-0 CT1 27 (SUTURE)
SUT VIC AB 2-0 CT1 TAPERPNT 27 (SUTURE) IMPLANT
SUT VIC AB 3-0 SH 27 (SUTURE)
SUT VIC AB 3-0 SH 27X BRD (SUTURE) ×3 IMPLANT
TAPE CLOTH SURG 4X10 WHT LF (GAUZE/BANDAGES/DRESSINGS) ×1 IMPLANT
TAPE VIPERTRACK RADIOPAQ 30X (MISCELLANEOUS) IMPLANT
TAPE VIPERTRACK RADIOPAQUE (MISCELLANEOUS) ×2
TOWEL OR 17X24 6PK STRL BLUE (TOWEL DISPOSABLE) ×4 IMPLANT
TOWEL OR 17X26 10 PK STRL BLUE (TOWEL DISPOSABLE) ×4 IMPLANT
TRAY FOLEY CATH 16FRSI W/METER (SET/KITS/TRAYS/PACK) IMPLANT
TUBING EXTENTION W/L.L. (IV SETS) IMPLANT
UNDERPAD 30X30 INCONTINENT (UNDERPADS AND DIAPERS) ×2 IMPLANT
WATER STERILE IRR 1000ML POUR (IV SOLUTION) ×2 IMPLANT
WIRE ROSEN-J .035X260CM (WIRE) ×1 IMPLANT

## 2014-01-26 NOTE — Progress Notes (Signed)
Patient on heparin drip, scheduled for procedure in the morning. MD.Chen paged concerning when to stop heparin drip, orders given. Heparin drip stopped at 0500 in preparation for procedure this morning.

## 2014-01-26 NOTE — Anesthesia Postprocedure Evaluation (Signed)
  Anesthesia Post-op Note  Patient: [name]  Procedure(s) Performed: Procedure(s): INSERTION OF ILIAC STENT- LEFT COMMON ILIAC ARTERY STENT  (Left)  Patient Location: PACU  Anesthesia Type:General  Level of Consciousness: awake, oriented, sedated and patient cooperative  Airway and Oxygen Therapy: Patient Spontanous Breathing  Post-op Pain: mild  Post-op Assessment: Post-op Vital signs reviewed, Patient's Cardiovascular Status Stable, Respiratory Function Stable, Patent Airway, No signs of Nausea or vomiting and Pain level controlled  Post-op Vital Signs: stable  Complications: No apparent anesthesia complications

## 2014-01-26 NOTE — Interval H&P Note (Signed)
[facility] [place]  History and Physical Update  The patient was interviewed and re-examined.  The patient's previous History and Physical has been reviewed and is unchanged from my consult except for: itnerval angiogram complicated by the patient's inability to cooperate.  The plan is: general anesthesia, left leg runoff, and left common iliac artery stenting.  [name], MD [facility] [place] Office: [phone] Pager: [phone]  01/26/2014, 7:03 AM

## 2014-01-26 NOTE — Preoperative (Signed)
Beta Blockers   Reason not to administer Beta Blockers:Not Applicable 

## 2014-01-26 NOTE — Transfer of Care (Signed)
Immediate Anesthesia Transfer of Care Note  Patient: [name]  Procedure(s) Performed: Procedure(s): INSERTION OF ILIAC STENT- LEFT COMMON ILIAC ARTERY STENT  (Left)  Patient Location: PACU  Anesthesia Type:General  Level of Consciousness: awake, alert  and oriented  Airway & Oxygen Therapy: Patient Spontanous Breathing  Post-op Assessment: Report given to PACU RN  Post vital signs: Reviewed and stable  Complications: No apparent anesthesia complications

## 2014-01-26 NOTE — Anesthesia Preprocedure Evaluation (Addendum)
Anesthesia Evaluation  Patient identified by MRN, date of birth, ID band Patient awake and Patient confused    Reviewed: Allergy & Precautions, H&P , NPO status , Patient's Chart, lab work & pertinent test results  Airway       Dental   Pulmonary COPDformer smoker,          Cardiovascular + Peripheral Vascular Disease     Neuro/Psych  Headaches,    GI/Hepatic GERD-  ,  Endo/Other    Renal/GU      Musculoskeletal   Abdominal   Peds  Hematology  (+) anemia ,   Anesthesia Other Findings dementia  Reproductive/Obstetrics                          Anesthesia Physical Anesthesia Plan  ASA: II  Anesthesia Plan: General   Post-op Pain Management:    Induction: Intravenous  Airway Management Planned: Oral ETT  Additional Equipment:   Intra-op Plan:   Post-operative Plan: Extubation in OR  Informed Consent: I have reviewed the patients History and Physical, chart, labs and discussed the procedure including the risks, benefits and alternatives for the proposed anesthesia with the patient or authorized representative who has indicated his/her understanding and acceptance.     Plan Discussed with:   Anesthesia Plan Comments:         Anesthesia Quick Evaluation

## 2014-01-26 NOTE — Op Note (Signed)
OPERATIVE NOTE   PROCEDURE: 1.  Left common femoral artery cannulation under ultrasound guidance 2.  Left pelvic angiogram 3.  Left common iliac artery stenting and angioplasty (iCAST 7 mm x 38 mm)  PRE-OPERATIVE DIAGNOSIS: Left foot thromboembolism, left common iliac artery plaque  POST-OPERATIVE DIAGNOSIS: same as above   SURGEON: [name], MD  ANESTHESIA: conscious sedation  ESTIMATED BLOOD LOSS: 50 cc  CONTRAST: 50 cc  FINDING(S): Left common iliac artery plaque with leading edge and irregularity throughout common iliac artery with distal common iliac artery stenosis: resolved after covered stenting and angioplasty  SPECIMEN(S):  none  INDICATIONS:   [name] is a 63 y.o. female who presents with left foot thromboembolism.  Based on serial CTA, the embolic source was determined to be Left common iliac artery.  An angiogram determined that the left common iliac artery plaque had a leading edge with flow limiting plaque that was likely the source for the patient's embolism.  She was brought to the angiography suite but due to her dementia, she was unable to cooperate and caused a bleeding complication due to her inability to hold still.  The patient presents for: general anesthesia and left common iliac artery stenting.  I discussed with the patient's husband the nature of angiographic procedures, especially the limited patencies of any endovascular intervention.  The patient's husband is aware of that the risks of an angiographic procedure include but are not limited to: bleeding, infection, access site complications, renal failure, embolization, rupture of vessel, dissection, possible need for emergent surgical intervention, possible need for surgical procedures to treat the patient's pathology, and stroke and death.  The patient's husband is aware of the risks and agrees to proceed.  DESCRIPTION:  After full informed consent was obtained from the patient, the patient was  brought back to the angiography suite.  The patient was placed supine upon the angiography table and connected to monitoring equipment.  The patient was then given conscious sedation, the amounts of which are documented in the patient's chart.  The patient was prepped and drape in the standard fashion for an angiographic procedure.  At this point, attention was turned to the left groin.  Under ultrasound guidance, the left common femoral artery was cannulated with a micropuncture needle.  The microwire was advanced into the iliac arterial system.  The needle was exchanged for a microsheath, which was loaded into the common femoral artery over the wire.  The microwire was exchanged for a [facility] wire which was advanced into the aorta.  The microsheath was then exchanged for a 6-Fr sheath which was loaded into the common femoral artery.   Based on hand injections, the left common iliac artery lesion was identified.  Based on measurements, a 7 mm x 38 mm iCAST stent was selected.  Based on the stent size, a 7 Fr sheath was needed.  I exchanged the wire for a long Rosen wire.  The sheath was exchanged for a long 7 mm sheath which was lodged in the distal aorta.  The patient was given 3000 units of Heparin intravenously, which was a therapeutic bolus.    The stent was positioned 5 mm proximal to the leading edge of the common iliac artery lesion.  The sheath was pulled back distal to the stent.  A hand injection demonstrated good position for the stent.  The stent was deployed at 8 atm to profile.  There was some waist visualized during this process.  The balloon was removed.  I then placed a pigtail catheter in the distal aorta and did a completion angiogram.  This demonstrated complete coverage of the leading edge and coverage of the distal stenosis.  The wire was removed.  At this point, the patient was given 30 mg of Protamine.  After waiting 10 minutes, an ACT was drawn which demonstrated reversal of the heparin.   The left sheath was removed and pressure held for 15 minutes.  A sterile bandage was applied to the puncture site.  COMPLICATIONS: none  CONDITION: stable  [name], MD [facility] [place] Office: [phone] Pager: [phone]  01/26/2014, 8:27 AM

## 2014-01-26 NOTE — Progress Notes (Signed)
   CBC    Component Value Date/Time   WBC 8.8 01/26/2014 0315   RBC 2.65* 01/26/2014 0315   HGB 7.5* 01/26/2014 0315   HCT 23.5* 01/26/2014 0315   PLT 75* 01/26/2014 0315   MCV 88.7 01/26/2014 0315   MCH 28.3 01/26/2014 0315   MCHC 31.9 01/26/2014 0315   RDW 16.2* 01/26/2014 0315   LYMPHSABS 2.7 01/17/2014 1741   MONOABS 0.6 01/17/2014 1741   EOSABS 0.2 01/17/2014 1741   BASOSABS 0.0 01/17/2014 1741   - Plt dropping so will hold Heparin for now and obtain HIT panel.    - Keep pt on Plavix for the new stent   [name], MD [facility] [place]: [phone] Pager: [phone]  01/26/2014, 11:37 AM

## 2014-01-26 NOTE — Progress Notes (Signed)
Heparin level 0.2 this am, below goal, has already been turned off for procedure.  Will f/u.  Wynona Neat, PharmD, BCPS 01/26/2014 5:21 AM

## 2014-01-26 NOTE — Progress Notes (Signed)
[facility] Progress Note   [name] [id number] DOB: 1951-03-26 DOA: 01/17/2014 PCP: Teressa Lower, MD  Brief narrative: [name] is a 63 y.o. female presenting on 01/17/2014 with  has a past medical history of Renal disorder; Subacute delirium (10/14/2013); Alzheimer's dementia; Toxic encephalopathy; and UTI who presented to [facility] with left foot pain and was found to have ischemia. Vascular surgery was consulted, recommended transfer to [facility] where she underwent an angiogram on 2/11.    Subjective: Complains of significant pain to her left foot.  Assessment/Plan:    Critical lower limb ischemia - management per vascular surgery - no intervention perfomed on 2/11 since there was extravasation of dye noted as pt was not able to lay still for the procedure - For stent of left common iliac under general anaesthesia on Tuesday 2/17.    Acute blood loss anemia - during angiogram 2/11 - given 2 U PRBC on 2/11 and 1 U again 2/13 - H and H stable.  Thrombocytopenia - possible due to consumption- follow -Recheck CBC in am.    ALZHEIMERS DISEASE - cont home meds  - increase Seroquel at bedtime - trail of Zyprexa during the day   Previous VRE UTI present on admission- Renal calculus with Double J stent in left kidney - last day of Zyvox - 2/13 - nonspecific left sided peri-nephric stranding noted on CT along which changed consistent with cystitis - repeat urine culture negative although large leukocytes in urine - follow WBC count and temp off antibiotics  Acute renal failure - resolved with hydration.   History of C diff - - has  not recently been taking fidaxomycin as an outpatient. This was verified by pharmacy - Monitor for diarrhea  - Started florastor  Severe protein calorie malnutrition  - add TID ensure- per RN PO intake is extremely poor- per family, she barely eats at home - adjusting psych meds to see if agitation can be controlled and subsequently will regain  an appetite   Partial hemicolectomy with ileocolic anastamosis    Code Status: full code Family Communication: with husband and son at bedside on 2/13; none today Disposition Plan: SNF once medically ready.  Consultants: Vascular sx   Procedures: ABI  LE BL duplex  CT angio chest/abd/pelvis 2/10  Arteriogram 2/11  Antibiotics: linezolid 2/9 (started prior to 2/9) >>  DVT prophylaxis: SCDs  Objective: Filed Weights   01/17/14 2144 01/18/14 0113  Weight: 36.741 kg (81 lb) 36.74 kg (81 lb)   Blood pressure 140/69, pulse 73, temperature 98.3 F (36.8 C), temperature source Oral, resp. rate 16, height 5\' 2"  (1.575 m), weight 36.74 kg (81 lb), SpO2 100.00%.  Intake/Output Summary (Last 24 hours) at 01/26/14 1439 Last data filed at 01/26/14 0915  Gross per 24 hour  Intake    820 ml  Output   1350 ml  Net   -530 ml     Exam: General: confused, less agitated but still distresses No acute respiratory distress Lungs: Clear to auscultation bilaterally without wheezes or crackles Cardiovascular: Regular rate and rhythm without murmur gallop or rub normal S1 and S2 Abdomen: Nontender, nondistended, soft, bowel sounds positive, no rebound, no ascites, no appreciable mass Extremities: No significant clubbing, or edema bilateral lower extremities-blakish discoloration of left toes noted  Data Reviewed: Basic Metabolic Panel:  Recent Labs Lab 01/22/14 0440 01/23/14 0308 01/24/14 0445 01/25/14 0115 01/26/14 0315  NA 142 144 142 138 137  K 4.8 4.3 3.9 4.1 4.1  CL 112  112 108 104 104  CO2 19 21 21 23 24   GLUCOSE 100* 99 131* 106* 100*  BUN 16 13 12 13 10   CREATININE 1.27* 1.09 1.07 1.04 1.09  CALCIUM 8.0* 8.2* 7.8* 7.9* 7.8*   Liver Function Tests: No results found for this basename: AST, ALT, ALKPHOS, BILITOT, PROT, ALBUMIN,  in the last 168 hours No results found for this basename: LIPASE, AMYLASE,  in the last 168 hours No results found for this basename:  AMMONIA,  in the last 168 hours CBC:  Recent Labs Lab 01/21/14 0235 01/22/14 0440 01/23/14 0850 01/25/14 0115 01/26/14 0315  WBC 7.6 6.2 7.2 7.3 8.8  HGB 8.2* 7.7* 10.0* 8.3* 7.5*  HCT 24.9* 23.0* 30.4* 25.8* 23.5*  MCV 85.6 86.1 86.9 88.4 88.7  PLT 138* 112* 119* 90* 75*   Cardiac Enzymes: No results found for this basename: CKTOTAL, CKMB, CKMBINDEX, TROPONINI,  in the last 168 hours BNP (last 3 results) No results found for this basename: PROBNP,  in the last 8760 hours CBG: No results found for this basename: GLUCAP,  in the last 168 hours  Recent Results (from the past 240 hour(s))  URINE CULTURE     Status: None   Collection Time    01/20/14  8:24 AM      Result Value Ref Range Status   Specimen Description URINE, RANDOM   Final   Special Requests ADDED [id number]   Final   Culture  Setup Time     Final   Value: 01/21/2014 01:48     Performed at [facility]     Final   Value: NO GROWTH     Performed at [facility]   Culture     Final   Value: NO GROWTH     Performed at [facility]   Report Status 01/21/2014 FINAL   Final  MRSA PCR SCREENING     Status: None   Collection Time    01/20/14  3:38 PM      Result Value Ref Range Status   MRSA by PCR NEGATIVE  NEGATIVE Final   Comment:            The GeneXpert MRSA Assay (FDA     approved for NASAL specimens     only), is one component of a     comprehensive MRSA colonization     surveillance program. It is not     intended to diagnose MRSA     infection nor to guide or     monitor treatment for     MRSA infections.     Scheduled Meds:  Scheduled Meds: . amLODipine  5 mg Oral Daily  . clopidogrel  75 mg Oral Q breakfast  . docusate sodium  100 mg Oral Daily  . donepezil  10 mg Oral QHS  . feeding supplement (ENSURE)  1 Container Oral TID BM  . magnesium oxide  400 mg Oral Daily  . metoprolol tartrate  25 mg Oral BID  . OLANZapine  2.5 mg Oral Daily  .  phenytoin  200 mg Oral QHS  . QUEtiapine  50 mg Oral QHS,MR X 1  . saccharomyces boulardii  250 mg Oral BID  . sodium chloride  10 mL Intravenous Q12H  . sodium chloride  3 mL Intravenous Q12H   Continuous Infusions: . sodium chloride Stopped (01/20/14 1934)    Time spent on care of this patient: 25 min   HERNANDEZ  Reather Converse, MD [facility] Office  [phone] Pager: [phone]   If 7PM-7AM, please contact night-coverage [email or web address] Password [facility] 01/26/2014, 2:39 PM   LOS: 9 days

## 2014-01-26 NOTE — H&P (View-Only) (Signed)
[facility] [place] NOTE   MRN : [id number]  Reason for Consult: left leg ischemia Referring Physician: Etta Quill, DO   History of Present Illness: 63 y/o female admitted secondary to left foot pain associated with discoloration.  Her foot pain started 3 weeks ago and the toe darkness discoloration has progressed over the past few days.  She reports no history of claudication symptoms, decreased sensation or injury.  Past medical history includes hypertension and  Alzheimer's dementia.  She denies heart disease, hypercholesterolemia and diabetes.  She takes metoprolol and is on Plavix since she was seen a few weeks ago at in [facility] with her foot pain.  She has a history of intermittent cardiac arrhythmia.  Her risk factors of atherosclerosis include: active smoking.  Pt notes unintentional weight loss over the few months.  Current Facility-Administered Medications  Medication Dose Route Frequency Provider Last Rate Last Dose  . 0.9 %  sodium chloride infusion   Intravenous Continuous Etta Quill, DO 75 mL/hr at 01/17/14 2151    . amLODipine (NORVASC) tablet 5 mg  5 mg Oral Daily Etta Quill, DO   5 mg at 01/17/14 2358  . clopidogrel (PLAVIX) tablet 75 mg  75 mg Oral Q breakfast Etta Quill, DO      . donepezil (ARICEPT) tablet 10 mg  10 mg Oral QHS Etta Quill, DO   10 mg at 01/17/14 2358  . fidaxomicin (DIFICID) tablet 200 mg  200 mg Oral BID Etta Quill, DO   200 mg at 01/17/14 2358  . heparin ADULT infusion 100 units/mL (25000 units/250 mL)  750 Units/hr Intravenous Continuous Etta Quill, DO 7.5 mL/hr at 01/17/14 2254 750 Units/hr at 01/17/14 2254  . HYDROmorphone (DILAUDID) injection 1 mg  1 mg Intravenous Q2H PRN Etta Quill, DO   1 mg at 01/18/14 [id number]  . ibuprofen (ADVIL,MOTRIN) tablet 600 mg  600 mg Oral Q6H PRN Etta Quill, DO      . linezolid (ZYVOX) tablet 600 mg  600 mg Oral Q12H Dianne Dun,  NP   600 mg at 01/18/14 0442  . magnesium oxide (MAG-OX) tablet 400 mg  400 mg Oral Daily Etta Quill, DO   400 mg at 01/17/14 2359  . metoprolol tartrate (LOPRESSOR) tablet 25 mg  25 mg Oral BID Etta Quill, DO   25 mg at 01/17/14 2148  . phenytoin (DILANTIN) ER capsule 200 mg  200 mg Oral QHS Etta Quill, DO   200 mg at 01/17/14 2149  . QUEtiapine (SEROQUEL) tablet 25 mg  25 mg Oral QPM Etta Quill, DO   25 mg at 01/18/14 0245  . sodium chloride 0.9 % injection 3 mL  3 mL Intravenous Q12H Etta Quill, DO        Pt meds include: Statin :No Betablocker: Yes ASA: No Other anticoagulants/antiplatelets: Plavix  Past Medical History  Diagnosis Date  . Renal disorder     kidney stones  . Subacute delirium 10/14/2013  . Alzheimer's dementia   . Toxic encephalopathy   . UTI (lower urinary tract infection)     Past Surgical History  Procedure Laterality Date  . Abdominal hysterectomy    . Knee surgery    . Bladder surgery      Social History History  Substance Use Topics  . Smoking status: Former Smoker -- 1.00 packs/day    Types: Cigarettes  . Smokeless tobacco: Not  on file  . Alcohol Use: No    Family History Pt cannot recall any medical problems with her parents  Allergies  Allergen Reactions  . Codeine     REACTION: intolerance    REVIEW OF SYSTEMS  General: [ ]  Weight loss, [ ]  Fever, [ ]  chills Neurologic: [ ]  Dizziness, [ ]  Blackouts, [ ]  Seizure [ ]  Stroke, [ ]  "Mini stroke", [ ]  Slurred speech, [ ]  Temporary blindness; [ ]  weakness in arms or legs, [ ]  Hoarseness [ ]  Dysphagia [x]  alzheimer's   Cardiac: [ ]  Chest pain/pressure, [ ]  Shortness of breath at rest [ ]  Shortness of breath with exertion, [ ]  Atrial fibrillation or irregular heartbeat  Vascular: [ ]  Pain in legs with walking, [x ] Pain in legs at rest, [ ]  Pain in legs at night,  [x ] Non-healing ulcer, [ ]  Blood clot in vein/DVT,   Pulmonary: [ ]  Home oxygen, [ ]  Productive  cough, [ ]  Coughing up blood, [ ]  Asthma,  [ ]  Wheezing [ ]  COPD Musculoskeletal:  [ ]  Arthritis, [ ]  Low back pain, [ ]  Joint pain Hematologic: [ ]  Easy Bruising, [ ]  Anemia; [ ]  Hepatitis Gastrointestinal: [ ]  Blood in stool, [ ]  Gastroesophageal Reflux/heartburn, Urinary: [x ] chronic Kidney disease, [ ]  on HD - [ ]  MWF or [ ]  TTHS, [ ]  Burning with urination, [x ] Difficulty urinating Skin: [ ]  Rashes, [x ] Wounds Psychological: [ ]  Anxiety, [ ]  Depression  Physical Examination Filed Vitals:   01/17/14 2151 01/18/14 0003 01/18/14 0113 01/18/14 0447  BP: 161/67 146/70 147/57 127/82  Pulse:  60 52 54  Temp:   97.9 F (36.6 C) 97.8 F (36.6 C)  TempSrc:   Oral Oral  Resp:  18 20 20   Height:   5\' 2"  (1.575 m)   Weight:   81 lb (36.74 kg)   SpO2:  97% 99% 99%   Body mass index is 14.81 kg/(m^2).  General:  WDWN in NAD HENT: WNL Eyes: Pupils equal Pulmonary: normal non-labored breathing , without Rales, rhonchi,  wheezing Cardiac: RRR, without  Murmurs, rubs or gallops; No carotid bruits Abdomen: soft, NT, no masses Skin: no rashes, ulcers noted digits 1,4, and 5 cool with bluish/black discoloration painful to touch ;  no Gangrene , no cellulitis; no open wounds;  Vascular Exam/Pulses:Palpable femoral, popliteal pulses.  Biphasic doppler peroneal, PT/DP signal. Musculoskeletal: no muscle wasting or atrophy; no edema  Neurologic: A&O X 3; Appropriate Affect ; SENSATION: normal; MOTOR FUNCTION: 5/5 Symmetric; Speech is fluent/normal Psych: judgement inact, mood and affect appropritate Lymph: no palpable cervical or inguinal LAD  Significant Diagnostic Studies: CBC Lab Results  Component Value Date   WBC 7.3 01/17/2014   HGB 8.9* 01/17/2014   HCT 27.5* 01/17/2014   MCV 85.1 01/17/2014   PLT 225 01/17/2014    BMET    Component Value Date/Time   NA 138 01/17/2014 1741   K 3.9 01/17/2014 1741   CL 105 01/17/2014 1741   CO2 19 01/17/2014 1741   GLUCOSE 91 01/17/2014 1741   BUN 13  01/17/2014 1741   CREATININE 1.55* 01/17/2014 1741   CALCIUM 7.9* 01/17/2014 1741   GFRNONAA 35* 01/17/2014 1741   GFRAA 40* 01/17/2014 1741   Estimated Creatinine Clearance: 21.8 ml/min (by C-G formula based on Cr of 1.55).  COAG Lab Results  Component Value Date   INR 1.01 01/17/2014   INR 1.0 10/10/2007   Radiology: Dg Foot  Complete Left  01/17/2014   CLINICAL DATA:  Pain in left foot and toes for 1 month. Discoloration of the fourth and fifth toes.  EXAM: LEFT FOOT - COMPLETE 3+ VIEW  COMPARISON:  CT EXTREM LOW W/O CM*L* dated 02/25/2013; DG FOOT COMPLETE 3+V*L* dated 02/19/2013  FINDINGS: The bones are diffusely demineralized. There is no evidence of acute fracture, dislocation or bone destruction. No focal soft tissue swelling or soft tissue emphysema identified.  IMPRESSION: Generalized osteopenia. No evidence of acute fracture or osteomyelitis.   Electronically Signed   By: Camie Patience M.D.   On: 01/17/2014 17:08    Non-Invasive Vascular Imaging: pending ABI's and Left LE arterial duplex.   ASSESSMENT/PLAN:  Left foot with likely embolic event to digits 1, 4, and 5.  She has good arterial flow to her foot via doppler biphasic signals.  Pending further studies duplex and ABI  Theda Sers, EMMA [facility] 01/18/2014 7:29 AM  Addendum  I have independently interviewed and examined the patient, and I agree with the physician assistant's findings.  Pt's history suspicious for thromboembolic event with possible chronic peripheral arterial disease.     ABI and LLE arterial duplex ordered.    Echocardiogram to evaluate for intra-cardiac thrombus ordered.    CTA chest/abd/pelvis +/- Aortogram with bilateral leg runoff will likely be needed, but I would wait until ABI and LLE arterial duplex comes back first.    Ok to switch patient to Lovenox at this point.  Would not start Coumadin until work-up completed.  [name], MD [facility] [place] Office:  [phone] Pager: [phone]  01/18/2014, 9:39 AM

## 2014-01-26 NOTE — Progress Notes (Signed)
Physical Therapy Note  Pt currently off of floor for procedure.  Will need new orders post-op.    [place], [place]

## 2014-01-27 ENCOUNTER — Ambulatory Visit: Payer: Medicare Other | Admitting: Nurse Practitioner

## 2014-01-27 DIAGNOSIS — B952 Enterococcus as the cause of diseases classified elsewhere: Secondary | ICD-10-CM

## 2014-01-27 DIAGNOSIS — N390 Urinary tract infection, site not specified: Secondary | ICD-10-CM

## 2014-01-27 LAB — CBC
HEMATOCRIT: 22.8 % — AB (ref 36.0–46.0)
HEMOGLOBIN: 7.5 g/dL — AB (ref 12.0–15.0)
MCH: 28.8 pg (ref 26.0–34.0)
MCHC: 32.9 g/dL (ref 30.0–36.0)
MCV: 87.7 fL (ref 78.0–100.0)
PLATELETS: 75 10*3/uL — AB (ref 150–400)
RBC: 2.6 MIL/uL — ABNORMAL LOW (ref 3.87–5.11)
RDW: 16.1 % — ABNORMAL HIGH (ref 11.5–15.5)
WBC: 6 10*3/uL (ref 4.0–10.5)

## 2014-01-27 LAB — BASIC METABOLIC PANEL
BUN: 8 mg/dL (ref 6–23)
CO2: 24 meq/L (ref 19–32)
Calcium: 8.2 mg/dL — ABNORMAL LOW (ref 8.4–10.5)
Chloride: 107 mEq/L (ref 96–112)
Creatinine, Ser: 1.02 mg/dL (ref 0.50–1.10)
GFR calc Af Amer: 67 mL/min — ABNORMAL LOW (ref >90)
GFR calc non Af Amer: 58 mL/min — ABNORMAL LOW (ref >90)
GLUCOSE: 96 mg/dL (ref 70–99)
Potassium: 4 mEq/L (ref 3.7–5.3)
Sodium: 139 mEq/L (ref 137–147)

## 2014-01-27 LAB — OCCULT BLOOD X 1 CARD TO LAB, STOOL: Fecal Occult Bld: NEGATIVE

## 2014-01-27 LAB — POCT ACTIVATED CLOTTING TIME: Activated Clotting Time: 138 seconds

## 2014-01-27 MED ORDER — OXYCODONE-ACETAMINOPHEN 5-325 MG PO TABS: 1.0000 | ORAL_TABLET | ORAL | Status: DC | PRN

## 2014-01-27 MED ORDER — CLOPIDOGREL BISULFATE 75 MG PO TABS
75.0000 mg | ORAL_TABLET | Freq: Every day | ORAL | Status: DC
Start: 2014-01-27 — End: 2014-09-03

## 2014-01-27 NOTE — Discharge Summary (Addendum)
Physician Discharge Summary  [name] [id number] DOB: 10/30/1951 DOA: 01/17/2014  PCP: Teressa Lower, MD  Admit date: 01/17/2014 Discharge date: 01/27/2014  Time spent: >35 minutes  Recommendations for Outpatient Follow-up:  HHC F/u with vascular surgery in 3-4 weeks F/u with PCP next week  Discharge Diagnoses:  Principal Problem:   Critical lower limb ischemia Active Problems:   ALZHEIMERS DISEASE   GERD   INTERSTITIAL CYSTITIS   Protein-calorie malnutrition, severe   UTI (urinary tract infection) due to Enterococcus   Acute blood loss anemia   Discharge Condition: stable   Diet recommendation: heart healthy   Filed Weights   01/17/14 2144 01/18/14 0113  Weight: 36.741 kg (81 lb) 36.74 kg (81 lb)    History of present illness:  [name] is a 63 y.o. female presenting on 01/17/2014 with has a past medical history of Renal disorder; Subacute delirium (10/14/2013); Alzheimer's dementia; Toxic encephalopathy; and UTI who presented to [facility] with left foot pain and was found to have ischemia. Vascular surgery was consulted, recommended transfer to [facility] where she underwent an angiogram on 2/11.   Hospital Course:  1. Critical lower limb ischemia  -s/p L CIA PTA+Stent stable; recommended to cont plavix;  -per VVS: Left foot thromboembolism: I suspect the great toe and 4th toe are likely to survive, the distal phalange of the 5th toe might eventually need amputation. Recommended outpatient follow up in 3-4 weeks  2. Acute blood loss anemia during angiogram 2/11  - given 2 U PRBC on 2/11 and 1 U again 2/13; H and H stable. No s/s of acute bleeding; pend occult blood test  3. Thrombocytopenia possible due to consumption -no s/s of acute bleeding; platelet count are stable; follow up HIT panel with PCP next week   4. ALZHEIMERS DISEASE cont home meds  5. Previous VRE UTI present on admission- Renal calculus with Double J stent in left kidney  - last day of Zyvox - 2/13  -  nonspecific left sided peri-nephric stranding noted on CT along which changed consistent with cystitis  - repeat urine culture negative although large leukocytes in urine  6. Acute renal failure resolved with hydration.  History of C diff -  - has not recently been taking fidaxomycin as an outpatient. This was verified by pharmacy  - no diarrhea   7. Severe protein calorie malnutrition  - add TID ensure    Procedures:  PCI (i.e. Studies not automatically included, echos, thoracentesis, etc; not x-rays)  Consultations:  Vascular   Discharge Exam: Filed Vitals:   01/27/14 0617  BP: 127/64  Pulse: 85  Temp: 98.1 F (36.7 C)  Resp: 16    General: alert Cardiovascular: s1,s2 rrr Respiratory: CTA BL  Discharge Instructions  Discharge Orders   Future Appointments Provider Department Dept Phone   02/26/2014 9:15 AM Conrad Marathon, MD [facility] [phone]   Future Orders Complete By Expires   Diet - low sodium heart healthy  As directed    Discharge instructions  As directed    Comments:     Follow up with primary care doctor in 1 week   Increase activity slowly  As directed        Medication List    STOP taking these medications       ibuprofen 200 MG tablet  Commonly known as:  ADVIL,MOTRIN      TAKE these medications       amLODipine 5 MG tablet  Commonly known  as:  NORVASC  Take 5 mg by mouth daily.     clopidogrel 75 MG tablet  Commonly known as:  PLAVIX  Take 1 tablet (75 mg total) by mouth daily with breakfast.     donepezil 10 MG tablet  Commonly known as:  ARICEPT  Take 1 tablet (10 mg total) by mouth at bedtime.     magnesium oxide 400 MG tablet  Commonly known as:  MAG-OX  Take 400 mg by mouth daily.     metoprolol tartrate 25 MG tablet  Commonly known as:  LOPRESSOR  Take 25 mg by mouth 2 (two) times daily.     oxyCODONE-acetaminophen 5-325 MG per tablet  Commonly known as:  ROXICET  Take 1 tablet by  mouth every 4 (four) hours as needed for severe pain.     phenytoin 100 MG ER capsule  Commonly known as:  DILANTIN  Take 2 capsules (200 mg total) by mouth at bedtime.     QUEtiapine 25 MG tablet  Commonly known as:  SEROQUEL  Take 1 tablet (25 mg total) by mouth every evening.     traMADol 100 MG 24 hr tablet  Commonly known as:  ULTRAM-ER  Take 100 mg by mouth 4 (four) times daily.     zolpidem 10 MG tablet  Commonly known as:  AMBIEN  Take 1 tablet (10 mg total) by mouth at bedtime as needed for sleep. Take one half tab by mouth prn insomnia.       Allergies  Allergen Reactions  . Codeine     REACTION: intolerance       Follow-up Information   Follow up with Hinda Lenis, MD In 1 month. (Office will call you to arrange your appt (sent))    Specialty:  Vascular Surgery   Contact information:   [address] [place] [address] [phone]       Follow up with DOUGH,ROBERT, MD. Schedule an appointment as soon as possible for a visit in 1 week.   Specialty:  Family Medicine   Contact information:   [address] [place] [address] [phone]        The results of significant diagnostics from this hospitalization (including imaging, microbiology, ancillary and laboratory) are listed below for reference.    Significant Diagnostic Studies: Ir Fluoro Guide Cv Line Right  01/23/2014   CLINICAL DATA:  Dementia and left foot ischemia. Need for additional IV access.  EXAM: MIDLINE VENOUS CATHETER PLACEMENT WITH ULTRASOUND AND FLUOROSCOPIC GUIDANCE  FLUOROSCOPY TIME:  2 MIN AND 36 SECONDS.  PROCEDURE: Informed consent was obtained by telephone from the patient's husband prior to the procedure.  The right arm was prepped with chlorhexidine, draped in the usual sterile fashion using maximum barrier technique (cap and mask, sterile gown, sterile gloves, large sterile sheet, hand hygiene and cutaneous antisepsis) and infiltrated locally with 1% Lidocaine.   Ultrasound demonstrated patency of the right brachial vein, and this was documented with an image. Under real-time ultrasound guidance, this vein was accessed with a 21 gauge micropuncture needle and image documentation was performed. A 0.018 wire was introduced in to the vein.  Over this, a 5.0 [place] dual lumen catheter cut to 12 cm was advanced. Fluoroscopy during the procedure and fluoro spot radiograph confirms appropriate catheter position. The catheter was flushed, secured with retention sutures and covered with a sterile dressing.  COMPLICATIONS: None  FINDINGS: After obtaining venous access in the right arm, there was inability to pass a catheter the  on the axillary vein. This may be secondary to underlying venous stenosis. Given current IV access needs, a short catheter was placed terminating in the axillary vein.  IMPRESSION: Successful right arm dual-lumen midline venous catheter placement with ultrasound and fluoroscopic guidance. The tip terminates in the right axillary vein. The catheter is ready for use.   Electronically Signed   By: Aletta Edouard M.D.   On: 01/23/2014 15:43   Dg Chest Port 1 View  01/25/2014   CLINICAL DATA:  Left common iliac artery stenosis. Pre operative respiratory exam.  EXAM: PORTABLE CHEST - 1 VIEW  COMPARISON:  Chest x-ray dated 11/19/2013  FINDINGS: Heart size and pulmonary vascularity are normal and the lungs are clear. No acute osseous abnormality. Prior surgery at the right [facility] joint.  There is a catheter in the right upper arm with the tip in the axilla.  IMPRESSION: Normal appearing chest.   Electronically Signed   By: Rozetta Nunnery M.D.   On: 01/25/2014 10:53   Dg Foot Complete Left  01/17/2014   CLINICAL DATA:  Pain in left foot and toes for 1 month. Discoloration of the fourth and fifth toes.  EXAM: LEFT FOOT - COMPLETE 3+ VIEW  COMPARISON:  CT EXTREM LOW W/O CM*L* dated 02/25/2013; DG FOOT COMPLETE 3+V*L* dated 02/19/2013  FINDINGS: The bones are diffusely  demineralized. There is no evidence of acute fracture, dislocation or bone destruction. No focal soft tissue swelling or soft tissue emphysema identified.  IMPRESSION: Generalized osteopenia. No evidence of acute fracture or osteomyelitis.   Electronically Signed   By: Camie Patience M.D.   On: 01/17/2014 17:08   Ct Angio Chest Aortic Dissect W &/or W/o  01/19/2014   CLINICAL DATA:  Left foot embolic disease, evaluate aorta for potential source lesion.  EXAM: CT ANGIOGRAPHY CHEST, ABDOMEN AND PELVIS  TECHNIQUE: Multidetector CT imaging through the chest, abdomen and pelvis was performed using the standard protocol during bolus administration of intravenous contrast. Multiplanar reconstructed images and MIPs were obtained and reviewed to evaluate the vascular anatomy.  CONTRAST:  40mL OMNIPAQUE IOHEXOL 350 MG/ML SOLN  COMPARISON:  CTA chest, abdomen and pelvis performed 01/07/2014 at [facility].  FINDINGS: CTA CHEST FINDINGS  Mediastinum: Unremarkable CT appearance of the thyroid gland. No suspicious mediastinal or hilar adenopathy. No soft tissue mediastinal mass. The thoracic esophagus is unremarkable.  Heart/Vascular: Conventional 3 vessel aortic arch. No aneurysmal dilatation, acute dissection, irregular plaque or wall adherent mural thrombus. Mild atherosclerotic calcification noted at the right coronary artery origin. The heart is within normal limits for size. No pericardial effusion. The left atrial appendage is well opacified. No evidence of thrombus. No atrial or ventricular thrombus identified.  Lungs/Pleura: Mild centrilobular emphysema. No suspicious pulmonary nodules. Mild biapical pleural parenchymal scarring. Trace dependent atelectasis and small bilateral effusions. The lungs are otherwise clear.  Bones/Soft Tissues: No acute fracture or aggressive appearing lytic or blastic osseous lesion.  Review of the MIP images confirms the above findings.  CTA ABDOMEN AND PELVIS FINDINGS  VASCULAR   Aorta: Scattered heterogeneous but smooth atherosclerotic plaque throughout the normal caliber abdominal aorta. No evidence of aneurysmal dilatation, plaque ulceration or wall adherent mural thrombus. The distal aorta is relatively small in caliber.  Celiac: Widely patent.  Conventional hepatic arterial anatomy.  SMA: Widely patent.  Renals: Single dominant renal arteries bilaterally. Mild atherosclerotic plaque results in mild narrowing of both arteries. The the left renal artery is slightly atretic.  IMA: Non calcified atherosclerotic plaque results and  a moderate to high-grade stenosis at the origin of the inferior mesenteric artery.  Inflow: Heterogeneous atherosclerotic plaque. There are moderate bilateral common iliac artery stenoses. On the left, just before the common iliac bifurcation there is a small focal partially calcified and non calcified atherosclerotic plaque. Correlation with the recent prior CTA study reveals that there was a larger ulcerative plaque with some adherent mural thrombus at this location which is no longer evident. This is almost certainly the source of the patient's embolic disease. The bilateral internal iliac arteries are patent.  Proximal Outflow: Mild heterogeneous plaque along the posterior wall of the bilateral common femoral arteries without significant stenosis. The proximal visualize superficial and profunda femoral arteries are patent and relatively disease free.  Veins: No focal venous abnormality given the limitations of non venous phase timing.  NON-VASCULAR  Abdomen: Unremarkable CT appearance of the stomach, duodenum, and pancreas. Abnormal mottled appearance of the spleen. Some of the mottling is similar to that seen on the prior study, however there is accentuation extending nearly to the cortex in the anterior and lower pole. This could simply represent normal splenic arterial profusion pattern, however the possibility of small splenic infarcts is not excluded  entirely. There are several punctate calcifications which are unchanged compared to prior and likely reflecting the sequelae of prior granulomatous disease.  No suspicious hepatic lesion. Normal hepatic morphology and contour. There are 2 small calcified granulomas. Gallbladder is unremarkable. No intra or extrahepatic biliary ductal dilatation.  Stable appearance of the kidneys. The left kidney demonstrates diffuse renal cortical thinning, hydronephrosis and extensive urothelial thickening. A double-J ureteral stent is present in in unchanged position. There is nonspecific left greater than right perinephric stranding. On the right, loculated air are again noted in the upper pole collecting system. Stable right upper pole renal cortical cyst.  Partial right hemicolectomy with patent ileocolonic anastomosis in the right hemi abdomen. No definite free fluid or suspicious adenopathy.  Pelvis: Left double-J ureteral stents in the bladder along with a Foley catheter. Air is also noted in the bladder lumen. The bladder wall is slightly thickened and hyper enhancing suggesting cystitis. Surgical clips noted along the right pelvic sidewall. Surgical changes of prior hysterectomy.  Bones/Soft Tissues: No acute fracture or aggressive appearing lytic or blastic osseous lesion. Subacute to remote fracture of the lateral left tenth rib.  Review of the MIP images confirms the above findings.  IMPRESSION: CTA CHEST  1. No acute vascular abnormality or potential source lesion to explain the patient's left lower extremity embolic event. 2. Mild atherosclerotic plaque including coronary artery disease. 3. Mild centrilobular pulmonary emphysema. 4. Interval development of trace bilateral pleural effusions and mild dependent lower lobe atelectasis. Otherwise, the lungs are clear.  CTA ABD/PELVIS  1. Comparison of today's CTA compared to the recent CTA chest/abdomen/pelvis performed at an outside institution on 01/07/2014 and  demonstrates an acute difference in the appearance of a heterogeneous irregular plaque in the distal left common iliac artery just proximal to the bifurcation. On the prior study, there was an irregular plaque with adherent mural thrombus resulting in at least 75% narrowing of the vessel lumen. On the present study, there is only a relatively smooth residual plaque resulting in minimal stenosis. This almost certainly represents the source of the patient's left lower extremity embolic disease. 2. Somewhat irregular mottled appearance of the spleen appear significantly more prominent than seen on the recent prior CTA. While this is strongly favored to simply represent differences in arterial phase  timing and splenic parenchymal perfusion, the possibility of lower pole splenic infarcts is not excluded entirely. Recommend clinical correlation for for history of acute left upper quadrant pain. 3. Scattered atherosclerotic vascular disease as detailed above without evidence of acute dissection, aneurysm, ulcerative plaque or wall adherent thrombus. 4. Moderate stenoses in mid segments of the bilateral common iliac arteries. 5. Moderate to high-grade focal stenosis of the origin of the inferior mesenteric artery. 6. Stable position of a left-sided double-J ureteral stent with persistent left hydronephrosis and diffuse urothelial thickening and hyper enhancement extending from the renal collecting system to the bladder. Findings raise concern for acute, or chronic infection/inflammation. 7. Additional ancillary findings as above without significant interval change compared to recent prior imaging. Signed,  Criselda Peaches, MD  [facility]  [facility]   Electronically Signed   By: Jacqulynn Cadet M.D.   On: 01/19/2014 11:35   Ct Angio Abd/pel W/ And/or W/o  01/19/2014   CLINICAL DATA:  Left foot embolic disease, evaluate aorta for potential source lesion.  EXAM: CT  ANGIOGRAPHY CHEST, ABDOMEN AND PELVIS  TECHNIQUE: Multidetector CT imaging through the chest, abdomen and pelvis was performed using the standard protocol during bolus administration of intravenous contrast. Multiplanar reconstructed images and MIPs were obtained and reviewed to evaluate the vascular anatomy.  CONTRAST:  68mL OMNIPAQUE IOHEXOL 350 MG/ML SOLN  COMPARISON:  CTA chest, abdomen and pelvis performed 01/07/2014 at [facility].  FINDINGS: CTA CHEST FINDINGS  Mediastinum: Unremarkable CT appearance of the thyroid gland. No suspicious mediastinal or hilar adenopathy. No soft tissue mediastinal mass. The thoracic esophagus is unremarkable.  Heart/Vascular: Conventional 3 vessel aortic arch. No aneurysmal dilatation, acute dissection, irregular plaque or wall adherent mural thrombus. Mild atherosclerotic calcification noted at the right coronary artery origin. The heart is within normal limits for size. No pericardial effusion. The left atrial appendage is well opacified. No evidence of thrombus. No atrial or ventricular thrombus identified.  Lungs/Pleura: Mild centrilobular emphysema. No suspicious pulmonary nodules. Mild biapical pleural parenchymal scarring. Trace dependent atelectasis and small bilateral effusions. The lungs are otherwise clear.  Bones/Soft Tissues: No acute fracture or aggressive appearing lytic or blastic osseous lesion.  Review of the MIP images confirms the above findings.  CTA ABDOMEN AND PELVIS FINDINGS  VASCULAR  Aorta: Scattered heterogeneous but smooth atherosclerotic plaque throughout the normal caliber abdominal aorta. No evidence of aneurysmal dilatation, plaque ulceration or wall adherent mural thrombus. The distal aorta is relatively small in caliber.  Celiac: Widely patent.  Conventional hepatic arterial anatomy.  SMA: Widely patent.  Renals: Single dominant renal arteries bilaterally. Mild atherosclerotic plaque results in mild narrowing of both arteries. The the left  renal artery is slightly atretic.  IMA: Non calcified atherosclerotic plaque results and a moderate to high-grade stenosis at the origin of the inferior mesenteric artery.  Inflow: Heterogeneous atherosclerotic plaque. There are moderate bilateral common iliac artery stenoses. On the left, just before the common iliac bifurcation there is a small focal partially calcified and non calcified atherosclerotic plaque. Correlation with the recent prior CTA study reveals that there was a larger ulcerative plaque with some adherent mural thrombus at this location which is no longer evident. This is almost certainly the source of the patient's embolic disease. The bilateral internal iliac arteries are patent.  Proximal Outflow: Mild heterogeneous plaque along the posterior wall of the bilateral common femoral arteries without significant stenosis. The proximal visualize superficial and profunda femoral arteries are patent and relatively  disease free.  Veins: No focal venous abnormality given the limitations of non venous phase timing.  NON-VASCULAR  Abdomen: Unremarkable CT appearance of the stomach, duodenum, and pancreas. Abnormal mottled appearance of the spleen. Some of the mottling is similar to that seen on the prior study, however there is accentuation extending nearly to the cortex in the anterior and lower pole. This could simply represent normal splenic arterial profusion pattern, however the possibility of small splenic infarcts is not excluded entirely. There are several punctate calcifications which are unchanged compared to prior and likely reflecting the sequelae of prior granulomatous disease.  No suspicious hepatic lesion. Normal hepatic morphology and contour. There are 2 small calcified granulomas. Gallbladder is unremarkable. No intra or extrahepatic biliary ductal dilatation.  Stable appearance of the kidneys. The left kidney demonstrates diffuse renal cortical thinning, hydronephrosis and extensive  urothelial thickening. A double-J ureteral stent is present in in unchanged position. There is nonspecific left greater than right perinephric stranding. On the right, loculated air are again noted in the upper pole collecting system. Stable right upper pole renal cortical cyst.  Partial right hemicolectomy with patent ileocolonic anastomosis in the right hemi abdomen. No definite free fluid or suspicious adenopathy.  Pelvis: Left double-J ureteral stents in the bladder along with a Foley catheter. Air is also noted in the bladder lumen. The bladder wall is slightly thickened and hyper enhancing suggesting cystitis. Surgical clips noted along the right pelvic sidewall. Surgical changes of prior hysterectomy.  Bones/Soft Tissues: No acute fracture or aggressive appearing lytic or blastic osseous lesion. Subacute to remote fracture of the lateral left tenth rib.  Review of the MIP images confirms the above findings.  IMPRESSION: CTA CHEST  1. No acute vascular abnormality or potential source lesion to explain the patient's left lower extremity embolic event. 2. Mild atherosclerotic plaque including coronary artery disease. 3. Mild centrilobular pulmonary emphysema. 4. Interval development of trace bilateral pleural effusions and mild dependent lower lobe atelectasis. Otherwise, the lungs are clear.  CTA ABD/PELVIS  1. Comparison of today's CTA compared to the recent CTA chest/abdomen/pelvis performed at an outside institution on 01/07/2014 and demonstrates an acute difference in the appearance of a heterogeneous irregular plaque in the distal left common iliac artery just proximal to the bifurcation. On the prior study, there was an irregular plaque with adherent mural thrombus resulting in at least 75% narrowing of the vessel lumen. On the present study, there is only a relatively smooth residual plaque resulting in minimal stenosis. This almost certainly represents the source of the patient's left lower extremity  embolic disease. 2. Somewhat irregular mottled appearance of the spleen appear significantly more prominent than seen on the recent prior CTA. While this is strongly favored to simply represent differences in arterial phase timing and splenic parenchymal perfusion, the possibility of lower pole splenic infarcts is not excluded entirely. Recommend clinical correlation for for history of acute left upper quadrant pain. 3. Scattered atherosclerotic vascular disease as detailed above without evidence of acute dissection, aneurysm, ulcerative plaque or wall adherent thrombus. 4. Moderate stenoses in mid segments of the bilateral common iliac arteries. 5. Moderate to high-grade focal stenosis of the origin of the inferior mesenteric artery. 6. Stable position of a left-sided double-J ureteral stent with persistent left hydronephrosis and diffuse urothelial thickening and hyper enhancement extending from the renal collecting system to the bladder. Findings raise concern for acute, or chronic infection/inflammation. 7. Additional ancillary findings as above without significant interval change compared to recent  prior imaging. Signed,  Criselda Peaches, MD  [facility]  [facility]   Electronically Signed   By: Jacqulynn Cadet M.D.   On: 01/19/2014 11:35    Microbiology: Recent Results (from the past 240 hour(s))  URINE CULTURE     Status: None   Collection Time    01/20/14  8:24 AM      Result Value Ref Range Status   Specimen Description URINE, RANDOM   Final   Special Requests ADDED [id number]   Final   Culture  Setup Time     Final   Value: 01/21/2014 01:48     Performed at [place]     Final   Value: NO GROWTH     Performed at [facility]   Culture     Final   Value: NO GROWTH     Performed at [facility]   Report Status 01/21/2014 FINAL   Final  MRSA PCR SCREENING     Status: None   Collection Time     01/20/14  3:38 PM      Result Value Ref Range Status   MRSA by PCR NEGATIVE  NEGATIVE Final   Comment:            The GeneXpert MRSA Assay (FDA     approved for NASAL specimens     only), is one component of a     comprehensive MRSA colonization     surveillance program. It is not     intended to diagnose MRSA     infection nor to guide or     monitor treatment for     MRSA infections.     Labs: Basic Metabolic Panel:  Recent Labs Lab 01/23/14 0308 01/24/14 0445 01/25/14 0115 01/26/14 0315 01/27/14 0415  NA 144 142 138 137 139  K 4.3 3.9 4.1 4.1 4.0  CL 112 108 104 104 107  CO2 21 21 23 24 24   GLUCOSE 99 131* 106* 100* 96  BUN 13 12 13 10 8   CREATININE 1.09 1.07 1.04 1.09 1.02  CALCIUM 8.2* 7.8* 7.9* 7.8* 8.2*   Liver Function Tests: No results found for this basename: AST, ALT, ALKPHOS, BILITOT, PROT, ALBUMIN,  in the last 168 hours No results found for this basename: LIPASE, AMYLASE,  in the last 168 hours No results found for this basename: AMMONIA,  in the last 168 hours CBC:  Recent Labs Lab 01/22/14 0440 01/23/14 0850 01/25/14 0115 01/26/14 0315 01/27/14 0415  WBC 6.2 7.2 7.3 8.8 6.0  HGB 7.7* 10.0* 8.3* 7.5* 7.5*  HCT 23.0* 30.4* 25.8* 23.5* 22.8*  MCV 86.1 86.9 88.4 88.7 87.7  PLT 112* 119* 90* 75* 75*   Cardiac Enzymes: No results found for this basename: CKTOTAL, CKMB, CKMBINDEX, TROPONINI,  in the last 168 hours BNP: BNP (last 3 results) No results found for this basename: PROBNP,  in the last 8760 hours CBG: No results found for this basename: GLUCAP,  in the last 168 hours     Signed:  Kinnie Feil  [facility] 01/27/2014, 11:47 AM

## 2014-01-27 NOTE — Progress Notes (Signed)
Physical Therapy Treatment Patient Details Name: [name] MRN: [id number] DOB: October 20, 1951 Today's Date: 01/27/2014 Time: [id number] PT Time Calculation (min): 27 min  PT Assessment / Plan / Recommendation  History of Present Illness [name] is a 63 y.o. female presenting on 01/17/2014 with  has a past medical history of Renal disorder; Subacute delirium (10/14/2013); Alzheimer's dementia; Toxic encephalopathy; and UTI who presented to [facility] with left foot pain and was found to have ischemia.   PT Comments   Pt s/p angiogram and stent placement yesterday.  Spoke with surgeon and he verbally okayed to resume PT with no restrictions.  Pt did well with RW and was able to ambulate much further today.  Follow Up Recommendations  Supervision/Assistance - 24 hour;Other (comment);SNF     Does the patient have the potential to tolerate intense rehabilitation     Barriers to Discharge        Equipment Recommendations  Rolling walker with 5" wheels    Recommendations for Other Services    Frequency Min 3X/week   Progress towards PT Goals Progress towards PT goals: Progressing toward goals  Plan Current plan remains appropriate    Precautions / Restrictions Precautions Precautions: Fall Precaution Comments: pt with dementia  Restrictions Weight Bearing Restrictions: No   Pertinent Vitals/Pain Pt with Moderate pain in L foot and groin.  Nursing aware.    Mobility  Bed Mobility Overal bed mobility: Modified Independent Transfers Overall transfer level: Needs assistance Equipment used: Rolling walker (2 wheeled) Transfers: Sit to/from Stand Sit to Stand: Min guard (from bed and BSC) General transfer comment: cues for hand placement and safety; pt impulsive with transfers; (A) to maintain balance  Ambulation/Gait Ambulation/Gait assistance: Min guard Ambulation Distance (Feet): 125 Feet Assistive device: Rolling walker (2 wheeled) Gait Pattern/deviations: Decreased stance time -  left;Antalgic General Gait Details:  (cues for posture and stepping up closer into RW)    Exercises     PT Diagnosis:    PT Problem List:   PT Treatment Interventions:     PT Goals (current goals can now be found in the care plan section)    Visit Information  Last PT Received On: 01/27/14 Assistance Needed: +1 History of Present Illness: [name] is a 63 y.o. female presenting on 01/17/2014 with  has a past medical history of Renal disorder; Subacute delirium (10/14/2013); Alzheimer's dementia; Toxic encephalopathy; and UTI who presented to [facility] with left foot pain and was found to have ischemia.    Subjective Data      Cognition  Cognition Arousal/Alertness: Awake/alert Behavior During Therapy: Anxious Overall Cognitive Status: History of cognitive impairments - at baseline Memory: Decreased short-term memory    Balance  Balance Standing balance support: No upper extremity supported Standing balance-Leahy Scale: Fair Standing balance comment: standing at [facility] without AD.  Needs AD for safety.  End of Session PT - End of Session Equipment Utilized During Treatment: Gait belt Activity Tolerance: Patient tolerated treatment well Patient left: in chair;with call bell/phone within reach (nursing aware) Nurse Communication: Mobility status;Other (comment) (BM in [facility] for testing)   GP     [name] 01/27/2014, 11:32 AM

## 2014-01-27 NOTE — Progress Notes (Signed)
   Daily Progress Note  Assessment/Planning: POD #1 s/p L CIA PTA+S   L CIA stent: treated with covered stent, cont Plavix to maintain patency of stent  Left foot thromboembolism: I suspect the great toe and 4th toe are likely to survive, the distal phalange of the 5th toe might eventually need amputation.  I would continue with local wound care to the left foot: wash the left foot bid with soap and water.  I would plan on one month observation before making a decision.  Thrombocytopenia: HIT panel ordered.  Along with decreasing H/H, I won't continue anticoagulation until anemia and thrombocytopenia is fully worked up  Anemia: drifting H/H, unclear if Heparin or other etiology, defer to IM  From vascular viewpoint, pt can be discharged if work-up for anemia and thrombocytopenia can be safely transferred to PCP  Patent can follow up in my office in one month.  Subjective  - 1 Day Post-Op  No pain from groin stick  Objective Filed Vitals:   01/26/14 0930 01/26/14 1500 01/26/14 2024 01/27/14 0617  BP:  139/55 106/71 127/64  Pulse: 73 71 79 85  Temp:  97.3 F (36.3 C) 98.4 F (36.9 C) 98.1 F (36.7 C)  TempSrc:   Oral Oral  Resp: 16 16 16 16   Height:      Weight:      SpO2: 100% 100% 99% 99%    Intake/Output Summary (Last 24 hours) at 01/27/14 0829 Last data filed at 01/27/14 0659  Gross per 24 hour  Intake   2275 ml  Output   1350 ml  Net    925 ml    PULM  CTAB CV  RRR GI  soft, NTND VASC  L groin: echymosis unchanged from preprocedure, no PSA, unchg hematoma from preproc, L foot: great toe with some blacken skin, mostly viable; 4th toe: blacken skin over tip, mostly viable, 5th toe: black eschar at the distal phalange of 5th toe, unknown viability  Laboratory CBC    Component Value Date/Time   WBC 6.0 01/27/2014 0415   HGB 7.5* 01/27/2014 0415   HCT 22.8* 01/27/2014 0415   PLT 75* 01/27/2014 0415    BMET    Component Value Date/Time   NA 139 01/27/2014 0415    K 4.0 01/27/2014 0415   CL 107 01/27/2014 0415   CO2 24 01/27/2014 0415   GLUCOSE 96 01/27/2014 0415   BUN 8 01/27/2014 0415   CREATININE 1.02 01/27/2014 0415   CALCIUM 8.2* 01/27/2014 0415   GFRNONAA 58* 01/27/2014 [place]* 01/27/2014 [place], MD [facility] [place]: [phone] Pager: [phone]  01/27/2014, 8:29 AM

## 2014-01-27 NOTE — Care Management Note (Signed)
CARE MANAGEMENT NOTE 01/27/2014  Patient:  [facility]   Account Number:  [id number]  Date Initiated:  01/21/2014  Documentation initiated by:  Marvetta Gibbons  Subjective/Objective Assessment:   Pt admitted left foot pain- with limb ischemia     Action/Plan:   PTA pt lived at home with spouse- NCM to follow for d/c needs   Anticipated DC Date:  01/27/2014   Anticipated DC Plan:  [place]  CM consult      [facility] Choice  [place]   Choice offered to / List presented to:  C-1 Patient   DME arranged  Albertha Ghee      DME agency  [place] arranged  [place] RN      [place].   Status of service:  Completed, signed off Medicare Important Message given?   (If response is "NO", the following Medicare IM given date fields will be blank) Date Medicare IM given:   Date Additional Medicare IM given:    Discharge Disposition:  [place]

## 2014-01-28 ENCOUNTER — Telehealth: Payer: Self-pay | Admitting: Vascular Surgery

## 2014-01-28 LAB — HEPARIN INDUCED THROMBOCYTOPENIA PNL
Heparin Induced Plt Ab: NEGATIVE
Patient O.D.: 0.106
UFH High Dose UFH H: 0 % Release
UFH Low Dose 0.1 IU/mL: 0 % Release
UFH Low Dose 0.5 IU/mL: 0 % Release
UFH SRA RESULT: NEGATIVE

## 2014-01-28 NOTE — Telephone Encounter (Signed)
Message copied by Gena Fray on Thu Jan 28, 2014  1:53 PM ------      Message from: Peter Minium K      Created: Thu Jan 28, 2014 11:01 AM      Regarding: RE: Schedule       How about 2 weeks, in [facility]'s note from 01-27-14 you may already have her down for 4 weeks.       ----- Message -----         From: Gena Fray         Sent: 01/28/2014  10:23 AM           To: Mena Goes, CMA      Subject: RE: Schedule                                             Dr Daphene Calamity out in 1 week. Vinnie Level?            Hinton Dyer      ----- Message -----         From: Mena Goes, CMA         Sent: 01/27/2014   2:41 PM           To: [facility] Admin Pool      Subject: Schedule                                                             ----- Message -----         From: Conrad [place], MD         Sent: 01/27/2014   2:07 PM           To: [facility] Charge [address]            ANNALEY FISHBAUGH      [id number]      1951/09/11            Follow up in 1 week            ? R1 charge             ------

## 2014-01-28 NOTE — Telephone Encounter (Addendum)
Message copied by Gena Fray on Thu Jan 28, 2014 10:23 AM ------      Message from: Peter Minium K      Created: Wed Jan 27, 2014  8:58 AM      Regarding: Schedule                   ----- Message -----         From: Gabriel Earing, PA-C         Sent: 01/27/2014   8:43 AM           To: [facility] Charge Pool            S/p      1.  Left common femoral artery cannulation under ultrasound guidance      2.  Left pelvic angiogram      3.  Left common iliac artery stenting and angioplasty (iCAST 7 mm x 38 mm)            On 01/26/14.              F/u with Dr. Bridgett Larsson in 4 weeks.            Thanks,      Samantha ------  01/27/14: lm for pt re appt, dpm

## 2014-01-29 ENCOUNTER — Telehealth: Payer: Self-pay

## 2014-01-29 ENCOUNTER — Encounter (HOSPITAL_COMMUNITY): Payer: Self-pay | Admitting: Vascular Surgery

## 2014-01-29 DIAGNOSIS — I743 Embolism and thrombosis of arteries of the lower extremities: Secondary | ICD-10-CM

## 2014-01-29 MED ORDER — OXYCODONE-ACETAMINOPHEN 5-325 MG PO TABS: ORAL_TABLET | ORAL | Status: DC

## 2014-01-29 NOTE — Telephone Encounter (Signed)
Rec'd call from pt's husband.  Reported she was only given RX for 12 tabs of Percocet 5/325 mg upon discharge 2/18.  Stated she c/o ongoing pain in left foot.  Reported that there is pain and swelling in the top of left foot, and the 4th & 5th toes.  Stated pt. isn't able to sleep at night.  Discussed with Dr. Bridgett Larsson.  Recommends to give pt. Percocet 5/325mg , 1 tab po., q 4-6 hrs/ prn, # 50, no refills.  Husband notified of Rx to be available for picking up at the office.  Agrees to plan.

## 2014-02-01 ENCOUNTER — Ambulatory Visit (INDEPENDENT_AMBULATORY_CARE_PROVIDER_SITE_OTHER): Payer: Self-pay | Admitting: Family

## 2014-02-01 ENCOUNTER — Encounter: Payer: Self-pay | Admitting: Family

## 2014-02-01 ENCOUNTER — Ambulatory Visit (INDEPENDENT_AMBULATORY_CARE_PROVIDER_SITE_OTHER)
Admission: RE | Admit: 2014-02-01 | Discharge: 2014-02-01 | Disposition: A | Discharge status: Home / Self Care | Payer: Medicare Other | Source: Ambulatory Visit | Attending: Family | Admitting: Family

## 2014-02-01 ENCOUNTER — Telehealth: Payer: Self-pay

## 2014-02-01 ENCOUNTER — Ambulatory Visit (HOSPITAL_COMMUNITY)
Admission: RE | Admit: 2014-02-01 | Discharge: 2014-02-01 | Disposition: A | Discharge status: Home / Self Care | Payer: Medicare Other | Source: Ambulatory Visit | Attending: Family | Admitting: Family

## 2014-02-01 ENCOUNTER — Encounter: Payer: Self-pay | Admitting: *Deleted

## 2014-02-01 VITALS — BP 157/72 | HR 78 | Temp 97.6°F | Resp 16 | Ht 61.0 in | Wt 87.2 lb

## 2014-02-01 DIAGNOSIS — I749 Embolism and thrombosis of unspecified artery: Secondary | ICD-10-CM

## 2014-02-01 DIAGNOSIS — M7989 Other specified soft tissue disorders: Secondary | ICD-10-CM

## 2014-02-01 DIAGNOSIS — I743 Embolism and thrombosis of arteries of the lower extremities: Secondary | ICD-10-CM

## 2014-02-01 DIAGNOSIS — Z95828 Presence of other vascular implants and grafts: Secondary | ICD-10-CM

## 2014-02-01 DIAGNOSIS — Z09 Encounter for follow-up examination after completed treatment for conditions other than malignant neoplasm: Secondary | ICD-10-CM | POA: Insufficient documentation

## 2014-02-01 DIAGNOSIS — Z9889 Other specified postprocedural states: Secondary | ICD-10-CM

## 2014-02-01 DIAGNOSIS — M79609 Pain in unspecified limb: Secondary | ICD-10-CM

## 2014-02-01 NOTE — Addendum Note (Signed)
Addendum created 02/01/14 1344 by Kate Sable, MD   Modules edited: Anesthesia Attestations, Anesthesia Events

## 2014-02-01 NOTE — Telephone Encounter (Signed)
Husband called to report pt's worsening pain in the left foot throughout the weekend.  Reported she has not slept since Friday, due to the amount of pain she is in.  Also reported the left foot is more swollen.  Stated there is small amt. Drainage between the 4th and 5th toes, which is new.  Discussed with Dr. Trula Slade.  Recommended to bring in today for Duplex of the stent Left CIA, and ABI's.  Husband notified; appt. given for 2:00 PM today.

## 2014-02-01 NOTE — Progress Notes (Signed)
[facility] [place] HISTORY AND PHYSICAL -PAD  History of Present Illness [name] is a 63 y.o. female patient who is status post left common iliac artery stent placement on 01/26/2014 by Dr. Bridgett Larsson. She comes in today with CC of worsening pain in left foot in the last 2 days. She states Dr. Trula Slade told her the left 4th and 5th toes need to be removed, states she is more than ready for this to happen due to the pain in the entire left foot.  The tips of her first, 4th, and 5th toes have been black since April, 2014, getting worse, per pt. When she stands, pain travels up her left left from her left foot. She reports a small amount of drainage from gangrenous left 4th and 5th toes at times, serrous, not purulent. Her right foot does not bother her 5 mg oxycodone every 4 hours does not adequately address the pain, per pt., she cannot sleep due to the pain.  She denies any cardiac problems, denies any history of stroke or TIA.  Pt Diabetic: No Pt smoker: former smoker, quit February, 2015  Pt meds include: Statin :Yes Betablocker:Yes ASA: No Other anticoagulants/antiplatelets: Plavix  Past Medical History  Diagnosis Date  . Renal disorder     kidney stones  . Subacute delirium 10/14/2013  . Alzheimer's dementia   . Toxic encephalopathy   . UTI (lower urinary tract infection)   . COPD (chronic obstructive pulmonary disease)     Social History History  Substance Use Topics  . Smoking status: Former Smoker -- 1.00 packs/day    Types: Cigarettes  . Smokeless tobacco: Never Used  . Alcohol Use: No    Family History Family History  Problem Relation Age of Onset  . Cancer Brother     Past Surgical History  Procedure Laterality Date  . Abdominal hysterectomy    . Knee surgery    . Bladder surgery    . Insertion of iliac stent Left 01/26/2014    Procedure: INSERTION OF ILIAC STENT- LEFT COMMON ILIAC ARTERY STENT ;  Surgeon: Conrad Juana Di­az, MD;   Location: [place];  Service: Vascular;  Laterality: Left;  . Cholecystectomy  1995    Gall Bladder    Allergies  Allergen Reactions  . Codeine Nausea And Vomiting    REACTION: intolerance    Current Outpatient Prescriptions  Medication Sig Dispense Refill  . amLODipine (NORVASC) 5 MG tablet Take 5 mg by mouth daily.      Marland Kitchen atorvastatin (LIPITOR) 10 MG tablet daily.      . carvedilol (COREG) 6.25 MG tablet daily.      . clopidogrel (PLAVIX) 75 MG tablet Take 1 tablet (75 mg total) by mouth daily with breakfast.  30 tablet  1  . donepezil (ARICEPT) 10 MG tablet Take 1 tablet (10 mg total) by mouth at bedtime.  90 tablet  1  . gabapentin (NEURONTIN) 100 MG capsule 200 mg 3 (three) times daily.      . magnesium oxide (MAG-OX) 400 MG tablet Take 400 mg by mouth daily.      . metoprolol tartrate (LOPRESSOR) 25 MG tablet Take 25 mg by mouth 2 (two) times daily.      Marland Kitchen oxyCODONE (OXY IR/ROXICODONE) 5 MG immediate release tablet every 1 hour x 4 doses.      Marland Kitchen oxyCODONE-acetaminophen (PERCOCET/ROXICET) 5-325 MG per tablet Take 1-2 tabs every 4-6 hrs/ prn/ pain  50 tablet  0  . phenytoin (  DILANTIN) 100 MG ER capsule Take 2 capsules (200 mg total) by mouth at bedtime.  180 capsule  1  . QUEtiapine (SEROQUEL) 25 MG tablet Take 1 tablet (25 mg total) by mouth every evening.  90 tablet  1  . traMADol (ULTRAM-ER) 100 MG 24 hr tablet Take 100 mg by mouth 4 (four) times daily.      Marland Kitchen zolpidem (AMBIEN) 10 MG tablet Take 1 tablet (10 mg total) by mouth at bedtime as needed for sleep. Take one half tab by mouth prn insomnia.  30 tablet  0   No current facility-administered medications for this visit.    ROS: See HPI for pertinent positives and negatives.   Physical Examination  Filed Vitals:   02/01/14 1553  BP: 157/72  Pulse: 78  Temp: 97.6 F (36.4 C)  Resp: 16   Filed Weights   02/01/14 1553  Weight: 87 lb 3.2 oz (39.554 kg)   Body mass index is 16.48 kg/(m^2).  General: A&O x 3,  WDWN. Gait: limp Eyes: PERRLA. Pulmonary: CTAB, without wheezes , rales or rhonchi. Cardiac: regular Rythm , without detected murmur.         Carotid Bruits Left Right   Negative Negative  Aorta is not palpable. Radial pulses: are 2+ palpable and equal.                           VASCULAR EXAM: Extremities with ischemic changes: left 4th and 5th tips of toes are gangrenous, the tip of left great toe  ; without open wounds.                                                                                                          LE Pulses LEFT RIGHT       FEMORAL   palpable   palpable        POPLITEAL  not palpable   not palpable       POSTERIOR TIBIAL  not palpable   not palpable        DORSALIS PEDIS      ANTERIOR TIBIAL not palpable  not palpable        PERONEAL not Palpable   not Palpable    Abdomen: soft, NT, no masses. Skin: no rashes, no ulcers noted. Musculoskeletal: no muscle wasting or atrophy.  Neurologic: A&O X 3; Appropriate Affect ; SENSATION: normal; MOTOR FUNCTION:  moving all extremities equally, motor strength 5/5 in arms and right leg, 4/5 in left leg. Speech is fluent/normal. CN 2-12 intact.  Non-Invasive Vascular Imaging: DATE: 02/01/2014  DUPLEX SCAN OF BYPASS:   [facility]ORTO - ILIAC DUPLEX EVALUATION    INDICATION: Iliac stent    PREVIOUS INTERVENTION(S): Left common iliac artery stent on 01/26/14    DUPLEX EXAM:      Peak Systolic Velocity (cm/s)  AORTA - Proximal 39  AORTA - Mid 69  AORTA - Distal Not Visualized     RIGHT  LEFT  Peak Systolic Velocity (cm/s) Ratio (if abnormal) Waveform  Peak  Systolic Velocity (cm/s) Ratio (if abnormal) Waveform  125  T Common Iliac Artery - Proximal     Not Visualized    Common Iliac Artery - Mid     Not Visualized    Common Iliac Artery - Distal     126  T External Iliac Artery - Proximal     136  T External Iliac Artery - Mid     147  T External Iliac Artery - Distal     Not Visualized    Internal Iliac  Artery     1.01 Today's ABI / TBI 1.03  1.2 Previous ABI / TBI (01/18/14 at [facility] ) 1.18    Waveform:    M - Monophasic       B - Biphasic       T - Triphasic  If Ankle Brachial Index (ABI) or Toe Brachial Index (TBI) performed, please see complete report     ADDITIONAL FINDINGS: Limited visualization of the abdominal vasculature and stent due to overlying bowel gas and patient body habitus.  Patient's exam was performed in the late afternoon.      IMPRESSION: Patent left common iliac artery stent with no hemodynamically significant stenosis of the left iliac arteries noted, based on limited visualization, as described above. No previous duplex exam available for comparison       ASSESSMENT: ESMERALDO MAYHALL is a 63 y.o. female who is status post left common iliac artery stent placement on 01/26/2014 presents with extremely painful left foot, increasingly so over the last 2 days. Most painful are her left 4th and 5th toes, and the lateral/dorsal aspect of her left foot. The tips of her left 4th and 5th toes are black, devoid of perfusion. The tip of her left great toe is mottled with black coloration and is not painful.  Her ABI's are normal, no evidence of ischemia in either leg to the ankles, bilateral great toe pressures are abnormal, but do not indicate critical ischemia.  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   PLAN:  Dr. Trula Slade spoke with patient and examined her foot. Scheduled amputation left 4th and 5th toes with Dr. Bridgett Larsson, Monday, February 08, 2014. On 01/29/2014 she was given a prescription for oxycodone-acetaminophen, 5-325, #50 tablets, 0 refills, sig: take 1-2 tablets every 4 hours as needed for pain, which should be enough to last her for a week, until her surgery, she will call if she needs more.  Clemon Chambers, RN, MSN, FNP-C [facility] Phone: [phone]  Clinic MD: Trula Slade  02/01/2014  4:02 PM

## 2014-02-01 NOTE — Telephone Encounter (Signed)
Husband called back and planning on trying to get pt. seen today; stated will attempt to get her here by 3:00 PM.

## 2014-02-01 NOTE — Telephone Encounter (Signed)
Pt's. Husband called back and stated there is no transportation to get her to the office today.  Requesting to schedule appt. on another day; prefers an early AM appt. Will have an office scheduler contact pt's husband.

## 2014-02-02 ENCOUNTER — Other Ambulatory Visit: Payer: Self-pay | Admitting: *Deleted

## 2014-02-03 ENCOUNTER — Encounter (HOSPITAL_COMMUNITY): Payer: Self-pay | Admitting: Pharmacy Technician

## 2014-02-05 ENCOUNTER — Encounter (HOSPITAL_COMMUNITY)
Admission: RE | Admit: 2014-02-05 | Discharge: 2014-02-05 | Disposition: A | Discharge status: Home / Self Care | Payer: Medicare Other | Source: Ambulatory Visit | Attending: Vascular Surgery | Admitting: Vascular Surgery

## 2014-02-05 ENCOUNTER — Encounter (HOSPITAL_COMMUNITY)
Admission: RE | Admit: 2014-02-05 | Discharge: 2014-02-05 | Disposition: A | Discharge status: Home / Self Care | Payer: Medicare Other | Source: Ambulatory Visit | Attending: Anesthesiology | Admitting: Anesthesiology

## 2014-02-05 ENCOUNTER — Encounter (HOSPITAL_COMMUNITY): Payer: Self-pay

## 2014-02-05 HISTORY — DX: Hyperlipidemia, unspecified: E78.5

## 2014-02-05 HISTORY — DX: Personal history of Methicillin resistant Staphylococcus aureus infection: Z86.14

## 2014-02-05 HISTORY — DX: Insomnia, unspecified: G47.00

## 2014-02-05 HISTORY — DX: Personal history of other medical treatment: Z92.89

## 2014-02-05 HISTORY — DX: Essential (primary) hypertension: I10

## 2014-02-05 HISTORY — DX: Anemia, unspecified: D64.9

## 2014-02-05 LAB — CBC
HCT: 31.1 % — ABNORMAL LOW (ref 36.0–46.0)
HEMOGLOBIN: 9.7 g/dL — AB (ref 12.0–15.0)
MCH: 29.2 pg (ref 26.0–34.0)
MCHC: 31.2 g/dL (ref 30.0–36.0)
MCV: 93.7 fL (ref 78.0–100.0)
Platelets: 380 10*3/uL (ref 150–400)
RBC: 3.32 MIL/uL — AB (ref 3.87–5.11)
RDW: 17.8 % — ABNORMAL HIGH (ref 11.5–15.5)
WBC: 12.1 10*3/uL — ABNORMAL HIGH (ref 4.0–10.5)

## 2014-02-05 MED ORDER — CHLORHEXIDINE GLUCONATE CLOTH 2 % EX PADS: 6.0000 | MEDICATED_PAD | Freq: Once | CUTANEOUS | Status: DC

## 2014-02-05 NOTE — Progress Notes (Addendum)
    EKG in epic from 03-17-13  Echo report in epic from 01-18-14  Medical Md is Dr.Robert Dough

## 2014-02-05 NOTE — Progress Notes (Signed)
Dr. Oletta Lamas ( anesthesia) made aware that pt hgb is 9.7.

## 2014-02-05 NOTE — Pre-Procedure Instructions (Signed)
[name]  02/05/2014   Your procedure is scheduled on:  Mon, Mar 2 @ 7:30 AM  Report to [facility] Stay Entrance A  at 5:30 AM.  Call this number if you have problems the morning of surgery: [phone]   Remember:   Do not eat food or drink liquids after midnight.   Take these medicines the morning of surgery with A SIP OF WATER: Amlodipine(Norvasc),Carvedilol(Coreg),Gabapentin(Neurontin),Macrodantin(Nitrofurantoin),Pain Pill(if needed),and Tramadol(Ultram)               Stop taking your Plavix as instructed. No Goody's,BC's,Aleve,Ibuprofen,Fish Oil,or any Herbal Medications   Do not wear jewelry, make-up or nail polish.  Do not wear lotions, powders, or perfumes. You may wear deodorant.  Do not shave 48 hours prior to surgery. .  Do not bring valuables to the hospital.  [facility] is not responsible                  for any belongings or valuables.               Contacts, dentures or bridgework may not be worn into surgery.  Leave suitcase in the car. After surgery it may be brought to your room.  For patients admitted to the hospital, discharge time is determined by your                treatment team.               Patients discharged the day of surgery will not be allowed to drive  home.    Special Instructions:  [place] - Preparing for Surgery  Before surgery, you can play an important role.  Because skin is not sterile, your skin needs to be as free of germs as possible.  You can reduce the number of germs on you skin by washing with CHG (chlorahexidine gluconate) soap before surgery.  CHG is an antiseptic cleaner which kills germs and bonds with the skin to continue killing germs even after washing.  Please DO NOT use if you have an allergy to CHG or antibacterial soaps.  If your skin becomes reddened/irritated stop using the CHG and inform your nurse when you arrive at Short Stay.  Do not shave (including legs and underarms) for at least 48 hours prior to the  first CHG shower.  You may shave your face.  Please follow these instructions carefully:   1.  Shower with CHG Soap the night before surgery and the                                morning of Surgery.  2.  If you choose to wash your hair, wash your hair first as usual with your       normal shampoo.  3.  After you shampoo, rinse your hair and body thoroughly to remove the                      Shampoo.  4.  Use CHG as you would any other liquid soap.  You can apply chg directly       to the skin and wash gently with scrungie or a clean washcloth.  5.  Apply the CHG Soap to your body ONLY FROM THE NECK DOWN.        Do not use on open wounds or open sores.  Avoid contact with your eyes,  ears, mouth and genitals (private parts).  Wash genitals (private parts)       with your normal soap.  6.  Wash thoroughly, paying special attention to the area where your surgery        will be performed.  7.  Thoroughly rinse your body with warm water from the neck down.  8.  DO NOT shower/wash with your normal soap after using and rinsing off       the CHG Soap.  9.  Pat yourself dry with a clean towel.            10.  Wear clean pajamas.            11.  Place clean sheets on your bed the night of your first shower and do not        sleep with pets.  Day of Surgery  Do not apply any lotions/deoderants the morning of surgery.  Please wear clean clothes to the hospital/surgery center.     Please read over the following fact sheets that you were given: Pain Booklet, Coughing and Deep Breathing and Surgical Site Infection Prevention

## 2014-02-07 MED ORDER — DEXTROSE 5 % IV SOLN
1.5000 g | INTRAVENOUS | Status: AC
  Administered 2014-02-08: 1.5 g via INTRAVENOUS
  Filled 2014-02-07: qty 1.5

## 2014-02-08 ENCOUNTER — Encounter (HOSPITAL_COMMUNITY): Payer: Medicare Other | Admitting: Anesthesiology

## 2014-02-08 ENCOUNTER — Ambulatory Visit (HOSPITAL_COMMUNITY): Payer: Medicare Other | Admitting: Anesthesiology

## 2014-02-08 ENCOUNTER — Observation Stay (HOSPITAL_COMMUNITY)
Admission: RE | Admit: 2014-02-08 | Discharge: 2014-02-09 | Disposition: A | Discharge status: Home / Self Care | Payer: Medicare Other | Source: Ambulatory Visit | Attending: Vascular Surgery | Admitting: Vascular Surgery

## 2014-02-08 ENCOUNTER — Encounter (HOSPITAL_COMMUNITY): Payer: Self-pay | Admitting: *Deleted

## 2014-02-08 ENCOUNTER — Encounter (HOSPITAL_COMMUNITY)
Admission: RE | Disposition: A | Discharge status: Home / Self Care | Payer: Self-pay | Source: Ambulatory Visit | Attending: Vascular Surgery

## 2014-02-08 DIAGNOSIS — I96 Gangrene, not elsewhere classified: Secondary | ICD-10-CM | POA: Insufficient documentation

## 2014-02-08 DIAGNOSIS — E785 Hyperlipidemia, unspecified: Secondary | ICD-10-CM | POA: Insufficient documentation

## 2014-02-08 DIAGNOSIS — I743 Embolism and thrombosis of arteries of the lower extremities: Secondary | ICD-10-CM

## 2014-02-08 DIAGNOSIS — F0280 Dementia in other diseases classified elsewhere without behavioral disturbance: Secondary | ICD-10-CM | POA: Insufficient documentation

## 2014-02-08 DIAGNOSIS — G4700 Insomnia, unspecified: Secondary | ICD-10-CM | POA: Insufficient documentation

## 2014-02-08 DIAGNOSIS — D649 Anemia, unspecified: Secondary | ICD-10-CM | POA: Insufficient documentation

## 2014-02-08 DIAGNOSIS — I745 Embolism and thrombosis of iliac artery: Principal | ICD-10-CM | POA: Insufficient documentation

## 2014-02-08 DIAGNOSIS — Z8614 Personal history of Methicillin resistant Staphylococcus aureus infection: Secondary | ICD-10-CM | POA: Insufficient documentation

## 2014-02-08 DIAGNOSIS — I70229 Atherosclerosis of native arteries of extremities with rest pain, unspecified extremity: Secondary | ICD-10-CM

## 2014-02-08 DIAGNOSIS — G309 Alzheimer's disease, unspecified: Secondary | ICD-10-CM | POA: Insufficient documentation

## 2014-02-08 DIAGNOSIS — Z01818 Encounter for other preprocedural examination: Secondary | ICD-10-CM | POA: Insufficient documentation

## 2014-02-08 DIAGNOSIS — Z87891 Personal history of nicotine dependence: Secondary | ICD-10-CM | POA: Insufficient documentation

## 2014-02-08 DIAGNOSIS — I10 Essential (primary) hypertension: Secondary | ICD-10-CM | POA: Insufficient documentation

## 2014-02-08 DIAGNOSIS — Z792 Long term (current) use of antibiotics: Secondary | ICD-10-CM | POA: Insufficient documentation

## 2014-02-08 DIAGNOSIS — Z7902 Long term (current) use of antithrombotics/antiplatelets: Secondary | ICD-10-CM | POA: Insufficient documentation

## 2014-02-08 DIAGNOSIS — J449 Chronic obstructive pulmonary disease, unspecified: Secondary | ICD-10-CM | POA: Insufficient documentation

## 2014-02-08 DIAGNOSIS — I998 Other disorder of circulatory system: Secondary | ICD-10-CM

## 2014-02-08 DIAGNOSIS — Z01812 Encounter for preprocedural laboratory examination: Secondary | ICD-10-CM | POA: Insufficient documentation

## 2014-02-08 DIAGNOSIS — J4489 Other specified chronic obstructive pulmonary disease: Secondary | ICD-10-CM | POA: Insufficient documentation

## 2014-02-08 DIAGNOSIS — K219 Gastro-esophageal reflux disease without esophagitis: Secondary | ICD-10-CM | POA: Insufficient documentation

## 2014-02-08 HISTORY — PX: AMPUTATION: SHX166

## 2014-02-08 LAB — CREATININE, SERUM
CREATININE: 1 mg/dL (ref 0.50–1.10)
GFR calc Af Amer: 68 mL/min — ABNORMAL LOW (ref >90)
GFR calc non Af Amer: 59 mL/min — ABNORMAL LOW (ref >90)

## 2014-02-08 LAB — CBC
HCT: 31.4 % — ABNORMAL LOW (ref 36.0–46.0)
HEMOGLOBIN: 9.7 g/dL — AB (ref 12.0–15.0)
MCH: 28.8 pg (ref 26.0–34.0)
MCHC: 30.9 g/dL (ref 30.0–36.0)
MCV: 93.2 fL (ref 78.0–100.0)
Platelets: 246 10*3/uL (ref 150–400)
RBC: 3.37 MIL/uL — ABNORMAL LOW (ref 3.87–5.11)
RDW: 17.8 % — ABNORMAL HIGH (ref 11.5–15.5)
WBC: 10.6 10*3/uL — ABNORMAL HIGH (ref 4.0–10.5)

## 2014-02-08 SURGERY — AMPUTATION DIGIT
Anesthesia: General | Site: Toe | Laterality: Left

## 2014-02-08 MED ORDER — SODIUM CHLORIDE 0.9 % IV SOLN: 250.0000 mL | INTRAVENOUS | Status: DC | PRN

## 2014-02-08 MED ORDER — HYDROMORPHONE HCL PF 1 MG/ML IJ SOLN
0.5000 mg | INTRAMUSCULAR | Status: DC | PRN
  Administered 2014-02-08 – 2014-02-09 (×4): 1 mg via INTRAVENOUS
  Filled 2014-02-08 (×4): qty 1

## 2014-02-08 MED ORDER — MIDAZOLAM HCL 2 MG/2ML IJ SOLN
INTRAMUSCULAR | Status: AC
  Filled 2014-02-08: qty 2

## 2014-02-08 MED ORDER — SCOPOLAMINE 1 MG/3DAYS TD PT72
MEDICATED_PATCH | TRANSDERMAL | Status: AC
Start: 2014-02-08 — End: 2014-02-08
  Administered 2014-02-08: 1 via TRANSDERMAL
  Filled 2014-02-08: qty 1

## 2014-02-08 MED ORDER — DEXAMETHASONE SODIUM PHOSPHATE 4 MG/ML IJ SOLN
INTRAMUSCULAR | Status: AC
  Filled 2014-02-08: qty 2

## 2014-02-08 MED ORDER — OXYCODONE HCL 5 MG PO TABS: 5.0000 mg | ORAL_TABLET | Freq: Once | ORAL | Status: DC | PRN

## 2014-02-08 MED ORDER — ONDANSETRON HCL 4 MG/2ML IJ SOLN: 4.0000 mg | Freq: Four times a day (QID) | INTRAMUSCULAR | Status: DC | PRN

## 2014-02-08 MED ORDER — FENTANYL CITRATE 0.05 MG/ML IJ SOLN
INTRAMUSCULAR | Status: AC
  Filled 2014-02-08: qty 5

## 2014-02-08 MED ORDER — DEXAMETHASONE SODIUM PHOSPHATE 4 MG/ML IJ SOLN
INTRAMUSCULAR | Status: DC | PRN
  Administered 2014-02-08: 8 mg via INTRAVENOUS

## 2014-02-08 MED ORDER — HYDROMORPHONE HCL PF 1 MG/ML IJ SOLN
INTRAMUSCULAR | Status: AC
  Administered 2014-02-08: 0.5 mg via INTRAVENOUS
  Filled 2014-02-08: qty 2

## 2014-02-08 MED ORDER — METOPROLOL TARTRATE 1 MG/ML IV SOLN: 2.0000 mg | INTRAVENOUS | Status: DC | PRN

## 2014-02-08 MED ORDER — MIDAZOLAM HCL 5 MG/5ML IJ SOLN
INTRAMUSCULAR | Status: DC | PRN
  Administered 2014-02-08: 2 mg via INTRAVENOUS

## 2014-02-08 MED ORDER — PHENYTOIN SODIUM EXTENDED 100 MG PO CAPS
200.0000 mg | ORAL_CAPSULE | Freq: Every day | ORAL | Status: DC
  Administered 2014-02-08: 200 mg via ORAL
  Filled 2014-02-08 (×2): qty 2

## 2014-02-08 MED ORDER — PHENOL 1.4 % MT LIQD
1.0000 | OROMUCOSAL | Status: DC | PRN
  Filled 2014-02-08: qty 177

## 2014-02-08 MED ORDER — DEXTROSE 5 % IV SOLN
1.5000 g | Freq: Two times a day (BID) | INTRAVENOUS | Status: AC
  Administered 2014-02-08: 1.5 g via INTRAVENOUS
  Filled 2014-02-08: qty 1.5

## 2014-02-08 MED ORDER — SODIUM CHLORIDE 0.9 % IV SOLN
INTRAVENOUS | Status: DC | PRN
  Administered 2014-02-08: 07:00:00 via INTRAVENOUS

## 2014-02-08 MED ORDER — OXYCODONE HCL 5 MG/5ML PO SOLN: 5.0000 mg | Freq: Once | ORAL | Status: DC | PRN

## 2014-02-08 MED ORDER — HYDROMORPHONE HCL PF 1 MG/ML IJ SOLN
0.2500 mg | INTRAMUSCULAR | Status: DC | PRN
  Administered 2014-02-08 (×4): 0.5 mg via INTRAVENOUS

## 2014-02-08 MED ORDER — GUAIFENESIN-DM 100-10 MG/5ML PO SYRP
15.0000 mL | ORAL_SOLUTION | ORAL | Status: DC | PRN
Start: 2014-02-08 — End: 2014-02-09

## 2014-02-08 MED ORDER — ONDANSETRON HCL 4 MG/2ML IJ SOLN
INTRAMUSCULAR | Status: AC
  Filled 2014-02-08: qty 2

## 2014-02-08 MED ORDER — MAGNESIUM OXIDE 400 (241.3 MG) MG PO TABS
400.0000 mg | ORAL_TABLET | Freq: Three times a day (TID) | ORAL | Status: DC
  Administered 2014-02-08 – 2014-02-09 (×3): 400 mg via ORAL
  Filled 2014-02-08 (×5): qty 1

## 2014-02-08 MED ORDER — ENOXAPARIN SODIUM 40 MG/0.4ML ~~LOC~~ SOLN
40.0000 mg | SUBCUTANEOUS | Status: DC
  Administered 2014-02-09: 40 mg via SUBCUTANEOUS
  Filled 2014-02-08: qty 0.4

## 2014-02-08 MED ORDER — 0.9 % SODIUM CHLORIDE (POUR BTL) OPTIME
TOPICAL | Status: DC | PRN
  Administered 2014-02-08: 1000 mL

## 2014-02-08 MED ORDER — ONDANSETRON HCL 4 MG/2ML IJ SOLN
INTRAMUSCULAR | Status: DC | PRN
  Administered 2014-02-08: 4 mg via INTRAVENOUS

## 2014-02-08 MED ORDER — QUETIAPINE FUMARATE 25 MG PO TABS
25.0000 mg | ORAL_TABLET | Freq: Every day | ORAL | Status: DC
  Administered 2014-02-08: 25 mg via ORAL
  Filled 2014-02-08 (×2): qty 1

## 2014-02-08 MED ORDER — LIDOCAINE HCL (CARDIAC) 10 MG/ML IV SOLN
INTRAVENOUS | Status: DC | PRN
  Administered 2014-02-08: 100 mg via INTRAVENOUS

## 2014-02-08 MED ORDER — SODIUM CHLORIDE 0.9 % IJ SOLN
3.0000 mL | Freq: Two times a day (BID) | INTRAMUSCULAR | Status: DC
  Administered 2014-02-08 – 2014-02-09 (×2): 3 mL via INTRAVENOUS

## 2014-02-08 MED ORDER — PROPOFOL 10 MG/ML IV BOLUS
INTRAVENOUS | Status: AC
  Filled 2014-02-08: qty 20

## 2014-02-08 MED ORDER — CLOPIDOGREL BISULFATE 75 MG PO TABS
75.0000 mg | ORAL_TABLET | Freq: Every day | ORAL | Status: DC
  Administered 2014-02-09: 75 mg via ORAL
  Filled 2014-02-08 (×2): qty 1

## 2014-02-08 MED ORDER — OXYCODONE-ACETAMINOPHEN 5-325 MG PO TABS
1.0000 | ORAL_TABLET | ORAL | Status: DC | PRN
  Administered 2014-02-08 – 2014-02-09 (×6): 1 via ORAL
  Filled 2014-02-08 (×6): qty 1

## 2014-02-08 MED ORDER — PROPOFOL 10 MG/ML IV BOLUS
INTRAVENOUS | Status: DC | PRN
  Administered 2014-02-08: 150 mg via INTRAVENOUS

## 2014-02-08 MED ORDER — ZOLPIDEM TARTRATE 5 MG PO TABS
5.0000 mg | ORAL_TABLET | Freq: Every evening | ORAL | Status: DC | PRN
  Administered 2014-02-09: 5 mg via ORAL
  Filled 2014-02-08: qty 1

## 2014-02-08 MED ORDER — NITROFURANTOIN MACROCRYSTAL 100 MG PO CAPS
100.0000 mg | ORAL_CAPSULE | Freq: Every day | ORAL | Status: DC
  Administered 2014-02-08 – 2014-02-09 (×2): 100 mg via ORAL
  Filled 2014-02-08 (×2): qty 1

## 2014-02-08 MED ORDER — AMLODIPINE BESYLATE 5 MG PO TABS
5.0000 mg | ORAL_TABLET | Freq: Every day | ORAL | Status: DC
  Administered 2014-02-08 – 2014-02-09 (×2): 5 mg via ORAL
  Filled 2014-02-08 (×2): qty 1

## 2014-02-08 MED ORDER — ATORVASTATIN CALCIUM 10 MG PO TABS
10.0000 mg | ORAL_TABLET | Freq: Every day | ORAL | Status: DC
  Administered 2014-02-08 – 2014-02-09 (×2): 10 mg via ORAL
  Filled 2014-02-08 (×2): qty 1

## 2014-02-08 MED ORDER — GABAPENTIN 100 MG PO CAPS
200.0000 mg | ORAL_CAPSULE | Freq: Three times a day (TID) | ORAL | Status: DC
  Administered 2014-02-08 – 2014-02-09 (×3): 200 mg via ORAL
  Filled 2014-02-08 (×5): qty 2

## 2014-02-08 MED ORDER — LIDOCAINE HCL (CARDIAC) 20 MG/ML IV SOLN
INTRAVENOUS | Status: AC
  Filled 2014-02-08: qty 5

## 2014-02-08 MED ORDER — CARVEDILOL 6.25 MG PO TABS
6.2500 mg | ORAL_TABLET | Freq: Every day | ORAL | Status: DC
  Administered 2014-02-08 – 2014-02-09 (×2): 6.25 mg via ORAL
  Filled 2014-02-08 (×2): qty 1

## 2014-02-08 MED ORDER — SODIUM CHLORIDE 0.9 % IJ SOLN: 3.0000 mL | INTRAMUSCULAR | Status: DC | PRN

## 2014-02-08 MED ORDER — PROMETHAZINE HCL 25 MG/ML IJ SOLN: 6.2500 mg | INTRAMUSCULAR | Status: DC | PRN

## 2014-02-08 MED ORDER — DONEPEZIL HCL 10 MG PO TABS
10.0000 mg | ORAL_TABLET | Freq: Every day | ORAL | Status: DC
  Administered 2014-02-08: 10 mg via ORAL
  Filled 2014-02-08 (×2): qty 1

## 2014-02-08 MED ORDER — FENTANYL CITRATE 0.05 MG/ML IJ SOLN
INTRAMUSCULAR | Status: DC | PRN
  Administered 2014-02-08: 100 ug via INTRAVENOUS
  Administered 2014-02-08: 50 ug via INTRAVENOUS

## 2014-02-08 MED ORDER — HYDRALAZINE HCL 20 MG/ML IJ SOLN: 10.0000 mg | INTRAMUSCULAR | Status: DC | PRN

## 2014-02-08 MED ORDER — SODIUM CHLORIDE 0.9 % IV SOLN: INTRAVENOUS | Status: DC

## 2014-02-08 MED ORDER — ARTIFICIAL TEARS OP OINT
TOPICAL_OINTMENT | OPHTHALMIC | Status: AC
  Filled 2014-02-08: qty 3.5

## 2014-02-08 SURGICAL SUPPLY — 34 items
BANDAGE ELASTIC 4 VELCRO ST LF (GAUZE/BANDAGES/DRESSINGS) ×2 IMPLANT
BNDG GAUZE ELAST 4 BULKY (GAUZE/BANDAGES/DRESSINGS) ×1 IMPLANT
CANISTER SUCTION 2500CC (MISCELLANEOUS) ×2 IMPLANT
COVER SURGICAL LIGHT HANDLE (MISCELLANEOUS) ×2 IMPLANT
DRAPE EXTREMITY T 121X128X90 (DRAPE) ×2 IMPLANT
DRSG EMULSION OIL 3X3 NADH (GAUZE/BANDAGES/DRESSINGS) ×2 IMPLANT
ELECT REM PT RETURN 9FT ADLT (ELECTROSURGICAL) ×2
ELECTRODE REM PT RTRN 9FT ADLT (ELECTROSURGICAL) ×1 IMPLANT
GLOVE BIOGEL PI IND STRL 6.5 (GLOVE) IMPLANT
GLOVE BIOGEL PI IND STRL 7.0 (GLOVE) IMPLANT
GLOVE BIOGEL PI IND STRL 7.5 (GLOVE) ×1 IMPLANT
GLOVE BIOGEL PI INDICATOR 6.5 (GLOVE) ×1
GLOVE BIOGEL PI INDICATOR 7.0 (GLOVE) ×3
GLOVE BIOGEL PI INDICATOR 7.5 (GLOVE) ×1
GLOVE SURG SS PI 7.0 STRL IVOR (GLOVE) ×2 IMPLANT
GOWN STRL REUS W/ TWL LRG LVL3 (GOWN DISPOSABLE) ×3 IMPLANT
GOWN STRL REUS W/ TWL XL LVL3 (GOWN DISPOSABLE) IMPLANT
GOWN STRL REUS W/TWL LRG LVL3 (GOWN DISPOSABLE) ×6
GOWN STRL REUS W/TWL XL LVL3 (GOWN DISPOSABLE) ×2
KIT BASIN OR (CUSTOM PROCEDURE TRAY) ×2 IMPLANT
KIT ROOM TURNOVER OR (KITS) ×2 IMPLANT
NS IRRIG 1000ML POUR BTL (IV SOLUTION) ×2 IMPLANT
PACK GENERAL/GYN (CUSTOM PROCEDURE TRAY) ×2 IMPLANT
PAD ARMBOARD 7.5X6 YLW CONV (MISCELLANEOUS) ×4 IMPLANT
SPECIMEN JAR SMALL (MISCELLANEOUS) ×2 IMPLANT
SPONGE GAUZE 4X4 12PLY (GAUZE/BANDAGES/DRESSINGS) ×2 IMPLANT
SUT ETHILON 3 0 PS 1 (SUTURE) ×2 IMPLANT
SUT VIC AB 3-0 SH 27 (SUTURE) ×4
SUT VIC AB 3-0 SH 27X BRD (SUTURE) IMPLANT
TOWEL OR 17X24 6PK STRL BLUE (TOWEL DISPOSABLE) ×2 IMPLANT
TOWEL OR 17X26 10 PK STRL BLUE (TOWEL DISPOSABLE) ×2 IMPLANT
TUBE ANAEROBIC SPECIMEN COL (MISCELLANEOUS) IMPLANT
UNDERPAD 30X30 INCONTINENT (UNDERPADS AND DIAPERS) ×2 IMPLANT
WATER STERILE IRR 1000ML POUR (IV SOLUTION) ×2 IMPLANT

## 2014-02-08 NOTE — Progress Notes (Signed)
Orthopedic Tech Progress Note Patient Details:  [name] [birth year] [id number]  Ortho Devices Type of Ortho Device: Postop shoe/boot Ortho Device/Splint Interventions: Application   [name] 02/08/2014, 12:52 PM

## 2014-02-08 NOTE — Preoperative (Signed)
Beta Blockers   Reason not to administer Beta Blockers:Not Applicable  Pt took coreg 02/07/14 0900.

## 2014-02-08 NOTE — Anesthesia Postprocedure Evaluation (Signed)
Anesthesia Post Note  Patient: [name]  Procedure(s) Performed: Procedure(s) (LRB): AMPUTATION Left 4th & 5th toes (Left)  Anesthesia type: general  Patient location: PACU  Post pain: Pain level controlled  Post assessment: Patient's Cardiovascular Status Stable  Last Vitals:  Filed Vitals:   02/08/14 1007  BP: 175/77  Pulse: 78  Temp:   Resp: 21    Post vital signs: Reviewed and stable  Level of consciousness: sedated  Complications: No apparent anesthesia complications

## 2014-02-08 NOTE — Op Note (Signed)
    OPERATIVE NOTE   PROCEDURE: 1. Left 4th toe amputation 2. Left 5th toe ray amputation  PRE-OPERATIVE DIAGNOSIS: left foot embolism  POST-OPERATIVE DIAGNOSIS: same as above   SURGEON: [name], MD  ANESTHESIA: general  ESTIMATED BLOOD LOSS: 50 cc  FINDING(S): 1. Viable tissue throughout foot  SPECIMEN(S):  none  INDICATIONS:   [name] is a 63 y.o. 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  She previously has undergone left common iliac artery covered stenting.  She was being observed for her left 4th and 5th toe dry gangrene with the plan to proceed with toe amputation once the tissue was demarcated.  Her pain management became difficult, so she was subsequently scheduled for left 4th and 5th toe amputation.  The patient's family is aware that risks include: bleeding, infection, myocardial infarction, stroke, death, and need for a more proximal amputation.  The family elected to proceed.  DESCRIPTION: After obtaining full informed written consent, the patient was brought back to the operating room and placed supine upon the operating table.  The patient received IV antibiotics prior to induction.  After obtaining adequate anesthesia, the patient was prepped and draped in the standard fashion for: left foot procedure.  I marked out a racket shaped incision for the left 5th toe and an elliptical incision around the 4th toe.  I made an incision around the 4th toe and then the 5th toe as marked.  Using electrocautery, I dissected through the subcutaneous tissue down to the proximal 5th phalange.  Using blunt dissection, I was able to get to the left 5th metatarpal-phalange joint.  In extracting the 5th toe, it fractured at the proximal phalange shaft.  Using rongeurs, I extracted the residual 5th proximal phalange.  I then dissected out the 4th proximal phalange with electrocautery.  I released the 4th phalange at the [facility]  joint.  There was enough redundant tissue to have a tension free closure at the 4th toe amputation site but I felt the 5th metatarsal protruded too much, so I cut off the 5th metatarsal head with a rongeur.  I felt there was enough room for a tension free closure at this point.  I washed out all wounds and controlled any bleeding with electrocautery.  The subcutaneous tissue was reapproximated with 3-0 Vicryl stitches.  The skin over both amputation sites were then reapproximated at the skin level with horizontal mattress stitches of 3-0 Vicryl.  The skin was cleaned and dried.  In the process, I stripped the superficial skin overlying the left great toe.  The area of ischemic skin completely lysed.  An [employer] dressing was applied to the combined surgical incision.  Sterile fluffs were applied.  Sterile 4 x 4 dressing were applied and the left foot was wrapped with a Kerlix.  COMPLICATIONS: none  CONDITION: stable  [name], MD [facility] [place] Office: [phone] Pager: [phone]  02/08/2014, 9:08 AM

## 2014-02-08 NOTE — Transfer of Care (Signed)
Immediate Anesthesia Transfer of Care Note  Patient: [name]  Procedure(s) Performed: Procedure(s): AMPUTATION Left 4th & 5th toes (Left)  Patient Location: PACU  Anesthesia Type:General  Level of Consciousness: awake and patient cooperative  Airway & Oxygen Therapy: Patient Spontanous Breathing  Post-op Assessment: Report given to PACU RN, Post -op Vital signs reviewed and stable and Patient moving all extremities X 4  Post vital signs: Reviewed and stable  Complications: No apparent anesthesia complications

## 2014-02-08 NOTE — H&P (Signed)
Brief History and Physical  History of Present Illness  [name] is [name] 63 y.o. female who presents with chief complaint: left 4th and 5th ischemic toes.  This patient had thromboembolism to L foot from [name] L CIA lesion, which was recently stented.  The pain in L foot has been unbearable.  Dr. Trula Slade felt the patient was ready to proceed with amputation.  The patient presents today for left 4th and 5th toe amputation.    Past Medical History  Diagnosis Date  . Renal disorder     kidney stones  . Toxic encephalopathy   . UTI (lower urinary tract infection)     takes Macrodantin daily  . COPD (chronic obstructive pulmonary disease)   . Hyperlipidemia     takes Atorvastatin daily  . Hypertension     takes Amlodipine and Carvedilol daily  . Subacute delirium 10/14/2013    takes Seroquel and Dilantin nightly  . Alzheimer's dementia     takes Aricept nightly  . Insomnia     takes Ambien nightly  . Anemia   . History of blood transfusion     received 2units on Feb 17,2015  . History of MRSA infection     several yrs ago    Past Surgical History  Procedure Laterality Date  . Abdominal hysterectomy    . Knee surgery    . Bladder surgery    . Insertion of iliac stent Left 01/26/2014    Procedure: INSERTION OF ILIAC STENT- LEFT COMMON ILIAC ARTERY STENT ;  Surgeon: Conrad Kane, MD;  Location: [facility];  Service: Vascular;  Laterality: Left;  . Cholecystectomy  1995    Gall Bladder    History   Social History  . Marital Status: Married    Spouse Name: N/[name]    Number of Children: N/[name]  . Years of Education: N/[name]   Occupational History  . Not on file.   Social History Main Topics  . Smoking status: Former Smoker -- 1.00 packs/day    Types: Cigarettes  . Smokeless tobacco: Never Used  . Alcohol Use: No  . Drug Use: No  . Sexual Activity: Not on file   Other Topics Concern  . Not on file   Social History Narrative  . No narrative on file    Family History    Problem Relation Age of Onset  . Cancer Brother     No current facility-administered medications on file prior to encounter.   Current Outpatient Prescriptions on File Prior to Encounter  Medication Sig Dispense Refill  . amLODipine (NORVASC) 5 MG tablet Take 5 mg by mouth daily.      [name] Kitchen atorvastatin (LIPITOR) 10 MG tablet Take 10 mg by mouth daily.       . carvedilol (COREG) 6.25 MG tablet Take 6.25 mg by mouth daily.       . clopidogrel (PLAVIX) 75 MG tablet Take 1 tablet (75 mg total) by mouth daily with breakfast.  30 tablet  1  . donepezil (ARICEPT) 10 MG tablet Take 1 tablet (10 mg total) by mouth at bedtime.  90 tablet  1  . gabapentin (NEURONTIN) 100 MG capsule Take 200 mg by mouth 3 (three) times daily.       . magnesium oxide (MAG-OX) 400 MG tablet Take 400 mg by mouth 3 (three) times daily.       . phenytoin (DILANTIN) 100 MG ER capsule Take 2 capsules (200 mg total) by mouth at  bedtime.  180 capsule  1  . QUEtiapine (SEROQUEL) 25 MG tablet Take 1 tablet (25 mg total) by mouth every evening.  90 tablet  1  . zolpidem (AMBIEN) 10 MG tablet Take 1 tablet (10 mg total) by mouth at bedtime as needed for sleep. Take one half tab by mouth prn insomnia.  30 tablet  0    Allergies  Allergen Reactions  . Codeine Nausea And Vomiting    REACTION: intolerance  . Latex     itching    Review of Systems: As listed above, otherwise negative.  Physical Examination  Filed Vitals:   02/08/14 0601  BP: 180/63  Pulse: 76  Temp: 98.3 F (36.8 C)  TempSrc: Oral  Resp: 18  SpO2: 100%    General: [name]&O x 3, WDWN  Pulmonary: Sym exp, good air movt, CTAB, no rales, rhonchi, & wheezing  Cardiac: RRR, Nl S1, S2, no Murmurs, rubs or gallops  Gastrointestinal: soft, NTND, -G/R, - HSM, - masses, - CVAT B  Musculoskeletal: M/S 5/5 throughout Extremities without ischemic changes except  Superficial ischemic great toe skin, L 4th and 5th toe amputation  Laboratory See Idanha [name] is [name] 63 y.o. female who presents with: L foot thromboembolism   The patient is scheduled for: L 4th and 5th toe amputation.  The patient is aware of the risks and agrees to proceed.  [name], MD [facility] [place] Office: [phone] Pager: [phone]  02/08/2014, 7:53 AM

## 2014-02-08 NOTE — Progress Notes (Signed)
Pt received from PACU into room 2w20, pt oriented to room, monitor placed on pt, left foot elevated Rickard Rhymes, RN

## 2014-02-08 NOTE — Anesthesia Preprocedure Evaluation (Addendum)
Anesthesia Evaluation  Patient identified by MRN, date of birth, ID band Patient awake    Reviewed: Allergy & Precautions, H&P , NPO status , Patient's Chart, lab work & pertinent test results  History of Anesthesia Complications Negative for: history of anesthetic complications  Airway Mallampati: II TM Distance: >3 FB Neck ROM: Full    Dental  (+) Poor Dentition, Dental Advisory Given   Pulmonary COPDformer smoker,    Pulmonary exam normal       Cardiovascular hypertension, Pt. on medications and Pt. on home beta blockers + Peripheral Vascular Disease     Neuro/Psych  Headaches, Dementia negative psych ROS   GI/Hepatic Neg liver ROS, GERD-  Medicated,  Endo/Other  negative endocrine ROS  Renal/GU Dialysis and Renal InsufficiencyRenal disease     Musculoskeletal   Abdominal   Peds  Hematology   Anesthesia Other Findings   Reproductive/Obstetrics                          Anesthesia Physical Anesthesia Plan  ASA: III  Anesthesia Plan: General   Post-op Pain Management:    Induction: Intravenous  Airway Management Planned: LMA  Additional Equipment:   Intra-op Plan:   Post-operative Plan: Extubation in OR  Informed Consent: I have reviewed the patients History and Physical, chart, labs and discussed the procedure including the risks, benefits and alternatives for the proposed anesthesia with the patient or authorized representative who has indicated his/her understanding and acceptance.   Dental advisory given  Plan Discussed with: CRNA, Anesthesiologist and Surgeon  Anesthesia Plan Comments:        Anesthesia Quick Evaluation

## 2014-02-09 ENCOUNTER — Telehealth: Payer: Self-pay | Admitting: Vascular Surgery

## 2014-02-09 MED ORDER — HYDROMORPHONE HCL 2 MG PO TABS: 1.0000 mg | ORAL_TABLET | Freq: Four times a day (QID) | ORAL | Status: DC | PRN

## 2014-02-09 MED ORDER — OXYCODONE HCL 5 MG PO TABS: 10.0000 mg | ORAL_TABLET | Freq: Four times a day (QID) | ORAL | Status: DC | PRN

## 2014-02-09 NOTE — Telephone Encounter (Addendum)
Message copied by Gena Fray on Tue Feb 09, 2014 10:09 AM ------      Message from: Mena Goes      Created: Tue Feb 09, 2014  7:59 AM      Regarding: schedule                   ----- Message -----         From: Conrad [place], MD         Sent: 02/09/2014   7:32 AM           To: [facility] [address]            SAFIYAH BRAYMAN      [id number]      Oct 07, 1951                  Follow-up: 2 weeks for suture removal       ------  02/09/14: spoke with pts spouse

## 2014-02-09 NOTE — Progress Notes (Signed)
Discharge medication and instructions reviewed with patient, prescriptions given, exit care education given to patient, pt stated understanding and stated she did not have any questions, pt instructed on daily dressing changes and given supplies, pt stated she had no questions about any discharge or wound education. Rickard Rhymes, RN

## 2014-02-09 NOTE — Discharge Summary (Signed)
Physician Discharge Summary  Patient ID: [name] MRN: [id number] DOB/AGE: 63-Sep-1952 63 y.o.  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 Renal disorder     kidney stones  . Toxic encephalopathy   . UTI (lower urinary tract infection)     takes Macrodantin daily  . COPD (chronic obstructive pulmonary disease)   . Hyperlipidemia     takes Atorvastatin daily  . Hypertension     takes Amlodipine and Carvedilol daily  . Subacute delirium 10/14/2013    takes Seroquel and Dilantin nightly  . Alzheimer's dementia     takes Aricept nightly  . Insomnia     takes Ambien nightly  . Anemia   . History of blood transfusion     received 2units on Feb 17,2015  . History of MRSA infection     several yrs ago    Procedures: 1.  Left 4th toe amputation 2.  Left 5th ray amputation  Discharged Condition: good  Hospital Course: [name] is a 63 y.o. 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  On her previous admission, she underwent L common iliac artery stenting to cover the embolic segment.  She then underwent medical management to let her left foot demarcate.  She returned on 02/08/14 for amputation of her left 4th and 5th toes for pain control.  Her operation was uneventful and she was discharged the next day with adequate pain control.  Significant Diagnostic Studies: CBC    Component Value Date/Time   WBC 10.6* 02/08/2014 1116   RBC 3.37* 02/08/2014 1116   HGB 9.7* 02/08/2014 1116   HCT 31.4* 02/08/2014 1116   PLT 246 02/08/2014 1116   MCV 93.2 02/08/2014 1116   MCH 28.8 02/08/2014 1116   MCHC 30.9 02/08/2014 1116   RDW 17.8* 02/08/2014 1116   LYMPHSABS 2.7 01/17/2014 1741   MONOABS 0.6 01/17/2014 1741   EOSABS 0.2 01/17/2014 1741   BASOSABS 0.0 01/17/2014  1741    BMET    Component Value Date/Time   NA 139 01/27/2014 0415   K 4.0 01/27/2014 0415   CL 107 01/27/2014 0415   CO2 24 01/27/2014 0415   GLUCOSE 96 01/27/2014 0415   BUN 8 01/27/2014 0415   CREATININE 1.00 02/08/2014 1116   CALCIUM 8.2* 01/27/2014 0415   GFRNONAA 59* 02/08/2014 1116   GFRAA 68* 02/08/2014 1116    COAG Lab Results  Component Value Date   INR 1.04 01/25/2014   INR 1.01 01/17/2014   INR 1.0 10/10/2007   No results found for this basename: PTT    Disposition: 06-Home-Health Care Svc   Future Appointments Provider Department Dept Phone   02/12/2014 3:15 PM Conrad [place], MD [facility] [phone]   02/26/2014 9:15 AM Conrad [place], MD [facility] [phone]       Medication List    STOP taking these medications       oxyCODONE-acetaminophen 5-325 MG per tablet  Commonly known as:  PERCOCET/ROXICET     traMADol 50 MG tablet  Commonly known as:  ULTRAM      TAKE these medications       amLODipine 5 MG tablet  Commonly known as:  NORVASC  Take 5 mg  by mouth daily.     atorvastatin 10 MG tablet  Commonly known as:  LIPITOR  Take 10 mg by mouth daily.     carvedilol 6.25 MG tablet  Commonly known as:  COREG  Take 6.25 mg by mouth daily.     clopidogrel 75 MG tablet  Commonly known as:  PLAVIX  Take 1 tablet (75 mg total) by mouth daily with breakfast.     donepezil 10 MG tablet  Commonly known as:  ARICEPT  Take 1 tablet (10 mg total) by mouth at bedtime.     gabapentin 100 MG capsule  Commonly known as:  NEURONTIN  Take 200 mg by mouth 3 (three) times daily.     HYDROmorphone 2 MG tablet  Commonly known as:  DILAUDID  Take 0.5 tablets (1 mg total) by mouth every 6 (six) hours as needed (breakthrough pain).     magnesium oxide 400 MG tablet  Commonly known as:  MAG-OX  Take 400 mg by mouth 3 (three) times daily.     nitrofurantoin 100 MG capsule  Commonly known as:  MACRODANTIN   Take 100 mg by mouth daily.     oxyCODONE 5 MG immediate release tablet  Commonly known as:  ROXICODONE  Take 2 tablets (10 mg total) by mouth every 6 (six) hours as needed for moderate pain.     phenytoin 100 MG ER capsule  Commonly known as:  DILANTIN  Take 2 capsules (200 mg total) by mouth at bedtime.     QUEtiapine 25 MG tablet  Commonly known as:  SEROQUEL  Take 1 tablet (25 mg total) by mouth every evening.     zolpidem 10 MG tablet  Commonly known as:  AMBIEN  Take 1 tablet (10 mg total) by mouth at bedtime as needed for sleep. Take one half tab by mouth prn insomnia.        SignedHinda Lenis 02/09/2014, 7:21 AM

## 2014-02-09 NOTE — Care Management Note (Signed)
    Page 1 of 1   02/09/2014     4:16:37 PM   CARE MANAGEMENT NOTE 02/09/2014  Patient:  [name], [name]   Account Number:  [id number]  Date Initiated:  02/09/2014  Documentation initiated by:  Aikeem Lilley  Subjective/Objective Assessment:   PT S/P TOE AMPUTATIONS ON 02/08/14.  PTA, PT RESIDES AT [facility].     Action/Plan:   PT HAS HX OF HH WITH [facility], AND WISHES TO USE AGAIN. REFERRAL TO [facility] FOR HH NEEDS; START OF CARE 24-48H POST DC DATE.   Anticipated DC Date:  02/09/2014   Anticipated DC Plan:  [place]  CM consult      [facility] Choice  HOME HEALTH   Choice offered to / List presented to:  C-1 Patient        [place] arranged  HH-1 RN  [place].   Status of service:  Completed, signed off Medicare Important Message given?   (If response is "NO", the following Medicare IM given date fields will be blank) Date Medicare IM given:   Date Additional Medicare IM given:    Discharge Disposition:  [place]  Per UR Regulation:  Reviewed for med. necessity/level of care/duration of stay  If discussed at [place] of Stay Meetings, dates discussed:    Comments:

## 2014-02-09 NOTE — Progress Notes (Signed)
Discharged education and instructions reviewed with family, son and daughter, and family stated understanding IV and tele removed Rickard Rhymes, RN

## 2014-02-09 NOTE — Telephone Encounter (Addendum)
Message copied by Gena Fray on Tue Feb 09, 2014 10:09 AM ------      Message from: [name]      Created: Mon Feb 08, 2014 10:29 AM      Regarding: [name] log                   ----- Message -----         From: [name] [place], MD         Sent: 02/08/2014   9:20 AM           To: [facility] [address]            [name]      [id number]      [birth year]            PROCEDURE:      Left 4th toe amputation      Left 5th toe ray amputation            Follow-up: 4 weeks       ------  02/08/14: spoke with pts spouse, dpm

## 2014-02-10 ENCOUNTER — Encounter (HOSPITAL_COMMUNITY): Payer: Self-pay | Admitting: Vascular Surgery

## 2014-02-12 ENCOUNTER — Ambulatory Visit: Payer: Medicare Other | Admitting: Vascular Surgery

## 2014-02-15 ENCOUNTER — Telehealth: Payer: Self-pay

## 2014-02-15 DIAGNOSIS — G8918 Other acute postprocedural pain: Secondary | ICD-10-CM

## 2014-02-15 MED ORDER — OXYCODONE-ACETAMINOPHEN 5-325 MG PO TABS: ORAL_TABLET | ORAL | Status: DC

## 2014-02-15 NOTE — Telephone Encounter (Signed)
Pt's. Husband called with request for refill on pain medication.  Stated pt. Has been taking the Oxycodone 5mg  1-2 tablets every 4 hrs for pain.  Also, stated she has taken about 1/2  The prescription of Hydromorphone, and that this has caused  Nausea.  Reported that she "is still in a lot of pain".  Husband stated he knows her pain is at least 6/10.  Discussed with Dr. Kellie Simmering, MD in office today.  Rec'd verbal order for Oxycodone/Acetaminophen 5/325 mg; 1-2 tabs po. q 4-6 hrs/ prn/ pain.; # 30; no refills.

## 2014-02-24 ENCOUNTER — Encounter: Payer: Self-pay | Admitting: Vascular Surgery

## 2014-02-25 ENCOUNTER — Ambulatory Visit (INDEPENDENT_AMBULATORY_CARE_PROVIDER_SITE_OTHER): Payer: Self-pay | Admitting: Vascular Surgery

## 2014-02-25 ENCOUNTER — Encounter: Payer: Self-pay | Admitting: Vascular Surgery

## 2014-02-25 VITALS — BP 186/86 | HR 69 | Resp 16 | Ht 61.0 in | Wt 84.0 lb

## 2014-02-25 DIAGNOSIS — I743 Embolism and thrombosis of arteries of the lower extremities: Secondary | ICD-10-CM

## 2014-02-25 DIAGNOSIS — Z48812 Encounter for surgical aftercare following surgery on the circulatory system: Secondary | ICD-10-CM

## 2014-02-25 NOTE — Progress Notes (Signed)
    Postoperative Visit   History of Present Illness  [name] is a 63 y.o. female who presents for postoperative follow-up for: Left 4th toe amputation & Left 5th toe ray amputation (02/08/14).  The patient's wounds are healed.  The patient notes pain is well controlled.  The patient's current symptoms are: none.  For VQI Use Only  PRE-ADM LIVING: Home  AMB STATUS: Ambulatory  Physical Examination  Filed Vitals:   02/25/14 1421  BP: 186/86  Pulse: 69  Resp: 16   L foot: 4th and 5th toe amputation site is healed, suture removed today, no TTP  Medical Decision Making  [name] is a 63 y.o. female who presents s/p L 4th toe amputation and 5th ray amp We will continue surveillance on her L CIA stent q3 months: BLE ABI and aortoiliac duplex I discussed in depth with the patient the nature of atherosclerosis, and emphasized the importance of maximal medical management including strict control of blood pressure, blood glucose, and lipid levels, obtaining regular exercise, and cessation of smoking.  The patient is aware that without maximal medical management the underlying atherosclerotic disease process will progress, possibly leading to a more proximal amputation. The patient's BP is up, by report she was told by her PCP to stop taking her Rx.  I recommended she get back in touch with him/her as her BP is elevated again today. The patient agrees to participate in their maximal medical care.  [name], MD [facility] [place] Office: [phone] Pager: [phone]  02/25/2014, 2:38 PM

## 2014-02-26 ENCOUNTER — Encounter: Payer: Medicare Other | Admitting: Vascular Surgery

## 2014-02-26 NOTE — Addendum Note (Signed)
Addended by: Dorthula Rue L on: 02/26/2014 06:39 PM   Modules accepted: Orders

## 2014-03-10 ENCOUNTER — Encounter: Payer: Self-pay | Admitting: Family

## 2014-03-11 ENCOUNTER — Ambulatory Visit: Payer: Medicare Other | Admitting: Family

## 2014-03-26 ENCOUNTER — Other Ambulatory Visit: Payer: Self-pay

## 2014-03-26 DIAGNOSIS — G9340 Encephalopathy, unspecified: Secondary | ICD-10-CM

## 2014-03-26 DIAGNOSIS — F03918 Unspecified dementia, unspecified severity, with other behavioral disturbance: Secondary | ICD-10-CM

## 2014-03-26 DIAGNOSIS — G4701 Insomnia due to medical condition: Secondary | ICD-10-CM

## 2014-03-26 DIAGNOSIS — F0391 Unspecified dementia with behavioral disturbance: Secondary | ICD-10-CM

## 2014-03-26 MED ORDER — ZOLPIDEM TARTRATE 10 MG PO TABS: 5.0000 mg | ORAL_TABLET | Freq: Every evening | ORAL | Status: DC | PRN

## 2014-03-26 NOTE — Telephone Encounter (Signed)
Rx signed and faxed.

## 2014-05-13 ENCOUNTER — Encounter: Payer: Self-pay | Admitting: Vascular Surgery

## 2014-05-14 ENCOUNTER — Encounter: Payer: Self-pay | Admitting: Vascular Surgery

## 2014-05-14 ENCOUNTER — Ambulatory Visit (HOSPITAL_COMMUNITY)
Admission: RE | Admit: 2014-05-14 | Discharge: 2014-05-14 | Disposition: A | Discharge status: Home / Self Care | Payer: Medicare Other | Source: Ambulatory Visit | Attending: Vascular Surgery | Admitting: Vascular Surgery

## 2014-05-14 ENCOUNTER — Ambulatory Visit (INDEPENDENT_AMBULATORY_CARE_PROVIDER_SITE_OTHER)
Admission: RE | Admit: 2014-05-14 | Discharge: 2014-05-14 | Disposition: A | Discharge status: Home / Self Care | Payer: Medicare Other | Source: Ambulatory Visit | Attending: Vascular Surgery | Admitting: Vascular Surgery

## 2014-05-14 ENCOUNTER — Other Ambulatory Visit: Payer: Self-pay | Admitting: Vascular Surgery

## 2014-05-14 ENCOUNTER — Ambulatory Visit (INDEPENDENT_AMBULATORY_CARE_PROVIDER_SITE_OTHER): Payer: Medicare Other | Admitting: Vascular Surgery

## 2014-05-14 VITALS — BP 163/66 | HR 58 | Ht 61.0 in | Wt 84.2 lb

## 2014-05-14 DIAGNOSIS — I771 Stricture of artery: Secondary | ICD-10-CM

## 2014-05-14 DIAGNOSIS — Z48812 Encounter for surgical aftercare following surgery on the circulatory system: Secondary | ICD-10-CM

## 2014-05-14 DIAGNOSIS — I743 Embolism and thrombosis of arteries of the lower extremities: Secondary | ICD-10-CM

## 2014-05-14 NOTE — Addendum Note (Signed)
Addended by: Dorthula Rue L on: 05/14/2014 02:03 PM   Modules accepted: Orders

## 2014-05-14 NOTE — Progress Notes (Signed)
Established Previous Peripheral Intervention  History of Present Illness  [name] is a 63 y.o. (Jan 31, 1951) female who presents with chief complaint: left lateral ankle swelling.  Previous operation(s) complete  include:  1.  L CIA PTA+S (01/26/14) for L iliac lesion that was embolizing 2.  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   The patient's symptoms have not progressed.  The patient's symptoms are: left ankle swelling without any triggers. The patient's treatment regimen currently included: maximal medical management.  Pt continues to smoke.  The patient's PMH, PSH, SH, FamHx, Med, and Allergies are unchanged from 02/25/14.  On ROS today: no intermittent claudication , no rest pain  Physical Examination  Filed Vitals:   05/14/14 1027  BP: 163/66  Pulse: 58  Height: 5\' 1"  (1.549 m)  Weight: 84 lb 3.2 oz (38.193 kg)  SpO2: 100%   Body mass index is 15.92 kg/(m^2).  Physical Examination  Filed Vitals:   05/14/14 1027  BP: 163/66  Pulse: 58  Height: 5\' 1"  (1.549 m)  Weight: 84 lb 3.2 oz (38.193 kg)  SpO2: 100%   Body mass index is 15.92 kg/(m^2).  General: A&O x 3, WD, thin  Pulmonary: Sym exp, good air movt, CTAB, no rales, rhonchi, & wheezing  Cardiac: RRR, Nl S1, S2, no Murmurs, rubs or gallops  Vascular: Vessel Right Left  Radial Palpable Palpable  Brachial Palpable Palpable  Carotid Palpable, without bruit Palpable, without bruit  Aorta Not palpable N/A  Femoral Palpable Palpable  Popliteal Not palpable Not palpable  PT Palpable Palpable  DP Palpable Palpable   Gastrointestinal: soft, NTND, -G/R, - HSM, - masses, - CVAT B  Musculoskeletal: M/S 5/5 throughout , Extremities without ischemic changes , L 4th and 5th toe amputations sites well healed, no swelling in either leg  Neurologic: Pain and light touch intact in extremities , Motor exam as listed above  Non-Invasive Vascular Imaging ABI (Date: 05/14/2014) R: 0.97 (1.01), DP: tri, PT: tri,  TBI: 0.36 L: 0.97 (1.03), DP: tri, PT: tri, TBI: 0.17  Aortoiliac Duplex (Date: 05/14/2014)  Ao: 87 c/s  L iliac: 150-188 c/s  Patent L CIA stent  Medical Decision Making  [name] is a 63 y.o. female who presents with: s/p L CIA PTA+S and L 4th and 5th toe amputation for thromboembolism from L CIA lesion .  Based on the patient's vascular studies and examination, I have offered the patient: BLE ABI, aortoiliac duplex in 3 months  I discussed in depth with the patient the nature of atherosclerosis, and emphasized the importance of maximal medical management including strict control of blood pressure, blood glucose, and lipid levels, obtaining regular exercise, and cessation of smoking.  The patient is aware that without maximal medical management the underlying atherosclerotic disease process will progress, limiting the benefit of any interventions.  I discussed in depth with the patient the need for long term surveillance to improve the primary assisted patency of his bypass.  The patient agrees to cooperate with such.  I re-emphasized the importance of smoking cessation.  She will continue to try to stop smoking. The patient is currently on a statin: Lipitor. The patient is currently on an anti-platelet: Plavix.  The patient is scheduled for the previous listed surveillance studies in 3 months.  Thank you for allowing [place] to participate in this patient's care.  [name], MD [facility] [place] Office: [phone] Pager: [phone]  05/14/2014, 11:16 AM

## 2014-07-20 ENCOUNTER — Other Ambulatory Visit: Payer: Self-pay

## 2014-07-20 DIAGNOSIS — R410 Disorientation, unspecified: Secondary | ICD-10-CM

## 2014-07-20 DIAGNOSIS — F03918 Unspecified dementia, unspecified severity, with other behavioral disturbance: Secondary | ICD-10-CM

## 2014-07-20 DIAGNOSIS — F05 Delirium due to known physiological condition: Secondary | ICD-10-CM

## 2014-07-20 DIAGNOSIS — F0391 Unspecified dementia with behavioral disturbance: Secondary | ICD-10-CM

## 2014-07-20 MED ORDER — DONEPEZIL HCL 10 MG PO TABS: 10.0000 mg | ORAL_TABLET | Freq: Every day | ORAL | Status: DC

## 2014-08-09 ENCOUNTER — Other Ambulatory Visit: Payer: Self-pay

## 2014-08-09 DIAGNOSIS — R410 Disorientation, unspecified: Secondary | ICD-10-CM

## 2014-08-09 DIAGNOSIS — G9340 Encephalopathy, unspecified: Secondary | ICD-10-CM

## 2014-08-09 DIAGNOSIS — F0391 Unspecified dementia with behavioral disturbance: Secondary | ICD-10-CM

## 2014-08-09 DIAGNOSIS — F03918 Unspecified dementia, unspecified severity, with other behavioral disturbance: Secondary | ICD-10-CM

## 2014-08-09 DIAGNOSIS — F05 Delirium due to known physiological condition: Secondary | ICD-10-CM

## 2014-08-09 MED ORDER — PHENYTOIN SODIUM EXTENDED 100 MG PO CAPS: 200.0000 mg | ORAL_CAPSULE | Freq: Every day | ORAL | Status: DC

## 2014-08-13 ENCOUNTER — Other Ambulatory Visit (HOSPITAL_COMMUNITY): Payer: Medicare Other

## 2014-08-13 ENCOUNTER — Encounter (HOSPITAL_COMMUNITY): Payer: Medicare Other

## 2014-08-13 ENCOUNTER — Ambulatory Visit: Payer: Medicare Other | Admitting: Vascular Surgery

## 2014-09-02 ENCOUNTER — Encounter: Payer: Self-pay | Admitting: Vascular Surgery

## 2014-09-03 ENCOUNTER — Ambulatory Visit (INDEPENDENT_AMBULATORY_CARE_PROVIDER_SITE_OTHER): Payer: Medicare Other | Admitting: Vascular Surgery

## 2014-09-03 ENCOUNTER — Ambulatory Visit (INDEPENDENT_AMBULATORY_CARE_PROVIDER_SITE_OTHER)
Admission: RE | Admit: 2014-09-03 | Discharge: 2014-09-03 | Disposition: A | Discharge status: Home / Self Care | Payer: Medicare Other | Source: Ambulatory Visit | Attending: Vascular Surgery | Admitting: Vascular Surgery

## 2014-09-03 ENCOUNTER — Encounter: Payer: Self-pay | Admitting: Vascular Surgery

## 2014-09-03 ENCOUNTER — Ambulatory Visit (HOSPITAL_COMMUNITY)
Admission: RE | Admit: 2014-09-03 | Discharge: 2014-09-03 | Disposition: A | Discharge status: Home / Self Care | Payer: Medicare Other | Source: Ambulatory Visit | Attending: Vascular Surgery | Admitting: Vascular Surgery

## 2014-09-03 ENCOUNTER — Telehealth: Payer: Self-pay | Admitting: *Deleted

## 2014-09-03 VITALS — BP 142/45 | HR 53 | Ht 61.0 in | Wt 79.0 lb

## 2014-09-03 DIAGNOSIS — I743 Embolism and thrombosis of arteries of the lower extremities: Secondary | ICD-10-CM

## 2014-09-03 DIAGNOSIS — I771 Stricture of artery: Secondary | ICD-10-CM | POA: Insufficient documentation

## 2014-09-03 MED ORDER — CLOPIDOGREL BISULFATE 75 MG PO TABS: 75.0000 mg | ORAL_TABLET | Freq: Every day | ORAL | Status: DC

## 2014-09-03 NOTE — Telephone Encounter (Signed)
Patient called to have medication Rx'd to [facility] in the future per [facility] note.

## 2014-09-03 NOTE — Addendum Note (Signed)
Addended by: Mena Goes on: 09/03/2014 05:12 PM   Modules accepted: Orders

## 2014-09-03 NOTE — Progress Notes (Signed)
    Established Critical Limb Ischemia Patient  History of Present Illness  [name] is a 63 y.o. (Dec 17, 1950) female who presents with chief complaint: no complaints.  Ptatient had 1. L CIA PTA+S (01/26/14) for L iliac lesion that was embolizing  2. 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   The patient has no rest pain and no wounds.  The patient notes symptoms have not progressed.  The patient's treatment regimen currently included: maximal medical management. Pt continues to smoke.  The patient's PMH, PSH, SH, FamHx, Med, and Allergies are unchanged from 05/14/14.  On ROS today: no rest pain, no ulcer or gangrene  Physical Examination  Filed Vitals:   09/03/14 1025  BP: 142/45  Pulse: 53  Height: 5\' 1"  (1.549 m)  Weight: 79 lb (35.834 kg)  SpO2: 100%   Body mass index is 14.93 kg/(m^2).  General: A&O x 3, WDWN  Eyes: PERRLA, EOMI  Pulmonary: Sym exp, good air movt, CTAB, no rales, rhonchi, & wheezing  Cardiac: RRR, Nl S1, S2, no Murmurs, rubs or gallops  Vascular: Vessel Right Left  Radial Palpable Palpable  Brachial Palpable **Palpable  Carotid Palpable, without bruit Palpable, without bruit  Aorta Not palpable N/A  Femoral Palpable Palpable  Popliteal Not palpable Not palpable  PT Palpable Palpable  DP Palpable Palpable   Gastrointestinal: soft, NTND, -G/R, - HSM, - masses, - CVAT B  Musculoskeletal: M/S 5/5 throughout , Extremities without ischemic changes except  Healed L 4th and 5th toe amp  Neurologic: Pain and light touch intact in extremities , Motor exam as listed above  Non-Invasive Vascular Imaging ABI (Date: 09/03/2014)  R: 1.08 (0.97), DP: bi, PT: tri, TBI: 0.25  L: 1.10 (0.97), DP: tri, PT: tri, TBI: 0.21  Aortoiliac duplex (09/03/2014)  Patent CIA stent (ratio 2.5)  Biphasic signals throughout  Medical Decision Making  [name] is a 63 y.o. 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  I discussed in depth with the patient the nature of atherosclerosis, and emphasized the importance of maximal medical management including strict control of blood pressure, blood glucose, and lipid levels, antiplatelet agents, obtaining regular exercise, and cessation of smoking.    The patient is aware that without maximal medical management the underlying atherosclerotic disease process will progress, limiting the benefit of any interventions. The patient is currently on a statin: Lipitor.   The patient is currently on an anti-platelet: Plavix.  I renewed this today.  Thank you for allowing [place] to participate in this patient's care.  [name], MD [facility] Office: [phone] Pager: [phone]  09/03/2014, 10:55 AM

## 2014-09-03 NOTE — Addendum Note (Signed)
Addended by: Mena Goes on: 09/03/2014 05:36 PM   Modules accepted: Orders

## 2014-11-18 ENCOUNTER — Encounter (HOSPITAL_COMMUNITY): Payer: Self-pay | Admitting: Vascular Surgery

## 2014-12-17 ENCOUNTER — Other Ambulatory Visit: Payer: Self-pay | Admitting: Neurology

## 2015-01-11 ENCOUNTER — Other Ambulatory Visit: Payer: Self-pay | Admitting: Neurology

## 2015-01-11 NOTE — Telephone Encounter (Signed)
Patient has appt scheduled

## 2015-01-17 ENCOUNTER — Other Ambulatory Visit: Payer: Self-pay

## 2015-01-17 DIAGNOSIS — F0391 Unspecified dementia with behavioral disturbance: Secondary | ICD-10-CM

## 2015-01-17 DIAGNOSIS — G4701 Insomnia due to medical condition: Secondary | ICD-10-CM

## 2015-01-17 DIAGNOSIS — G4700 Insomnia, unspecified: Secondary | ICD-10-CM

## 2015-01-17 DIAGNOSIS — F03918 Unspecified dementia, unspecified severity, with other behavioral disturbance: Secondary | ICD-10-CM

## 2015-01-17 MED ORDER — ZOLPIDEM TARTRATE 10 MG PO TABS: 5.0000 mg | ORAL_TABLET | Freq: Every evening | ORAL | Status: DC | PRN

## 2015-01-17 NOTE — Telephone Encounter (Signed)
Rx signed and faxed.

## 2015-01-17 NOTE — Telephone Encounter (Signed)
Patient husband calling wanting refills

## 2015-02-03 ENCOUNTER — Telehealth: Payer: Self-pay | Admitting: *Deleted

## 2015-02-03 NOTE — Telephone Encounter (Signed)
Completed - called the patient to reschedule the appointment    By Gray, CMA

## 2015-02-22 ENCOUNTER — Ambulatory Visit: Payer: Medicare Other | Admitting: Adult Health

## 2015-03-02 ENCOUNTER — Ambulatory Visit (INDEPENDENT_AMBULATORY_CARE_PROVIDER_SITE_OTHER): Payer: Medicare Other | Admitting: Adult Health

## 2015-03-02 ENCOUNTER — Encounter: Payer: Self-pay | Admitting: Adult Health

## 2015-03-02 VITALS — BP 146/72 | HR 67 | Ht 60.0 in | Wt 90.0 lb

## 2015-03-02 DIAGNOSIS — R569 Unspecified convulsions: Secondary | ICD-10-CM | POA: Diagnosis not present

## 2015-03-02 DIAGNOSIS — Z5181 Encounter for therapeutic drug level monitoring: Secondary | ICD-10-CM

## 2015-03-02 DIAGNOSIS — F0391 Unspecified dementia with behavioral disturbance: Secondary | ICD-10-CM

## 2015-03-02 DIAGNOSIS — G4701 Insomnia due to medical condition: Secondary | ICD-10-CM

## 2015-03-02 DIAGNOSIS — F03918 Unspecified dementia, unspecified severity, with other behavioral disturbance: Secondary | ICD-10-CM

## 2015-03-02 MED ORDER — CITALOPRAM HYDROBROMIDE 40 MG PO TABS: 40.0000 mg | ORAL_TABLET | Freq: Every day | ORAL | Status: DC

## 2015-03-02 MED ORDER — ZOLPIDEM TARTRATE 5 MG PO TABS: 5.0000 mg | ORAL_TABLET | Freq: Every evening | ORAL | Status: DC | PRN

## 2015-03-02 MED ORDER — DONEPEZIL HCL 10 MG PO TABS: ORAL_TABLET | ORAL | Status: DC

## 2015-03-02 MED ORDER — MEMANTINE HCL 28 X 5 MG & 21 X 10 MG PO TABS: ORAL_TABLET | ORAL | Status: DC

## 2015-03-02 NOTE — Patient Instructions (Signed)
Continue Aricept. I will start namenda for memory. Call when she is almost done with titration pack.  Continue Celexa. Continue Dilantin for seizures.  I will check blood work today.  I will refill Ambien 5 mg at bedtime as needed.

## 2015-03-02 NOTE — Progress Notes (Signed)
PATIENT: [name] DOB: [birth year]  REASON FOR VISIT: follow up- dementia, insomnia, seizures HISTORY FROM: patient  HISTORY OF PRESENT ILLNESS: [name] is a 64 year old female with a history of dementia, insomnia and seizures. She returns today for follow-up. The patient was last seen in November 2014 by Dr. Roddie Mc. Her husband is with her today and reports that her memory has gotten worse. He states that she forgets where she puts things. She is able to complete ADLs independently however she does require supervision. The patient no longer does household chores. Husband states that she simply has forgotten how to do those things. She does not operate a motor vehicle. The husband does state that she is sleepy throughout the day. He states that most days she will sleep most the day and then stay up at night. He states that she has wondered during the night and therefore he has to stay up as well to provide supervision. She is on Ambien 5 mg and he states that works for about 2 hours and the patient is back up. He states that the patient does not complete many activities during the day. She does take oxycodone and tramadol for pain. The patient has an artificial bladder with a catheter that often gets infected and causes pain. The patient also has a history of seizures. He states that most recently she had 4 seizures in November in a span of two days. She was hospitalized at that time. He states that her seizures consist of her eyes rolling back in her head in her hands shake. Since then the patient has not had any seizure events. She is currently taking Dilantin 200 mg at bedtime. The patient returns today for an evaluation.  HISTORY 10/14/13 [facility]): [name] is a 64 y.o. female Is seen here as a referral/ revisit from Dr.Dough for memory loss, confusion..   64 year old Caucasian right-handed female returns today for follow up. She was last seen in February 2014 and her memory tests  were than stable from last year.  The Mini-Mental test was documented as 20/30 points in February and 21/30 in the previous tests. She continues to live with her husband in an independent setting, she can dress herself , bath herself, she is unable to cook - based on safety concerns. She does minor housework, such as Medical sales representative and all types of cleaning chores. Her mood became increasingly depressed over the last 2 years and Depakote has helped to modulate [name] behavior when she gets irritated and frustrated . She was prescribed Celexa which helped these depression symptoms . On her GDS she had scored 6 points indicating at least a mild, residual depression.  She also likes to take daytime naps.  Patient's primary physician requested this visit after a recent hosptalization. She had UTI and fever, and was severley confused when admitted to hospital.  During her hospitalization at [facility] an EEG was performed , showing left brain originating epileptiform brain discharges ( described as epileptiform without clinical correlate) . The EEG showed periodic discharges- But neither status epilepticus nor subclinical seizures appeared related. The patient was noted to be agitated and In delirium in the setting of this hospitalization . It was felt that this delirium developed due to 1) medication, 2) febrile UTI and 3)the change in environment- all in the setting of worsening dementia - causing behavior changes. She was started on Seroquel 12.5 mg po twice a day with improvement in her agitation. The doses may  need further adjustment. Please note that ciprofloxacin Was used in the treatment of her UTI and several other antibiotics in the same family have a seizure threshold lowering effect. She also had low albumin levels, it was felt that Dilantin would therefor not be a good medication to be continued further.  Patient remains with poor attention , but is not agitated or  impulsive today. She feels " down" . She requested a sleep aid, waking up 3-4 times at night. She denies nocturia.  EDS: Epworth is 12 points.   REVIEW OF SYSTEMS: Out of a complete 14 system review of symptoms, the patient complains only of the following symptoms, and all other reviewed systems are negative.  See history of present illness   ALLERGIES: Allergies  Allergen Reactions  . Codeine Nausea And Vomiting    REACTION: intolerance  . Latex     itching    HOME MEDICATIONS: Outpatient Prescriptions Prior to Visit  Medication Sig Dispense Refill  . amLODipine (NORVASC) 5 MG tablet Take 5 mg by mouth daily.    Marland Kitchen atorvastatin (LIPITOR) 10 MG tablet Take 10 mg by mouth daily.     . carvedilol (COREG) 6.25 MG tablet Take 6.25 mg by mouth daily.     . ciprofloxacin (CIPRO) 250 MG tablet     . ciprofloxacin (CIPRO) 500 MG tablet     . citalopram (CELEXA) 40 MG tablet     . citalopram (CELEXA) 40 MG tablet TAKE 1 BY MOUTH DAILY 90 tablet 0  . cyclobenzaprine (FLEXERIL) 5 MG tablet     . divalproex (DEPAKOTE) 250 MG DR tablet Take 250 mg by mouth.    . donepezil (ARICEPT) 10 MG tablet TAKE 1 BY MOUTH AT BEDTIME 30 tablet 0  . gabapentin (NEURONTIN) 100 MG capsule Take 200 mg by mouth 3 (three) times daily.     . Glucosamine Sulfate (DONA) 1500 MG PACK Take by mouth.    Marland Kitchen HYDROmorphone (DILAUDID) 2 MG tablet Take 0.5 tablets (1 mg total) by mouth every 6 (six) hours as needed (breakthrough pain). 30 tablet 0  . levofloxacin (LEVAQUIN) 500 MG tablet     . magnesium oxide (MAG-OX) 400 MG tablet Take 400 mg by mouth 3 (three) times daily.     . metroNIDAZOLE (FLAGYL) 500 MG tablet     . nitrofurantoin (MACRODANTIN) 100 MG capsule Take 100 mg by mouth daily.    . nitrofurantoin, macrocrystal-monohydrate, (MACROBID) 100 MG capsule     . phenytoin (DILANTIN) 100 MG ER capsule Take 2 capsules (200 mg total) by mouth at bedtime. 180 capsule 0  . QUEtiapine (SEROQUEL) 25 MG tablet Take 1  tablet (25 mg total) by mouth every evening. 90 tablet 1  . traMADol (ULTRAM) 50 MG tablet     . clopidogrel (PLAVIX) 75 MG tablet Take 1 tablet (75 mg total) by mouth daily with breakfast. (Patient not taking: Reported on 03/02/2015) 90 tablet 3  . oxyCODONE (ROXICODONE) 5 MG immediate release tablet Take 2 tablets (10 mg total) by mouth every 6 (six) hours as needed for moderate pain. (Patient not taking: Reported on 03/02/2015) 30 tablet 0  . oxyCODONE-acetaminophen (PERCOCET/ROXICET) 5-325 MG per tablet May take 1-2 tabs q 4-6 hrs/ prn/ pain (Patient not taking: Reported on 03/02/2015) 30 tablet 0  . zolpidem (AMBIEN) 10 MG tablet Take 0.5 tablets (5 mg total) by mouth at bedtime as needed (insomnia). 45 tablet 1  . cephALEXin (KEFLEX) 500 MG capsule     . citalopram (  CELEXA) 40 MG tablet TAKE 1 BY MOUTH DAILY 30 tablet 0  . fluconazole (DIFLUCAN) 100 MG tablet      No facility-administered medications prior to visit.    PAST MEDICAL HISTORY: Past Medical History  Diagnosis Date  . Renal disorder     kidney stones  . Toxic encephalopathy   . UTI (lower urinary tract infection)     takes Macrodantin daily  . COPD (chronic obstructive pulmonary disease)   . Hyperlipidemia     takes Atorvastatin daily  . Hypertension     takes Amlodipine and Carvedilol daily  . Subacute delirium 10/14/2013    takes Seroquel and Dilantin nightly  . Alzheimer's dementia     takes Aricept nightly  . Insomnia     takes Ambien nightly  . Anemia   . History of blood transfusion     received 2units on Feb 17,2015  . History of MRSA infection     several yrs ago    PAST SURGICAL HISTORY: Past Surgical History  Procedure Laterality Date  . Abdominal hysterectomy    . Knee surgery    . Bladder surgery    . Insertion of iliac stent Left 01/26/2014    Procedure: INSERTION OF ILIAC STENT- LEFT COMMON ILIAC ARTERY STENT ;  Surgeon: Conrad Shorter, MD;  Location: [facility];  Service: Vascular;  Laterality: Left;   . Cholecystectomy  1995    Gall Bladder  . Amputation Left 02/08/2014    Procedure: AMPUTATION Left 4th & 5th toes;  Surgeon: Conrad Owatonna, MD;  Location: [facility];  Service: Vascular;  Laterality: Left;  . Abdominal angiogram N/A 01/20/2014    Procedure: ABDOMINAL ANGIOGRAM;  Surgeon: Elam Dutch, MD;  Location: [facility] CATH LAB;  Service: Cardiovascular;  Laterality: N/A;    FAMILY HISTORY: Family History  Problem Relation Age of Onset  . Cancer Brother     SOCIAL HISTORY: History   Social History  . Marital Status: Married    Spouse Name: N/A  . Number of Children: N/A  . Years of Education: N/A   Occupational History  . Not on file.   Social History Main Topics  . Smoking status: Current Every Day Smoker -- 0.75 packs/day    Types: Cigarettes  . Smokeless tobacco: Never Used  . Alcohol Use: No  . Drug Use: No  . Sexual Activity: Not on file   Other Topics Concern  . Not on file   Social History Narrative      PHYSICAL EXAM  Filed Vitals:   03/02/15 1131  BP: 146/72  Pulse: 67  Height: 5' (1.524 m)  Weight: 90 lb (40.824 kg)   Body mass index is 17.58 kg/(m^2).  Generalized: Well developed, in no acute distress   Neurological examination  Mentation: Alert. Follows all commands speech and language fluent. MMSE 14/30 Cranial nerve II-XII: Pupils were equal round reactive to light. Extraocular movements were full, visual field were full on confrontational test. Facial sensation and strength were normal. Uvula tongue midline. Head turning and shoulder shrug  were normal and symmetric. Motor: The motor testing reveals 5 over 5 strength of all 4 extremities. Good symmetric motor tone is noted throughout.  Sensory: Sensory testing is intact to soft touch on all 4 extremities. No evidence of extinction is noted.  Coordination: Cerebellar testing reveals good finger-nose-finger and heel-to-shin bilaterally.  Gait and station: Gait is slightly wide-based Tandem gait  is unsteady. Romberg is negative. No drift is seen.  Reflexes: Deep tendon reflexes are symmetric and normal bilaterally.    DIAGNOSTIC DATA (LABS, IMAGING, TESTING) - I reviewed patient records, labs, notes, testing and imaging myself where available.  Lab Results  Component Value Date   WBC 10.6* 02/08/2014   HGB 9.7* 02/08/2014   HCT 31.4* 02/08/2014   MCV 93.2 02/08/2014   PLT 246 02/08/2014      Component Value Date/Time   NA 139 01/27/2014 0415   K 4.0 01/27/2014 0415   CL 107 01/27/2014 0415   CO2 24 01/27/2014 0415   GLUCOSE 96 01/27/2014 0415   BUN 8 01/27/2014 0415   CREATININE 1.00 02/08/2014 1116   CALCIUM 8.2* 01/27/2014 0415   PROT 7.2 10/28/2008 1002   ALBUMIN 3.5 10/28/2008 1002   AST 26 10/28/2008 1002   ALT 20 10/28/2008 1002   ALKPHOS 70 10/28/2008 1002   BILITOT 0.6 10/28/2008 1002   GFRNONAA 42* 02/08/2014 1116   GFRAA 68* 02/08/2014 1116       ASSESSMENT AND PLAN 64 y.o. year old female  has a past medical history of Renal disorder; Toxic encephalopathy; UTI (lower urinary tract infection); COPD (chronic obstructive pulmonary disease); Hyperlipidemia; Hypertension; Subacute delirium (10/14/2013); Alzheimer's dementia; Insomnia; Anemia; History of blood transfusion; and History of MRSA infection. here with:  1. Dementia 2. Insomnia 3. Seizures  Patient's memory has gotten worse. Her MMSE today is 14/30 was previously 20/30. She will continue taking Aricept 10 mg at bedtime. I will start the patient on Namenda. I have reviewed the side effects of Namenda with the patient and her husband. The patient will continue taking Ambien 5 mg at bedtime. I have advised that the patient should try to remain active during the day. I have suggested that the husband give the patient simple activities to complete such as folding clothes. He verbalized understanding. The patient has not had any additional seizures since November. She will continue Dilantin 200 mg at  bedtime. I will check blood work today. If she has any additional seizures they should let us know. She will follow-up in 3-4 months with Dr. Brett Fairy.   Ward Givens, MSN, NP-C 03/02/2015, 11:59 AM [facility] [address] [place], [place] [address] [phone]  Note: This document was prepared with digital dictation and possible smart phrase technology. Any transcriptional errors that result from this process are unintentional.

## 2015-03-02 NOTE — Progress Notes (Signed)
I agree with the assessment and plan as directed by NP .The patient is known to me . Rv every 6 month.    Osiah Haring, MD

## 2015-03-03 ENCOUNTER — Telehealth: Payer: Self-pay | Admitting: Adult Health

## 2015-03-03 LAB — CBC WITH DIFFERENTIAL/PLATELET
Basophils Absolute: 0.1 10*3/uL (ref 0.0–0.2)
Basos: 1 %
EOS ABS: 0.2 10*3/uL (ref 0.0–0.4)
EOS: 2 %
HCT: 34.1 % (ref 34.0–46.6)
Hemoglobin: 10.9 g/dL — ABNORMAL LOW (ref 11.1–15.9)
IMMATURE GRANULOCYTES: 0 %
Immature Grans (Abs): 0 10*3/uL (ref 0.0–0.1)
LYMPHS ABS: 3.5 10*3/uL — AB (ref 0.7–3.1)
Lymphs: 40 %
MCH: 29.2 pg (ref 26.6–33.0)
MCHC: 32 g/dL (ref 31.5–35.7)
MCV: 91 fL (ref 79–97)
MONOS ABS: 0.6 10*3/uL (ref 0.1–0.9)
Monocytes: 7 %
NEUTROS PCT: 50 %
Neutrophils Absolute: 4.5 10*3/uL (ref 1.4–7.0)
Platelets: 208 10*3/uL (ref 150–379)
RBC: 3.73 x10E6/uL — AB (ref 3.77–5.28)
RDW: 15.7 % — ABNORMAL HIGH (ref 12.3–15.4)
WBC: 8.8 10*3/uL (ref 3.4–10.8)

## 2015-03-03 LAB — COMPREHENSIVE METABOLIC PANEL
ALBUMIN: 3.9 g/dL (ref 3.6–4.8)
ALT: 5 IU/L (ref 0–32)
AST: 12 IU/L (ref 0–40)
Albumin/Globulin Ratio: 1.3 (ref 1.1–2.5)
Alkaline Phosphatase: 185 IU/L — ABNORMAL HIGH (ref 39–117)
BUN / CREAT RATIO: 17 (ref 11–26)
BUN: 21 mg/dL (ref 8–27)
CO2: 18 mmol/L (ref 18–29)
CREATININE: 1.26 mg/dL — AB (ref 0.57–1.00)
Calcium: 8.5 mg/dL — ABNORMAL LOW (ref 8.7–10.3)
Chloride: 104 mmol/L (ref 97–108)
GFR calc Af Amer: 52 mL/min/{1.73_m2} — ABNORMAL LOW (ref >59)
GFR calc non Af Amer: 45 mL/min/{1.73_m2} — ABNORMAL LOW (ref >59)
GLOBULIN, TOTAL: 3 g/dL (ref 1.5–4.5)
Glucose: 80 mg/dL (ref 65–99)
Potassium: 4.9 mmol/L (ref 3.5–5.2)
SODIUM: 138 mmol/L (ref 134–144)
Total Protein: 6.9 g/dL (ref 6.0–8.5)

## 2015-03-03 LAB — PHENYTOIN LEVEL, TOTAL: PHENYTOIN LVL: 10.8 ug/mL (ref 10.0–20.0)

## 2015-03-03 NOTE — Telephone Encounter (Signed)
I called the patient and spoke to her husband Marcello Moores. I explained that her lab work was consistent was previous work. Her alkaline phosphatase was elevated which could be due to the Dilantin. We will recheck this in 3 months. If the level continues to increase her Dilantin will have to be discontinued.

## 2015-03-04 ENCOUNTER — Ambulatory Visit: Payer: Medicare Other | Admitting: Family

## 2015-03-04 ENCOUNTER — Inpatient Hospital Stay (HOSPITAL_COMMUNITY)
Admission: RE | Admit: 2015-03-04 | Discharge: 2015-03-04 | Disposition: A | Discharge status: Home / Self Care | Payer: Self-pay | Source: Ambulatory Visit | Attending: Vascular Surgery | Admitting: Vascular Surgery

## 2015-03-04 ENCOUNTER — Encounter (HOSPITAL_COMMUNITY): Payer: Medicare Other

## 2015-03-04 DIAGNOSIS — I743 Embolism and thrombosis of arteries of the lower extremities: Secondary | ICD-10-CM

## 2015-03-17 ENCOUNTER — Other Ambulatory Visit: Payer: Self-pay | Admitting: *Deleted

## 2015-03-17 ENCOUNTER — Encounter: Payer: Self-pay | Admitting: Family

## 2015-03-17 DIAGNOSIS — I739 Peripheral vascular disease, unspecified: Secondary | ICD-10-CM

## 2015-03-17 DIAGNOSIS — Z48812 Encounter for surgical aftercare following surgery on the circulatory system: Secondary | ICD-10-CM

## 2015-03-18 ENCOUNTER — Ambulatory Visit: Payer: Self-pay | Admitting: Family

## 2015-03-18 ENCOUNTER — Inpatient Hospital Stay (HOSPITAL_COMMUNITY): Admission: RE | Admit: 2015-03-18 | Payer: Self-pay | Source: Ambulatory Visit

## 2015-03-18 ENCOUNTER — Encounter (HOSPITAL_COMMUNITY): Payer: Self-pay

## 2015-05-12 ENCOUNTER — Other Ambulatory Visit: Payer: Self-pay

## 2015-05-12 DIAGNOSIS — G40801 Other epilepsy, not intractable, with status epilepticus: Secondary | ICD-10-CM

## 2015-05-12 DIAGNOSIS — R410 Disorientation, unspecified: Secondary | ICD-10-CM

## 2015-05-12 DIAGNOSIS — F0391 Unspecified dementia with behavioral disturbance: Secondary | ICD-10-CM

## 2015-05-12 DIAGNOSIS — F05 Delirium due to known physiological condition: Secondary | ICD-10-CM

## 2015-05-12 DIAGNOSIS — F03918 Unspecified dementia, unspecified severity, with other behavioral disturbance: Secondary | ICD-10-CM

## 2015-05-12 MED ORDER — PHENYTOIN SODIUM EXTENDED 100 MG PO CAPS: 200.0000 mg | ORAL_CAPSULE | Freq: Every day | ORAL | Status: DC

## 2015-05-16 ENCOUNTER — Telehealth: Payer: Self-pay | Admitting: Neurology

## 2015-05-16 ENCOUNTER — Other Ambulatory Visit: Payer: Self-pay | Admitting: Neurology

## 2015-05-16 DIAGNOSIS — G40801 Other epilepsy, not intractable, with status epilepticus: Secondary | ICD-10-CM

## 2015-05-16 DIAGNOSIS — R410 Disorientation, unspecified: Secondary | ICD-10-CM

## 2015-05-16 DIAGNOSIS — F05 Delirium due to known physiological condition: Secondary | ICD-10-CM

## 2015-05-16 DIAGNOSIS — F0391 Unspecified dementia with behavioral disturbance: Secondary | ICD-10-CM

## 2015-05-16 DIAGNOSIS — F03918 Unspecified dementia, unspecified severity, with other behavioral disturbance: Secondary | ICD-10-CM

## 2015-05-16 MED ORDER — PHENYTOIN SODIUM EXTENDED 100 MG PO CAPS: 200.0000 mg | ORAL_CAPSULE | Freq: Every day | ORAL | Status: DC

## 2015-05-16 NOTE — Telephone Encounter (Signed)
I have already spoken with [name].  He is aware Rx was sent.

## 2015-05-16 NOTE — Telephone Encounter (Signed)
Rx was sent to mail order last week.  I called back to clarify if they wanted a partial Rx at [facility] while waiting on mail order.  [name] said he prefers at 49 day Rx sent to [facility].  Prescription has been sent.  They are aware.

## 2015-05-16 NOTE — Telephone Encounter (Signed)
Marcello Moores, pt's husband called back wanting to see if someone can call him back and let him know once the script is sent to the pharmacy. He denies that he had a missed called from [place]. Please call and advise. Marcello Moores can be reached @ [phone]

## 2015-05-16 NOTE — Telephone Encounter (Signed)
Patients husband called and stated that the patient is out of medication and requested that Rx. phenytoin (DILANTIN) 100 MG ER capsule faxed to [id number] [place], [place] by today. Please call and advise.

## 2015-05-30 ENCOUNTER — Other Ambulatory Visit: Payer: Self-pay | Admitting: Neurology

## 2015-05-30 DIAGNOSIS — F03918 Unspecified dementia, unspecified severity, with other behavioral disturbance: Secondary | ICD-10-CM

## 2015-05-30 DIAGNOSIS — G4701 Insomnia due to medical condition: Secondary | ICD-10-CM

## 2015-05-30 DIAGNOSIS — F0391 Unspecified dementia with behavioral disturbance: Secondary | ICD-10-CM

## 2015-05-30 NOTE — Telephone Encounter (Signed)
Rx signed and faxed.

## 2015-06-01 ENCOUNTER — Encounter: Payer: Self-pay | Admitting: Neurology

## 2015-06-01 ENCOUNTER — Ambulatory Visit (INDEPENDENT_AMBULATORY_CARE_PROVIDER_SITE_OTHER): Payer: Medicare Other | Admitting: Neurology

## 2015-06-01 VITALS — BP 110/60 | HR 82 | Resp 20 | Ht 60.63 in | Wt 84.5 lb

## 2015-06-01 DIAGNOSIS — F0391 Unspecified dementia with behavioral disturbance: Secondary | ICD-10-CM

## 2015-06-01 DIAGNOSIS — F05 Delirium due to known physiological condition: Secondary | ICD-10-CM

## 2015-06-01 DIAGNOSIS — G4701 Insomnia due to medical condition: Secondary | ICD-10-CM | POA: Diagnosis not present

## 2015-06-01 DIAGNOSIS — D539 Nutritional anemia, unspecified: Secondary | ICD-10-CM | POA: Diagnosis not present

## 2015-06-01 DIAGNOSIS — F03918 Unspecified dementia, unspecified severity, with other behavioral disturbance: Secondary | ICD-10-CM

## 2015-06-01 DIAGNOSIS — R410 Disorientation, unspecified: Secondary | ICD-10-CM

## 2015-06-01 DIAGNOSIS — G40801 Other epilepsy, not intractable, with status epilepticus: Secondary | ICD-10-CM | POA: Diagnosis not present

## 2015-06-01 MED ORDER — ZOLPIDEM TARTRATE 5 MG PO TABS: 5.0000 mg | ORAL_TABLET | Freq: Every evening | ORAL | Status: AC | PRN

## 2015-06-01 MED ORDER — PHENYTOIN SODIUM EXTENDED 100 MG PO CAPS: 200.0000 mg | ORAL_CAPSULE | Freq: Every day | ORAL | Status: DC

## 2015-06-01 MED ORDER — DONEPEZIL HCL 10 MG PO TABS: ORAL_TABLET | ORAL | Status: DC

## 2015-06-01 MED ORDER — CITALOPRAM HYDROBROMIDE 40 MG PO TABS: 40.0000 mg | ORAL_TABLET | Freq: Every day | ORAL | Status: DC

## 2015-06-01 NOTE — Progress Notes (Signed)
PATIENT: [name] DOB: [birth year]  REASON FOR VISIT: follow up- dementia, insomnia, seizures HISTORY FROM: patient  HISTORY OF PRESENT ILLNESS: [name] is a 64 year old female with a history of dementia, insomnia and seizures. She returns today for follow-up. The patient was last seen in November 2014 by Dr. Roddie Mc. Her husband is with her today and reports that her memory has gotten worse. He states that she forgets where she puts things. She is able to complete ADLs independently however she does require supervision. The patient no longer does household chores. Husband states that she simply has forgotten how to do those things. She does not operate a motor vehicle. The husband does state that she is sleepy throughout the day. He states that most days she will sleep most the day and then stay up at night. He states that she has wondered during the night and therefore he has to stay up as well to provide supervision. She is on Ambien 5 mg and he states that works for about 2 hours and the patient is back up. He states that the patient does not complete many activities during the day. She does take oxycodone and tramadol for pain. The patient has an artificial bladder with a catheter that often gets infected and causes pain. The patient also has a history of seizures. He states that most recently she had 4 seizures in November in a span of two days. She was hospitalized at that time. He states that her seizures consist of her eyes rolling back in her head in her hands shake. Since then the patient has not had any seizure events. She is currently taking Dilantin 200 mg at bedtime. The patient returns today for an evaluation.  HISTORY 10/14/13 [facility]): [name] is a 64 y.o. female Is seen here as a referral/ revisit from Dr.Dough for memory loss, confusion..   64 year old Caucasian right-handed female returns today for follow up. She was last seen in February 2014 and her memory tests  were than stable from last year.  The Mini-Mental test was documented as 20/30 points in February and 21/30 in the previous tests. She continues to live with her husband in an independent setting, she can dress herself , bath herself, she is unable to cook - based on safety concerns. She does minor housework, such as Medical sales representative and all types of cleaning chores. Her mood became increasingly depressed over the last 2 years and Depakote has helped to modulate [name] behavior when she gets irritated and frustrated . She was prescribed Celexa which helped these depression symptoms . On her GDS she had scored 6 points indicating at least a mild, residual depression.  She also likes to take daytime naps.  Patient's primary physician requested this visit after a recent hosptalization. She had UTI and fever, and was severley confused when admitted to hospital.  During her hospitalization at [facility] an EEG was performed , showing left brain originating epileptiform brain discharges ( described as epileptiform without clinical correlate) . The EEG showed periodic discharges- But neither status epilepticus nor subclinical seizures appeared related. The patient was noted to be agitated and In delirium in the setting of this hospitalization . It was felt that this delirium developed due to 1) medication, 2) febrile UTI and 3)the change in environment- all in the setting of worsening dementia - causing behavior changes. She was started on Seroquel 12.5 mg po twice a day with improvement in her agitation. The doses may  need further adjustment. Please note that ciprofloxacin Was used in the treatment of her UTI and several other antibiotics in the same family have a seizure threshold lowering effect. She also had low albumin levels, it was felt that Dilantin would therefor not be a good medication to be continued further.  Patient remains with poor attention , but is not agitated or  impulsive today. She feels " down" . She requested asleep aid, waking up 3-4 times at night. She denies nocturia.  EDS: Epworth is 12 points.   REVIEW OF SYSTEMS: Out of a complete 14 system review of symptoms, the patient complains only of the following symptoms, and all other reviewed systems are negative. The patient reports her appetite to be poor, she also feels subjectively that her memory is declining that her husband has to repeat more and more things over and over for her she misplaces things. However she lives in her household and he still every independent in the activities around the house, she is no longer driving. She is not balancing the checkbook he has never had an interest in it to begin with. Her Mini-Mental Status Examination was 20 out of 30 points. Normal no new seizure activity. Increasing creatinine in a patient this very low muscle mass almost malnourished certainly not sufficiently hydrated. Continues to smoke.   ALLERGIES: Allergies  Allergen Reactions  . Codeine Nausea And Vomiting    REACTION: intolerance  . Latex     itching    HOME MEDICATIONS: Outpatient Prescriptions Prior to Visit  Medication Sig Dispense Refill  . atorvastatin (LIPITOR) 10 MG tablet Take 10 mg by mouth daily.     . carvedilol (COREG) 6.25 MG tablet Take 6.25 mg by mouth daily.     . citalopram (CELEXA) 40 MG tablet TAKE 1 BY MOUTH DAILY --MILLIKAN,MEGAN-- 90 tablet 0  . clopidogrel (PLAVIX) 75 MG tablet Take 75 mg by mouth daily.    Marland Kitchen donepezil (ARICEPT) 10 MG tablet TAKE 1 BY MOUTH AT BEDTIME 90 tablet 3  . memantine (NAMENDA TITRATION PAK) tablet pack 5 mg/day for =1 week; 5 mg twice daily for =1 week; 15 mg/day given in 5 mg and 10 mg separated doses for =1 week; then 10 mg twice daily 49 tablet 0  . oxyCODONE (ROXICODONE) 5 MG immediate release tablet Take 2 tablets (10 mg total) by mouth every 6 (six) hours as needed for moderate pain. 30 tablet 0  . phenytoin (DILANTIN) 100 MG  ER capsule Take 2 capsules (200 mg total) by mouth at bedtime. 180 capsule 0  . traMADol (ULTRAM) 50 MG tablet     . zolpidem (AMBIEN) 10 MG tablet TAKE 1/2 BY MOUTH AT BEDTIME AS NEEDED FOR INSOMNIA 45 tablet 1  . zolpidem (AMBIEN) 5 MG tablet Take 1 tablet (5 mg total) by mouth at bedtime as needed (insomnia). 30 tablet 0   No facility-administered medications prior to visit.    PAST MEDICAL HISTORY: Past Medical History  Diagnosis Date  . Renal disorder     kidney stones  . Toxic encephalopathy   . UTI (lower urinary tract infection)     takes Macrodantin daily  . COPD (chronic obstructive pulmonary disease)   . Hyperlipidemia     takes Atorvastatin daily  . Hypertension     takes Amlodipine and Carvedilol daily  . Subacute delirium 10/14/2013    takes Seroquel and Dilantin nightly  . Alzheimer's dementia     takes Aricept nightly  . Insomnia  takes Ambien nightly  . Anemia   . History of blood transfusion     received 2units on Feb 17,2015  . History of MRSA infection     several yrs ago    PAST SURGICAL HISTORY: Past Surgical History  Procedure Laterality Date  . Abdominal hysterectomy    . Knee surgery    . Bladder surgery    . Insertion of iliac stent Left 01/26/2014    Procedure: INSERTION OF ILIAC STENT- LEFT COMMON ILIAC ARTERY STENT ;  Surgeon: Conrad Lavallette, MD;  Location: [facility];  Service: Vascular;  Laterality: Left;  . Cholecystectomy  1995    Gall Bladder  . Amputation Left 02/08/2014    Procedure: AMPUTATION Left 4th & 5th toes;  Surgeon: Conrad Kalispell, MD;  Location: [facility];  Service: Vascular;  Laterality: Left;  . Abdominal angiogram N/A 01/20/2014    Procedure: ABDOMINAL ANGIOGRAM;  Surgeon: Elam Dutch, MD;  Location: [facility] CATH LAB;  Service: Cardiovascular;  Laterality: N/A;    FAMILY HISTORY: Family History  Problem Relation Age of Onset  . Cancer Brother     SOCIAL HISTORY: History   Social History  . Marital Status: Married    Spouse  Name: N/A  . Number of Children: N/A  . Years of Education: N/A   Occupational History  . Not on file.   Social History Main Topics  . Smoking status: Current Every Day Smoker -- 0.75 packs/day    Types: Cigarettes  . Smokeless tobacco: Never Used  . Alcohol Use: No  . Drug Use: No  . Sexual Activity: Not on file   Other Topics Concern  . Not on file   Social History Narrative      PHYSICAL EXAM  Filed Vitals:   06/01/15 0924  BP: 110/60  Pulse: 82  Resp: 20  Height: 5' 0.63" (1.54 m)  Weight: 84 lb 8 oz (38.329 kg)   Body mass index is 16.16 kg/(m^2).  Generalized: Well developed, in no acute distress   Neurological examination  Mentation: Alert. Follows all commands speech and language fluent. MMSE 14/30 Cranial nerve II-XII: Pupils were equal round reactive to light. Extraocular movements were full, visual field were full on confrontational test. Facial sensation and strength were normal. Uvula tongue midline. Head turning and shoulder shrug  were normal and symmetric. Motor: The motor testing reveals 5 over 5 strength of all 4 extremities. Good symmetric motor tone is noted throughout.  Sensory: Sensory testing is intact to soft touch on all 4 extremities. No evidence of extinction is noted.  Coordination: Cerebellar testing reveals good finger-nose-finger and heel-to-shin bilaterally.  Gait and station: Gait is slightly wide-based Tandem gait is unsteady. Romberg is negative. No drift is seen.  Reflexes: Deep tendon reflexes are symmetric and normal bilaterally.    DIAGNOSTIC DATA (LABS, IMAGING, TESTING) - I reviewed patient records, labs, notes, testing and imaging myself where available.  Lab Results  Component Value Date   WBC 8.8 03/02/2015   HGB 10.9* 03/02/2015   HCT 34.1 03/02/2015   MCV 91 03/02/2015   PLT 208 03/02/2015      Component Value Date/Time   NA 138 03/02/2015 1247   NA 139 01/27/2014 0415   K 4.9 03/02/2015 1247   CL 104  03/02/2015 1247   CO2 18 03/02/2015 1247   GLUCOSE 80 03/02/2015 1247   GLUCOSE 96 01/27/2014 0415   BUN 21 03/02/2015 1247   BUN 8 01/27/2014 0415  CREATININE 1.26* 03/02/2015 1247   CALCIUM 8.5* 03/02/2015 1247   PROT 6.9 03/02/2015 1247   PROT 7.2 10/28/2008 1002   ALBUMIN 3.5 10/28/2008 1002   AST 12 03/02/2015 1247   ALT 5 03/02/2015 1247   ALKPHOS 185* 03/02/2015 1247   BILITOT <0.2 03/02/2015 1247   BILITOT 0.6 10/28/2008 1002   GFRNONAA 73* 03/02/2015 1247   GFRAA 79* 03/02/2015 1247       ASSESSMENT AND PLAN 64 y.o. year old female  has a past medical history of Renal disorder; Toxic encephalopathy; UTI (lower urinary tract infection); COPD (chronic obstructive pulmonary disease); Hyperlipidemia; Hypertension; Subacute delirium (10/14/2013); Alzheimer's dementia; Insomnia; Anemia; History of blood transfusion; and History of MRSA infection. here with:  1. Dementia, thought to reflect Alzheimer's disease. This has been slowly progressive over 12 years. I consider the differential of Lewy body .  2. Insomnia, sleep hygiene. 3. Seizures, controlled on dilantin. 4. UTI , right now again, repeated infections have affected her cognitive function.  5. Ongoing smoking habit , COPD 6. Very slender, almost malnurished.   Patient's memory scores have varied. Her MMSE was again 20-30 , in the  last visit ( 01-11-2015) was 14/30 and was previously 20/30. - More typical for Lewy body dementia, she reports visual hallucinations.  Her sleep has bettered She seems to have a great ability of memory day by day very good days very bad days. Her husband has noted that she repeats herself she asked repeatedly the same question and at times it is hard for him not to lose his temper. In today's MMSE she was able to recall that it is June and summer but not the day of the week or the year. She knows she is in [place] in [place] and that our building is a [employer]. She was not  sure of [place] all the name of the clinic. She repeated 3 words and she was able to spell backwards. She only recalled one of the 3 words. She also was not able to do the clock drawing- the numbers were all shifted to the upper left quarter .   She will continue taking Aricept 10 mg at bedtime and Namenda- the patient and her husband have not noted side effects.. I have advised that the patient should try to remain active during the day.  I have suggested that the husband give the patient simple activities to complete such as folding clothes. The patient lives in a 4 generation household, she is married- lives with her husband and  there are twin granddaughters  ( 44 ) and a great grandson ( 7)  in the house as well.    The patient has not had any additional seizures since November.  She will continue Dilantin 200 mg at bedtime. I will check blood work today. If she has any additional seizures they should let us know. She will follow-up in 4- 6  months with NP.    [facility]    06/01/2015, 9:55 AM [facility] [address] [place], [place] [address] [phone]  Note: This document was prepared with digital dictation and possible smart phrase technology. Any transcriptional errors that result from this process are unintentional.

## 2015-06-02 LAB — COMPREHENSIVE METABOLIC PANEL
A/G RATIO: 1.3 (ref 1.1–2.5)
ALBUMIN: 3.4 g/dL — AB (ref 3.6–4.8)
ALT: 123 IU/L — ABNORMAL HIGH (ref 0–32)
AST: 421 IU/L — ABNORMAL HIGH (ref 0–40)
Alkaline Phosphatase: 239 IU/L — ABNORMAL HIGH (ref 39–117)
BUN / CREAT RATIO: 15 (ref 11–26)
BUN: 20 mg/dL (ref 8–27)
Bilirubin Total: 0.2 mg/dL (ref 0.0–1.2)
CALCIUM: 8.2 mg/dL — AB (ref 8.7–10.3)
CO2: 16 mmol/L — ABNORMAL LOW (ref 18–29)
Chloride: 107 mmol/L (ref 97–108)
Creatinine, Ser: 1.31 mg/dL — ABNORMAL HIGH (ref 0.57–1.00)
GFR calc Af Amer: 50 mL/min/{1.73_m2} — ABNORMAL LOW (ref >59)
GFR calc non Af Amer: 43 mL/min/{1.73_m2} — ABNORMAL LOW (ref >59)
GLOBULIN, TOTAL: 2.7 g/dL (ref 1.5–4.5)
Glucose: 93 mg/dL (ref 65–99)
Potassium: 5 mmol/L (ref 3.5–5.2)
SODIUM: 137 mmol/L (ref 134–144)
Total Protein: 6.1 g/dL (ref 6.0–8.5)

## 2015-06-02 LAB — CBC WITH DIFFERENTIAL/PLATELET
BASOS: 0 %
Basophils Absolute: 0 10*3/uL (ref 0.0–0.2)
EOS (ABSOLUTE): 0.2 10*3/uL (ref 0.0–0.4)
Eos: 2 %
HEMATOCRIT: 33.4 % — AB (ref 34.0–46.6)
HEMOGLOBIN: 10.2 g/dL — AB (ref 11.1–15.9)
Immature Grans (Abs): 0 10*3/uL (ref 0.0–0.1)
Immature Granulocytes: 0 %
LYMPHS: 17 %
Lymphocytes Absolute: 1.3 10*3/uL (ref 0.7–3.1)
MCH: 27.6 pg (ref 26.6–33.0)
MCHC: 30.5 g/dL — ABNORMAL LOW (ref 31.5–35.7)
MCV: 91 fL (ref 79–97)
MONOCYTES: 10 %
Monocytes Absolute: 0.7 10*3/uL (ref 0.1–0.9)
NEUTROS ABS: 5.3 10*3/uL (ref 1.4–7.0)
Neutrophils: 71 %
Platelets: 196 10*3/uL (ref 150–379)
RBC: 3.69 x10E6/uL — ABNORMAL LOW (ref 3.77–5.28)
RDW: 16.4 % — ABNORMAL HIGH (ref 12.3–15.4)
WBC: 7.6 10*3/uL (ref 3.4–10.8)

## 2015-06-02 LAB — CK TOTAL AND CKMB (NOT AT ARMC)
CK-MB Index: <1 ng/mL (ref 0.0–5.3)
Total CK: 27 U/L (ref 24–173)

## 2015-06-03 ENCOUNTER — Ambulatory Visit: Payer: Medicare Other | Admitting: Neurology

## 2015-06-06 ENCOUNTER — Telehealth: Payer: Self-pay

## 2015-06-06 NOTE — Telephone Encounter (Signed)
Spoke to husband re: lab results. Pt asked me to fax results to Dr. Garlon Hatchet and he will make sure pt has a f/u appt with Dr. Garlon Hatchet.

## 2015-06-07 NOTE — Telephone Encounter (Signed)
Dr. Garlon Hatchet wrote back that he is already aware of pt's lab results, thanks.

## 2015-06-23 ENCOUNTER — Other Ambulatory Visit: Payer: Self-pay | Admitting: Family Medicine

## 2015-06-23 DIAGNOSIS — G8929 Other chronic pain: Secondary | ICD-10-CM

## 2015-06-23 DIAGNOSIS — M545 Low back pain, unspecified: Secondary | ICD-10-CM

## 2015-10-03 ENCOUNTER — Encounter: Payer: Self-pay | Admitting: Nurse Practitioner

## 2015-10-03 ENCOUNTER — Ambulatory Visit (INDEPENDENT_AMBULATORY_CARE_PROVIDER_SITE_OTHER): Payer: Medicare Other | Admitting: Nurse Practitioner

## 2015-10-03 VITALS — HR 56 | Ht 61.0 in | Wt 82.4 lb

## 2015-10-03 DIAGNOSIS — F0280 Dementia in other diseases classified elsewhere without behavioral disturbance: Secondary | ICD-10-CM | POA: Diagnosis not present

## 2015-10-03 DIAGNOSIS — G309 Alzheimer's disease, unspecified: Secondary | ICD-10-CM | POA: Diagnosis not present

## 2015-10-03 MED ORDER — MEMANTINE HCL 10 MG PO TABS: 10.0000 mg | ORAL_TABLET | Freq: Two times a day (BID) | ORAL | Status: DC

## 2015-10-03 MED ORDER — DONEPEZIL HCL 10 MG PO TABS: ORAL_TABLET | ORAL | Status: DC

## 2015-10-03 NOTE — Patient Instructions (Addendum)
Continue Dilantin at current dose  Does not need refills Continue Aricept at current dose does not need refills Continue Namenda at current dose Continue Ambien as needed for sleep Follow-up in 6 months

## 2015-10-03 NOTE — Progress Notes (Signed)
I agree with the assessment and plan as directed by NP .The patient is known to me .   Anahis Furgeson, MD  

## 2015-10-03 NOTE — Progress Notes (Signed)
[facility]  PATIENT: [name] DOB: [birth year]   REASON FOR VISIT: Follow-up for dementia, history of seizure disorder, iron deficiency anemia,  HISTORY FROM: Patient    HISTORY OF PRESENT ILLNESS: [name] is a 64year-old female with a history of dementia, insomnia and seizures. She returns today for follow-up. The patient was last seen 06/01/15 by Dr. Brett [name]. Her husband dropped her off for the appointment today.She reports that her memory is about the same. She is able to complete ADLs independently however she does require supervision. The patient no longer does household chores. She does not operate a motor vehicle. She reports that she wakes up several times at night even after taking her Ambien. She denies that she sleeps during the day.  She is on Ambien 5 mg and she states that works for about 4 to 5 hours. She does take oxycodone and tramadol for pain. The patient has an artificial bladder with a catheter that often gets infected and causes pain. The patient also has a history of seizures. Last seizure activity November 2015 .She is currently taking Dilantin 200 mg at bedtime. The patient returns today for an evaluation.her labs are followed by her primary care. According to Dr. Edwena [name] 06/01/15 last note she has had significant liver disease in the past.  HISTORY 10/14/13 [facility]): [name] is a 65 y.o. female Is seen here as a referral/ revisit from Dr.Dough for memory loss, confusion..   64 year old Caucasian right-handed female returns today for follow up. She was last seen in February 2014 and her memory tests were than stable from last year.  The Mini-Mental test was documented as 20/30 points in February and 21/30 in the previous tests. She continues to live with her husband in an independent setting, she can dress herself , bath herself, she is unable to cook - based on safety concerns. She does minor housework, such as Medical sales representative and all  types of cleaning chores. Her mood became increasingly depressed over the last 2 years and Depakote has helped to modulate Mrs. [name] behavior when she gets irritated and frustrated . She was prescribed Celexa which helped these depression symptoms . On her GDS she had scored 6 points indicating at least a mild, residual depression.  She also likes to take daytime naps.  Patient's primary physician requested this visit after a recent hosptalization. She had UTI and fever, and was severley confused when admitted to hospital.  During her hospitalization at [facility] an EEG was performed , showing left brain originating epileptiform brain discharges ( described as epileptiform without clinical correlate) . The EEG showed periodic discharges- But neither status epilepticus nor subclinical seizures appeared related. The patient was noted to be agitated and In delirium in the setting of this hospitalization . It was felt that this delirium developed due to 1) medication, 2) febrile UTI and 3)the change in environment- all in the setting of worsening dementia - causing behavior changes. She was started on Seroquel 12.5 mg po twice a day with improvement in her agitation. The doses may need further adjustment. Please note that ciprofloxacin Was used in the treatment of her UTI and several other antibiotics in the same family have a seizure threshold lowering effect. She also had low albumin levels, it was felt that Dilantin would therefor not be a good medication to be continued further.  Patient remains with poor attention , but is not agitated or impulsive today. She feels " down" . She requested asleep  aid, waking up 3-4 times at night. She denies nocturia.  EDS: Epworth is 12 points.   REVIEW OF SYSTEMS: Full 14 system review of systems performed and notable only for those listed, all others are neg:  Constitutional: Appetite change, fatigue Cardiovascular:  neg Ear/Nose/Throat: neg  Skin: neg Eyes: Blurred vision Respiratory: neg Gastroitestinal: neg  Hematology/Lymphatic: neg  Endocrine: neg Musculoskeletal:neg Allergy/Immunology: Frequent urinary tract infections Neurological: Memory loss, seizure disorder Psychiatric: neg Sleep : Insomnia   ALLERGIES: Allergies  Allergen Reactions  . Codeine Nausea And Vomiting    REACTION: intolerance  . Latex     itching    HOME MEDICATIONS: Outpatient Prescriptions Prior to Visit  Medication Sig Dispense Refill  . atorvastatin (LIPITOR) 10 MG tablet Take 10 mg by mouth daily.     . carvedilol (COREG) 6.25 MG tablet Take 6.25 mg by mouth daily.     . citalopram (CELEXA) 40 MG tablet Take 1 tablet (40 mg total) by mouth daily. 90 tablet 3  . clopidogrel (PLAVIX) 75 MG tablet Take 75 mg by mouth daily.    Marland Kitchen donepezil (ARICEPT) 10 MG tablet TAKE 1 BY MOUTH AT BEDTIME 90 tablet 3  . memantine (NAMENDA TITRATION PAK) tablet pack 5 mg/day for =1 week; 5 mg twice daily for =1 week; 15 mg/day given in 5 mg and 10 mg separated doses for =1 week; then 10 mg twice daily 49 tablet 0  . oxyCODONE (ROXICODONE) 5 MG immediate release tablet Take 2 tablets (10 mg total) by mouth every 6 (six) hours as needed for moderate pain. 30 tablet 0  . phenytoin (DILANTIN) 100 MG ER capsule Take 2 capsules (200 mg total) by mouth at bedtime. 180 capsule 3  . zolpidem (AMBIEN) 10 MG tablet TAKE 1/2 BY MOUTH AT BEDTIME AS NEEDED FOR INSOMNIA 45 tablet 1   No facility-administered medications prior to visit.    PAST MEDICAL HISTORY: Past Medical History  Diagnosis Date  . Renal disorder     kidney stones  . Toxic encephalopathy   . UTI (lower urinary tract infection)     takes Macrodantin daily  . COPD (chronic obstructive pulmonary disease) ([place])   . Hyperlipidemia     takes Atorvastatin daily  . Hypertension     takes Amlodipine and Carvedilol daily  . Subacute delirium 10/14/2013    takes Seroquel and  Dilantin nightly  . Alzheimer's dementia     takes Aricept nightly  . Insomnia     takes Ambien nightly  . Anemia   . History of blood transfusion     received 2units on Feb 17,2015  . History of MRSA infection     several yrs ago    PAST SURGICAL HISTORY: Past Surgical History  Procedure Laterality Date  . Abdominal hysterectomy    . Knee surgery    . Bladder surgery    . Insertion of iliac stent Left 01/26/2014    Procedure: INSERTION OF ILIAC STENT- LEFT COMMON ILIAC ARTERY STENT ;  Surgeon: Conrad Sebastopol, MD;  Location: [facility];  Service: Vascular;  Laterality: Left;  . Cholecystectomy  1995    Gall Bladder  . Amputation Left 02/08/2014    Procedure: AMPUTATION Left 4th & 5th toes;  Surgeon: Conrad Hillsdale, MD;  Location: [facility];  Service: Vascular;  Laterality: Left;  . Abdominal angiogram N/A 01/20/2014    Procedure: ABDOMINAL ANGIOGRAM;  Surgeon: Elam Dutch, MD;  Location: [facility] CATH LAB;  Service: Cardiovascular;  Laterality:  N/A;    FAMILY HISTORY: Family History  Problem Relation Age of Onset  . Cancer Brother     SOCIAL HISTORY: Social History   Social History  . Marital Status: Married    Spouse Name: N/A  . Number of Children: N/A  . Years of Education: N/A   Occupational History  . Not on file.   Social History Main Topics  . Smoking status: Current Every Day Smoker -- 0.75 packs/day    Types: Cigarettes  . Smokeless tobacco: Never Used  . Alcohol Use: No  . Drug Use: No  . Sexual Activity: Not on file   Other Topics Concern  . Not on file   Social History Narrative     PHYSICAL EXAM  Filed Vitals:   10/03/15 0801  BP: 128/67  Pulse: 56  Height: 5\' 1"  (1.549 m)  Weight: 82 lb 6.4 oz (37.376 kg)   Body mass index is 15.58 kg/(m^2). Generalized: Well developed, in no acute distress   Neurological examination  Mentation: Alert. Follows all commands speech and language fluent. MMSE 13/30. Last 14/30. AFT 6. Clock drawing 4/4. Cranial  nerve II-XII: Pupils were equal round reactive to light. Extraocular movements were full, visual field were full on confrontational test. Facial sensation and strength were normal. Uvula tongue midline. Head turning and shoulder shrug were normal and symmetric. Motor: The motor testing reveals 5 over 5 strength of all 4 extremities. Good symmetric motor tone is noted throughout.  Sensory: Sensory testing is intact to soft touch on all 4 extremities. No evidence of extinction is noted.  Coordination: Cerebellar testing reveals good finger-nose-finger and heel-to-shin bilaterally.  Gait and station: Gait is slightly wide-based Tandem gait is unsteady. Romberg is negative. No drift is seen. No assistive device Reflexes: Deep tendon reflexes are symmetric and normal bilaterally.  DIAGNOSTIC DATA (LABS, IMAGING, TESTING) - I reviewed patient records, labs, notes, testing and imaging myself where available.  Lab Results  Component Value Date   WBC 7.6 06/01/2015   HGB 10.9* 03/02/2015   HCT 33.4* 06/01/2015   MCV 91 03/02/2015   PLT 208 03/02/2015      Component Value Date/Time   NA 137 06/01/2015 1032   NA 139 01/27/2014 0415   K 5.0 06/01/2015 1032   CL 107 06/01/2015 1032   CO2 16* 06/01/2015 1032   GLUCOSE 93 06/01/2015 1032   GLUCOSE 96 01/27/2014 0415   BUN 20 06/01/2015 1032   BUN 8 01/27/2014 0415   CREATININE 1.31* 06/01/2015 1032   CALCIUM 8.2* 06/01/2015 1032   PROT 6.1 06/01/2015 1032   PROT 7.2 10/28/2008 1002   ALBUMIN 3.4* 06/01/2015 1032   ALBUMIN 3.5 10/28/2008 1002   AST 421* 06/01/2015 1032   ALT 123* 06/01/2015 1032   ALKPHOS 239* 06/01/2015 1032   BILITOT 0.2 06/01/2015 1032   BILITOT 0.6 10/28/2008 1002   GFRNONAA 80* 06/01/2015 1032   GFRAA 50* 06/01/2015 1032     ASSESSMENT AND PLAN  64 y.o. year old female  has a past medical history of Renal disorder; Toxic encephalopathy; UTI (lower urinary tract infection); COPD (chronic obstructive pulmonary  disease) ([place]); Hyperlipidemia; Hypertension; Subacute delirium (10/14/2013); Alzheimer's dementia; Insomnia; Anemia; and History of MRSA infection. here to follow-up.  Continue Dilantin at current dose  Continue Aricept at current dose  Continue Namenda at current dose Continue Ambien as needed for sleep Follow-up in 6 months with Ward Givens who has seen her in the past Dennie Bible, [place], [facility],  APRN  [facility] [address] [place], [place] [address] [phone] Patient

## 2015-10-18 DIAGNOSIS — G8929 Other chronic pain: Secondary | ICD-10-CM | POA: Diagnosis not present

## 2015-10-18 DIAGNOSIS — Z23 Encounter for immunization: Secondary | ICD-10-CM | POA: Diagnosis not present

## 2015-10-18 DIAGNOSIS — J309 Allergic rhinitis, unspecified: Secondary | ICD-10-CM | POA: Diagnosis not present

## 2015-10-18 DIAGNOSIS — F17200 Nicotine dependence, unspecified, uncomplicated: Secondary | ICD-10-CM | POA: Diagnosis not present

## 2015-10-19 ENCOUNTER — Telehealth: Payer: Self-pay | Admitting: Adult Health

## 2015-10-19 NOTE — Telephone Encounter (Signed)
[name] with Dr Bridgett Larsson office inquiring if clopidogrel (PLAVIX) 75 MG tablet has been ordered by [place]. I told her I could have her RN call her back but she said she would forward to [place] thru [facility].

## 2015-10-20 ENCOUNTER — Telehealth: Payer: Self-pay

## 2015-10-20 NOTE — Telephone Encounter (Signed)
A prescription request was received from [facility] for clopidogrel 75 mg. The request has been denied due to the fact that the patient has not been seen by Dr. Bridgett Larsson since 08/2014. The patient has no showed the last two appointments for this year. Our office will try to contact this patient to schedule an appointment.

## 2015-10-26 ENCOUNTER — Telehealth: Payer: Self-pay | Admitting: Surgery

## 2015-10-26 NOTE — Telephone Encounter (Signed)
-----   Message from Reola Calkins, [place] sent at 10/26/2015  3:47 PM EST ----- Thanks Hinton Dyer! ----- Message -----    From: Gena Fray    Sent: 10/26/2015  10:59 AM      To: Bard Herbert Maness-Harrison, CMA  Good morning Chanda,  I tried to call Ms Peraino several times to reschedule. However, she does not have a voicemail and has not called back. It looks like we actually have sent her no show letters in the past as well.  Thanks, Hinton Dyer ----- Message -----    From: Reola Calkins, CMA    Sent: 10/20/2015  11:57 AM      To: [facility]  Can we try to reschedule an appointment for this patient. She has no showed for the last two this year.

## 2015-12-21 DIAGNOSIS — Z789 Other specified health status: Secondary | ICD-10-CM | POA: Diagnosis not present

## 2015-12-21 DIAGNOSIS — G8929 Other chronic pain: Secondary | ICD-10-CM | POA: Diagnosis not present

## 2015-12-21 DIAGNOSIS — N183 Chronic kidney disease, stage 3 (moderate): Secondary | ICD-10-CM | POA: Diagnosis not present

## 2015-12-21 DIAGNOSIS — Z9889 Other specified postprocedural states: Secondary | ICD-10-CM | POA: Diagnosis not present

## 2016-02-21 DIAGNOSIS — Z681 Body mass index (BMI) 19 or less, adult: Secondary | ICD-10-CM | POA: Diagnosis not present

## 2016-02-21 DIAGNOSIS — G8929 Other chronic pain: Secondary | ICD-10-CM | POA: Diagnosis not present

## 2016-02-21 DIAGNOSIS — M25551 Pain in right hip: Secondary | ICD-10-CM | POA: Diagnosis not present

## 2016-04-02 ENCOUNTER — Encounter: Payer: Self-pay | Admitting: Adult Health

## 2016-04-02 ENCOUNTER — Ambulatory Visit (INDEPENDENT_AMBULATORY_CARE_PROVIDER_SITE_OTHER): Payer: Medicare Other | Admitting: Adult Health

## 2016-04-02 ENCOUNTER — Ambulatory Visit: Payer: Medicare Other | Admitting: Nurse Practitioner

## 2016-04-02 VITALS — BP 151/63 | HR 69 | Ht 61.0 in | Wt 86.2 lb

## 2016-04-02 DIAGNOSIS — Z5181 Encounter for therapeutic drug level monitoring: Secondary | ICD-10-CM | POA: Diagnosis not present

## 2016-04-02 DIAGNOSIS — Z79899 Other long term (current) drug therapy: Secondary | ICD-10-CM | POA: Diagnosis not present

## 2016-04-02 DIAGNOSIS — R569 Unspecified convulsions: Secondary | ICD-10-CM

## 2016-04-02 DIAGNOSIS — G4700 Insomnia, unspecified: Secondary | ICD-10-CM | POA: Diagnosis not present

## 2016-04-02 DIAGNOSIS — G308 Other Alzheimer's disease: Secondary | ICD-10-CM

## 2016-04-02 DIAGNOSIS — F0280 Dementia in other diseases classified elsewhere without behavioral disturbance: Secondary | ICD-10-CM

## 2016-04-02 NOTE — Progress Notes (Signed)
PATIENT: [name] DOB: [birth year]  REASON FOR VISIT: follow up- dementia, insomnia, seizures HISTORY FROM: patient  HISTORY OF PRESENT ILLNESS: [name] is a 65 year old female with a history of dementia, insomnia and seizures. She returns today for follow-up. She has continued to take Aricept and Namenda and is tolerating it well. She requires some assistance with ADLs. She does not operate a motor vehicle. She states that she does have trouble with her balance. She reports that she had a fall 3 months ago when she was walking up her back step she tripped and fell. fortunately she did not suffer any injuries. She denies any recent seizure events. Her husband reports that she had a seizure 7 -8 months ago when she was in [facility] for a bladder infection. He reports that she's never had a seizure at home. The patient reports that she continues to have trouble sleeping. In the past she has been on Ambien but was recently taken off of this medication according to the patient. She also uses oxycodone for severe pain. She returns today for an evaluation.  HISTORY 10/03/15 (CM): [name] is a 65year-old female with a history of dementia, insomnia and seizures. She returns today for follow-up. The patient was last seen 06/01/15 by Dr. Brett Fairy. Her husband dropped her off for the appointment today.She reports that her memory is about the same. She is able to complete ADLs independently however she does require supervision. The patient no longer does household chores. She does not operate a motor vehicle. She reports that she wakes up several times at night even after taking her Ambien. She denies that she sleeps during the day. She is on Ambien 5 mg and she states that works for about 4 to 5 hours. She does take oxycodone and tramadol for pain. The patient has an artificial bladder with a catheter that often gets infected and causes pain. The patient also has a history of seizures. Last seizure  activity November 2015 .She is currently taking Dilantin 200 mg at bedtime. The patient returns today for an evaluation.her labs are followed by her primary care. According to Dr. Edwena Felty 06/01/15 last note she has had significant liver disease in the past.  HISTORY 10/14/13 [facility]): [name] is a 65 y.o. female Is seen here as a referral/ revisit from Dr.Dough for memory loss, confusion..   65 year old Caucasian right-handed female returns today for follow up. She was last seen in February 2014 and her memory tests were than stable from last year.  The Mini-Mental test was documented as 20/30 points in February and 21/30 in the previous tests. She continues to live with her husband in an independent setting, she can dress herself , bath herself, she is unable to cook - based on safety concerns. She does minor housework, such as Medical sales representative and all types of cleaning chores. Her mood became increasingly depressed over the last 2 years and Depakote has helped to modulate [name] behavior when she gets irritated and frustrated . She was prescribed Celexa which helped these depression symptoms . On her GDS she had scored 6 points indicating at least a mild, residual depression.  She also likes to take daytime naps.  Patient's primary physician requested this visit after a recent hosptalization. She had UTI and fever, and was severley confused when admitted to hospital.  During her hospitalization at [facility] an EEG was performed , showing left brain originating epileptiform brain discharges ( described as epileptiform without  clinical correlate) . The EEG showed periodic discharges- But neither status epilepticus nor subclinical seizures appeared related. The patient was noted to be agitated and In delirium in the setting of this hospitalization . It was felt that this delirium developed due to 1) medication, 2) febrile UTI and 3)the change in environment- all in the setting of  worsening dementia - causing behavior changes. She was started on Seroquel 12.5 mg po twice a day with improvement in her agitation. The doses may need further adjustment. Please note that ciprofloxacin Was used in the treatment of her UTI and several other antibiotics in the same family have a seizure threshold lowering effect. She also had low albumin levels, it was felt that Dilantin would therefor not be a good medication to be continued further.  Patient remains with poor attention , but is not agitated or impulsive today. She feels " down" . She requested asleep aid, waking up 3-4 times at night. She denies nocturia.  EDS: Epworth is 12 points  REVIEW OF SYSTEMS: Out of a complete 14 system review of symptoms, the patient complains only of the following symptoms, and all other reviewed systems are negative.  Chills, fatigue, wheezing, shortness of breath, leg swelling, diarrhea, abdominal pain, insomnia, frequent waking, daytime sleepiness, sleep talking, joint pain, back pain, aching muscles, walking difficulty, neck stiffness, confusion, depression, nervous/anxious, speech difficulty, headache, dizziness, memory loss  ALLERGIES: Allergies  Allergen Reactions  . Codeine Nausea And Vomiting    REACTION: intolerance  . Latex     itching    HOME MEDICATIONS: Outpatient Prescriptions Prior to Visit  Medication Sig Dispense Refill  . atorvastatin (LIPITOR) 10 MG tablet Take 10 mg by mouth daily.     . citalopram (CELEXA) 40 MG tablet Take 1 tablet (40 mg total) by mouth daily. 90 tablet 3  . clopidogrel (PLAVIX) 75 MG tablet Take 75 mg by mouth daily.    Marland Kitchen donepezil (ARICEPT) 10 MG tablet TAKE 1 BY MOUTH AT BEDTIME 90 tablet 1  . memantine (NAMENDA) 10 MG tablet Take 1 tablet (10 mg total) by mouth 2 (two) times daily. 180 tablet 1  . oxyCODONE (ROXICODONE) 5 MG immediate release tablet Take 2 tablets (10 mg total) by mouth every 6 (six) hours as needed for moderate pain.  (Patient taking differently: Take 5 mg by mouth every 6 (six) hours as needed for moderate pain. ) 30 tablet 0  . phenytoin (DILANTIN) 100 MG ER capsule Take 2 capsules (200 mg total) by mouth at bedtime. 180 capsule 3  . carvedilol (COREG) 6.25 MG tablet Take 6.25 mg by mouth daily.     Marland Kitchen zolpidem (AMBIEN) 10 MG tablet TAKE 1/2 BY MOUTH AT BEDTIME AS NEEDED FOR INSOMNIA 45 tablet 1   No facility-administered medications prior to visit.    PAST MEDICAL HISTORY: Past Medical History  Diagnosis Date  . Renal disorder     kidney stones  . Toxic encephalopathy   . UTI (lower urinary tract infection)     takes Macrodantin daily  . COPD (chronic obstructive pulmonary disease) ([place])   . Hyperlipidemia     takes Atorvastatin daily  . Hypertension     takes Amlodipine and Carvedilol daily  . Subacute delirium 10/14/2013    takes Seroquel and Dilantin nightly  . Alzheimer's dementia     takes Aricept nightly  . Insomnia     takes Ambien nightly  . Anemia   . History of blood transfusion  received 2units on Feb 17,2015  . History of MRSA infection     several yrs ago    PAST SURGICAL HISTORY: Past Surgical History  Procedure Laterality Date  . Abdominal hysterectomy    . Knee surgery    . Bladder surgery    . Insertion of iliac stent Left 01/26/2014    Procedure: INSERTION OF ILIAC STENT- LEFT COMMON ILIAC ARTERY STENT ;  Surgeon: Conrad Thatcher, MD;  Location: [facility];  Service: Vascular;  Laterality: Left;  . Cholecystectomy  1995    Gall Bladder  . Amputation Left 02/08/2014    Procedure: AMPUTATION Left 4th & 5th toes;  Surgeon: Conrad Fairview, MD;  Location: [place];  Service: Vascular;  Laterality: Left;  . Abdominal angiogram N/A 01/20/2014    Procedure: ABDOMINAL ANGIOGRAM;  Surgeon: Elam Dutch, MD;  Location: [facility] CATH LAB;  Service: Cardiovascular;  Laterality: N/A;    FAMILY HISTORY: Family History  Problem Relation Age of Onset  . Cancer Brother     SOCIAL  HISTORY: Social History   Social History  . Marital Status: Married    Spouse Name: N/A  . Number of Children: N/A  . Years of Education: N/A   Occupational History  . Not on file.   Social History Main Topics  . Smoking status: Current Every Day Smoker -- 0.75 packs/day    Types: Cigarettes  . Smokeless tobacco: Never Used  . Alcohol Use: No  . Drug Use: No  . Sexual Activity: Not on file   Other Topics Concern  . Not on file   Social History Narrative   Living with husband at home.        PHYSICAL EXAM  Filed Vitals:   04/02/16 0908  BP: 151/63  Pulse: 69  Height: 5\' 1"  (1.549 m)  Weight: 86 lb 3.2 oz (39.1 kg)   Body mass index is 16.3 kg/(m^2).   MMSE - Mini Mental State Exam 04/02/2016  Orientation to time 1  Orientation to Place 4  Registration 3  Attention/ Calculation 0  Recall 1  Language- name 2 objects 2  Language- repeat 1  Language- follow 3 step command 2  Language- read & follow direction 1  Write a sentence 1  Copy design 1  Total score 17     Generalized: Well developed, in no acute distress   Neurological examination  Mentation: Alert. Follows all commands speech and language fluent Cranial nerve II-XII: Pupils were equal round reactive to light. Extraocular movements were full, visual field were full on confrontational test. Facial sensation and strength were normal. Uvula tongue midline. Head turning and shoulder shrug  were normal and symmetric. Motor: The motor testing reveals 5 over 5 strength of all 4 extremities. Good symmetric motor tone is noted throughout.  Sensory: Sensory testing is intact to soft touch on all 4 extremities. No evidence of extinction is noted.  Coordination: Cerebellar testing reveals good finger-nose-finger and heel-to-shin bilaterally.  Gait and station: Gait is wide-based. Tandem gait not attempted. Romberg is negative. Reflexes: Deep tendon reflexes are symmetric and normal bilaterally.   DIAGNOSTIC  DATA (LABS, IMAGING, TESTING) - I reviewed patient records, labs, notes, testing and imaging myself where available.  Lab Results  Component Value Date   WBC 7.6 06/01/2015   HGB 10.9* 03/02/2015   HCT 33.4* 06/01/2015   MCV 91 06/01/2015   PLT 196 06/01/2015      Component Value Date/Time   NA 137 06/01/2015 1032  NA 139 01/27/2014 0415   K 5.0 06/01/2015 1032   CL 107 06/01/2015 1032   CO2 16* 06/01/2015 1032   GLUCOSE 93 06/01/2015 1032   GLUCOSE 96 01/27/2014 0415   BUN 20 06/01/2015 1032   BUN 8 01/27/2014 0415   CREATININE 1.31* 06/01/2015 1032   CALCIUM 8.2* 06/01/2015 1032   PROT 6.1 06/01/2015 1032   PROT 7.2 10/28/2008 1002   ALBUMIN 3.4* 06/01/2015 1032   ALBUMIN 3.5 10/28/2008 1002   AST 421* 06/01/2015 1032   ALT 123* 06/01/2015 1032   ALKPHOS 239* 06/01/2015 1032   BILITOT 0.2 06/01/2015 1032   BILITOT 0.6 10/28/2008 1002   GFRNONAA 31* 06/01/2015 1032   GFRAA 50* 06/01/2015 1032       ASSESSMENT AND PLAN 65 y.o. year old female  has a past medical history of Renal disorder; Toxic encephalopathy; UTI (lower urinary tract infection); COPD (chronic obstructive pulmonary disease) ([place]); Hyperlipidemia; Hypertension; Subacute delirium (10/14/2013); Alzheimer's dementia; Insomnia; Anemia; History of blood transfusion; and History of MRSA infection. here with:  1. Alzheimer's disease 2. Seizures 3. Insomnia  The patient's memory score has actually improved. She will continue on Aricept and Namenda. She will continue on Dilantin for seizures. I will check blood work today. I have recommended that the patient can try over-the-counter melatonin 1-3 mg before bedtime. She verbalized understanding. She will follow-up in 6 months with Dr. Brett Fairy.     Ward Givens, MSN, NP-C 04/02/2016, 10:06 AM [facility] [address] [place], [place] [address] [phone]

## 2016-04-02 NOTE — Progress Notes (Signed)
I agree with the assessment and plan as directed by NP .The patient is known to me .   [name] Longhi, MD  

## 2016-04-02 NOTE — Patient Instructions (Signed)
Memory score slightly improved Blood work today Continue Aricept and Namenda If your symptoms worsen or you develop new symptoms please let us know.

## 2016-04-03 ENCOUNTER — Telehealth: Payer: Self-pay | Admitting: Adult Health

## 2016-04-03 LAB — CBC WITH DIFFERENTIAL/PLATELET
BASOS ABS: 0 10*3/uL (ref 0.0–0.2)
Basos: 0 %
EOS (ABSOLUTE): 0.1 10*3/uL (ref 0.0–0.4)
Eos: 1 %
Hematocrit: 34.7 % (ref 34.0–46.6)
Hemoglobin: 11 g/dL — ABNORMAL LOW (ref 11.1–15.9)
IMMATURE GRANULOCYTES: 0 %
Immature Grans (Abs): 0 10*3/uL (ref 0.0–0.1)
Lymphocytes Absolute: 3 10*3/uL (ref 0.7–3.1)
Lymphs: 35 %
MCH: 28.7 pg (ref 26.6–33.0)
MCHC: 31.7 g/dL (ref 31.5–35.7)
MCV: 91 fL (ref 79–97)
Monocytes Absolute: 0.7 10*3/uL (ref 0.1–0.9)
Monocytes: 8 %
NEUTROS ABS: 4.7 10*3/uL (ref 1.4–7.0)
NEUTROS PCT: 56 %
PLATELETS: 166 10*3/uL (ref 150–379)
RBC: 3.83 x10E6/uL (ref 3.77–5.28)
RDW: 14.8 % (ref 12.3–15.4)
WBC: 8.5 10*3/uL (ref 3.4–10.8)

## 2016-04-03 LAB — COMPREHENSIVE METABOLIC PANEL
A/G RATIO: 1.4 (ref 1.2–2.2)
ALK PHOS: 164 IU/L — AB (ref 39–117)
ALT: 7 IU/L (ref 0–32)
AST: 12 IU/L (ref 0–40)
Albumin: 3.9 g/dL (ref 3.6–4.8)
BUN/Creatinine Ratio: 16 (ref 12–28)
BUN: 20 mg/dL (ref 8–27)
CHLORIDE: 109 mmol/L — AB (ref 96–106)
CO2: 19 mmol/L (ref 18–29)
Calcium: 8.8 mg/dL (ref 8.7–10.3)
Creatinine, Ser: 1.29 mg/dL — ABNORMAL HIGH (ref 0.57–1.00)
GFR calc Af Amer: 51 mL/min/{1.73_m2} — ABNORMAL LOW (ref >59)
GFR calc non Af Amer: 44 mL/min/{1.73_m2} — ABNORMAL LOW (ref >59)
Globulin, Total: 2.7 g/dL (ref 1.5–4.5)
Glucose: 58 mg/dL — ABNORMAL LOW (ref 65–99)
POTASSIUM: 4.9 mmol/L (ref 3.5–5.2)
Sodium: 144 mmol/L (ref 134–144)
Total Protein: 6.6 g/dL (ref 6.0–8.5)

## 2016-04-03 LAB — PHENYTOIN LEVEL, TOTAL: PHENYTOIN (DILANTIN), SERUM: 5.9 ug/mL — AB (ref 10.0–20.0)

## 2016-04-03 NOTE — Telephone Encounter (Signed)
[name] will you please call patient about Labs see results below. Thanks Hinton Dyer

## 2016-04-03 NOTE — Telephone Encounter (Signed)
-----   Message from Ward Givens, NP sent at 04/03/2016 10:56 AM EDT ----- Lab work is ok. Kidney function is similar to previous lab work. Dilantin level is low but patient has not had any seizures. Please call the patient.

## 2016-04-04 ENCOUNTER — Other Ambulatory Visit: Payer: Self-pay

## 2016-04-04 MED ORDER — MEMANTINE HCL 10 MG PO TABS: 10.0000 mg | ORAL_TABLET | Freq: Two times a day (BID) | ORAL | Status: DC

## 2016-04-04 NOTE — Telephone Encounter (Signed)
Received a notice from [facility] that clarification is needed for the memantine RX. I called pharmacy. I verified with pharmacist that the memantine 10mg  is to be taken one tablet by mouth twice daily, dispense 180 tablets, with 1 refill. Pharmacist verbalized understanding. RX readback was correct and confirmed.

## 2016-04-04 NOTE — Telephone Encounter (Signed)
-----   Message from [place] sent at 04/03/2016  5:00 PM EDT ----- Lab work is ok. Kidney function is similar to previous lab work. Dilantin level is low but patient has not had any seizures. Please call the patient.

## 2016-04-04 NOTE — Telephone Encounter (Signed)
LMVM for pts husband to call back relating to lab results.

## 2016-04-05 NOTE — Telephone Encounter (Signed)
I called and spoke to pt.  Relayed labs ok, kidney function test similar to previous lab work and dilantin level is low but no change in dose, as pt has had no seizures.   She verbalized understanding.

## 2016-04-05 NOTE — Telephone Encounter (Signed)
LMVM  Home # for husband to return call for lab results.

## 2016-04-05 NOTE — Telephone Encounter (Signed)
Husband returned Sandy's call.

## 2016-07-23 ENCOUNTER — Other Ambulatory Visit: Payer: Self-pay

## 2016-07-23 MED ORDER — CITALOPRAM HYDROBROMIDE 40 MG PO TABS: 40.0000 mg | ORAL_TABLET | Freq: Every day | ORAL | 3 refills | Status: DC

## 2016-07-23 NOTE — Telephone Encounter (Signed)
RX faxed to [facility]. Received a receipt of confirmation.

## 2016-08-09 ENCOUNTER — Other Ambulatory Visit: Payer: Self-pay

## 2016-08-09 DIAGNOSIS — F03918 Unspecified dementia, unspecified severity, with other behavioral disturbance: Secondary | ICD-10-CM

## 2016-08-09 DIAGNOSIS — F0391 Unspecified dementia with behavioral disturbance: Secondary | ICD-10-CM

## 2016-08-09 DIAGNOSIS — F05 Delirium due to known physiological condition: Secondary | ICD-10-CM

## 2016-08-09 DIAGNOSIS — G40801 Other epilepsy, not intractable, with status epilepticus: Secondary | ICD-10-CM

## 2016-08-09 DIAGNOSIS — R410 Disorientation, unspecified: Secondary | ICD-10-CM

## 2016-08-09 MED ORDER — PHENYTOIN SODIUM EXTENDED 100 MG PO CAPS: 200.0000 mg | ORAL_CAPSULE | Freq: Every day | ORAL | 3 refills | Status: DC

## 2016-08-20 DIAGNOSIS — N390 Urinary tract infection, site not specified: Secondary | ICD-10-CM | POA: Diagnosis not present

## 2016-08-20 DIAGNOSIS — R339 Retention of urine, unspecified: Secondary | ICD-10-CM | POA: Diagnosis not present

## 2016-08-20 DIAGNOSIS — N319 Neuromuscular dysfunction of bladder, unspecified: Secondary | ICD-10-CM | POA: Diagnosis not present

## 2016-08-21 DIAGNOSIS — L309 Dermatitis, unspecified: Secondary | ICD-10-CM | POA: Diagnosis not present

## 2016-08-21 DIAGNOSIS — Z9181 History of falling: Secondary | ICD-10-CM | POA: Diagnosis not present

## 2016-08-21 DIAGNOSIS — E46 Unspecified protein-calorie malnutrition: Secondary | ICD-10-CM | POA: Diagnosis not present

## 2016-08-21 DIAGNOSIS — E785 Hyperlipidemia, unspecified: Secondary | ICD-10-CM | POA: Diagnosis not present

## 2016-08-21 DIAGNOSIS — F329 Major depressive disorder, single episode, unspecified: Secondary | ICD-10-CM | POA: Diagnosis not present

## 2016-08-21 DIAGNOSIS — F17200 Nicotine dependence, unspecified, uncomplicated: Secondary | ICD-10-CM | POA: Diagnosis not present

## 2016-08-21 DIAGNOSIS — Z139 Encounter for screening, unspecified: Secondary | ICD-10-CM | POA: Diagnosis not present

## 2016-08-21 DIAGNOSIS — I10 Essential (primary) hypertension: Secondary | ICD-10-CM | POA: Diagnosis not present

## 2016-08-21 DIAGNOSIS — Z0000 Encounter for general adult medical examination without abnormal findings: Secondary | ICD-10-CM | POA: Diagnosis not present

## 2016-08-21 DIAGNOSIS — Z7189 Other specified counseling: Secondary | ICD-10-CM | POA: Diagnosis not present

## 2016-09-05 ENCOUNTER — Telehealth: Payer: Self-pay | Admitting: Adult Health

## 2016-09-05 MED ORDER — DONEPEZIL HCL 10 MG PO TABS: ORAL_TABLET | ORAL | 1 refills | Status: DC

## 2016-09-05 NOTE — Telephone Encounter (Signed)
Pt's husband called stating the pt now has medicare. They are requesting all rx be tx to [facility].  Pt currently needs refill on :  Patient requesting refill of  donepezil (ARICEPT) 10 MG tablet Pharmacy:[facility] - [place], [place] - [address]

## 2016-09-05 NOTE — Telephone Encounter (Signed)
Done

## 2016-09-19 DIAGNOSIS — J449 Chronic obstructive pulmonary disease, unspecified: Secondary | ICD-10-CM | POA: Diagnosis not present

## 2016-09-20 DIAGNOSIS — J449 Chronic obstructive pulmonary disease, unspecified: Secondary | ICD-10-CM | POA: Diagnosis not present

## 2016-09-20 DIAGNOSIS — N135 Crossing vessel and stricture of ureter without hydronephrosis: Secondary | ICD-10-CM | POA: Diagnosis not present

## 2016-09-20 DIAGNOSIS — N390 Urinary tract infection, site not specified: Secondary | ICD-10-CM | POA: Diagnosis not present

## 2016-09-27 DIAGNOSIS — F17210 Nicotine dependence, cigarettes, uncomplicated: Secondary | ICD-10-CM | POA: Diagnosis not present

## 2016-09-27 DIAGNOSIS — Z86718 Personal history of other venous thrombosis and embolism: Secondary | ICD-10-CM | POA: Diagnosis not present

## 2016-09-27 DIAGNOSIS — N135 Crossing vessel and stricture of ureter without hydronephrosis: Secondary | ICD-10-CM | POA: Diagnosis not present

## 2016-09-27 DIAGNOSIS — J449 Chronic obstructive pulmonary disease, unspecified: Secondary | ICD-10-CM | POA: Diagnosis not present

## 2016-09-27 DIAGNOSIS — Z79899 Other long term (current) drug therapy: Secondary | ICD-10-CM | POA: Diagnosis not present

## 2016-09-27 DIAGNOSIS — R109 Unspecified abdominal pain: Secondary | ICD-10-CM | POA: Diagnosis not present

## 2016-09-27 DIAGNOSIS — Z8673 Personal history of transient ischemic attack (TIA), and cerebral infarction without residual deficits: Secondary | ICD-10-CM | POA: Diagnosis not present

## 2016-09-27 DIAGNOSIS — E7800 Pure hypercholesterolemia, unspecified: Secondary | ICD-10-CM | POA: Diagnosis not present

## 2016-09-27 DIAGNOSIS — Z7902 Long term (current) use of antithrombotics/antiplatelets: Secondary | ICD-10-CM | POA: Diagnosis not present

## 2016-09-27 DIAGNOSIS — N131 Hydronephrosis with ureteral stricture, not elsewhere classified: Secondary | ICD-10-CM | POA: Diagnosis not present

## 2016-09-28 DIAGNOSIS — K649 Unspecified hemorrhoids: Secondary | ICD-10-CM | POA: Diagnosis not present

## 2016-09-28 DIAGNOSIS — Z681 Body mass index (BMI) 19 or less, adult: Secondary | ICD-10-CM | POA: Diagnosis not present

## 2016-10-03 ENCOUNTER — Ambulatory Visit: Payer: Medicare Other | Admitting: Neurology

## 2016-10-10 DIAGNOSIS — R748 Abnormal levels of other serum enzymes: Secondary | ICD-10-CM | POA: Diagnosis not present

## 2016-10-10 DIAGNOSIS — Z681 Body mass index (BMI) 19 or less, adult: Secondary | ICD-10-CM | POA: Diagnosis not present

## 2016-10-10 DIAGNOSIS — L720 Epidermal cyst: Secondary | ICD-10-CM | POA: Diagnosis not present

## 2016-10-16 ENCOUNTER — Telehealth: Payer: Self-pay | Admitting: Adult Health

## 2016-10-16 NOTE — Telephone Encounter (Signed)
Spouse called to advise of pharmacy change to [facility] in [place] and to request refills of citalopram (CELEXA) 40 MG tablet and memantine (NAMENDA) 10 MG tablet to [facility] in [place].

## 2016-10-17 DIAGNOSIS — N183 Chronic kidney disease, stage 3 (moderate): Secondary | ICD-10-CM | POA: Diagnosis not present

## 2016-10-17 DIAGNOSIS — E872 Acidosis: Secondary | ICD-10-CM | POA: Diagnosis not present

## 2016-10-17 DIAGNOSIS — K580 Irritable bowel syndrome with diarrhea: Secondary | ICD-10-CM | POA: Diagnosis not present

## 2016-10-17 NOTE — Telephone Encounter (Signed)
I called [facility], patient last had Citalopram filled in August. I cancelled further refills with them.

## 2016-10-17 NOTE — Telephone Encounter (Signed)
Ok to send Citalopram and namenda to [facility]?

## 2016-10-17 NOTE — Telephone Encounter (Signed)
Please do send to [facility] . CD

## 2016-10-17 NOTE — Telephone Encounter (Signed)
Ok to send Citalopram and

## 2016-10-18 MED ORDER — MEMANTINE HCL 10 MG PO TABS: 10.0000 mg | ORAL_TABLET | Freq: Two times a day (BID) | ORAL | 1 refills | Status: DC

## 2016-10-18 MED ORDER — CITALOPRAM HYDROBROMIDE 40 MG PO TABS: 40.0000 mg | ORAL_TABLET | Freq: Every day | ORAL | 1 refills | Status: DC

## 2016-10-18 NOTE — Telephone Encounter (Signed)
Citalopram Rx is signed and faxed to [facility].

## 2016-10-18 NOTE — Addendum Note (Signed)
Addended by: Leighton Parody S on: 10/18/2016 08:00 AM   Modules accepted: Orders

## 2016-10-18 NOTE — Telephone Encounter (Signed)
Namenda sent to [facility] via [facility]. Celexa printed and on desk for signature.

## 2016-10-22 DIAGNOSIS — L720 Epidermal cyst: Secondary | ICD-10-CM | POA: Diagnosis not present

## 2016-10-26 DIAGNOSIS — N390 Urinary tract infection, site not specified: Secondary | ICD-10-CM | POA: Diagnosis not present

## 2016-10-26 DIAGNOSIS — R339 Retention of urine, unspecified: Secondary | ICD-10-CM | POA: Diagnosis not present

## 2016-11-05 DIAGNOSIS — N183 Chronic kidney disease, stage 3 (moderate): Secondary | ICD-10-CM | POA: Diagnosis not present

## 2016-11-05 DIAGNOSIS — F17210 Nicotine dependence, cigarettes, uncomplicated: Secondary | ICD-10-CM | POA: Diagnosis not present

## 2016-11-05 DIAGNOSIS — S4992XA Unspecified injury of left shoulder and upper arm, initial encounter: Secondary | ICD-10-CM | POA: Diagnosis not present

## 2016-11-05 DIAGNOSIS — Z79899 Other long term (current) drug therapy: Secondary | ICD-10-CM | POA: Diagnosis not present

## 2016-11-05 DIAGNOSIS — S46912A Strain of unspecified muscle, fascia and tendon at shoulder and upper arm level, left arm, initial encounter: Secondary | ICD-10-CM | POA: Diagnosis not present

## 2016-11-05 DIAGNOSIS — N2581 Secondary hyperparathyroidism of renal origin: Secondary | ICD-10-CM | POA: Diagnosis not present

## 2016-11-05 DIAGNOSIS — I129 Hypertensive chronic kidney disease with stage 1 through stage 4 chronic kidney disease, or unspecified chronic kidney disease: Secondary | ICD-10-CM | POA: Diagnosis not present

## 2016-11-05 DIAGNOSIS — Z79891 Long term (current) use of opiate analgesic: Secondary | ICD-10-CM | POA: Diagnosis not present

## 2016-11-05 DIAGNOSIS — Z8673 Personal history of transient ischemic attack (TIA), and cerebral infarction without residual deficits: Secondary | ICD-10-CM | POA: Diagnosis not present

## 2016-11-05 DIAGNOSIS — F0390 Unspecified dementia without behavioral disturbance: Secondary | ICD-10-CM | POA: Diagnosis not present

## 2016-11-06 DIAGNOSIS — N183 Chronic kidney disease, stage 3 (moderate): Secondary | ICD-10-CM | POA: Diagnosis not present

## 2016-11-06 DIAGNOSIS — K580 Irritable bowel syndrome with diarrhea: Secondary | ICD-10-CM | POA: Diagnosis not present

## 2016-11-06 DIAGNOSIS — J449 Chronic obstructive pulmonary disease, unspecified: Secondary | ICD-10-CM | POA: Diagnosis not present

## 2016-11-06 DIAGNOSIS — R05 Cough: Secondary | ICD-10-CM | POA: Diagnosis not present

## 2016-11-09 DIAGNOSIS — M5412 Radiculopathy, cervical region: Secondary | ICD-10-CM | POA: Diagnosis not present

## 2016-11-09 DIAGNOSIS — S161XXA Strain of muscle, fascia and tendon at neck level, initial encounter: Secondary | ICD-10-CM | POA: Diagnosis not present

## 2016-11-12 DIAGNOSIS — N2581 Secondary hyperparathyroidism of renal origin: Secondary | ICD-10-CM | POA: Diagnosis not present

## 2016-11-12 DIAGNOSIS — N183 Chronic kidney disease, stage 3 (moderate): Secondary | ICD-10-CM | POA: Diagnosis not present

## 2016-11-12 DIAGNOSIS — I10 Essential (primary) hypertension: Secondary | ICD-10-CM | POA: Diagnosis not present

## 2016-11-12 DIAGNOSIS — D631 Anemia in chronic kidney disease: Secondary | ICD-10-CM | POA: Diagnosis not present

## 2016-11-16 DIAGNOSIS — M25511 Pain in right shoulder: Secondary | ICD-10-CM | POA: Diagnosis not present

## 2016-11-16 DIAGNOSIS — M79641 Pain in right hand: Secondary | ICD-10-CM | POA: Diagnosis not present

## 2016-11-16 DIAGNOSIS — M79642 Pain in left hand: Secondary | ICD-10-CM | POA: Diagnosis not present

## 2016-11-16 DIAGNOSIS — Z681 Body mass index (BMI) 19 or less, adult: Secondary | ICD-10-CM | POA: Diagnosis not present

## 2016-11-27 DIAGNOSIS — N390 Urinary tract infection, site not specified: Secondary | ICD-10-CM | POA: Diagnosis not present

## 2016-11-27 DIAGNOSIS — N135 Crossing vessel and stricture of ureter without hydronephrosis: Secondary | ICD-10-CM | POA: Diagnosis not present

## 2016-11-27 DIAGNOSIS — N319 Neuromuscular dysfunction of bladder, unspecified: Secondary | ICD-10-CM | POA: Diagnosis not present

## 2016-11-27 DIAGNOSIS — R339 Retention of urine, unspecified: Secondary | ICD-10-CM | POA: Diagnosis not present

## 2016-11-28 ENCOUNTER — Encounter: Payer: Self-pay | Admitting: Neurology

## 2016-11-28 ENCOUNTER — Ambulatory Visit (INDEPENDENT_AMBULATORY_CARE_PROVIDER_SITE_OTHER): Payer: Medicare Other | Admitting: Neurology

## 2016-11-28 VITALS — BP 182/68 | HR 82 | Resp 18 | Ht 61.0 in | Wt 99.0 lb

## 2016-11-28 DIAGNOSIS — F5101 Primary insomnia: Secondary | ICD-10-CM

## 2016-11-28 DIAGNOSIS — G300 Alzheimer's disease with early onset: Secondary | ICD-10-CM

## 2016-11-28 DIAGNOSIS — R410 Disorientation, unspecified: Secondary | ICD-10-CM

## 2016-11-28 DIAGNOSIS — F0280 Dementia in other diseases classified elsewhere without behavioral disturbance: Secondary | ICD-10-CM | POA: Diagnosis not present

## 2016-11-28 DIAGNOSIS — F05 Delirium due to known physiological condition: Secondary | ICD-10-CM | POA: Diagnosis not present

## 2016-11-28 DIAGNOSIS — I10 Essential (primary) hypertension: Secondary | ICD-10-CM

## 2016-11-28 DIAGNOSIS — G40801 Other epilepsy, not intractable, with status epilepticus: Secondary | ICD-10-CM

## 2016-11-28 MED ORDER — PHENYTOIN SODIUM EXTENDED 100 MG PO CAPS: 200.0000 mg | ORAL_CAPSULE | Freq: Every day | ORAL | 3 refills | Status: DC

## 2016-11-28 MED ORDER — DOXYLAMINE SUCCINATE (SLEEP) 25 MG PO TABS: 25.0000 mg | ORAL_TABLET | Freq: Every evening | ORAL | Status: DC | PRN

## 2016-11-28 MED ORDER — CITALOPRAM HYDROBROMIDE 40 MG PO TABS: 40.0000 mg | ORAL_TABLET | Freq: Every day | ORAL | 1 refills | Status: DC

## 2016-11-28 MED ORDER — DONEPEZIL HCL 10 MG PO TABS: ORAL_TABLET | ORAL | 1 refills | Status: DC

## 2016-11-28 MED ORDER — MEMANTINE HCL 10 MG PO TABS: 10.0000 mg | ORAL_TABLET | Freq: Two times a day (BID) | ORAL | 1 refills | Status: DC

## 2016-11-28 NOTE — Progress Notes (Signed)
PATIENT: [name] DOB: [birth year]  REASON FOR VISIT: follow up- dementia, insomnia, seizures HISTORY FROM: patient  HISTORY OF PRESENT ILLNESS: [name] is a 65 year old female with a history of dementia, insomnia and seizures. She returns today for follow-up. The patient was last seen in November 2014 by Dr. Roddie Mc. Her husband is with her today and reports that her memory has gotten worse. He states that she forgets where she puts things. She is able to complete ADLs independently however she does require supervision. The patient no longer does household chores. Husband states that she simply has forgotten how to do those things. She does not operate a motor vehicle. The husband does state that she is sleepy throughout the day. He states that most days she will sleep most the day and then stay up at night. He states that she has wondered during the night and therefore he has to stay up as well to provide supervision. She is on Ambien 5 mg and he states that works for about 2 hours and the patient is back up. He states that the patient does not complete many activities during the day. She does take oxycodone and tramadol for pain. The patient has an artificial bladder with a catheter that often gets infected and causes pain. The patient also has a history of seizures. He states that most recently she had 4 seizures in November in a span of two days. She was hospitalized at that time. He states that her seizures consist of her eyes rolling back in her head in her hands shake. Since then the patient has not had any seizure events. She is currently taking Dilantin 200 mg at bedtime. The patient returns today for an evaluation.  HISTORY 10/14/13 [facility]): [name] is a 65 y.o. female Is seen here as a referral/ revisit from Dr.Dough for memory loss, confusion..   65 year old Caucasian right-handed female returns today for follow up. She was last seen in February 2014 and her memory tests  were than stable from last year.  The Mini-Mental test was documented as 20/30 points in February and 21/30 in the previous tests. She continues to live with her husband in an independent setting, she can dress herself , bath herself, she is unable to cook - based on safety concerns. She does minor housework, such as Medical sales representative and all types of cleaning chores. Her mood became increasingly depressed over the last 2 years and Depakote has helped to modulate [name] behavior when she gets irritated and frustrated . She was prescribed Celexa which helped these depression symptoms . On her GDS she had scored 6 points indicating at least a mild, residual depression.  She also likes to take daytime naps.  Patient's primary physician requested this visit after a recent hosptalization. She had UTI and fever, and was severley confused when admitted to hospital.  During her hospitalization at [facility] an EEG was performed , showing left brain originating epileptiform brain discharges ( described as epileptiform without clinical correlate) . The EEG showed periodic discharges- But neither status epilepticus nor subclinical seizures appeared related. The patient was noted to be agitated and In delirium in the setting of this hospitalization . It was felt that this delirium developed due to 1) medication, 2) febrile UTI and 3)the change in environment- all in the setting of worsening dementia - causing behavior changes. She was started on Seroquel 12.5 mg po twice a day with improvement in her agitation. The doses may  need further adjustment. Please note that ciprofloxacin Was used in the treatment of her UTI and several other antibiotics in the same family have a seizure threshold lowering effect. She also had low albumin levels, it was felt that Dilantin would therefor not be a good medication to be continued further.  Patient remains with poor attention , but is not agitated or  impulsive today. She feels " down" . She requested asleep aid, waking up 3-4 times at night. She denies nocturia.  EDS: [place] is 12 points.   REVIEW OF SYSTEMS: Out of a complete 14 system review of symptoms, the patient complains only of the following symptoms, and all other reviewed systems are negative. The patient reports her appetite to be poor, she also feels subjectively that her memory is declining that her husband has to repeat more and more things over and over for her she misplaces things. However she lives in her household and he still every independent in the activities around the house, she is no longer driving. She is not balancing the checkbook he has never had an interest in it to begin with. Her Mini-Mental Status Examination was 20 out of 30 points. Normal no new seizure activity. Increasing creatinine in a patient this very low muscle mass almost malnourished certainly not sufficiently hydrated. Continues to smoke. MMSE - Mini Mental State Exam 11/28/2016 04/02/2016  Orientation to time 3 1  Orientation to Place 2 4  Registration 3 3  Attention/ Calculation 0 0  Recall 0 1  Language- name 2 objects 2 2  Language- repeat 1 1  Language- follow 3 step command 3 2  Language- read & follow direction 1 1  Write a sentence 1 1  Copy design 1 1  Total score 17 17    ALLERGIES: Allergies  Allergen Reactions  . Codeine Nausea And Vomiting    REACTION: intolerance  . Latex     itching    HOME MEDICATIONS: Outpatient Medications Prior to Visit  Medication Sig Dispense Refill  . atorvastatin (LIPITOR) 10 MG tablet Take 10 mg by mouth daily.     . carvedilol (COREG) 12.5 MG tablet   10  . citalopram (CELEXA) 40 MG tablet Take 1 tablet (40 mg total) by mouth daily. 90 tablet 1  . clopidogrel (PLAVIX) 75 MG tablet Take 75 mg by mouth daily.    Marland Kitchen donepezil (ARICEPT) 10 MG tablet TAKE 1 BY MOUTH AT BEDTIME 90 tablet 1  . memantine (NAMENDA) 10 MG tablet Take 1 tablet (10  mg total) by mouth 2 (two) times daily. 180 tablet 1  . phenytoin (DILANTIN) 100 MG ER capsule Take 2 capsules (200 mg total) by mouth at bedtime. 180 capsule 3  . oxyCODONE (ROXICODONE) 5 MG immediate release tablet Take 2 tablets (10 mg total) by mouth every 6 (six) hours as needed for moderate pain. (Patient not taking: Reported on 11/28/2016) 30 tablet 0   No facility-administered medications prior to visit.     PAST MEDICAL HISTORY: Past Medical History:  Diagnosis Date  . Alzheimer's dementia    takes Aricept nightly  . Anemia   . COPD (chronic obstructive pulmonary disease) ([place])   . History of blood transfusion    received 2units on Feb 17,2015  . History of MRSA infection    several yrs ago  . Hyperlipidemia    takes Atorvastatin daily  . Hypertension    takes Amlodipine and Carvedilol daily  . Insomnia    takes Ambien nightly  .  Renal disorder    kidney stones  . Subacute delirium 10/14/2013   takes Seroquel and Dilantin nightly  . Toxic encephalopathy   . UTI (lower urinary tract infection)    takes Macrodantin daily    PAST SURGICAL HISTORY: Past Surgical History:  Procedure Laterality Date  . ABDOMINAL ANGIOGRAM N/A 01/20/2014   Procedure: ABDOMINAL ANGIOGRAM;  Surgeon: Elam Dutch, MD;  Location: [facility] CATH LAB;  Service: Cardiovascular;  Laterality: N/A;  . ABDOMINAL HYSTERECTOMY    . AMPUTATION Left 02/08/2014   Procedure: AMPUTATION Left 4th & 5th toes;  Surgeon: Conrad Greentop, MD;  Location: [facility];  Service: Vascular;  Laterality: Left;  . BLADDER SURGERY    . CHOLECYSTECTOMY  1995   Gall Bladder  . INSERTION OF ILIAC STENT Left 01/26/2014   Procedure: INSERTION OF ILIAC STENT- LEFT COMMON ILIAC ARTERY STENT ;  Surgeon: Conrad Spry, MD;  Location: [facility];  Service: Vascular;  Laterality: Left;  . KNEE SURGERY      FAMILY HISTORY: Family History  Problem Relation Age of Onset  . Cancer Brother     SOCIAL HISTORY: Social History   Social History    . Marital status: Married    Spouse name: N/A  . Number of children: N/A  . Years of education: N/A   Occupational History  . Not on file.   Social History Main Topics  . Smoking status: Current Every Day Smoker    Packs/day: 0.75    Types: Cigarettes  . Smokeless tobacco: Never Used  . Alcohol use No  . Drug use: No  . Sexual activity: Not on file   Other Topics Concern  . Not on file   Social History Narrative   Living with husband at home.        PHYSICAL EXAM  Vitals:   11/28/16 1512  BP: (!) 182/68  Pulse: 82  Resp: 18  Weight: 99 lb (44.9 kg)  Height: 5\' 1"  (1.549 m)   Body mass index is 18.71 kg/m.  Generalized: Well developed, in no acute distress   Neurological examination  Mentation: Alert. Follows all commands speech and language fluent. MMSE 17/30 Cranial nerve;    Impaired smell and taste- Pupils were equal round reactive to light. Extraocular movements were full, visual field were full on confrontational test. Facial sensation and strength were normal. Uvula tongue midline. Head turning and shoulder shrug  were normal and symmetric. Motor:  5 over 5 strength of all 4 extremities. symmetric motor tone is noted throughout.  Sensory:  intact to soft touch on all 4 extremities. No evidence of extinction is noted.  Coordination: intact  finger-nose-finger and heel-to-shin bilaterally.  I had to demontsrate the maneuver to her, she was unable to follow a verbal command.  Gait and station: Gait is slightly wide-based Tandem gait is unsteady.No drift is seen. She stumbles. No falls in the last 3 month.   Reflexes: Deep tendon reflexes are symmetric, BRISK bilaterally.    DIAGNOSTIC DATA (LABS, IMAGING, TESTING) - I reviewed patient records, labs, notes, testing and imaging myself where available.  Lab Results  Component Value Date   WBC 8.5 04/02/2016   HGB 10.9 (L) 03/02/2015   HCT 34.7 04/02/2016   MCV 91 04/02/2016   PLT 166 04/02/2016       Component Value Date/Time   NA 144 04/02/2016 0956   K 4.9 04/02/2016 0956   CL 109 (H) 04/02/2016 0956   CO2 19 04/02/2016 0956  GLUCOSE 58 (L) 04/02/2016 0956   GLUCOSE 96 01/27/2014 0415   BUN 20 04/02/2016 0956   CREATININE 1.29 (H) 04/02/2016 0956   CALCIUM 8.8 04/02/2016 0956   PROT 6.6 04/02/2016 0956   ALBUMIN 3.9 04/02/2016 0956   AST 12 04/02/2016 0956   ALT 7 04/02/2016 0956   ALKPHOS 164 (H) 04/02/2016 0956   BILITOT <0.2 04/02/2016 0956   GFRNONAA 44 (L) 04/02/2016 0956   GFRAA 51 (L) 04/02/2016 0956       ASSESSMENT AND PLAN 65 y.o. year old female  has a past medical history of Alzheimer's dementia; Anemia; COPD (chronic obstructive pulmonary disease) ([place]); History of blood transfusion; History of MRSA infection; Hyperlipidemia; Hypertension; Insomnia; Renal disorder; Subacute delirium (10/14/2013); Toxic encephalopathy; and UTI (lower urinary tract infection). here with:  1. Dementia, thought to reflect Alzheimer's disease. This has been slowly progressive over 12 years. I consider the differential of Lewy body .   2. Insomnia, sleep hygiene discussed, she would like a mild sleep aid, I recommend a melatonin.   3. Seizures, controlled on dilantin. Dilantin Refill.   4.cognitive function likely impaired by vascualr disease as well,  Ongoing smoking habit , COPD,  Very slender, almost malnurished.   Patient's memory scores have varied. Her MMSE was again 20-30 , in the  last visit ( 01-11-2015) was 14/30 and was previously 20/30. - More typical for Lewy body dementia, she reports visual hallucinations.  Her sleep has bettered She seems to have a great ability of memory day by day very good days very bad days. Her husband has noted that she repeats herself she asked repeatedly the same question and at times it is hard for him not to lose his temper. In today's MMSE she was able to recall that it is June and summer but not the day of the week or the year.  She knows  she is in [place] in [place] and that our building is a 1 Community education officer.  She was not sure of [place]. She repeated 3 words and she was able to spell backwards.  She only recalled one of the 3 words. She also was not able to do the clock drawing- the numbers were all shifted to the upper left quarter .   She will continue taking Aricept 10 mg at bedtime and Namenda- the patient and her husband have not noted side effects.. I have advised that the patient should try to remain active during the day.  I have suggested that the husband give the patient simple activities to complete such as folding clothes. The patient lives in a 4 generation household, she is married- lives with her husband and  there are twin granddaughters  ( 38 ) and a great grandson ( 7)  in the house as well.    The patient has not had any additional seizures since November. I refilled Dilantin.   She will continue Dilantin 200 mg at bedtime. I will check blood work today. If she has any additional seizures they should let us know. She will follow-up in 4- 6  months with NP.    [facility]    11/28/2016, 3:47 PM [facility] [address], [place], [place] [address]

## 2016-11-30 ENCOUNTER — Telehealth: Payer: Self-pay | Admitting: Neurology

## 2016-11-30 MED ORDER — LORAZEPAM 0.5 MG PO TABS: ORAL_TABLET | ORAL | 0 refills | Status: DC

## 2016-11-30 NOTE — Telephone Encounter (Signed)
Rx. faxed to [facility].  Husband aware/fim

## 2016-11-30 NOTE — Telephone Encounter (Signed)
I called the patient and inform them that I made their MRI appointment at [facility] on 12/04/16 at 4:30 PM and they informed me that she is very claustrophobia and needs something sent to [facility] in [place]. The best number to contact is [phone].

## 2016-11-30 NOTE — Telephone Encounter (Signed)
Rx. awaiting RAS sig/fim 

## 2016-12-04 DIAGNOSIS — G300 Alzheimer's disease with early onset: Secondary | ICD-10-CM | POA: Diagnosis not present

## 2016-12-04 DIAGNOSIS — R51 Headache: Secondary | ICD-10-CM | POA: Diagnosis not present

## 2016-12-05 DIAGNOSIS — J449 Chronic obstructive pulmonary disease, unspecified: Secondary | ICD-10-CM | POA: Diagnosis not present

## 2016-12-14 DIAGNOSIS — M25512 Pain in left shoulder: Secondary | ICD-10-CM | POA: Diagnosis not present

## 2016-12-14 DIAGNOSIS — M7542 Impingement syndrome of left shoulder: Secondary | ICD-10-CM | POA: Diagnosis not present

## 2016-12-20 DIAGNOSIS — N390 Urinary tract infection, site not specified: Secondary | ICD-10-CM | POA: Diagnosis not present

## 2016-12-21 DIAGNOSIS — N319 Neuromuscular dysfunction of bladder, unspecified: Secondary | ICD-10-CM | POA: Diagnosis not present

## 2016-12-21 DIAGNOSIS — Z716 Tobacco abuse counseling: Secondary | ICD-10-CM | POA: Diagnosis not present

## 2016-12-21 DIAGNOSIS — F17210 Nicotine dependence, cigarettes, uncomplicated: Secondary | ICD-10-CM | POA: Diagnosis present

## 2016-12-21 DIAGNOSIS — N10 Acute tubulo-interstitial nephritis: Secondary | ICD-10-CM | POA: Diagnosis not present

## 2016-12-21 DIAGNOSIS — I129 Hypertensive chronic kidney disease with stage 1 through stage 4 chronic kidney disease, or unspecified chronic kidney disease: Secondary | ICD-10-CM | POA: Diagnosis present

## 2016-12-21 DIAGNOSIS — N12 Tubulo-interstitial nephritis, not specified as acute or chronic: Secondary | ICD-10-CM | POA: Diagnosis not present

## 2016-12-21 DIAGNOSIS — F0280 Dementia in other diseases classified elsewhere without behavioral disturbance: Secondary | ICD-10-CM | POA: Diagnosis present

## 2016-12-21 DIAGNOSIS — R339 Retention of urine, unspecified: Secondary | ICD-10-CM | POA: Diagnosis not present

## 2016-12-21 DIAGNOSIS — R079 Chest pain, unspecified: Secondary | ICD-10-CM | POA: Diagnosis not present

## 2016-12-21 DIAGNOSIS — Z720 Tobacco use: Secondary | ICD-10-CM

## 2016-12-21 DIAGNOSIS — Z8719 Personal history of other diseases of the digestive system: Secondary | ICD-10-CM | POA: Diagnosis not present

## 2016-12-21 DIAGNOSIS — E872 Acidosis: Secondary | ICD-10-CM | POA: Diagnosis not present

## 2016-12-21 DIAGNOSIS — R935 Abnormal findings on diagnostic imaging of other abdominal regions, including retroperitoneum: Secondary | ICD-10-CM | POA: Diagnosis not present

## 2016-12-21 DIAGNOSIS — Z79899 Other long term (current) drug therapy: Secondary | ICD-10-CM | POA: Diagnosis not present

## 2016-12-21 DIAGNOSIS — N179 Acute kidney failure, unspecified: Secondary | ICD-10-CM | POA: Diagnosis present

## 2016-12-21 DIAGNOSIS — G309 Alzheimer's disease, unspecified: Secondary | ICD-10-CM

## 2016-12-21 DIAGNOSIS — A419 Sepsis, unspecified organism: Secondary | ICD-10-CM | POA: Diagnosis not present

## 2016-12-21 DIAGNOSIS — N183 Chronic kidney disease, stage 3 (moderate): Secondary | ICD-10-CM | POA: Diagnosis not present

## 2016-12-21 DIAGNOSIS — R1031 Right lower quadrant pain: Secondary | ICD-10-CM | POA: Diagnosis not present

## 2016-12-21 DIAGNOSIS — Z8669 Personal history of other diseases of the nervous system and sense organs: Secondary | ICD-10-CM | POA: Diagnosis not present

## 2016-12-21 DIAGNOSIS — Z8619 Personal history of other infectious and parasitic diseases: Secondary | ICD-10-CM

## 2016-12-21 DIAGNOSIS — I10 Essential (primary) hypertension: Secondary | ICD-10-CM

## 2016-12-21 DIAGNOSIS — B952 Enterococcus as the cause of diseases classified elsewhere: Secondary | ICD-10-CM | POA: Diagnosis present

## 2016-12-21 DIAGNOSIS — J449 Chronic obstructive pulmonary disease, unspecified: Secondary | ICD-10-CM

## 2016-12-21 DIAGNOSIS — B9620 Unspecified Escherichia coli [E. coli] as the cause of diseases classified elsewhere: Secondary | ICD-10-CM | POA: Diagnosis present

## 2016-12-28 DIAGNOSIS — R51 Headache: Secondary | ICD-10-CM | POA: Diagnosis not present

## 2016-12-28 DIAGNOSIS — I10 Essential (primary) hypertension: Secondary | ICD-10-CM | POA: Diagnosis not present

## 2016-12-28 DIAGNOSIS — I160 Hypertensive urgency: Secondary | ICD-10-CM | POA: Diagnosis not present

## 2016-12-28 DIAGNOSIS — R079 Chest pain, unspecified: Secondary | ICD-10-CM | POA: Diagnosis not present

## 2017-01-09 DIAGNOSIS — R339 Retention of urine, unspecified: Secondary | ICD-10-CM | POA: Diagnosis not present

## 2017-01-09 DIAGNOSIS — N319 Neuromuscular dysfunction of bladder, unspecified: Secondary | ICD-10-CM | POA: Diagnosis not present

## 2017-01-09 DIAGNOSIS — R109 Unspecified abdominal pain: Secondary | ICD-10-CM | POA: Diagnosis not present

## 2017-01-10 DIAGNOSIS — N390 Urinary tract infection, site not specified: Secondary | ICD-10-CM | POA: Diagnosis not present

## 2017-01-16 DIAGNOSIS — I129 Hypertensive chronic kidney disease with stage 1 through stage 4 chronic kidney disease, or unspecified chronic kidney disease: Secondary | ICD-10-CM | POA: Diagnosis not present

## 2017-01-16 DIAGNOSIS — Z79891 Long term (current) use of opiate analgesic: Secondary | ICD-10-CM | POA: Diagnosis not present

## 2017-01-16 DIAGNOSIS — R7989 Other specified abnormal findings of blood chemistry: Secondary | ICD-10-CM | POA: Diagnosis present

## 2017-01-16 DIAGNOSIS — F17210 Nicotine dependence, cigarettes, uncomplicated: Secondary | ICD-10-CM | POA: Diagnosis present

## 2017-01-16 DIAGNOSIS — F0280 Dementia in other diseases classified elsewhere without behavioral disturbance: Secondary | ICD-10-CM | POA: Diagnosis present

## 2017-01-16 DIAGNOSIS — B952 Enterococcus as the cause of diseases classified elsewhere: Secondary | ICD-10-CM | POA: Diagnosis present

## 2017-01-16 DIAGNOSIS — N390 Urinary tract infection, site not specified: Secondary | ICD-10-CM | POA: Diagnosis not present

## 2017-01-16 DIAGNOSIS — N189 Chronic kidney disease, unspecified: Secondary | ICD-10-CM | POA: Diagnosis not present

## 2017-01-16 DIAGNOSIS — A419 Sepsis, unspecified organism: Secondary | ICD-10-CM | POA: Diagnosis not present

## 2017-01-16 DIAGNOSIS — N319 Neuromuscular dysfunction of bladder, unspecified: Secondary | ICD-10-CM | POA: Diagnosis present

## 2017-01-16 DIAGNOSIS — E860 Dehydration: Secondary | ICD-10-CM | POA: Diagnosis not present

## 2017-01-16 DIAGNOSIS — N183 Chronic kidney disease, stage 3 (moderate): Secondary | ICD-10-CM

## 2017-01-16 DIAGNOSIS — R531 Weakness: Secondary | ICD-10-CM | POA: Diagnosis not present

## 2017-01-16 DIAGNOSIS — E872 Acidosis: Secondary | ICD-10-CM | POA: Diagnosis not present

## 2017-01-16 DIAGNOSIS — Z8669 Personal history of other diseases of the nervous system and sense organs: Secondary | ICD-10-CM | POA: Diagnosis not present

## 2017-01-16 DIAGNOSIS — S0990XA Unspecified injury of head, initial encounter: Secondary | ICD-10-CM | POA: Diagnosis not present

## 2017-01-16 DIAGNOSIS — J449 Chronic obstructive pulmonary disease, unspecified: Secondary | ICD-10-CM | POA: Diagnosis present

## 2017-01-16 DIAGNOSIS — N3000 Acute cystitis without hematuria: Secondary | ICD-10-CM | POA: Diagnosis present

## 2017-01-16 DIAGNOSIS — G309 Alzheimer's disease, unspecified: Secondary | ICD-10-CM

## 2017-01-16 DIAGNOSIS — G92 Toxic encephalopathy: Secondary | ICD-10-CM | POA: Diagnosis not present

## 2017-01-16 DIAGNOSIS — R29810 Facial weakness: Secondary | ICD-10-CM | POA: Diagnosis present

## 2017-01-16 DIAGNOSIS — G300 Alzheimer's disease with early onset: Secondary | ICD-10-CM | POA: Diagnosis present

## 2017-01-16 DIAGNOSIS — N179 Acute kidney failure, unspecified: Secondary | ICD-10-CM | POA: Diagnosis not present

## 2017-01-16 DIAGNOSIS — Z79899 Other long term (current) drug therapy: Secondary | ICD-10-CM | POA: Diagnosis not present

## 2017-01-16 DIAGNOSIS — R4182 Altered mental status, unspecified: Secondary | ICD-10-CM | POA: Diagnosis not present

## 2017-01-16 DIAGNOSIS — B9620 Unspecified Escherichia coli [E. coli] as the cause of diseases classified elsewhere: Secondary | ICD-10-CM | POA: Diagnosis present

## 2017-01-16 DIAGNOSIS — F0390 Unspecified dementia without behavioral disturbance: Secondary | ICD-10-CM | POA: Diagnosis not present

## 2017-01-16 DIAGNOSIS — Z8673 Personal history of transient ischemic attack (TIA), and cerebral infarction without residual deficits: Secondary | ICD-10-CM | POA: Diagnosis not present

## 2017-01-16 DIAGNOSIS — A4151 Sepsis due to Escherichia coli [E. coli]: Secondary | ICD-10-CM | POA: Diagnosis present

## 2017-01-16 DIAGNOSIS — R6520 Severe sepsis without septic shock: Secondary | ICD-10-CM | POA: Diagnosis not present

## 2017-01-16 DIAGNOSIS — Z452 Encounter for adjustment and management of vascular access device: Secondary | ICD-10-CM | POA: Diagnosis not present

## 2017-01-20 DIAGNOSIS — R4182 Altered mental status, unspecified: Secondary | ICD-10-CM

## 2017-01-20 DIAGNOSIS — G309 Alzheimer's disease, unspecified: Secondary | ICD-10-CM

## 2017-01-20 DIAGNOSIS — N390 Urinary tract infection, site not specified: Secondary | ICD-10-CM

## 2017-01-20 DIAGNOSIS — G92 Toxic encephalopathy: Secondary | ICD-10-CM

## 2017-01-20 DIAGNOSIS — E860 Dehydration: Secondary | ICD-10-CM

## 2017-01-20 DIAGNOSIS — N179 Acute kidney failure, unspecified: Secondary | ICD-10-CM

## 2017-01-20 DIAGNOSIS — N183 Chronic kidney disease, stage 3 (moderate): Secondary | ICD-10-CM

## 2017-01-20 DIAGNOSIS — E872 Acidosis: Secondary | ICD-10-CM

## 2017-01-22 DIAGNOSIS — R6520 Severe sepsis without septic shock: Secondary | ICD-10-CM | POA: Diagnosis not present

## 2017-01-22 DIAGNOSIS — N319 Neuromuscular dysfunction of bladder, unspecified: Secondary | ICD-10-CM | POA: Diagnosis not present

## 2017-01-22 DIAGNOSIS — N3000 Acute cystitis without hematuria: Secondary | ICD-10-CM | POA: Diagnosis not present

## 2017-01-22 DIAGNOSIS — E872 Acidosis: Secondary | ICD-10-CM | POA: Diagnosis not present

## 2017-01-22 DIAGNOSIS — N183 Chronic kidney disease, stage 3 (moderate): Secondary | ICD-10-CM | POA: Diagnosis not present

## 2017-01-22 DIAGNOSIS — Z452 Encounter for adjustment and management of vascular access device: Secondary | ICD-10-CM | POA: Diagnosis not present

## 2017-01-22 DIAGNOSIS — A419 Sepsis, unspecified organism: Secondary | ICD-10-CM | POA: Diagnosis not present

## 2017-01-22 DIAGNOSIS — J9621 Acute and chronic respiratory failure with hypoxia: Secondary | ICD-10-CM | POA: Diagnosis not present

## 2017-01-22 DIAGNOSIS — G92 Toxic encephalopathy: Secondary | ICD-10-CM | POA: Diagnosis not present

## 2017-01-22 DIAGNOSIS — R262 Difficulty in walking, not elsewhere classified: Secondary | ICD-10-CM | POA: Diagnosis not present

## 2017-01-22 DIAGNOSIS — G300 Alzheimer's disease with early onset: Secondary | ICD-10-CM | POA: Diagnosis not present

## 2017-01-22 DIAGNOSIS — E860 Dehydration: Secondary | ICD-10-CM | POA: Diagnosis not present

## 2017-01-22 DIAGNOSIS — A4151 Sepsis due to Escherichia coli [E. coli]: Secondary | ICD-10-CM | POA: Diagnosis not present

## 2017-01-22 DIAGNOSIS — N179 Acute kidney failure, unspecified: Secondary | ICD-10-CM | POA: Diagnosis not present

## 2017-01-22 DIAGNOSIS — N184 Chronic kidney disease, stage 4 (severe): Secondary | ICD-10-CM | POA: Diagnosis not present

## 2017-01-22 DIAGNOSIS — N390 Urinary tract infection, site not specified: Secondary | ICD-10-CM | POA: Diagnosis not present

## 2017-01-23 DIAGNOSIS — R262 Difficulty in walking, not elsewhere classified: Secondary | ICD-10-CM | POA: Diagnosis not present

## 2017-01-23 DIAGNOSIS — J9621 Acute and chronic respiratory failure with hypoxia: Secondary | ICD-10-CM | POA: Diagnosis not present

## 2017-01-23 DIAGNOSIS — N184 Chronic kidney disease, stage 4 (severe): Secondary | ICD-10-CM | POA: Diagnosis not present

## 2017-01-23 DIAGNOSIS — N390 Urinary tract infection, site not specified: Secondary | ICD-10-CM | POA: Diagnosis not present

## 2017-02-08 DIAGNOSIS — Z79891 Long term (current) use of opiate analgesic: Secondary | ICD-10-CM | POA: Diagnosis not present

## 2017-02-08 DIAGNOSIS — K219 Gastro-esophageal reflux disease without esophagitis: Secondary | ICD-10-CM | POA: Diagnosis not present

## 2017-02-08 DIAGNOSIS — M810 Age-related osteoporosis without current pathological fracture: Secondary | ICD-10-CM | POA: Diagnosis not present

## 2017-02-08 DIAGNOSIS — I129 Hypertensive chronic kidney disease with stage 1 through stage 4 chronic kidney disease, or unspecified chronic kidney disease: Secondary | ICD-10-CM | POA: Diagnosis not present

## 2017-02-08 DIAGNOSIS — N319 Neuromuscular dysfunction of bladder, unspecified: Secondary | ICD-10-CM | POA: Diagnosis not present

## 2017-02-08 DIAGNOSIS — F0281 Dementia in other diseases classified elsewhere with behavioral disturbance: Secondary | ICD-10-CM | POA: Diagnosis not present

## 2017-02-08 DIAGNOSIS — F419 Anxiety disorder, unspecified: Secondary | ICD-10-CM | POA: Diagnosis not present

## 2017-02-08 DIAGNOSIS — Z8673 Personal history of transient ischemic attack (TIA), and cerebral infarction without residual deficits: Secondary | ICD-10-CM | POA: Diagnosis not present

## 2017-02-08 DIAGNOSIS — F17210 Nicotine dependence, cigarettes, uncomplicated: Secondary | ICD-10-CM | POA: Diagnosis not present

## 2017-02-08 DIAGNOSIS — Z466 Encounter for fitting and adjustment of urinary device: Secondary | ICD-10-CM | POA: Diagnosis not present

## 2017-02-08 DIAGNOSIS — N183 Chronic kidney disease, stage 3 (moderate): Secondary | ICD-10-CM | POA: Diagnosis not present

## 2017-02-08 DIAGNOSIS — G40909 Epilepsy, unspecified, not intractable, without status epilepticus: Secondary | ICD-10-CM | POA: Diagnosis not present

## 2017-02-08 DIAGNOSIS — J439 Emphysema, unspecified: Secondary | ICD-10-CM | POA: Diagnosis not present

## 2017-02-08 DIAGNOSIS — G300 Alzheimer's disease with early onset: Secondary | ICD-10-CM | POA: Diagnosis not present

## 2017-02-08 DIAGNOSIS — F329 Major depressive disorder, single episode, unspecified: Secondary | ICD-10-CM | POA: Diagnosis not present

## 2017-02-08 DIAGNOSIS — Z9181 History of falling: Secondary | ICD-10-CM | POA: Diagnosis not present

## 2017-02-11 DIAGNOSIS — G300 Alzheimer's disease with early onset: Secondary | ICD-10-CM | POA: Diagnosis not present

## 2017-02-11 DIAGNOSIS — N319 Neuromuscular dysfunction of bladder, unspecified: Secondary | ICD-10-CM | POA: Diagnosis not present

## 2017-02-11 DIAGNOSIS — F0281 Dementia in other diseases classified elsewhere with behavioral disturbance: Secondary | ICD-10-CM | POA: Diagnosis not present

## 2017-02-11 DIAGNOSIS — N183 Chronic kidney disease, stage 3 (moderate): Secondary | ICD-10-CM | POA: Diagnosis not present

## 2017-02-11 DIAGNOSIS — Z466 Encounter for fitting and adjustment of urinary device: Secondary | ICD-10-CM | POA: Diagnosis not present

## 2017-02-11 DIAGNOSIS — I129 Hypertensive chronic kidney disease with stage 1 through stage 4 chronic kidney disease, or unspecified chronic kidney disease: Secondary | ICD-10-CM | POA: Diagnosis not present

## 2017-02-13 DIAGNOSIS — N183 Chronic kidney disease, stage 3 (moderate): Secondary | ICD-10-CM | POA: Diagnosis not present

## 2017-02-13 DIAGNOSIS — F0281 Dementia in other diseases classified elsewhere with behavioral disturbance: Secondary | ICD-10-CM | POA: Diagnosis not present

## 2017-02-13 DIAGNOSIS — N319 Neuromuscular dysfunction of bladder, unspecified: Secondary | ICD-10-CM | POA: Diagnosis not present

## 2017-02-13 DIAGNOSIS — I129 Hypertensive chronic kidney disease with stage 1 through stage 4 chronic kidney disease, or unspecified chronic kidney disease: Secondary | ICD-10-CM | POA: Diagnosis not present

## 2017-02-13 DIAGNOSIS — Z466 Encounter for fitting and adjustment of urinary device: Secondary | ICD-10-CM | POA: Diagnosis not present

## 2017-02-13 DIAGNOSIS — G300 Alzheimer's disease with early onset: Secondary | ICD-10-CM | POA: Diagnosis not present

## 2017-02-15 DIAGNOSIS — G300 Alzheimer's disease with early onset: Secondary | ICD-10-CM | POA: Diagnosis not present

## 2017-02-15 DIAGNOSIS — I129 Hypertensive chronic kidney disease with stage 1 through stage 4 chronic kidney disease, or unspecified chronic kidney disease: Secondary | ICD-10-CM | POA: Diagnosis not present

## 2017-02-15 DIAGNOSIS — N183 Chronic kidney disease, stage 3 (moderate): Secondary | ICD-10-CM | POA: Diagnosis not present

## 2017-02-15 DIAGNOSIS — F0281 Dementia in other diseases classified elsewhere with behavioral disturbance: Secondary | ICD-10-CM | POA: Diagnosis not present

## 2017-02-15 DIAGNOSIS — Z466 Encounter for fitting and adjustment of urinary device: Secondary | ICD-10-CM | POA: Diagnosis not present

## 2017-02-15 DIAGNOSIS — N319 Neuromuscular dysfunction of bladder, unspecified: Secondary | ICD-10-CM | POA: Diagnosis not present

## 2017-02-19 DIAGNOSIS — Z466 Encounter for fitting and adjustment of urinary device: Secondary | ICD-10-CM | POA: Diagnosis not present

## 2017-02-19 DIAGNOSIS — F0281 Dementia in other diseases classified elsewhere with behavioral disturbance: Secondary | ICD-10-CM | POA: Diagnosis not present

## 2017-02-19 DIAGNOSIS — I129 Hypertensive chronic kidney disease with stage 1 through stage 4 chronic kidney disease, or unspecified chronic kidney disease: Secondary | ICD-10-CM | POA: Diagnosis not present

## 2017-02-19 DIAGNOSIS — G300 Alzheimer's disease with early onset: Secondary | ICD-10-CM | POA: Diagnosis not present

## 2017-02-19 DIAGNOSIS — N183 Chronic kidney disease, stage 3 (moderate): Secondary | ICD-10-CM | POA: Diagnosis not present

## 2017-02-19 DIAGNOSIS — N319 Neuromuscular dysfunction of bladder, unspecified: Secondary | ICD-10-CM | POA: Diagnosis not present

## 2017-02-21 DIAGNOSIS — F0281 Dementia in other diseases classified elsewhere with behavioral disturbance: Secondary | ICD-10-CM | POA: Diagnosis not present

## 2017-02-21 DIAGNOSIS — I129 Hypertensive chronic kidney disease with stage 1 through stage 4 chronic kidney disease, or unspecified chronic kidney disease: Secondary | ICD-10-CM | POA: Diagnosis not present

## 2017-02-21 DIAGNOSIS — Z466 Encounter for fitting and adjustment of urinary device: Secondary | ICD-10-CM | POA: Diagnosis not present

## 2017-02-21 DIAGNOSIS — N319 Neuromuscular dysfunction of bladder, unspecified: Secondary | ICD-10-CM | POA: Diagnosis not present

## 2017-02-21 DIAGNOSIS — G300 Alzheimer's disease with early onset: Secondary | ICD-10-CM | POA: Diagnosis not present

## 2017-02-21 DIAGNOSIS — N183 Chronic kidney disease, stage 3 (moderate): Secondary | ICD-10-CM | POA: Diagnosis not present

## 2017-02-22 DIAGNOSIS — T83518A Infection and inflammatory reaction due to other urinary catheter, initial encounter: Secondary | ICD-10-CM | POA: Diagnosis not present

## 2017-02-22 DIAGNOSIS — K7460 Unspecified cirrhosis of liver: Secondary | ICD-10-CM | POA: Diagnosis not present

## 2017-02-22 DIAGNOSIS — G4700 Insomnia, unspecified: Secondary | ICD-10-CM | POA: Diagnosis not present

## 2017-02-22 DIAGNOSIS — J449 Chronic obstructive pulmonary disease, unspecified: Secondary | ICD-10-CM | POA: Diagnosis not present

## 2017-02-22 DIAGNOSIS — Z681 Body mass index (BMI) 19 or less, adult: Secondary | ICD-10-CM | POA: Diagnosis not present

## 2017-02-25 DIAGNOSIS — N390 Urinary tract infection, site not specified: Secondary | ICD-10-CM | POA: Diagnosis not present

## 2017-02-25 DIAGNOSIS — R339 Retention of urine, unspecified: Secondary | ICD-10-CM | POA: Diagnosis not present

## 2017-02-26 DIAGNOSIS — I129 Hypertensive chronic kidney disease with stage 1 through stage 4 chronic kidney disease, or unspecified chronic kidney disease: Secondary | ICD-10-CM | POA: Diagnosis not present

## 2017-02-26 DIAGNOSIS — N319 Neuromuscular dysfunction of bladder, unspecified: Secondary | ICD-10-CM | POA: Diagnosis not present

## 2017-02-26 DIAGNOSIS — G300 Alzheimer's disease with early onset: Secondary | ICD-10-CM | POA: Diagnosis not present

## 2017-02-26 DIAGNOSIS — N183 Chronic kidney disease, stage 3 (moderate): Secondary | ICD-10-CM | POA: Diagnosis not present

## 2017-02-26 DIAGNOSIS — F0281 Dementia in other diseases classified elsewhere with behavioral disturbance: Secondary | ICD-10-CM | POA: Diagnosis not present

## 2017-02-26 DIAGNOSIS — Z466 Encounter for fitting and adjustment of urinary device: Secondary | ICD-10-CM | POA: Diagnosis not present

## 2017-03-05 DIAGNOSIS — N2581 Secondary hyperparathyroidism of renal origin: Secondary | ICD-10-CM | POA: Diagnosis not present

## 2017-03-05 DIAGNOSIS — D631 Anemia in chronic kidney disease: Secondary | ICD-10-CM | POA: Diagnosis not present

## 2017-03-05 DIAGNOSIS — I10 Essential (primary) hypertension: Secondary | ICD-10-CM | POA: Diagnosis not present

## 2017-03-05 DIAGNOSIS — Z466 Encounter for fitting and adjustment of urinary device: Secondary | ICD-10-CM | POA: Diagnosis not present

## 2017-03-05 DIAGNOSIS — Z720 Tobacco use: Secondary | ICD-10-CM | POA: Diagnosis not present

## 2017-03-05 DIAGNOSIS — N319 Neuromuscular dysfunction of bladder, unspecified: Secondary | ICD-10-CM | POA: Diagnosis not present

## 2017-03-05 DIAGNOSIS — N183 Chronic kidney disease, stage 3 (moderate): Secondary | ICD-10-CM | POA: Diagnosis not present

## 2017-03-05 DIAGNOSIS — G300 Alzheimer's disease with early onset: Secondary | ICD-10-CM | POA: Diagnosis not present

## 2017-03-05 DIAGNOSIS — I129 Hypertensive chronic kidney disease with stage 1 through stage 4 chronic kidney disease, or unspecified chronic kidney disease: Secondary | ICD-10-CM | POA: Diagnosis not present

## 2017-03-05 DIAGNOSIS — F0281 Dementia in other diseases classified elsewhere with behavioral disturbance: Secondary | ICD-10-CM | POA: Diagnosis not present

## 2017-03-12 DIAGNOSIS — I129 Hypertensive chronic kidney disease with stage 1 through stage 4 chronic kidney disease, or unspecified chronic kidney disease: Secondary | ICD-10-CM | POA: Diagnosis not present

## 2017-03-12 DIAGNOSIS — F0281 Dementia in other diseases classified elsewhere with behavioral disturbance: Secondary | ICD-10-CM | POA: Diagnosis not present

## 2017-03-12 DIAGNOSIS — N319 Neuromuscular dysfunction of bladder, unspecified: Secondary | ICD-10-CM | POA: Diagnosis not present

## 2017-03-12 DIAGNOSIS — G300 Alzheimer's disease with early onset: Secondary | ICD-10-CM | POA: Diagnosis not present

## 2017-03-12 DIAGNOSIS — N183 Chronic kidney disease, stage 3 (moderate): Secondary | ICD-10-CM | POA: Diagnosis not present

## 2017-03-12 DIAGNOSIS — Z466 Encounter for fitting and adjustment of urinary device: Secondary | ICD-10-CM | POA: Diagnosis not present

## 2017-03-22 DIAGNOSIS — G300 Alzheimer's disease with early onset: Secondary | ICD-10-CM | POA: Diagnosis not present

## 2017-03-22 DIAGNOSIS — Z466 Encounter for fitting and adjustment of urinary device: Secondary | ICD-10-CM | POA: Diagnosis not present

## 2017-03-22 DIAGNOSIS — I129 Hypertensive chronic kidney disease with stage 1 through stage 4 chronic kidney disease, or unspecified chronic kidney disease: Secondary | ICD-10-CM | POA: Diagnosis not present

## 2017-03-22 DIAGNOSIS — F0281 Dementia in other diseases classified elsewhere with behavioral disturbance: Secondary | ICD-10-CM | POA: Diagnosis not present

## 2017-03-22 DIAGNOSIS — N319 Neuromuscular dysfunction of bladder, unspecified: Secondary | ICD-10-CM | POA: Diagnosis not present

## 2017-03-22 DIAGNOSIS — N183 Chronic kidney disease, stage 3 (moderate): Secondary | ICD-10-CM | POA: Diagnosis not present

## 2017-03-23 DIAGNOSIS — M545 Low back pain: Secondary | ICD-10-CM | POA: Diagnosis not present

## 2017-03-23 DIAGNOSIS — R1030 Lower abdominal pain, unspecified: Secondary | ICD-10-CM | POA: Diagnosis not present

## 2017-03-28 ENCOUNTER — Other Ambulatory Visit: Payer: Self-pay | Admitting: Adult Health

## 2017-04-03 DIAGNOSIS — N390 Urinary tract infection, site not specified: Secondary | ICD-10-CM | POA: Diagnosis not present

## 2017-04-03 DIAGNOSIS — R339 Retention of urine, unspecified: Secondary | ICD-10-CM | POA: Diagnosis not present

## 2017-04-03 DIAGNOSIS — N1330 Unspecified hydronephrosis: Secondary | ICD-10-CM | POA: Diagnosis not present

## 2017-04-08 DIAGNOSIS — J449 Chronic obstructive pulmonary disease, unspecified: Secondary | ICD-10-CM | POA: Diagnosis not present

## 2017-04-08 DIAGNOSIS — R0989 Other specified symptoms and signs involving the circulatory and respiratory systems: Secondary | ICD-10-CM | POA: Diagnosis not present

## 2017-04-08 DIAGNOSIS — I10 Essential (primary) hypertension: Secondary | ICD-10-CM | POA: Diagnosis not present

## 2017-04-08 DIAGNOSIS — N183 Chronic kidney disease, stage 3 (moderate): Secondary | ICD-10-CM | POA: Diagnosis not present

## 2017-04-11 DIAGNOSIS — Z79899 Other long term (current) drug therapy: Secondary | ICD-10-CM | POA: Diagnosis not present

## 2017-04-11 DIAGNOSIS — N131 Hydronephrosis with ureteral stricture, not elsewhere classified: Secondary | ICD-10-CM | POA: Diagnosis not present

## 2017-04-11 DIAGNOSIS — E7800 Pure hypercholesterolemia, unspecified: Secondary | ICD-10-CM | POA: Diagnosis not present

## 2017-04-11 DIAGNOSIS — N135 Crossing vessel and stricture of ureter without hydronephrosis: Secondary | ICD-10-CM | POA: Diagnosis not present

## 2017-04-11 DIAGNOSIS — F17210 Nicotine dependence, cigarettes, uncomplicated: Secondary | ICD-10-CM | POA: Diagnosis not present

## 2017-05-08 DIAGNOSIS — N390 Urinary tract infection, site not specified: Secondary | ICD-10-CM | POA: Diagnosis not present

## 2017-05-13 ENCOUNTER — Telehealth: Payer: Self-pay | Admitting: Adult Health

## 2017-05-13 NOTE — Telephone Encounter (Signed)
Pt's granddaughter called said her grandparents have separated and the pt is living with her now. She said the pt is depressed and she is wanting to get some medication to help with it. She also said she has been giving the medication as directed on the RX bottles. Please call to clarify directions on the meds and if Jinny Blossom wants to prescribe depression med or if she should call PCP

## 2017-05-14 NOTE — Telephone Encounter (Signed)
Spoke to Landscape architect, she has appt with pcp tomorrow for depression.  I will touch base with her tomorrow after 1500 to go over dosing her medications. Amber verbalized understanding.

## 2017-05-15 NOTE — Telephone Encounter (Signed)
Called an LMVM for pt [name], that was calling back about going over medications that we prescribe for [name].  (per her request as pt is now living with her).

## 2017-05-16 DIAGNOSIS — F418 Other specified anxiety disorders: Secondary | ICD-10-CM | POA: Diagnosis not present

## 2017-05-16 DIAGNOSIS — R0602 Shortness of breath: Secondary | ICD-10-CM | POA: Diagnosis not present

## 2017-05-16 DIAGNOSIS — Z139 Encounter for screening, unspecified: Secondary | ICD-10-CM | POA: Diagnosis not present

## 2017-05-16 DIAGNOSIS — R51 Headache: Secondary | ICD-10-CM | POA: Diagnosis not present

## 2017-05-16 DIAGNOSIS — F17200 Nicotine dependence, unspecified, uncomplicated: Secondary | ICD-10-CM | POA: Diagnosis not present

## 2017-05-16 DIAGNOSIS — Z1389 Encounter for screening for other disorder: Secondary | ICD-10-CM | POA: Diagnosis not present

## 2017-05-16 DIAGNOSIS — Z681 Body mass index (BMI) 19 or less, adult: Secondary | ICD-10-CM | POA: Diagnosis not present

## 2017-05-16 DIAGNOSIS — F419 Anxiety disorder, unspecified: Secondary | ICD-10-CM | POA: Diagnosis not present

## 2017-05-16 DIAGNOSIS — F339 Major depressive disorder, recurrent, unspecified: Secondary | ICD-10-CM | POA: Diagnosis not present

## 2017-05-16 DIAGNOSIS — F3160 Bipolar disorder, current episode mixed, unspecified: Secondary | ICD-10-CM | POA: Diagnosis not present

## 2017-05-23 NOTE — Telephone Encounter (Signed)
I called and spoke to [employer],  I will mail copy of med list (containing the meds we prescribed with dose) to address listed.  Amber, grand daughter verbalized understanding.

## 2017-05-29 ENCOUNTER — Ambulatory Visit: Payer: Medicare Other | Admitting: Adult Health

## 2017-06-04 ENCOUNTER — Encounter: Payer: Self-pay | Admitting: Adult Health

## 2017-06-04 ENCOUNTER — Ambulatory Visit (INDEPENDENT_AMBULATORY_CARE_PROVIDER_SITE_OTHER): Payer: Medicare Other | Admitting: Adult Health

## 2017-06-04 VITALS — BP 138/66 | HR 56 | Ht 61.0 in | Wt 84.0 lb

## 2017-06-04 DIAGNOSIS — R569 Unspecified convulsions: Secondary | ICD-10-CM

## 2017-06-04 DIAGNOSIS — Z5181 Encounter for therapeutic drug level monitoring: Secondary | ICD-10-CM | POA: Diagnosis not present

## 2017-06-04 DIAGNOSIS — F0390 Unspecified dementia without behavioral disturbance: Secondary | ICD-10-CM | POA: Diagnosis not present

## 2017-06-04 NOTE — Progress Notes (Signed)
PATIENT: [name] DOB: Dec 22, 1950  REASON FOR VISIT: follow up- Dementia, insomnia and seizures HISTORY FROM: patient and grand daughter  HISTORY OF PRESENT ILLNESS: June 04 2017 Ms. [name] is a 66 year old female with a history of dementia, insomnia and seizures. She returns today for follow-up. She reports that she feels that her memory may be slightly worse. She is currently living with her granddaughter. She is able to complete all ADLs independently. She does require some assistance getting in and out of the bathtub. She does not operate a motor vehicle. She no longer handles her own finances or does any cooking. She remains on Aricept and Namenda and is tolerating those medications well. She is on Dilantin 200 mg at bedtime. She denies any seizure events. She has had ongoing trouble with her gait and balance. She uses a cane when ambulating. Granddaughter reports that she had a fall one month ago. She suffered an abrasion over the left eye. The patient admits that she does not always use her cane. She does have an indwelling catheter. She returns today for an evaluation.  HISTORY 11/28/16: Ms. [name] is a 66 year old female with a history of dementia, insomnia and seizures. She returns today for follow-up. The patient was last seen in November 2014 by Dr. Roddie Mc. Her husband is with her today and reports that her memory has gotten worse. He states that she forgets where she puts things. She is able to complete ADLs independently however she does require supervision. The patient no longer does household chores. Husband states that she simply has forgotten how to do those things. She does not operate a motor vehicle. The husband does state that she is sleepy throughout the day. He states that most days she will sleep most the day and then stay up at night. He states that she has wondered during the night and therefore he has to stay up as well to provide supervision. She is on Ambien 5 mg and he  states that works for about 2 hours and the patient is back up. He states that the patient does not complete many activities during the day. She does take oxycodone and tramadol for pain. The patient has an artificial bladder with a catheter that often gets infected and causes pain. The patient also has a history of seizures. He states that most recently she had 4 seizures in November in a span of two days. She was hospitalized at that time. He states that her seizures consist of her eyes rolling back in her head in her hands shake. Since then the patient has not had any seizure events. She is currently taking Dilantin 200 mg at bedtime. The patient returns today for an evaluation.   REVIEW OF SYSTEMS: Out of a complete 14 system review of symptoms, the patient complains only of the following symptoms, and all other reviewed systems are negative.  Appetite change, chills, fatigue, shortness of breath, chest tightness, chest swelling, abdominal pain, constipation, daytime sleepiness, sleep talking, back pain, aching muscles, muscle cramps, neck pain, behavior problem, confusion, depression, speech difficulty, weakness, memory loss, dizziness, headache, difficulty urinating  ALLERGIES: Allergies  Allergen Reactions  . Codeine Nausea And Vomiting    REACTION: intolerance  . Latex     itching    HOME MEDICATIONS: Outpatient Medications Prior to Visit  Medication Sig Dispense Refill  . atorvastatin (LIPITOR) 10 MG tablet Take 10 mg by mouth daily.     . carvedilol (COREG) 12.5 MG tablet  10  . citalopram (CELEXA) 40 MG tablet Take 1 tablet (40 mg total) by mouth daily. 90 tablet 1  . clopidogrel (PLAVIX) 75 MG tablet Take 75 mg by mouth daily.    Marland Kitchen donepezil (ARICEPT) 10 MG tablet TAKE 1 BY MOUTH AT BEDTIME 90 tablet 1  . LORazepam (ATIVAN) 0.5 MG tablet Take one tablet 30 min. prior to MRI.  Take 2nd tablet to MRI appt. and repeat if needed.  You must not drink alcohol, drive, or operate  dangerous equipment when taking this medication. 2 tablet 0  . memantine (NAMENDA) 10 MG tablet Take 1 tablet (10 mg total) by mouth 2 (two) times daily. 180 tablet 1  . phenytoin (DILANTIN) 100 MG ER capsule Take 2 capsules (200 mg total) by mouth at bedtime. 180 capsule 3   Facility-Administered Medications Prior to Visit  Medication Dose Route Frequency Provider Last Rate Last Dose  . doxylamine (Sleep) (UNISOM) tablet 25 mg  25 mg Oral QHS PRN Dohmeier, Asencion Partridge, MD        PAST MEDICAL HISTORY: Past Medical History:  Diagnosis Date  . Alzheimer's dementia    takes Aricept nightly  . Anemia   . COPD (chronic obstructive pulmonary disease) ([place])   . History of blood transfusion    received 2units on Feb 17,2015  . History of MRSA infection    several yrs ago  . Hyperlipidemia    takes Atorvastatin daily  . Hypertension    takes Amlodipine and Carvedilol daily  . Insomnia    takes Ambien nightly  . Renal disorder    kidney stones  . Subacute delirium 10/14/2013   takes Seroquel and Dilantin nightly  . Toxic encephalopathy   . UTI (lower urinary tract infection)    takes Macrodantin daily    PAST SURGICAL HISTORY: Past Surgical History:  Procedure Laterality Date  . ABDOMINAL ANGIOGRAM N/A 01/20/2014   Procedure: ABDOMINAL ANGIOGRAM;  Surgeon: Elam Dutch, MD;  Location: [facility] CATH LAB;  Service: Cardiovascular;  Laterality: N/A;  . ABDOMINAL HYSTERECTOMY    . AMPUTATION Left 02/08/2014   Procedure: AMPUTATION Left 4th & 5th toes;  Surgeon: Conrad Eagle, MD;  Location: [facility];  Service: Vascular;  Laterality: Left;  . BLADDER SURGERY    . CHOLECYSTECTOMY  1995   Gall Bladder  . INSERTION OF ILIAC STENT Left 01/26/2014   Procedure: INSERTION OF ILIAC STENT- LEFT COMMON ILIAC ARTERY STENT ;  Surgeon: Conrad Matfield Green, MD;  Location: [facility];  Service: Vascular;  Laterality: Left;  . KNEE SURGERY      FAMILY HISTORY: Family History  Problem Relation Age of Onset  . Cancer  Brother     SOCIAL HISTORY: Social History   Social History  . Marital status: Married    Spouse name: N/A  . Number of children: N/A  . Years of education: N/A   Occupational History  . Not on file.   Social History Main Topics  . Smoking status: Current Every Day Smoker    Packs/day: 0.75    Types: Cigarettes  . Smokeless tobacco: Never Used  . Alcohol use No  . Drug use: No  . Sexual activity: Not on file   Other Topics Concern  . Not on file   Social History Narrative   Living with husband at home.        PHYSICAL EXAM  Vitals:   06/04/17 0758  BP: 138/66  Pulse: (!) 56  Weight: 84 lb (38.1 kg)  Height: 5\' 1"  (1.549 m)   Body mass index is 15.87 kg/m.   MMSE - Mini Mental State Exam 06/04/2017 11/28/2016 04/02/2016  Orientation to time 3 3 1   Orientation to Place 3 2 4   Registration 3 3 3   Attention/ Calculation 0 0 0  Recall 1 0 1  Language- name 2 objects 2 2 2   Language- repeat 1 1 1   Language- follow 3 step command 3 3 2   Language- read & follow direction 1 1 1   Write a sentence 1 1 1   Copy design 1 1 1   Total score 19 17 17      Generalized: Well developed, in no acute distress   Neurological examination  Mentation: Alert. Follows all commands speech and language fluent Cranial nerve II-XII: Pupils were equal round reactive to light. Extraocular movements were full, visual field were full on confrontational test. Facial sensation and strength were normal. Uvula tongue midline. Head turning and shoulder shrug  were normal and symmetric. Motor: The motor testing reveals 5 over 5 strength of all 4 extremities. Good symmetric motor tone is noted throughout.  Sensory: Sensory testing is intact to soft touch on all 4 extremities. No evidence of extinction is noted.  Coordination: Cerebellar testing reveals good finger-nose-finger and heel-to-shin bilaterally.  Gait and station: Gait is wide-based. Tandem gait not attempted. Patient does not have  her cane with her today. Reflexes: Deep tendon reflexes are symmetric and normal bilaterally.   DIAGNOSTIC DATA (LABS, IMAGING, TESTING) - I reviewed patient records, labs, notes, testing and imaging myself where available.  Lab Results  Component Value Date   WBC 8.5 04/02/2016   HGB 11.0 (L) 04/02/2016   HCT 34.7 04/02/2016   MCV 91 04/02/2016   PLT 166 04/02/2016      Component Value Date/Time   NA 144 04/02/2016 0956   K 4.9 04/02/2016 0956   CL 109 (H) 04/02/2016 0956   CO2 19 04/02/2016 0956   GLUCOSE 58 (L) 04/02/2016 0956   GLUCOSE 96 01/27/2014 0415   BUN 20 04/02/2016 0956   CREATININE 1.29 (H) 04/02/2016 0956   CALCIUM 8.8 04/02/2016 0956   PROT 6.6 04/02/2016 0956   ALBUMIN 3.9 04/02/2016 0956   AST 12 04/02/2016 0956   ALT 7 04/02/2016 0956   ALKPHOS 164 (H) 04/02/2016 0956   BILITOT <0.2 04/02/2016 0956   GFRNONAA 44 (L) 04/02/2016 0956   GFRAA 51 (L) 04/02/2016 0956         ASSESSMENT AND PLAN 66 y.o. year old female  has a past medical history of Alzheimer's dementia; Anemia; COPD (chronic obstructive pulmonary disease) ([place]); History of blood transfusion; History of MRSA infection; Hyperlipidemia; Hypertension; Insomnia; Renal disorder; Subacute delirium (10/14/2013); Toxic encephalopathy; and UTI (lower urinary tract infection). here with:  1. Dementia 2. Seizures  The patient's memory score has remained stable. She will continue on Aricept and Namenda. She reports no additional seizure events. She will remain on Dilantin 200 mg at bedtime. I will check blood work today. She is advised that if her symptoms worsen or she develops new symptoms she should let us know. She will follow-up in 6 months or sooner if needed.     Ward Givens, MSN, NP-C 06/04/2017, 8:13 AM [facility] [address], [place], [place] [address] [phone]

## 2017-06-04 NOTE — Patient Instructions (Signed)
Continue Aricept and Namenda- memory score is stable Continue Dilantin- blood work today If your symptoms worsen or you develop new symptoms please let us know.

## 2017-06-05 LAB — CBC WITH DIFFERENTIAL/PLATELET
Basophils Absolute: 0 10*3/uL (ref 0.0–0.2)
Basos: 0 %
EOS (ABSOLUTE): 0.1 10*3/uL (ref 0.0–0.4)
EOS: 2 %
HEMATOCRIT: 33.4 % — AB (ref 34.0–46.6)
Hemoglobin: 10.7 g/dL — ABNORMAL LOW (ref 11.1–15.9)
IMMATURE GRANS (ABS): 0 10*3/uL (ref 0.0–0.1)
Immature Granulocytes: 0 %
LYMPHS ABS: 2.4 10*3/uL (ref 0.7–3.1)
LYMPHS: 31 %
MCH: 31.3 pg (ref 26.6–33.0)
MCHC: 32 g/dL (ref 31.5–35.7)
MCV: 98 fL — ABNORMAL HIGH (ref 79–97)
MONOCYTES: 7 %
Monocytes Absolute: 0.5 10*3/uL (ref 0.1–0.9)
NEUTROS ABS: 4.7 10*3/uL (ref 1.4–7.0)
Neutrophils: 60 %
Platelets: 154 10*3/uL (ref 150–379)
RBC: 3.42 x10E6/uL — AB (ref 3.77–5.28)
RDW: 16.6 % — ABNORMAL HIGH (ref 12.3–15.4)
WBC: 7.8 10*3/uL (ref 3.4–10.8)

## 2017-06-05 LAB — COMPREHENSIVE METABOLIC PANEL
ALBUMIN: 3.9 g/dL (ref 3.6–4.8)
ALT: 8 IU/L (ref 0–32)
AST: 7 IU/L (ref 0–40)
Albumin/Globulin Ratio: 1.5 (ref 1.2–2.2)
Alkaline Phosphatase: 111 IU/L (ref 39–117)
BUN / CREAT RATIO: 12 (ref 12–28)
BUN: 20 mg/dL (ref 8–27)
Bilirubin Total: <0.2 mg/dL (ref 0.0–1.2)
CO2: 18 mmol/L — AB (ref 20–29)
CREATININE: 1.73 mg/dL — AB (ref 0.57–1.00)
Calcium: 8.7 mg/dL (ref 8.7–10.3)
Chloride: 109 mmol/L — ABNORMAL HIGH (ref 96–106)
GFR calc Af Amer: 35 mL/min/{1.73_m2} — ABNORMAL LOW (ref >59)
GFR calc non Af Amer: 31 mL/min/{1.73_m2} — ABNORMAL LOW (ref >59)
GLUCOSE: 61 mg/dL — AB (ref 65–99)
Globulin, Total: 2.6 g/dL (ref 1.5–4.5)
Potassium: 5.2 mmol/L (ref 3.5–5.2)
Sodium: 141 mmol/L (ref 134–144)
TOTAL PROTEIN: 6.5 g/dL (ref 6.0–8.5)

## 2017-06-05 LAB — PHENYTOIN LEVEL, TOTAL: Phenytoin (Dilantin), Serum: 16.7 ug/mL (ref 10.0–20.0)

## 2017-06-05 NOTE — Progress Notes (Signed)
I agree with the assessment and plan as directed by NP .The patient is known to me .   Dijon Cosens, MD  

## 2017-06-07 ENCOUNTER — Telehealth: Payer: Self-pay | Admitting: *Deleted

## 2017-06-07 NOTE — Telephone Encounter (Signed)
Attempted to reach patient on home number; no longer in service. Home number listed for [name], emergency contact is no longer in service. Called mobile number for emergency contact and LVM requesting call back for lab results for patient. Advised our office closes at noon today, reopens 8 am Monday.

## 2017-06-10 ENCOUNTER — Telehealth: Payer: Self-pay | Admitting: *Deleted

## 2017-06-10 DIAGNOSIS — N135 Crossing vessel and stricture of ureter without hydronephrosis: Secondary | ICD-10-CM | POA: Diagnosis not present

## 2017-06-10 DIAGNOSIS — N390 Urinary tract infection, site not specified: Secondary | ICD-10-CM | POA: Diagnosis not present

## 2017-06-10 DIAGNOSIS — N319 Neuromuscular dysfunction of bladder, unspecified: Secondary | ICD-10-CM | POA: Diagnosis not present

## 2017-06-10 DIAGNOSIS — R339 Retention of urine, unspecified: Secondary | ICD-10-CM | POA: Diagnosis not present

## 2017-06-10 NOTE — Telephone Encounter (Addendum)
Attempted to reach patient's husband again. LVM requesting a call back for lab results.

## 2017-06-10 NOTE — Telephone Encounter (Signed)
error 

## 2017-06-18 DIAGNOSIS — N2581 Secondary hyperparathyroidism of renal origin: Secondary | ICD-10-CM | POA: Diagnosis not present

## 2017-06-18 DIAGNOSIS — D631 Anemia in chronic kidney disease: Secondary | ICD-10-CM | POA: Diagnosis not present

## 2017-06-18 DIAGNOSIS — N183 Chronic kidney disease, stage 3 (moderate): Secondary | ICD-10-CM | POA: Diagnosis not present

## 2017-06-19 ENCOUNTER — Encounter: Payer: Self-pay | Admitting: *Deleted

## 2017-06-19 NOTE — Telephone Encounter (Signed)
Letter re" lab results mailed today.

## 2017-06-27 DIAGNOSIS — B353 Tinea pedis: Secondary | ICD-10-CM | POA: Diagnosis not present

## 2017-06-27 DIAGNOSIS — F418 Other specified anxiety disorders: Secondary | ICD-10-CM | POA: Diagnosis not present

## 2017-06-27 DIAGNOSIS — Z681 Body mass index (BMI) 19 or less, adult: Secondary | ICD-10-CM | POA: Diagnosis not present

## 2017-06-29 DIAGNOSIS — F17200 Nicotine dependence, unspecified, uncomplicated: Secondary | ICD-10-CM | POA: Diagnosis not present

## 2017-06-29 DIAGNOSIS — J441 Chronic obstructive pulmonary disease with (acute) exacerbation: Secondary | ICD-10-CM | POA: Diagnosis not present

## 2017-06-29 DIAGNOSIS — I10 Essential (primary) hypertension: Secondary | ICD-10-CM | POA: Diagnosis not present

## 2017-06-29 DIAGNOSIS — R0602 Shortness of breath: Secondary | ICD-10-CM | POA: Diagnosis not present

## 2017-06-29 DIAGNOSIS — N3000 Acute cystitis without hematuria: Secondary | ICD-10-CM | POA: Diagnosis not present

## 2017-06-29 DIAGNOSIS — J449 Chronic obstructive pulmonary disease, unspecified: Secondary | ICD-10-CM | POA: Diagnosis not present

## 2017-07-11 DIAGNOSIS — N390 Urinary tract infection, site not specified: Secondary | ICD-10-CM | POA: Diagnosis not present

## 2017-07-11 DIAGNOSIS — N135 Crossing vessel and stricture of ureter without hydronephrosis: Secondary | ICD-10-CM | POA: Diagnosis not present

## 2017-07-11 DIAGNOSIS — N319 Neuromuscular dysfunction of bladder, unspecified: Secondary | ICD-10-CM | POA: Diagnosis not present

## 2017-07-11 DIAGNOSIS — R339 Retention of urine, unspecified: Secondary | ICD-10-CM | POA: Diagnosis not present

## 2017-07-23 DIAGNOSIS — N2581 Secondary hyperparathyroidism of renal origin: Secondary | ICD-10-CM | POA: Diagnosis not present

## 2017-07-23 DIAGNOSIS — D631 Anemia in chronic kidney disease: Secondary | ICD-10-CM | POA: Diagnosis not present

## 2017-07-23 DIAGNOSIS — N183 Chronic kidney disease, stage 3 (moderate): Secondary | ICD-10-CM | POA: Diagnosis not present

## 2017-07-26 DIAGNOSIS — N390 Urinary tract infection, site not specified: Secondary | ICD-10-CM | POA: Diagnosis not present

## 2017-08-13 DIAGNOSIS — Z23 Encounter for immunization: Secondary | ICD-10-CM | POA: Diagnosis not present

## 2017-08-13 DIAGNOSIS — Z7189 Other specified counseling: Secondary | ICD-10-CM | POA: Diagnosis not present

## 2017-08-13 DIAGNOSIS — Z681 Body mass index (BMI) 19 or less, adult: Secondary | ICD-10-CM | POA: Diagnosis not present

## 2017-08-13 DIAGNOSIS — G4700 Insomnia, unspecified: Secondary | ICD-10-CM | POA: Diagnosis not present

## 2017-08-13 DIAGNOSIS — F418 Other specified anxiety disorders: Secondary | ICD-10-CM | POA: Diagnosis not present

## 2017-08-14 DIAGNOSIS — J301 Allergic rhinitis due to pollen: Secondary | ICD-10-CM | POA: Diagnosis not present

## 2017-08-14 DIAGNOSIS — J3089 Other allergic rhinitis: Secondary | ICD-10-CM | POA: Diagnosis not present

## 2017-08-15 DIAGNOSIS — R339 Retention of urine, unspecified: Secondary | ICD-10-CM | POA: Diagnosis not present

## 2017-08-15 DIAGNOSIS — N135 Crossing vessel and stricture of ureter without hydronephrosis: Secondary | ICD-10-CM | POA: Diagnosis not present

## 2017-08-15 DIAGNOSIS — J301 Allergic rhinitis due to pollen: Secondary | ICD-10-CM | POA: Diagnosis not present

## 2017-08-15 DIAGNOSIS — N390 Urinary tract infection, site not specified: Secondary | ICD-10-CM | POA: Diagnosis not present

## 2017-08-15 DIAGNOSIS — J3089 Other allergic rhinitis: Secondary | ICD-10-CM | POA: Diagnosis not present

## 2017-08-16 DIAGNOSIS — J301 Allergic rhinitis due to pollen: Secondary | ICD-10-CM | POA: Diagnosis not present

## 2017-08-16 DIAGNOSIS — J3089 Other allergic rhinitis: Secondary | ICD-10-CM | POA: Diagnosis not present

## 2017-08-19 DIAGNOSIS — J3089 Other allergic rhinitis: Secondary | ICD-10-CM | POA: Diagnosis not present

## 2017-08-19 DIAGNOSIS — N183 Chronic kidney disease, stage 3 (moderate): Secondary | ICD-10-CM | POA: Diagnosis not present

## 2017-08-19 DIAGNOSIS — J301 Allergic rhinitis due to pollen: Secondary | ICD-10-CM | POA: Diagnosis not present

## 2017-08-19 DIAGNOSIS — D631 Anemia in chronic kidney disease: Secondary | ICD-10-CM | POA: Diagnosis not present

## 2017-08-19 DIAGNOSIS — I10 Essential (primary) hypertension: Secondary | ICD-10-CM | POA: Diagnosis not present

## 2017-08-19 DIAGNOSIS — N2581 Secondary hyperparathyroidism of renal origin: Secondary | ICD-10-CM | POA: Diagnosis not present

## 2017-08-20 DIAGNOSIS — J301 Allergic rhinitis due to pollen: Secondary | ICD-10-CM | POA: Diagnosis not present

## 2017-08-20 DIAGNOSIS — J3089 Other allergic rhinitis: Secondary | ICD-10-CM | POA: Diagnosis not present

## 2017-08-21 DIAGNOSIS — J3089 Other allergic rhinitis: Secondary | ICD-10-CM | POA: Diagnosis not present

## 2017-08-21 DIAGNOSIS — J301 Allergic rhinitis due to pollen: Secondary | ICD-10-CM | POA: Diagnosis not present

## 2017-08-22 DIAGNOSIS — J3089 Other allergic rhinitis: Secondary | ICD-10-CM | POA: Diagnosis not present

## 2017-08-22 DIAGNOSIS — J301 Allergic rhinitis due to pollen: Secondary | ICD-10-CM | POA: Diagnosis not present

## 2017-09-03 DIAGNOSIS — E7800 Pure hypercholesterolemia, unspecified: Secondary | ICD-10-CM | POA: Diagnosis not present

## 2017-09-03 DIAGNOSIS — N135 Crossing vessel and stricture of ureter without hydronephrosis: Secondary | ICD-10-CM | POA: Diagnosis not present

## 2017-09-03 DIAGNOSIS — Z8669 Personal history of other diseases of the nervous system and sense organs: Secondary | ICD-10-CM | POA: Diagnosis not present

## 2017-09-03 DIAGNOSIS — Z86718 Personal history of other venous thrombosis and embolism: Secondary | ICD-10-CM | POA: Diagnosis not present

## 2017-09-03 DIAGNOSIS — F17210 Nicotine dependence, cigarettes, uncomplicated: Secondary | ICD-10-CM | POA: Diagnosis not present

## 2017-09-03 DIAGNOSIS — N183 Chronic kidney disease, stage 3 (moderate): Secondary | ICD-10-CM | POA: Diagnosis not present

## 2017-09-03 DIAGNOSIS — Z8673 Personal history of transient ischemic attack (TIA), and cerebral infarction without residual deficits: Secondary | ICD-10-CM | POA: Diagnosis not present

## 2017-09-03 DIAGNOSIS — Z79899 Other long term (current) drug therapy: Secondary | ICD-10-CM | POA: Diagnosis not present

## 2017-09-03 DIAGNOSIS — J449 Chronic obstructive pulmonary disease, unspecified: Secondary | ICD-10-CM | POA: Diagnosis not present

## 2017-09-03 DIAGNOSIS — N131 Hydronephrosis with ureteral stricture, not elsewhere classified: Secondary | ICD-10-CM | POA: Diagnosis not present

## 2017-09-03 DIAGNOSIS — I129 Hypertensive chronic kidney disease with stage 1 through stage 4 chronic kidney disease, or unspecified chronic kidney disease: Secondary | ICD-10-CM | POA: Diagnosis not present

## 2017-09-03 DIAGNOSIS — Z7902 Long term (current) use of antithrombotics/antiplatelets: Secondary | ICD-10-CM | POA: Diagnosis not present

## 2017-09-03 DIAGNOSIS — E785 Hyperlipidemia, unspecified: Secondary | ICD-10-CM | POA: Diagnosis not present

## 2017-09-24 DIAGNOSIS — N3090 Cystitis, unspecified without hematuria: Secondary | ICD-10-CM | POA: Diagnosis not present

## 2017-09-24 DIAGNOSIS — N135 Crossing vessel and stricture of ureter without hydronephrosis: Secondary | ICD-10-CM | POA: Diagnosis not present

## 2017-09-24 DIAGNOSIS — N390 Urinary tract infection, site not specified: Secondary | ICD-10-CM | POA: Diagnosis not present

## 2017-10-01 ENCOUNTER — Other Ambulatory Visit: Payer: Self-pay | Admitting: Neurology

## 2017-10-01 MED ORDER — DONEPEZIL HCL 10 MG PO TABS: ORAL_TABLET | ORAL | 1 refills | Status: DC

## 2017-10-07 DIAGNOSIS — I10 Essential (primary) hypertension: Secondary | ICD-10-CM | POA: Diagnosis not present

## 2017-10-07 DIAGNOSIS — R339 Retention of urine, unspecified: Secondary | ICD-10-CM | POA: Diagnosis not present

## 2017-10-07 DIAGNOSIS — N319 Neuromuscular dysfunction of bladder, unspecified: Secondary | ICD-10-CM | POA: Diagnosis not present

## 2017-10-07 DIAGNOSIS — E785 Hyperlipidemia, unspecified: Secondary | ICD-10-CM | POA: Diagnosis not present

## 2017-10-07 DIAGNOSIS — N390 Urinary tract infection, site not specified: Secondary | ICD-10-CM | POA: Diagnosis not present

## 2017-10-10 DIAGNOSIS — R339 Retention of urine, unspecified: Secondary | ICD-10-CM | POA: Diagnosis not present

## 2017-10-10 DIAGNOSIS — R310 Gross hematuria: Secondary | ICD-10-CM | POA: Diagnosis not present

## 2017-10-10 DIAGNOSIS — N319 Neuromuscular dysfunction of bladder, unspecified: Secondary | ICD-10-CM | POA: Diagnosis not present

## 2017-10-14 DIAGNOSIS — I10 Essential (primary) hypertension: Secondary | ICD-10-CM | POA: Diagnosis not present

## 2017-10-14 DIAGNOSIS — E785 Hyperlipidemia, unspecified: Secondary | ICD-10-CM | POA: Diagnosis not present

## 2017-10-14 DIAGNOSIS — J449 Chronic obstructive pulmonary disease, unspecified: Secondary | ICD-10-CM | POA: Diagnosis not present

## 2017-10-14 DIAGNOSIS — N183 Chronic kidney disease, stage 3 (moderate): Secondary | ICD-10-CM | POA: Diagnosis not present

## 2017-11-07 DIAGNOSIS — N319 Neuromuscular dysfunction of bladder, unspecified: Secondary | ICD-10-CM | POA: Diagnosis not present

## 2017-11-07 DIAGNOSIS — R339 Retention of urine, unspecified: Secondary | ICD-10-CM | POA: Diagnosis not present

## 2017-11-07 DIAGNOSIS — N390 Urinary tract infection, site not specified: Secondary | ICD-10-CM | POA: Diagnosis not present

## 2017-11-22 ENCOUNTER — Emergency Department (HOSPITAL_COMMUNITY): Payer: Medicare Other

## 2017-11-22 ENCOUNTER — Inpatient Hospital Stay (HOSPITAL_COMMUNITY)
Admission: EM | Admit: 2017-11-22 | Discharge: 2017-11-27 | DRG: 100 | Discharge status: Against Medical Advice | Payer: Medicare Other | Attending: Pulmonary Disease | Admitting: Pulmonary Disease

## 2017-11-22 ENCOUNTER — Other Ambulatory Visit: Payer: Self-pay

## 2017-11-22 ENCOUNTER — Encounter (HOSPITAL_COMMUNITY): Payer: Self-pay | Admitting: *Deleted

## 2017-11-22 DIAGNOSIS — Z978 Presence of other specified devices: Secondary | ICD-10-CM

## 2017-11-22 DIAGNOSIS — N179 Acute kidney failure, unspecified: Secondary | ICD-10-CM

## 2017-11-22 DIAGNOSIS — H518 Other specified disorders of binocular movement: Secondary | ICD-10-CM | POA: Diagnosis present

## 2017-11-22 DIAGNOSIS — G40901 Epilepsy, unspecified, not intractable, with status epilepticus: Secondary | ICD-10-CM | POA: Diagnosis not present

## 2017-11-22 DIAGNOSIS — Z906 Acquired absence of other parts of urinary tract: Secondary | ICD-10-CM

## 2017-11-22 DIAGNOSIS — F17210 Nicotine dependence, cigarettes, uncomplicated: Secondary | ICD-10-CM | POA: Diagnosis present

## 2017-11-22 DIAGNOSIS — R569 Unspecified convulsions: Secondary | ICD-10-CM | POA: Diagnosis not present

## 2017-11-22 DIAGNOSIS — F0280 Dementia in other diseases classified elsewhere without behavioral disturbance: Secondary | ICD-10-CM | POA: Diagnosis present

## 2017-11-22 DIAGNOSIS — R404 Transient alteration of awareness: Secondary | ICD-10-CM | POA: Diagnosis not present

## 2017-11-22 DIAGNOSIS — Z9911 Dependence on respirator [ventilator] status: Secondary | ICD-10-CM | POA: Diagnosis not present

## 2017-11-22 DIAGNOSIS — I6789 Other cerebrovascular disease: Secondary | ICD-10-CM | POA: Diagnosis not present

## 2017-11-22 DIAGNOSIS — A419 Sepsis, unspecified organism: Secondary | ICD-10-CM | POA: Diagnosis not present

## 2017-11-22 DIAGNOSIS — G309 Alzheimer's disease, unspecified: Secondary | ICD-10-CM | POA: Diagnosis present

## 2017-11-22 DIAGNOSIS — E162 Hypoglycemia, unspecified: Secondary | ICD-10-CM | POA: Diagnosis present

## 2017-11-22 DIAGNOSIS — J9602 Acute respiratory failure with hypercapnia: Secondary | ICD-10-CM | POA: Diagnosis present

## 2017-11-22 DIAGNOSIS — Z8614 Personal history of Methicillin resistant Staphylococcus aureus infection: Secondary | ICD-10-CM

## 2017-11-22 DIAGNOSIS — I160 Hypertensive urgency: Secondary | ICD-10-CM | POA: Diagnosis present

## 2017-11-22 DIAGNOSIS — R29810 Facial weakness: Secondary | ICD-10-CM | POA: Diagnosis not present

## 2017-11-22 DIAGNOSIS — D649 Anemia, unspecified: Secondary | ICD-10-CM | POA: Diagnosis present

## 2017-11-22 DIAGNOSIS — E785 Hyperlipidemia, unspecified: Secondary | ICD-10-CM | POA: Diagnosis present

## 2017-11-22 DIAGNOSIS — Z8673 Personal history of transient ischemic attack (TIA), and cerebral infarction without residual deficits: Secondary | ICD-10-CM

## 2017-11-22 DIAGNOSIS — R296 Repeated falls: Secondary | ICD-10-CM | POA: Diagnosis present

## 2017-11-22 DIAGNOSIS — J439 Emphysema, unspecified: Secondary | ICD-10-CM | POA: Diagnosis present

## 2017-11-22 DIAGNOSIS — Z7902 Long term (current) use of antithrombotics/antiplatelets: Secondary | ICD-10-CM

## 2017-11-22 DIAGNOSIS — R402312 Coma scale, best motor response, none, at arrival to emergency department: Secondary | ICD-10-CM | POA: Diagnosis present

## 2017-11-22 DIAGNOSIS — N189 Chronic kidney disease, unspecified: Secondary | ICD-10-CM | POA: Diagnosis present

## 2017-11-22 DIAGNOSIS — E875 Hyperkalemia: Secondary | ICD-10-CM | POA: Diagnosis present

## 2017-11-22 DIAGNOSIS — R29818 Other symptoms and signs involving the nervous system: Secondary | ICD-10-CM | POA: Diagnosis not present

## 2017-11-22 DIAGNOSIS — R402212 Coma scale, best verbal response, none, at arrival to emergency department: Secondary | ICD-10-CM | POA: Diagnosis present

## 2017-11-22 DIAGNOSIS — E43 Unspecified severe protein-calorie malnutrition: Secondary | ICD-10-CM | POA: Diagnosis present

## 2017-11-22 DIAGNOSIS — N390 Urinary tract infection, site not specified: Secondary | ICD-10-CM | POA: Diagnosis present

## 2017-11-22 DIAGNOSIS — E872 Acidosis: Secondary | ICD-10-CM | POA: Diagnosis present

## 2017-11-22 DIAGNOSIS — R4182 Altered mental status, unspecified: Secondary | ICD-10-CM | POA: Diagnosis not present

## 2017-11-22 DIAGNOSIS — J9600 Acute respiratory failure, unspecified whether with hypoxia or hypercapnia: Secondary | ICD-10-CM | POA: Diagnosis not present

## 2017-11-22 DIAGNOSIS — Z87442 Personal history of urinary calculi: Secondary | ICD-10-CM

## 2017-11-22 DIAGNOSIS — Z681 Body mass index (BMI) 19 or less, adult: Secondary | ICD-10-CM

## 2017-11-22 DIAGNOSIS — Z452 Encounter for adjustment and management of vascular access device: Secondary | ICD-10-CM

## 2017-11-22 DIAGNOSIS — I129 Hypertensive chronic kidney disease with stage 1 through stage 4 chronic kidney disease, or unspecified chronic kidney disease: Secondary | ICD-10-CM | POA: Diagnosis present

## 2017-11-22 DIAGNOSIS — D729 Disorder of white blood cells, unspecified: Secondary | ICD-10-CM | POA: Diagnosis not present

## 2017-11-22 DIAGNOSIS — Z4682 Encounter for fitting and adjustment of non-vascular catheter: Secondary | ICD-10-CM | POA: Diagnosis not present

## 2017-11-22 DIAGNOSIS — Z89422 Acquired absence of other left toe(s): Secondary | ICD-10-CM

## 2017-11-22 DIAGNOSIS — J9690 Respiratory failure, unspecified, unspecified whether with hypoxia or hypercapnia: Secondary | ICD-10-CM

## 2017-11-22 HISTORY — DX: Unspecified convulsions: R56.9

## 2017-11-22 LAB — I-STAT ARTERIAL BLOOD GAS, ED
Acid-base deficit: 11 mmol/L — ABNORMAL HIGH (ref 0.0–2.0)
BICARBONATE: 16.1 mmol/L — AB (ref 20.0–28.0)
O2 SAT: 100 %
PCO2 ART: 40.4 mmHg (ref 32.0–48.0)
PH ART: 7.204 — AB (ref 7.350–7.450)
Patient temperature: 97.1
TCO2: 17 mmol/L — AB (ref 22–32)
pO2, Arterial: 399 mmHg — ABNORMAL HIGH (ref 83.0–108.0)

## 2017-11-22 LAB — DIFFERENTIAL
BASOS PCT: 1 %
Basophils Absolute: 0.2 10*3/uL — ABNORMAL HIGH (ref 0.0–0.1)
EOS ABS: 0.4 10*3/uL (ref 0.0–0.7)
Eosinophils Relative: 2 %
LYMPHS PCT: 42 %
Lymphs Abs: 8.2 10*3/uL — ABNORMAL HIGH (ref 0.7–4.0)
MONOS PCT: 9 %
Monocytes Absolute: 1.8 10*3/uL — ABNORMAL HIGH (ref 0.1–1.0)
Neutro Abs: 9 10*3/uL — ABNORMAL HIGH (ref 1.7–7.7)
Neutrophils Relative %: 46 %

## 2017-11-22 LAB — COMPREHENSIVE METABOLIC PANEL
ALK PHOS: 141 U/L — AB (ref 38–126)
ALT: 12 U/L — AB (ref 14–54)
AST: 20 U/L (ref 15–41)
Albumin: 3.5 g/dL (ref 3.5–5.0)
Anion gap: 7 (ref 5–15)
BUN: 23 mg/dL — AB (ref 6–20)
CHLORIDE: 116 mmol/L — AB (ref 101–111)
CO2: 14 mmol/L — ABNORMAL LOW (ref 22–32)
CREATININE: 1.66 mg/dL — AB (ref 0.44–1.00)
Calcium: 8 mg/dL — ABNORMAL LOW (ref 8.9–10.3)
GFR calc Af Amer: 36 mL/min — ABNORMAL LOW (ref >60)
GFR, EST NON AFRICAN AMERICAN: 31 mL/min — AB (ref >60)
Glucose, Bld: 132 mg/dL — ABNORMAL HIGH (ref 65–99)
Potassium: 4.5 mmol/L (ref 3.5–5.1)
SODIUM: 137 mmol/L (ref 135–145)
Total Bilirubin: 0.2 mg/dL — ABNORMAL LOW (ref 0.3–1.2)
Total Protein: 6.7 g/dL (ref 6.5–8.1)

## 2017-11-22 LAB — PROTIME-INR
INR: 1.07
PROTHROMBIN TIME: 13.8 s (ref 11.4–15.2)

## 2017-11-22 LAB — CBC
HEMATOCRIT: 37.1 % (ref 36.0–46.0)
HEMOGLOBIN: 11.6 g/dL — AB (ref 12.0–15.0)
MCH: 29.4 pg (ref 26.0–34.0)
MCHC: 31.3 g/dL (ref 30.0–36.0)
MCV: 94.2 fL (ref 78.0–100.0)
PLATELETS: 206 10*3/uL (ref 150–400)
RBC: 3.94 MIL/uL (ref 3.87–5.11)
RDW: 14.2 % (ref 11.5–15.5)
WBC: 19.6 10*3/uL — AB (ref 4.0–10.5)

## 2017-11-22 LAB — PHENYTOIN LEVEL, TOTAL: Phenytoin Lvl: 8.8 ug/mL — ABNORMAL LOW (ref 10.0–20.0)

## 2017-11-22 LAB — APTT: aPTT: 31 seconds (ref 24–36)

## 2017-11-22 LAB — I-STAT CHEM 8, ED
BUN: 36 mg/dL — AB (ref 6–20)
CALCIUM ION: 0.95 mmol/L — AB (ref 1.15–1.40)
CHLORIDE: 119 mmol/L — AB (ref 101–111)
CREATININE: 1.6 mg/dL — AB (ref 0.44–1.00)
GLUCOSE: 132 mg/dL — AB (ref 65–99)
HCT: 36 % (ref 36.0–46.0)
Hemoglobin: 12.2 g/dL (ref 12.0–15.0)
Potassium: 5.6 mmol/L — ABNORMAL HIGH (ref 3.5–5.1)
Sodium: 139 mmol/L (ref 135–145)
TCO2: 19 mmol/L — AB (ref 22–32)

## 2017-11-22 LAB — I-STAT TROPONIN, ED: Troponin i, poc: 0.12 ng/mL (ref 0.00–0.08)

## 2017-11-22 LAB — ETHANOL: Alcohol, Ethyl (B): <10 mg/dL (ref <10)

## 2017-11-22 LAB — CBG MONITORING, ED: Glucose-Capillary: 129 mg/dL — ABNORMAL HIGH (ref 65–99)

## 2017-11-22 MED ORDER — LORAZEPAM 2 MG/ML IJ SOLN
INTRAMUSCULAR | Status: AC
  Administered 2017-11-22: 2 mg via INTRAVENOUS
  Filled 2017-11-22: qty 1

## 2017-11-22 MED ORDER — IOPAMIDOL (ISOVUE-370) INJECTION 76%
INTRAVENOUS | Status: AC
  Filled 2017-11-22: qty 100

## 2017-11-22 MED ORDER — FENTANYL CITRATE (PF) 100 MCG/2ML IJ SOLN
100.0000 ug | Freq: Once | INTRAMUSCULAR | Status: AC
  Administered 2017-11-22: 100 ug via INTRAVENOUS
  Filled 2017-11-22: qty 2

## 2017-11-22 MED ORDER — LORAZEPAM 2 MG/ML IJ SOLN
2.0000 mg | Freq: Once | INTRAMUSCULAR | Status: AC
  Administered 2017-11-22: 2 mg via INTRAVENOUS
  Filled 2017-11-22: qty 1

## 2017-11-22 MED ORDER — HEPARIN SODIUM (PORCINE) 5000 UNIT/ML IJ SOLN
5000.0000 [IU] | Freq: Three times a day (TID) | INTRAMUSCULAR | Status: DC
  Administered 2017-11-23 – 2017-11-25 (×7): 5000 [IU] via SUBCUTANEOUS
  Filled 2017-11-22 (×9): qty 1

## 2017-11-22 MED ORDER — LEVETIRACETAM 500 MG/5ML IV SOLN
500.0000 mg | Freq: Two times a day (BID) | INTRAVENOUS | Status: DC
  Administered 2017-11-23 – 2017-11-27 (×9): 500 mg via INTRAVENOUS
  Filled 2017-11-22 (×10): qty 5

## 2017-11-22 MED ORDER — PROPOFOL 1000 MG/100ML IV EMUL
INTRAVENOUS | Status: AC
  Filled 2017-11-22: qty 100

## 2017-11-22 MED ORDER — PANTOPRAZOLE SODIUM 40 MG IV SOLR
40.0000 mg | Freq: Every day | INTRAVENOUS | Status: DC
  Administered 2017-11-23 – 2017-11-26 (×5): 40 mg via INTRAVENOUS
  Filled 2017-11-22 (×6): qty 40

## 2017-11-22 MED ORDER — SODIUM CHLORIDE 0.9 % IV SOLN
1000.0000 mg | Freq: Once | INTRAVENOUS | Status: AC
  Administered 2017-11-22: 1000 mg via INTRAVENOUS
  Filled 2017-11-22: qty 10

## 2017-11-22 MED ORDER — FENTANYL CITRATE (PF) 100 MCG/2ML IJ SOLN: 50.0000 ug | INTRAMUSCULAR | Status: DC | PRN

## 2017-11-22 MED ORDER — MIDAZOLAM HCL 2 MG/2ML IJ SOLN
INTRAMUSCULAR | Status: AC
  Filled 2017-11-22: qty 2

## 2017-11-22 MED ORDER — PROPOFOL 1000 MG/100ML IV EMUL
5.0000 ug/kg/min | INTRAVENOUS | Status: DC
  Administered 2017-11-22: 5 ug/kg/min via INTRAVENOUS
  Administered 2017-11-23: 50 ug/kg/min via INTRAVENOUS
  Administered 2017-11-23: 25 ug/kg/min via INTRAVENOUS
  Administered 2017-11-24 – 2017-11-25 (×3): 50 ug/kg/min via INTRAVENOUS
  Filled 2017-11-22 (×11): qty 100

## 2017-11-22 MED ORDER — FENTANYL CITRATE (PF) 100 MCG/2ML IJ SOLN
50.0000 ug | INTRAMUSCULAR | Status: DC | PRN
  Administered 2017-11-24 – 2017-11-27 (×6): 50 ug via INTRAVENOUS
  Filled 2017-11-22 (×6): qty 2

## 2017-11-22 MED ORDER — SODIUM CHLORIDE 0.9 % IV SOLN: 250.0000 mL | INTRAVENOUS | Status: DC | PRN

## 2017-11-22 NOTE — ED Notes (Signed)
Critical care at the bedside. 

## 2017-11-22 NOTE — ED Notes (Signed)
Etomidate 12mg  iv kaleigh  succ 60 mg iv kaleigh rn

## 2017-11-22 NOTE — ED Notes (Signed)
Son cell [phone]  Home [phone]

## 2017-11-22 NOTE — ED Notes (Signed)
XR at bedside

## 2017-11-22 NOTE — ED Notes (Signed)
IV team at bedside 

## 2017-11-22 NOTE — H&P (Signed)
PULMONARY / CRITICAL CARE MEDICINE   Name: [name] MRN: [id number] DOB: September 25, 1951    ADMISSION DATE:  11/22/2017 CONSULTATION DATE:  11/22/2017  REFERRING MD:  Dr. Wynelle Cleveland   CHIEF COMPLAINT:  AMS   HISTORY OF PRESENT ILLNESS:   66 year old female with PMH of Alzheimer's Dementia, COPD, HLD, HTN, Insomnia, Recurrent UTI due to Self-Cath given Artifical Bladder   Presents to ED on 12/14. Family reports that patient has been feeling unwell since this morning, given general complaints. Has recurrent UTIs and was recently started on antibiotic however family unsure of name. This afternoon patient was walking, became nauseous, has facial twitching and right sided weakness. Upon arrival to ED given 2 mg versed. Intubated. CT head negative. Neurology consulted. PCCM asked to admit.   PAST MEDICAL HISTORY :  She  has a past medical history of Alzheimer's dementia, Anemia, COPD (chronic obstructive pulmonary disease) ([place]), History of blood transfusion, History of MRSA infection, Hyperlipidemia, Hypertension, Insomnia, Renal disorder, Subacute delirium (10/14/2013), Toxic encephalopathy, and UTI (lower urinary tract infection).  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  Allergies  Allergen Reactions  . Codeine Nausea And Vomiting    REACTION: intolerance  . Latex     itching    Current Facility-Administered Medications on File Prior to Encounter  Medication  . doxylamine (Sleep) (UNISOM) tablet 25 mg   Current Outpatient Medications on File Prior to Encounter  Medication Sig  . atorvastatin (LIPITOR) 10 MG tablet Take 10 mg by mouth daily.   . carvedilol (COREG) 12.5 MG tablet   . citalopram (CELEXA) 40 MG tablet Take 1 tablet (40 mg total) by mouth daily.  .  clopidogrel (PLAVIX) 75 MG tablet Take 75 mg by mouth daily.  Marland Kitchen donepezil (ARICEPT) 10 MG tablet TAKE 1 BY MOUTH AT BEDTIME  . LORazepam (ATIVAN) 0.5 MG tablet Take one tablet 30 min. prior to MRI.  Take 2nd tablet to MRI appt. and repeat if needed.  You must not drink alcohol, drive, or operate dangerous equipment when taking this medication.  . memantine (NAMENDA) 10 MG tablet Take 1 tablet (10 mg total) by mouth 2 (two) times daily.  . phenytoin (DILANTIN) 100 MG ER capsule Take 2 capsules (200 mg total) by mouth at bedtime.    FAMILY HISTORY:  Her indicated that her mother is deceased. She indicated that her father is deceased. She indicated that her brother is alive.   SOCIAL HISTORY: She  reports that she has been smoking cigarettes.  She has been smoking about 0.75 packs per day. she has never used smokeless tobacco. She reports that she does not drink alcohol or use drugs.  REVIEW OF SYSTEMS:   Unable to review as patient is intubated and sedated   SUBJECTIVE:   VITAL SIGNS: BP 135/76   Pulse 93   Resp 15   Ht 4\' 11"  (5.809 m)   Wt 39.9 kg (88 lb)   SpO2 100%   BMI 17.77 kg/m   HEMODYNAMICS:    VENTILATOR SETTINGS: Vent Mode: PRVC FiO2 (%):  [40 %-100 %] 40 % Set Rate:  [15 bmp] 15 bmp Vt Set:  [400 mL] 400 mL PEEP:  [5 cmH20] 5 cmH20 Plateau Pressure:  [14 cmH20-15 cmH20] 14 cmH20  INTAKE / OUTPUT: No intake/output data recorded.  PHYSICAL EXAMINATION: General:  Adult female, on vent  Neuro:  Sedated, withdrawals from pain, does not follow commands  HEENT:  ETT in place  Cardiovascular:  RRR, no MRG  Lungs:  Clear breath sounds, no wheeze/crackles  Abdomen:  Non-distended, active bowel sounds  Musculoskeletal:  -edema  Skin:  Warm, dry   LABS:  BMET Recent Labs  Lab 11/22/17 2140 11/22/17 2150  NA 137 139  K 4.5 5.6*  CL 116* 119*  CO2 14*  --   BUN 23* 36*  CREATININE 1.66* 1.60*  GLUCOSE 132* 132*    Electrolytes Recent Labs  Lab  11/22/17 2140  CALCIUM 8.0*    CBC Recent Labs  Lab 11/22/17 2140 11/22/17 2150  WBC 19.6*  --   HGB 11.6* 12.2  HCT 37.1 36.0  PLT 206  --     Coag's Recent Labs  Lab 11/22/17 2140  APTT 31  INR 1.07    Sepsis Markers No results for input(s): LATICACIDVEN, PROCALCITON, O2SATVEN in the last 168 hours.  ABG Recent Labs  Lab 11/22/17 2308  PHART 7.204*  PCO2ART 40.4  PO2ART 399.0*    Liver Enzymes Recent Labs  Lab 11/22/17 2140  AST 20  ALT 12*  ALKPHOS 141*  BILITOT 0.2*  ALBUMIN 3.5    Cardiac Enzymes No results for input(s): TROPONINI, PROBNP in the last 168 hours.  Glucose Recent Labs  Lab 11/22/17 2143  GLUCAP 129*    Imaging Dg Chest 1 View  Result Date: 11/22/2017 CLINICAL DATA:  Endotracheal tube placement. EXAM: CHEST 1 VIEW COMPARISON:  Chest radiograph and CTA of the chest performed 06/29/2017 FINDINGS: The patient's endotracheal tube is seen ending 3-4 cm above the carina. The lungs are well-aerated and clear. There is no evidence of focal opacification, pleural effusion or pneumothorax. The cardiomediastinal silhouette is within normal limits. No acute osseous abnormalities are seen. IMPRESSION: 1. Endotracheal tube seen ending 3-4 cm above the carina. 2. No acute cardiopulmonary process seen. Electronically Signed   By: Garald Balding M.D.   On: 11/22/2017 22:49   Dg Abd Portable 1 View  Result Date: 11/22/2017 CLINICAL DATA:  Orogastric tube placement. EXAM: PORTABLE ABDOMEN - 1 VIEW COMPARISON:  Abdominal radiograph performed 04/11/2017 FINDINGS: The patient's enteric tube is noted ending overlying the body of the stomach. A left ureteral stent is seen overlying expected position. The visualized bowel gas pattern is grossly unremarkable. A small amount of air is seen within the stomach. No free intra-abdominal air is seen, though evaluation for free air is limited on a single supine view. The visualized portions of the lungs are clear.  Postoperative change is noted at the pelvis. No acute osseous abnormalities are seen. IMPRESSION: Enteric tube noted ending overlying the body of the stomach. Electronically Signed   By: Garald Balding M.D.   On: 11/22/2017 23:35   Ct Head Code Stroke Wo Contrast  Result Date: 11/22/2017 CLINICAL DATA:  Code stroke.  Seizure EXAM: CT HEAD WITHOUT CONTRAST TECHNIQUE: Contiguous axial images were obtained from the base of the skull through the vertex without intravenous contrast. COMPARISON:  Head CT 01/19/2017 FINDINGS: Brain: No mass lesion or acute hemorrhage. No focal hypoattenuation of the basal ganglia or cortex to indicate infarcted tissue. Generalized atrophy, mildly advanced for age. There is periventricular hypoattenuation compatible with chronic microvascular disease. Vascular: No hyperdense vessel. No advanced atherosclerotic calcification of the arteries at the skull base. Skull: Normal visualized skull base, calvarium and extracranial soft tissues. Sinuses/Orbits: No sinus fluid levels or advanced  mucosal thickening. No mastoid effusion. Normal orbits. ASPECTS [facility] Stroke Program Early CT Score) - Ganglionic level infarction (caudate, lentiform nuclei, internal capsule, insula, M1-M3 cortex): 7 - Supraganglionic infarction (M4-M6 cortex): 3 Total score (0-10 with 10 being normal): 10 IMPRESSION: 1. No acute hemorrhage. 2. ASPECTS is 10. 3. Findings of chronic microvascular ischemia and mild volume loss. These results were called by telephone at the time of interpretation on 11/22/2017 at 10:26 pm to Dr. Varney Biles , who verbally acknowledged these results. Electronically Signed   By: Ulyses Jarred M.D.   On: 11/22/2017 22:28     STUDIES:  CT Head 12/14 > No acute hemorrhage. ASPECTS is 10. Findings of chronic microvascular ischemia and mild volume loss. CXR 12/14 > The lungs are well-aerated and clear. There is no evidence of focal opacification, pleural effusion or pneumothorax. The  cardiomediastinal silhouette is within normal limits. No acute osseous abnormalities are seen.  CULTURES: U/A 12/14 >> Sputum 12/14 >> Blood 12/14 >>   ANTIBIOTICS: Zosyn 12/15 >>   SIGNIFICANT EVENTS: 12/14 > Presents to ED   LINES/TUBES: ETT 12/14 >>  DISCUSSION: 66 year old female presents to ED with facial twitching and right sided weakness. Intubated in ED. CT head negative. Neurology consulted.   ASSESSMENT / PLAN:  PULMONARY A: Respiratory Insuffiencey in setting of Encephalopathy  H/O COPD, Tobacco Abuse  P:   Vent Support Trend ABG/CXR Pulmonary Hygiene  VAP Bundle   CARDIOVASCULAR A:  HTN Urgency  Systolic 458 on arrival   H/O HLD, HTN P:  Cardiac Monitoring  Hydralazine PRN for systolic >099  Hold home Norvasc, Coreg  Trend Troponin  Continue Lipitor and Plavix   RENAL A:   Hyperkalemia  AKI (Unsure baseline Crt)  H/O Artifical Bladder with self-cath  P:   Trend BMP > Repeat ordered STAT  Replace electrolytes as indicated   GASTROINTESTINAL A:   SUP P:   NPO PPI  HEMATOLOGIC A:   No issues  P:  Trend CBC   INFECTIOUS A:   Recurrent UTIs  Possible Aspiration Event  P:   Trend WBC and Fever Curve Trend PCT and LCT  Follow Culture Data  Zosyn   ENDOCRINE A:   No issues    P:   Trend glucose   NEUROLOGIC A:   Metabolic Encephalopathy vs Status Epilepticus  Family unsure if patient took to much of her prescription medications, she has done this in the past accidentally, they usually leave medications locked up  H/O Dementia, Seizure Disorder, Insomnia, TIA  P:   RASS goal: 0/-1  Neurology Following  EEG/MRI Brain pending  Loaded with Keppra 1gram in ED  Keppra 500 BID  Continue Dilantin > Level Pending   Wean Propofol to achieve RASS PRN Fentanyl   FAMILY  - Updates: Family updated at bedside   - Inter-disciplinary family meet or Palliative Care meeting due by: 11/30/2017    CC Time: 49 minutes   Hayden Pedro, AGACNP-BC [facility]  Pgr: [phone]  PCCM Pgr: [phone]

## 2017-11-22 NOTE — ED Notes (Signed)
Pt going to xray noe  Per edps order for chest xrazy

## 2017-11-22 NOTE — ED Notes (Signed)
IV team at bedside. Multiple unsuccessful attempts to start IV from this RN. Aaron Edelman, RN unsuccessful attempt x 1.

## 2017-11-22 NOTE — ED Notes (Signed)
To c-t after intubation

## 2017-11-22 NOTE — ED Notes (Signed)
Neurology at bedside.

## 2017-11-22 NOTE — ED Triage Notes (Signed)
Versed 2mg  iv

## 2017-11-22 NOTE — ED Notes (Signed)
ACTIVATED CODE STROKE @ 21:41

## 2017-11-22 NOTE — ED Notes (Signed)
Intubated by the ed resident

## 2017-11-22 NOTE — ED Triage Notes (Signed)
Pt from home via [facility] as Code Stroke. Per EMS, pt LSN at approx 2035. Per EMS, pt was walking to the kitchen when she reported she felt she would throw up and then became unresponsive and fell to the floor. Pt with sever R sided weakness and R sided facial droop, unresponsive, with fixed R sided gaze. NPA placed en route. 20 G R wrist. EMS VS: BP 247 systolic, HR 99Q.

## 2017-11-22 NOTE — ED Notes (Signed)
Per neurologist, family reports witnessed seizure like activity

## 2017-11-22 NOTE — ED Provider Notes (Signed)
[facility] EMERGENCY DEPARTMENT Provider Note   CSN: [id number] Arrival date & time: 11/22/17  2140   An emergency department physician performed an initial assessment on this suspected stroke patient at 2215.  History   Chief Complaint Chief Complaint  Patient presents with  . Code Stroke    HPI [name] is a 66 y.o. female.  The history is provided by the EMS personnel.  , history obtained by EMS. 65yoF with hx of dementia, COPD, HLD, HTN, seizures on dilantin brought in as a code stroke for R sided weakness and AMS. LKN 2035. Pt was acting completely normal and then noted to have R sided weakness, confusion, vomiting, and then she became unresponsive. When EMS arrived, she was unresponsive and noted to have R Gaze preference and R sided weakness.  They state that when her right arm was lifted it felt limp when her left arm was lifted she held up on her own.  Was not following commands.  She was noted to flex her left hip and knee spontaneously but no noted movements to the right leg.  No further history available at this time.  Patient unresponsive and unable to provide further history.  Past Medical History:  Diagnosis Date  . Alzheimer's dementia    takes Aricept nightly  . Anemia   . COPD (chronic obstructive pulmonary disease) ([place])   . History of blood transfusion    received 2units on Feb 17,2015  . History of MRSA infection    several yrs ago  . Hyperlipidemia    takes Atorvastatin daily  . Hypertension    takes Amlodipine and Carvedilol daily  . Insomnia    takes Ambien nightly  . Renal disorder    kidney stones  . Seizures ([place])   . Subacute delirium 10/14/2013   takes Seroquel and Dilantin nightly  . Toxic encephalopathy   . UTI (lower urinary tract infection)    takes Macrodantin daily    Patient Active Problem List   Diagnosis Date Noted  . Seizure ([place]) 11/22/2017  . Iliac artery stenosis, left ([place]) 05/14/2014  . Swollen feet  02/01/2014  . Pain in limb-Left Great toe, 4th and  5th digit 02/01/2014  . Thromboembolism of lower extremity artery ([place]) 02/01/2014  . UTI (urinary tract infection) due to Enterococcus 01/20/2014  . Acute blood loss anemia 01/20/2014  . Protein-calorie malnutrition, severe ([place]) 01/18/2014  . Critical lower limb ischemia 01/17/2014  . Subacute delirium 10/14/2013  . VITAMIN B12 DEFICIENCY 11/01/2008  . DYSPHAGIA UNSPECIFIED 10/28/2008  . HYPOVOLEMIA 10/25/2008  . 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 [place] DISEASE 10/25/2008  . CHRONIC OBSTRUCTIVE PULMONARY DISEASE 10/25/2008  . ESOPHAGEAL STRICTURE 10/25/2008  . GERD 10/25/2008  . HIATAL HERNIA 10/25/2008  . SMALL BOWEL OBSTRUCTION 10/25/2008  . IBS 10/25/2008  . OTHER RETROPERITONEAL ABSCESS 10/25/2008  . BLIND LOOP SYNDROME 10/25/2008  . OTHER AND UNSPECIFIED POSTSURGICAL NONABSORPTION 10/25/2008  . PYELONEPHRITIS 10/25/2008  . RENAL CALCULUS 10/25/2008  . INTERSTITIAL CYSTITIS 10/25/2008  . ENDOMETRIOSIS 10/25/2008  . PSORIASIS 10/25/2008  . HEADACHE, CHRONIC 10/25/2008  . COLONIC POLYPS, HX OF 10/25/2008    Past Surgical History:  Procedure Laterality Date  . ABDOMINAL ANGIOGRAM N/A 01/20/2014   Procedure: ABDOMINAL ANGIOGRAM;  Surgeon: Elam Dutch, MD;  Location: [facility] CATH LAB;  Service: Cardiovascular;  Laterality: N/A;  . ABDOMINAL HYSTERECTOMY    . AMPUTATION Left 02/08/2014   Procedure: AMPUTATION Left 4th & 5th toes;  Surgeon: Conrad [place],  MD;  Location: [facility];  Service: Vascular;  Laterality: Left;  . BLADDER SURGERY    . CHOLECYSTECTOMY  1995   Gall Bladder  . INSERTION OF ILIAC STENT Left 01/26/2014   Procedure: INSERTION OF ILIAC STENT- LEFT COMMON ILIAC ARTERY STENT ;  Surgeon: Conrad [place], MD;  Location: [facility];  Service: Vascular;  Laterality: Left;  . KNEE SURGERY      OB History    No data available       Home Medications    Prior to Admission medications   Medication Sig Start Date  End Date Taking? Authorizing Provider  atorvastatin (LIPITOR) 10 MG tablet Take 10 mg by mouth daily.  12/06/13   [provider]  carvedilol (COREG) 12.5 MG tablet  03/26/16   [provider]  citalopram (CELEXA) 40 MG tablet Take 1 tablet (40 mg total) by mouth daily. 11/28/16   Dohmeier, Asencion Partridge, MD  clopidogrel (PLAVIX) 75 MG tablet Take 75 mg by mouth daily.    [provider]  donepezil (ARICEPT) 10 MG tablet TAKE 1 BY MOUTH AT BEDTIME 10/01/17   Dohmeier, Asencion Partridge, MD  LORazepam (ATIVAN) 0.5 MG tablet Take one tablet 30 min. prior to MRI.  Take 2nd tablet to MRI appt. and repeat if needed.  You must not drink alcohol, drive, or operate dangerous equipment when taking this medication. 11/30/16   Sater, Nanine Means, MD  memantine (NAMENDA) 10 MG tablet Take 1 tablet (10 mg total) by mouth 2 (two) times daily. 11/28/16   Dohmeier, Asencion Partridge, MD  phenytoin (DILANTIN) 100 MG ER capsule Take 2 capsules (200 mg total) by mouth at bedtime. 11/28/16   Dohmeier, Asencion Partridge, MD    Family History Family History  Problem Relation Age of Onset  . Cancer Brother     Social History Social History   Tobacco Use  . Smoking status: Current Every Day Smoker    Packs/day: 0.75    Types: Cigarettes  . Smokeless tobacco: Never Used  Substance Use Topics  . Alcohol use: No  . Drug use: No     Allergies   Codeine and Latex   Review of Systems Review of Systems  Unable to perform ROS: Patient unresponsive     Physical Exam Updated Vital Signs BP 128/72   Pulse 93   Resp 15   Ht 4' 11"  (1.499 m)   Wt 39.9 kg (88 lb)   SpO2 100%   BMI 17.77 kg/m   Physical Exam  Constitutional: She has a sickly appearance.  HENT:  Head: Normocephalic and atraumatic.  Eyes: Conjunctivae are normal. Pupils are equal, round, and reactive to light. Right eye exhibits abnormal extraocular motion and nystagmus. Left eye exhibits abnormal extraocular motion and nystagmus.  Right gaze  preference noted.  Patient does not track.  Mild nystagmus noted.  Neck: No tracheal deviation present.  Head held facing the right.  Cardiovascular: Normal rate, regular rhythm, normal heart sounds, intact distal pulses and normal pulses.  Pulmonary/Chest: Bradypnea noted. No tachypnea. She has decreased breath sounds. She has no rhonchi.  Intermittent episodes of apnea noted.  Abdominal: Soft. She exhibits no distension.  Musculoskeletal:  No obvious trauma or deformity noted to the extremities.   Neurological: She is unresponsive. GCS eye subscore is 1. GCS verbal subscore is 1. GCS motor subscore is 1. She displays Babinski's sign on the right side. She displays no Babinski's sign on the left side.  Nursing note and vitals reviewed.  ED Treatments / Results  Labs (all labs ordered are listed, but only abnormal results are displayed) Labs Reviewed  CBC - Abnormal; Notable for the following components:      Result Value   WBC 19.6 (*)    Hemoglobin 11.6 (*)    All other components within normal limits  DIFFERENTIAL - Abnormal; Notable for the following components:   Neutro Abs 9.0 (*)    Lymphs Abs 8.2 (*)    Monocytes Absolute 1.8 (*)    Basophils Absolute 0.2 (*)    All other components within normal limits  COMPREHENSIVE METABOLIC PANEL - Abnormal; Notable for the following components:   Chloride 116 (*)    CO2 14 (*)    Glucose, Bld 132 (*)    BUN 23 (*)    Creatinine, Ser 1.66 (*)    Calcium 8.0 (*)    ALT 12 (*)    Alkaline Phosphatase 141 (*)    Total Bilirubin 0.2 (*)    GFR calc non Af Amer 31 (*)    GFR calc Af Amer 36 (*)    All other components within normal limits  PHENYTOIN LEVEL, TOTAL - Abnormal; Notable for the following components:   Phenytoin Lvl 8.8 (*)    All other components within normal limits  I-STAT CHEM 8, ED - Abnormal; Notable for the following components:   Potassium 5.6 (*)    Chloride 119 (*)    BUN 36 (*)    Creatinine, Ser 1.60  (*)    Glucose, Bld 132 (*)    Calcium, Ion 0.95 (*)    TCO2 19 (*)    All other components within normal limits  I-STAT TROPONIN, ED - Abnormal; Notable for the following components:   Troponin i, poc 0.12 (*)    All other components within normal limits  CBG MONITORING, ED - Abnormal; Notable for the following components:   Glucose-Capillary 129 (*)    All other components within normal limits  I-STAT ARTERIAL BLOOD GAS, ED - Abnormal; Notable for the following components:   pH, Arterial 7.204 (*)    pO2, Arterial 399.0 (*)    Bicarbonate 16.1 (*)    TCO2 17 (*)    Acid-base deficit 11.0 (*)    All other components within normal limits  CULTURE, BLOOD (ROUTINE X 2)  CULTURE, BLOOD (ROUTINE X 2)  CULTURE, RESPIRATORY (NON-EXPECTORATED)  URINE CULTURE  ETHANOL  PROTIME-INR  APTT  RAPID URINE DRUG SCREEN, HOSP PERFORMED  URINALYSIS, ROUTINE W REFLEX MICROSCOPIC  TRIGLYCERIDES  BASIC METABOLIC PANEL  BASIC METABOLIC PANEL  CBC  MAGNESIUM  PHOSPHORUS  PROCALCITONIN  PROCALCITONIN  LACTIC ACID, PLASMA  LACTIC ACID, PLASMA  HEPATIC FUNCTION PANEL  TROPONIN I  TROPONIN I  TROPONIN I  BLOOD GAS, ARTERIAL    EKG  EKG Interpretation  Date/Time:  Friday November 22 2017 21:43:53 EST Ventricular Rate:  95 PR Interval:    QRS Duration: 97 QT Interval:  372 QTC Calculation: 468 R Axis:   41 Text Interpretation:  Sinus rhythm Right atrial enlargement ST elevation, consider inferior injury No acute changes No significant change since last tracing Confirmed by Varney Biles ([id number]) on 11/22/2017 10:58:57 PM       Radiology Dg Chest 1 View  Result Date: 11/22/2017 CLINICAL DATA:  Endotracheal tube placement. EXAM: CHEST 1 VIEW COMPARISON:  Chest radiograph and CTA of the chest performed 06/29/2017 FINDINGS: The patient's endotracheal tube is seen ending 3-4 cm above the carina. The lungs are  well-aerated and clear. There is no evidence of focal opacification, pleural  effusion or pneumothorax. The cardiomediastinal silhouette is within normal limits. No acute osseous abnormalities are seen. IMPRESSION: 1. Endotracheal tube seen ending 3-4 cm above the carina. 2. No acute cardiopulmonary process seen. Electronically Signed   By: Garald Balding M.D.   On: 11/22/2017 22:49   Dg Abd Portable 1 View  Result Date: 11/22/2017 CLINICAL DATA:  Orogastric tube placement. EXAM: PORTABLE ABDOMEN - 1 VIEW COMPARISON:  Abdominal radiograph performed 04/11/2017 FINDINGS: The patient's enteric tube is noted ending overlying the body of the stomach. A left ureteral stent is seen overlying expected position. The visualized bowel gas pattern is grossly unremarkable. A small amount of air is seen within the stomach. No free intra-abdominal air is seen, though evaluation for free air is limited on a single supine view. The visualized portions of the lungs are clear. Postoperative change is noted at the pelvis. No acute osseous abnormalities are seen. IMPRESSION: Enteric tube noted ending overlying the body of the stomach. Electronically Signed   By: Garald Balding M.D.   On: 11/22/2017 23:35   Ct Head Code Stroke Wo Contrast  Result Date: 11/22/2017 CLINICAL DATA:  Code stroke.  Seizure EXAM: CT HEAD WITHOUT CONTRAST TECHNIQUE: Contiguous axial images were obtained from the base of the skull through the vertex without intravenous contrast. COMPARISON:  Head CT 01/19/2017 FINDINGS: Brain: No mass lesion or acute hemorrhage. No focal hypoattenuation of the basal ganglia or cortex to indicate infarcted tissue. Generalized atrophy, mildly advanced for age. There is periventricular hypoattenuation compatible with chronic microvascular disease. Vascular: No hyperdense vessel. No advanced atherosclerotic calcification of the arteries at the skull base. Skull: Normal visualized skull base, calvarium and extracranial soft tissues. Sinuses/Orbits: No sinus fluid levels or advanced mucosal thickening.  No mastoid effusion. Normal orbits. ASPECTS [facility] Stroke Program Early CT Score) - Ganglionic level infarction (caudate, lentiform nuclei, internal capsule, insula, M1-M3 cortex): 7 - Supraganglionic infarction (M4-M6 cortex): 3 Total score (0-10 with 10 being normal): 10 IMPRESSION: 1. No acute hemorrhage. 2. ASPECTS is 10. 3. Findings of chronic microvascular ischemia and mild volume loss. These results were called by telephone at the time of interpretation on 11/22/2017 at 10:26 pm to Dr. Varney Biles , who verbally acknowledged these results. Electronically Signed   By: Ulyses Jarred M.D.   On: 11/22/2017 22:28    Procedures Procedure Name: Intubation Date/Time: 11/22/2017 10:50 PM Performed by: Derrel Moore Mali, MD Pre-anesthesia Checklist: Suction available, Emergency Drugs available, Patient identified, Timeout performed and Patient being monitored Oxygen Delivery Method: Nasal cannula Preoxygenation: Pre-oxygenation with 100% oxygen Induction Type: Rapid sequence Ventilation: Mask ventilation without difficulty Laryngoscope Size: Mac and 3 Tube size: 7.5 mm Number of attempts: 1 Airway Equipment and Method: Stylet Placement Confirmation: ETT inserted through vocal cords under direct vision,  CO2 detector and Breath sounds checked- equal and bilateral Secured at: 21 cm Tube secured with: ETT holder      (including critical care time)  Medications Ordered in ED Medications  midazolam (VERSED) 2 MG/2ML injection (not administered)  propofol (DIPRIVAN) 1000 MG/100ML infusion (20 mcg/kg/min  39.9 kg Intravenous Rate/Dose Change 11/22/17 2358)  0.9 %  sodium chloride infusion (not administered)  heparin injection 5,000 Units (not administered)  pantoprazole (PROTONIX) injection 40 mg (not administered)  levETIRAcetam (KEPPRA) 500 mg in sodium chloride 0.9 % 100 mL IVPB (not administered)  fentaNYL (SUBLIMAZE) injection 50 mcg (not administered)  fentaNYL (SUBLIMAZE)  injection 50 mcg (  not administered)  lactated ringers infusion (not administered)  atorvastatin (LIPITOR) tablet 10 mg (not administered)  clopidogrel (PLAVIX) tablet 75 mg (not administered)  hydrALAZINE (APRESOLINE) injection 5 mg (not administered)  levETIRAcetam (KEPPRA) 1,000 mg in sodium chloride 0.9 % 100 mL IVPB (0 mg Intravenous Stopped 11/22/17 2217)  fentaNYL (SUBLIMAZE) injection 100 mcg (100 mcg Intravenous Given 11/22/17 2309)  LORazepam (ATIVAN) injection 2 mg (2 mg Intravenous Given 11/22/17 2315)     Initial Impression / Assessment and Plan / ED Course  I have reviewed the triage vital signs and the nursing notes.  Pertinent labs & imaging results that were available during my care of the patient were reviewed by me and considered in my medical decision making (see chart for details).     66 year old female presenting as a code stroke for concern of right-sided weakness, right gaze preference.  On arrival neurology at bedside.  Patient taking to the trauma room as she was unresponsive.  EMS placed an MPA and required bagging for intermittent apneic episodes but did not become hypoxic.  Found to be hypertensive to the 751W systolic.  On arrival patient noted to have right-sided gaze with nystagmus but then developed into seizure-like activity on the right side of the face.  Patient given 2 mg Versed.  This led to resolution of her seizure-like activity and resolution of the gaze preference or gaze became midline at that point.  As she is unresponsive and having intermittent apneic episodes, patient was intubated for airway protection per the procedure note above.  Concern for focal seizure versus CVA versus less likely drug intoxication/withdrawal.  No documented history of drug abuse or alcohol abuse.  Placed on propofol for sedation. Chest x-ray with ET tube in proper position.  CT head without shows no acute hemorrhage.  Neurology believes this is most likely seizure from her  clinical presentation.  Given 1 g of Keppra. will admit to the ICU for EEG and further management.   Final Clinical Impressions(s) / ED Diagnoses   Final diagnoses:  Ventilator dependent [facility])    ED Discharge Orders    None       Markesia Crilly Mali, MD 11/23/17 0040    Varney Biles, MD 11/24/17 0000    Varney Biles, MD 11/24/17 0001

## 2017-11-22 NOTE — Consult Note (Addendum)
Neurology Consultation  Reason for Consult: Acute code stroke Referring Physician: Dr Kathrynn Humble  CC: Unresponsiveness, right-sided weakness  History is obtained from: Initially from ER providers, EMS and chart.  Later on by family  HPI: [name] is a 66 y.o. female past medical history of dementia, COPD, TIA in the past, tobacco abuse, seizure disorder, insomnia, who was in his usual state of health about 8 PM this evening when she reported to her family that she was not feeling good.  She went to the bathroom in an attempt to throw up when family members noticed that something was not right.  Her speech appeared garbled.  She had a gaze to the right and was twitching on her mouth and face.  Soon after, she became unresponsive.  EMS was called.  They were unable to get any response on the right side of her body, on the left she was still responsive and able to lift the arm up against gravity.  They called an acute code stroke and brought the patient into the emergency room at [facility].  On initial evaluation by the ER, patient was unresponsive, not following any commands.  Soon after, she had a gaze deviation to the right with right facial and then right arm twitching.  She was given 5 mg of Versed IV which subsided the seizure activity.  She had to be intubated for airway protection.  Noncontrast CT of the head was done which did not reveal any acute bleed or stroke. She was given Keppra 1 g IV x1. She will be admitted to the ICU.   LKW: 8 2 8:30 PM on 11/22/2017 tpa given?: no, seizure Premorbid modified Rankin scale (mRS):3  ROS:  Unable to obtain due to altered mental status.   Past Medical History:  Diagnosis Date  . Alzheimer's dementia    takes Aricept nightly  . Anemia   . COPD (chronic obstructive pulmonary disease) ([place])   . History of blood transfusion    received 2units on Feb 17,2015  . History of MRSA infection    several yrs ago  . Hyperlipidemia    takes  Atorvastatin daily  . Hypertension    takes Amlodipine and Carvedilol daily  . Insomnia    takes Ambien nightly  . Renal disorder    kidney stones  . Subacute delirium 10/14/2013   takes Seroquel and Dilantin nightly  . Toxic encephalopathy   . UTI (lower urinary tract infection)    takes Macrodantin daily    Family History  Problem Relation Age of Onset  . Cancer Brother     Social History:   reports that she has been smoking cigarettes.  She has been smoking about 0.75 packs per day. she has never used smokeless tobacco. She reports that she does not drink alcohol or use drugs.  Medications  Current Facility-Administered Medications:  .  iopamidol (ISOVUE-370) 76 % injection, , , ,  .  propofol (DIPRIVAN) 1000 MG/100ML infusion, , , ,  .  doxylamine (Sleep) (UNISOM) tablet 25 mg, 25 mg, Oral, QHS PRN, Dohmeier, Asencion Partridge, MD .  levETIRAcetam (KEPPRA) 1,000 mg in sodium chloride 0.9 % 100 mL IVPB, 1,000 mg, Intravenous, Once, Amie Portland, MD .  LORazepam (ATIVAN) 2 MG/ML injection, , , ,  .  midazolam (VERSED) 2 MG/2ML injection, , , ,   Current Outpatient Medications:  .  atorvastatin (LIPITOR) 10 MG tablet, Take 10 mg by mouth daily. , Disp: , Rfl:  .  carvedilol (  COREG) 12.5 MG tablet, , Disp: , Rfl: 10 .  citalopram (CELEXA) 40 MG tablet, Take 1 tablet (40 mg total) by mouth daily., Disp: 90 tablet, Rfl: 1 .  clopidogrel (PLAVIX) 75 MG tablet, Take 75 mg by mouth daily., Disp: , Rfl:  .  donepezil (ARICEPT) 10 MG tablet, TAKE 1 BY MOUTH AT BEDTIME, Disp: 90 tablet, Rfl: 1 .  LORazepam (ATIVAN) 0.5 MG tablet, Take one tablet 30 min. prior to MRI.  Take 2nd tablet to MRI appt. and repeat if needed.  You must not drink alcohol, drive, or operate dangerous equipment when taking this medication., Disp: 2 tablet, Rfl: 0 .  memantine (NAMENDA) 10 MG tablet, Take 1 tablet (10 mg total) by mouth 2 (two) times daily., Disp: 180 tablet, Rfl: 1 .  phenytoin (DILANTIN) 100 MG ER  capsule, Take 2 capsules (200 mg total) by mouth at bedtime., Disp: 180 capsule, Rfl: 3  Exam: Current vital signs: BP (!) 170/62   Pulse 95   Ht 4\' 11"  (1.499 m)   Wt 39.9 kg (88 lb)   SpO2 95%   BMI 17.77 kg/m   Vital signs in last 24 hours: Pulse Rate:  [95] 95 (12/14 2155) BP: (170-211)/(62-65) 170/62 (12/14 2158) SpO2:  [95 %] 95 % (12/14 2155) Weight:  [39.9 kg (88 lb)] 39.9 kg (88 lb) (12/14 2149) General: Eyes open, unresponsive to voice or noxious stimulus.  Soon after started having twitching of the right face and right upper extremity with gaze deviation to the right. H ENT: Normocephalic atraumatic Lungs: Clear to auscultation Cardiovascular: S1-S2 heard, hypertensive at systolic blood pressure 616. Abdomen: Nondistended nontender Neurological exam Patient's unresponsive with eyes open and not responsive to voice or noxious stimulus. Unable to assess speech because of the above said reason. Cranial nerves: Pupils equal round reactive to light, right word forced gaze which resolved with 5 mg of IV Versed, unable to assess visual fields, right lower facial weakness Motor exam: Withdrew left upper and lower extremity noxious edematous.  No withdrawal in right upper and lower extremity to noxious stimulus.  No spontaneous movement of the right side initially but after receiving Versed and intubation, was moving the right side to noxious stim. Sensory exam: As above Coordination and gait cannot be ascertained.  NIHSS-28  Labs I have reviewed labs in epic and the results pertinent to this consultation are: Leukocytosis 19.6, hemoglobin 10.7, K5.6, bicarb 18, glucose 132, creatinine 1.6  CBC    Component Value Date/Time   WBC 19.6 (H) 11/22/2017 2140   RBC 3.94 11/22/2017 2140   HGB 12.2 11/22/2017 2150   HGB 10.7 (L) 06/04/2017 0840   HCT 36.0 11/22/2017 2150   HCT 33.4 (L) 06/04/2017 0840   PLT 206 11/22/2017 2140   PLT 154 06/04/2017 0840   MCV 94.2 11/22/2017  2140   MCV 98 (H) 06/04/2017 0840   MCH 29.4 11/22/2017 2140   MCHC 31.3 11/22/2017 2140   RDW 14.2 11/22/2017 2140   RDW 16.6 (H) 06/04/2017 0840   LYMPHSABS PENDING 11/22/2017 2140   LYMPHSABS 2.4 06/04/2017 0840   MONOABS PENDING 11/22/2017 2140   EOSABS PENDING 11/22/2017 2140   EOSABS 0.1 06/04/2017 0840   BASOSABS PENDING 11/22/2017 2140   BASOSABS 0.0 06/04/2017 0840   CMP     Component Value Date/Time   NA 139 11/22/2017 2150   NA 141 06/04/2017 0840   K 5.6 (H) 11/22/2017 2150   CL 119 (H) 11/22/2017 2150   CO2  18 (L) 06/04/2017 0840   GLUCOSE 132 (H) 11/22/2017 2150   BUN 36 (H) 11/22/2017 2150   BUN 20 06/04/2017 0840   CREATININE 1.60 (H) 11/22/2017 2150   CALCIUM 8.7 06/04/2017 0840   PROT 6.5 06/04/2017 0840   ALBUMIN 3.9 06/04/2017 0840   AST 7 06/04/2017 0840   ALT 8 06/04/2017 0840   ALKPHOS 111 06/04/2017 0840   BILITOT <0.2 06/04/2017 0840   GFRNONAA 31 (L) 06/04/2017 0840   GFRAA 35 (L) 06/04/2017 0840    Imaging I have reviewed the images obtained:  CT-scan of the brain -no acute changes.  No evidence of bleed.  Assessment:  66 year old with past medical history of severe dementia, COPD, TIA in the past, tobacco abuse, seizure disorder and insomnia brought in for evaluation of unresponsiveness and right-sided weakness. On further history, she had twitching of the right side of the body and rightward gaze at home after which she became unresponsive. She had another episode of seizure witnessed by me in the ER with right gaze deviation and twitching of the mouth and face on the right side and right upper extremity. Her seizure resolved with 5 mg's of Versed.  She was intubated for airway protection.  She received 1 g Keppra IV. Likely breakthrough seizure.  Not sure of compliance to home dose of Dilantin.  Impression: Breakthrough seizure/status epilepticus -evaluate for noncompliance versus underlying infection Acute respiratory failure in the  setting of seizure  Recommendations: -At this time I would recommend continuing the propofol drip - Keppra 500 twice daily - Check Dilantin level - Can start Dilantin at home dose only after drawing the Dilantin level. -Maintain seizure precautions -MRI when stable - Clinically, she is now moving her right side a little and left side strongly with no gaze deviation or preference.  Low likelihood of being in status epilepticus.  We will perform routine EEG in the morning.  Should her exam change overnight, will consider hooking her up to EEG for overnight. -Evaluate for underlying UTI or pneumonia  Neurology will follow with you.   --- Amie Portland, MD [facility] [phone] If 7pm to 7am, please call on call as listed on [facility].   CRITICAL CARE Performed by: Amie Portland   Total critical care time:50 minutes  Critical care time was exclusive of separately billable procedures and treating other patients.  Critical care was necessary to treat or prevent imminent or life-threatening deterioration.  Critical care was time spent personally by me on the following activities: development of treatment plan with patient and/or surrogate as well as nursing, discussions with consultants, evaluation of patient's response to treatment, examination of patient, obtaining history from patient or surrogate, ordering and performing treatments and interventions, ordering and review of laboratory studies, ordering and review of radiographic studies, pulse oximetry and re-evaluation of patient's condition.

## 2017-11-23 ENCOUNTER — Inpatient Hospital Stay (HOSPITAL_COMMUNITY): Payer: Medicare Other

## 2017-11-23 DIAGNOSIS — A419 Sepsis, unspecified organism: Secondary | ICD-10-CM | POA: Insufficient documentation

## 2017-11-23 DIAGNOSIS — E872 Acidosis, unspecified: Secondary | ICD-10-CM | POA: Insufficient documentation

## 2017-11-23 DIAGNOSIS — Z9911 Dependence on respirator [ventilator] status: Secondary | ICD-10-CM | POA: Insufficient documentation

## 2017-11-23 DIAGNOSIS — N390 Urinary tract infection, site not specified: Secondary | ICD-10-CM

## 2017-11-23 DIAGNOSIS — N179 Acute kidney failure, unspecified: Secondary | ICD-10-CM | POA: Insufficient documentation

## 2017-11-23 LAB — BASIC METABOLIC PANEL
ANION GAP: 7 (ref 5–15)
ANION GAP: 9 (ref 5–15)
BUN: 21 mg/dL — ABNORMAL HIGH (ref 6–20)
BUN: 22 mg/dL — ABNORMAL HIGH (ref 6–20)
CALCIUM: 7.9 mg/dL — AB (ref 8.9–10.3)
CALCIUM: 8 mg/dL — AB (ref 8.9–10.3)
CHLORIDE: 117 mmol/L — AB (ref 101–111)
CHLORIDE: 117 mmol/L — AB (ref 101–111)
CO2: 15 mmol/L — AB (ref 22–32)
CO2: 14 mmol/L — AB (ref 22–32)
Creatinine, Ser: 1.55 mg/dL — ABNORMAL HIGH (ref 0.44–1.00)
Creatinine, Ser: 1.48 mg/dL — ABNORMAL HIGH (ref 0.44–1.00)
GFR calc Af Amer: 39 mL/min — ABNORMAL LOW (ref >60)
GFR calc Af Amer: 41 mL/min — ABNORMAL LOW (ref >60)
GFR calc non Af Amer: 34 mL/min — ABNORMAL LOW (ref >60)
GFR calc non Af Amer: 36 mL/min — ABNORMAL LOW (ref >60)
GLUCOSE: 118 mg/dL — AB (ref 65–99)
GLUCOSE: 109 mg/dL — AB (ref 65–99)
POTASSIUM: 4.6 mmol/L (ref 3.5–5.1)
Potassium: 4.5 mmol/L (ref 3.5–5.1)
Sodium: 139 mmol/L (ref 135–145)
Sodium: 140 mmol/L (ref 135–145)

## 2017-11-23 LAB — GLUCOSE, CAPILLARY
GLUCOSE-CAPILLARY: 103 mg/dL — AB (ref 65–99)
GLUCOSE-CAPILLARY: 107 mg/dL — AB (ref 65–99)
GLUCOSE-CAPILLARY: 108 mg/dL — AB (ref 65–99)
GLUCOSE-CAPILLARY: 82 mg/dL (ref 65–99)
GLUCOSE-CAPILLARY: 91 mg/dL (ref 65–99)
Glucose-Capillary: 122 mg/dL — ABNORMAL HIGH (ref 65–99)

## 2017-11-23 LAB — URINALYSIS, ROUTINE W REFLEX MICROSCOPIC
BILIRUBIN URINE: NEGATIVE
Glucose, UA: NEGATIVE mg/dL
Ketones, ur: NEGATIVE mg/dL
NITRITE: POSITIVE — AB
PROTEIN: NEGATIVE mg/dL
Specific Gravity, Urine: 1.01 (ref 1.005–1.030)
pH: 6 (ref 5.0–8.0)

## 2017-11-23 LAB — TROPONIN I
TROPONIN I: 0.03 ng/mL — AB (ref <0.03)
TROPONIN I: 0.03 ng/mL — AB (ref <0.03)
Troponin I: 0.03 ng/mL (ref <0.03)

## 2017-11-23 LAB — COOXEMETRY PANEL
Carboxyhemoglobin: 0.7 % (ref 0.5–1.5)
Methemoglobin: 1.8 % — ABNORMAL HIGH (ref 0.0–1.5)
O2 SAT: 92.9 %
Total hemoglobin: 9.2 g/dL — ABNORMAL LOW (ref 12.0–16.0)

## 2017-11-23 LAB — CBC
HEMATOCRIT: 32 % — AB (ref 36.0–46.0)
HEMOGLOBIN: 9.8 g/dL — AB (ref 12.0–15.0)
MCH: 28.5 pg (ref 26.0–34.0)
MCHC: 30.6 g/dL (ref 30.0–36.0)
MCV: 93 fL (ref 78.0–100.0)
Platelets: 158 10*3/uL (ref 150–400)
RBC: 3.44 MIL/uL — ABNORMAL LOW (ref 3.87–5.11)
RDW: 14.4 % (ref 11.5–15.5)
WBC: 12.3 10*3/uL — ABNORMAL HIGH (ref 4.0–10.5)

## 2017-11-23 LAB — POCT I-STAT 3, ART BLOOD GAS (G3+)
Acid-base deficit: 13 mmol/L — ABNORMAL HIGH (ref 0.0–2.0)
BICARBONATE: 13.8 mmol/L — AB (ref 20.0–28.0)
O2 Saturation: 99 %
PCO2 ART: 33.5 mmHg (ref 32.0–48.0)
PH ART: 7.222 — AB (ref 7.350–7.450)
TCO2: 15 mmol/L — ABNORMAL LOW (ref 22–32)
pO2, Arterial: 192 mmHg — ABNORMAL HIGH (ref 83.0–108.0)

## 2017-11-23 LAB — HEPATIC FUNCTION PANEL
ALT: 11 U/L — ABNORMAL LOW (ref 14–54)
AST: 18 U/L (ref 15–41)
Albumin: 3 g/dL — ABNORMAL LOW (ref 3.5–5.0)
Alkaline Phosphatase: 132 U/L — ABNORMAL HIGH (ref 38–126)
Bilirubin, Direct: <0.1 mg/dL — ABNORMAL LOW (ref 0.1–0.5)
Total Bilirubin: 0.3 mg/dL (ref 0.3–1.2)
Total Protein: 5.9 g/dL — ABNORMAL LOW (ref 6.5–8.1)

## 2017-11-23 LAB — LACTIC ACID, PLASMA: Lactic Acid, Venous: 1.8 mmol/L (ref 0.5–1.9)

## 2017-11-23 LAB — PROCALCITONIN: Procalcitonin: <0.1 ng/mL

## 2017-11-23 LAB — RAPID URINE DRUG SCREEN, HOSP PERFORMED
AMPHETAMINES: NOT DETECTED
Barbiturates: NOT DETECTED
Benzodiazepines: POSITIVE — AB
COCAINE: NOT DETECTED
OPIATES: NOT DETECTED
TETRAHYDROCANNABINOL: NOT DETECTED

## 2017-11-23 LAB — MRSA PCR SCREENING: MRSA by PCR: NEGATIVE

## 2017-11-23 LAB — PHOSPHORUS: Phosphorus: 3.3 mg/dL (ref 2.5–4.6)

## 2017-11-23 LAB — MAGNESIUM: Magnesium: 1.9 mg/dL (ref 1.7–2.4)

## 2017-11-23 LAB — TRIGLYCERIDES: Triglycerides: 224 mg/dL — ABNORMAL HIGH (ref <150)

## 2017-11-23 MED ORDER — LORAZEPAM 2 MG/ML IJ SOLN
2.0000 mg | Freq: Once | INTRAMUSCULAR | Status: AC
Start: 2017-11-23 — End: 2017-11-23
  Administered 2017-11-23: 2 mg via INTRAVENOUS
  Filled 2017-11-23: qty 1

## 2017-11-23 MED ORDER — PHENYTOIN 125 MG/5ML PO SUSP
75.0000 mg | Freq: Three times a day (TID) | ORAL | Status: DC
  Administered 2017-11-23 – 2017-11-27 (×11): 75 mg
  Filled 2017-11-23 (×14): qty 4

## 2017-11-23 MED ORDER — PIPERACILLIN-TAZOBACTAM 3.375 G IVPB
3.3750 g | Freq: Three times a day (TID) | INTRAVENOUS | Status: DC
  Administered 2017-11-23 – 2017-11-27 (×13): 3.375 g via INTRAVENOUS
  Filled 2017-11-23 (×16): qty 50

## 2017-11-23 MED ORDER — STERILE WATER FOR INJECTION IV SOLN
INTRAVENOUS | Status: DC
  Administered 2017-11-23: 04:00:00 via INTRAVENOUS
  Filled 2017-11-23: qty 850

## 2017-11-23 MED ORDER — CHLORHEXIDINE GLUCONATE 0.12% ORAL RINSE (MEDLINE KIT)
15.0000 mL | Freq: Two times a day (BID) | OROMUCOSAL | Status: DC
  Administered 2017-11-23 – 2017-11-24 (×4): 15 mL via OROMUCOSAL

## 2017-11-23 MED ORDER — HYDRALAZINE HCL 20 MG/ML IJ SOLN: 5.0000 mg | Freq: Four times a day (QID) | INTRAMUSCULAR | Status: DC | PRN

## 2017-11-23 MED ORDER — ATORVASTATIN CALCIUM 10 MG PO TABS
10.0000 mg | ORAL_TABLET | Freq: Every day | ORAL | Status: DC
  Administered 2017-11-23: 10 mg via ORAL
  Filled 2017-11-23 (×2): qty 1

## 2017-11-23 MED ORDER — SODIUM CHLORIDE 0.9 % IV SOLN
250.0000 mL | INTRAVENOUS | Status: DC | PRN
  Administered 2017-11-23: 250 mL via INTRAVENOUS

## 2017-11-23 MED ORDER — ORAL CARE MOUTH RINSE
15.0000 mL | Freq: Four times a day (QID) | OROMUCOSAL | Status: DC
  Administered 2017-11-23 – 2017-11-27 (×17): 15 mL via OROMUCOSAL

## 2017-11-23 MED ORDER — MIDAZOLAM HCL 2 MG/2ML IJ SOLN
2.0000 mg | INTRAMUSCULAR | Status: DC | PRN
  Administered 2017-11-23 (×4): 2 mg via INTRAVENOUS
  Filled 2017-11-23 (×6): qty 2

## 2017-11-23 MED ORDER — SODIUM CHLORIDE 0.9 % IV SOLN
INTRAVENOUS | Status: DC
  Administered 2017-11-23: 01:00:00 via INTRAVENOUS

## 2017-11-23 MED ORDER — PHENYTOIN 125 MG/5ML PO SUSP: 85.0000 mg | Freq: Three times a day (TID) | ORAL | Status: DC

## 2017-11-23 MED ORDER — HYDRALAZINE HCL 20 MG/ML IJ SOLN
5.0000 mg | Freq: Four times a day (QID) | INTRAMUSCULAR | Status: DC | PRN
  Administered 2017-11-23 – 2017-11-26 (×2): 5 mg via INTRAVENOUS
  Filled 2017-11-23 (×2): qty 1

## 2017-11-23 MED ORDER — LACTATED RINGERS IV SOLN
INTRAVENOUS | Status: DC
  Administered 2017-11-23: 01:00:00 via INTRAVENOUS

## 2017-11-23 MED ORDER — PHENYTOIN 125 MG/5ML PO SUSP
67.0000 mg | Freq: Three times a day (TID) | ORAL | Status: DC
  Administered 2017-11-23 (×2): 67 mg
  Filled 2017-11-23 (×3): qty 4

## 2017-11-23 MED ORDER — CLOPIDOGREL BISULFATE 75 MG PO TABS
75.0000 mg | ORAL_TABLET | Freq: Every day | ORAL | Status: DC
  Administered 2017-11-23 – 2017-11-25 (×2): 75 mg
  Filled 2017-11-23 (×3): qty 1

## 2017-11-23 NOTE — Progress Notes (Signed)
I pulled two vials of versed for MRI trip and SWOT took patient down to MRI. When swot brought patient back she used one vial and handed Shea Stakes the new nurse who had her back 1 vial. When he went to return the versed vial the pyxis only showed 1 vial pulled. None of my other patients have versed PRN so I know for a fact that it was for [name]. All versed was used or return. Bartholomew Crews, RN 11/23/2017 5:46 PM

## 2017-11-23 NOTE — Progress Notes (Addendum)
Chart reviewed, patient seen and examined.  ICU & vent day 2   Subjective:  Chart reviewed, patient seen and examined. Meds reviewed.    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     Current Facility-Administered Medications:  .  0.9 %  sodium chloride infusion, 250 mL, Intravenous, PRN, Dacari Beckstrand P, MD .  atorvastatin (LIPITOR) tablet 10 mg, 10 mg, Oral, Daily, Hayden Pedro M, NP, 10 mg at 11/23/17 0908 .  chlorhexidine gluconate (MEDLINE KIT) (PERIDEX) 0.12 % solution 15 mL, 15 mL, Mouth Rinse, BID, Omar Person, NP, 15 mL at 11/23/17 0737 .  clopidogrel (PLAVIX) tablet 75 mg, 75 mg, Per Tube, Daily, Omar Person, NP, 75 mg at 11/23/17 0908 .  fentaNYL (SUBLIMAZE) injection 50 mcg, 50 mcg, Intravenous, Q2H PRN, Omar Person, NP .  heparin injection 5,000 Units, 5,000 Units, Subcutaneous, Q8H, Eubanks, Katalina M, NP, 5,000 Units at 11/23/17 0533 .   hydrALAZINE (APRESOLINE) injection 5 mg, 5 mg, Intravenous, Q6H PRN, Omar Person, NP .  levETIRAcetam (KEPPRA) 500 mg in sodium chloride 0.9 % 100 mL IVPB, 500 mg, Intravenous, Q12H, Omar Person, NP, Stopped at 11/23/17 [phone] .  LORazepam (ATIVAN) injection 2 mg, 2 mg, Intravenous, Once, Maygan Koeller P, MD .  MEDLINE mouth rinse, 15 mL, Mouth Rinse, QID, Omar Person, NP, 15 mL at 11/23/17 0332 .  pantoprazole (PROTONIX) injection 40 mg, 40 mg, Intravenous, QHS, Omar Person, NP, 40 mg at 11/23/17 0050 .  phenytoin (DILANTIN) 125 MG/5ML suspension 75 mg, 75 mg, Per Tube, Q8H, Kirkpatrick, Vida Roller, MD .  piperacillin-tazobactam (ZOSYN) IVPB 3.375 g, 3.375 g, Intravenous, Q8H, Janett Billow, RPH, Last Rate: 12.5 mL/hr at 11/23/17 0927, 3.375 g at 11/23/17 0927 .  propofol (DIPRIVAN) 1000 MG/100ML infusion, 5-80 mcg/kg/min, Intravenous, Titrated, Nanavati, Ankit, MD, Stopped at 11/23/17 1048    Subjective:   No further  Seizure. Apneic on SBT today. Clear urine.    Physical Exam: Blood pressure (!) 158/91, pulse 88, temperature 99.1 F (37.3 C), temperature source Oral, resp. rate 16, height _0  (1.499 m), weight 82 lb 14.3 oz (37.6 kg), SpO2 100 %.  General appearance Orally intubated,  NAD   HEENT No pallor/icterus No swelling in the neck Pupils equal  No JVD  Cardiovascular S1 and S2 regular no Murmur/gallop Respiratory Chest expansion equal Breath sounds equal and clear No rhonchi or rales Abdomen   Soft,Nontender, nondistended No organomegaly,No mass Bowel sounds+ GU: Has foley Extremities No swelling,No tenderness No clubbing Neurological Alert, awake No facial asymmetry Tone normal Grossly intact   Ventilator  Fi02-  45%, Peep- 5, Rate- 20 , Vt- 337m Pip- 20s  No AutoPEEP  Results for EFREDRICKA, KOHRS(MRN [id number] as of 11/23/2017 10:32  Ref. Range 11/22/2017 21:41  Appearance Latest Ref Range: CLEAR  HAZY (A)   Bilirubin Urine Latest Ref Range: NEGATIVE  NEGATIVE  Color, Urine Latest Ref Range: YELLOW  YELLOW  Glucose Latest Ref Range: NEGATIVE mg/dL NEGATIVE  Hgb urine dipstick Latest Ref Range: NEGATIVE  SMALL (A)  Ketones, ur Latest Ref Range: NEGATIVE mg/dL NEGATIVE  Leukocytes, UA Latest Ref Range: NEGATIVE  LARGE (A)  Nitrite Latest Ref Range: NEGATIVE  POSITIVE (A)  pH Latest Ref Range: 5.0 - 8.0  6.0  Protein Latest Ref Range: NEGATIVE mg/dL NEGATIVE  Specific Gravity, Urine Latest Ref Range: 1.005 - 1.030  1.010  Bacteria, UA Latest Ref Range: NONE SEEN  MANY (A)  Mucus Unknown PRESENT  RBC / HPF Latest Ref Range: 0 - 5 RBC/hpf 0-5  Squamous Epithelial / LPF Latest Ref Range: NONE SEEN  0-5 (A)  WBC, UA Latest Ref Range: 0 - 5 WBC/hpf 6-30   Results for ELILINOE, ACKLIN(MRN [id number] as of 11/23/2017 10:32  Ref. Range 11/22/2017 21:40 11/22/2017 21:41  Alcohol, Ethyl (B) Latest Ref Range: <10 mg/dL <10   Amphetamines Latest Ref Range: NONE DETECTED   NONE DETECTED  Barbiturates Latest Ref Range: NONE DETECTED   NONE DETECTED  Benzodiazepines Latest Ref Range: NONE DETECTED   POSITIVE (A)  Opiates Latest Ref Range: NONE DETECTED   NONE DETECTED  COCAINE Latest Ref Range: NONE DETECTED   NONE DETECTED  Tetrahydrocannabinol Latest Ref Range: NONE DETECTED   NONE DETECTED   Results for ELASHALA, LASER(MRN [id number] as of 11/23/2017 10:32  Ref. Range 11/23/2017 04:18  pH, Arterial Latest Ref Range: 7.350 - 7.450  7.222 (L)  pCO2 arterial Latest Ref Range: 32.0 - 48.0 mmHg 33.5  pO2, Arterial Latest Ref Range: 83.0 - 108.0 mmHg 192.0 (H)  TCO2 Latest Ref Range: 22 - 32 mmol/L 15 (L)   Results for EALICIANNA, LITCHFORD(MRN [id number] as of 11/23/2017 10:32  Ref. Range 11/23/2017 04:29  WBC Latest Ref Range: 4.0 - 10.5 K/uL 12.3 (H)  RBC Latest Ref Range: 3.87 - 5.11 MIL/uL 3.44 (L)  Hemoglobin Latest Ref Range: 12.0 - 15.0 g/dL 9.8 (L)  HCT Latest Ref Range: 36.0 - 46.0 % 32.0 (L)  MCV  Latest Ref Range: 78.0 - 100.0 fL 93.0  MCH Latest Ref Range: 26.0 - 34.0 pg 28.5  MCHC Latest Ref Range: 30.0 - 36.0 g/dL 30.6  RDW Latest Ref Range: 11.5 - 15.5 % 14.4  Platelets Latest Ref Range: 150 - 400 K/uL 158   Results for EFYNLEE, ROWLANDS(MRN [id number] as of 11/23/2017 10:32  Ref. Range 04/02/2016 09:56 06/04/2017 08:40 11/22/2017 21:40 11/22/2017 21:48 11/22/2017 21:50 11/22/2017 22:58  Phenytoin Lvl Latest Ref Range: 10.0 - 20.0 ug/mL      8.8 (L)  Phenytoin (Dilantin), Serum Latest Ref Range: 10.0 - 20.0 ug/mL 5.9 (L) 16.7         Results for ERAIN, WILHIDE(MRN [id number] as of 11/23/2017 10:32  Ref. Range 11/23/2017 01:19 11/23/2017 04:18 11/23/2017 04:29  Sodium Latest Ref Range: 135 - 145 mmol/L 139  140  Potassium Latest Ref Range: 3.5 - 5.1 mmol/L 4.5  4.6  Chloride Latest Ref Range: 101 - 111 mmol/L 117 (H)  117 (H)  CO2 Latest Ref Range: 22 - 32 mmol/L 15 (L)  14 (L)  Glucose Latest Ref Range: 65 - 99 mg/dL 118 (H)  109 (H)  BUN Latest Ref Range: 6 - 20 mg/dL 21 (H)  22 (H)  Creatinine Latest Ref Range: 0.44 - 1.00 mg/dL 1.55 (H)  1.48 (H)  Calcium Latest Ref Range: 8.9 - 10.3 mg/dL 7.9 (L)  8.0 (L)  Anion gap Latest Ref Range: 5 - _0 Phosphorus Latest Ref Range: 2.5 - 4.6 mg/dL   3.3  Magnesium Latest Ref Range: 1.7 - 2.4 mg/dL   1.9  Alkaline Phosphatase Latest Ref Range: 38 - 126 U/L   132 (H)  Albumin Latest Ref Range: 3.5 - 5.0 g/dL   3.0 (L)  AST Latest Ref Range: 15 - 41 U/L   18  ALT Latest Ref Range: 14 - 54 U/L   11 (L)  Total Protein Latest Ref Range: 6.5 - 8.1 g/dL   5.9 (L)  Bilirubin, Direct Latest Ref Range: 0.1 - 0.5 mg/dL   <0.1 (L)  Indirect Bilirubin Latest Ref Range: 0.3 - 0.9 mg/dL   NOT CALCULATED  Total Bilirubin Latest Ref Range: 0.3 - 1.2 mg/dL   0.3  GFR, Est Non African American Latest Ref Range: >60 mL/min 34 (L)  36 (L)  GFR, Est African American Latest Ref Range: >60 mL/min 39 (L)  41 (L)       Results for LENNYN, GANGE (MRN  [id number]) as of 11/23/2017 10:32  Ref. Range 11/23/2017 04:29  Troponin I Latest Ref Range: <0.03 ng/mL 0.03 (HH)          11/22/17- EKG Sinus, No acute changes.  Echo- 2015- normal LV fxn.   Xray Chest 11/23/17 No infiltrate, ETT tip  Above carina. g tube tip in stomach   CT scan 11/22/17  1. No acute hemorrhage. 2. ASPECTS is 10. 3. Findings of chronic microvascular ischemia and mild volume loss.    Assessment and Plan:  Acute   Hypercapnic respiratory failure ,COPD,Emphysema Dementia Seizure disorder Recurrent UTI Metabolic acidosis Mild AKI/CKD     Neurology Antiepileptics per Neurology On Keppra, [place] off propofol Ativan for anxiety MRI brain   Cardiology HD stable   Pulmonary Wean vent as tolerated Bronchodilators  As  needed      GI /nutrition Feeds when extubated  Renal, fluids, electrolytes D/c Bicarb drip Reduce IV fluids Monitor lytes Change Foley Cath by GU after weekend. Son said its due for change  By now. 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  Hematology Monitor blood counts Dilutional anemia likely-    Endocrinology Monitor sugars   Infectious diseases F/u c/s data On zosyn  Lines and devices Foley, ETT, G tube   CODE STATUS   Family/Patient-Counseled and updated.   Disposition-ICU   Thank you for letting me participate in the care of your patient.  The patient is critically ill with multiple organ systems failure and requires high complexity decision making for assessment and support, frequent evaluation and titration of therapies, application of advanced monitoring technologies and extensive interpretation of multiple databases like  review of chart, labs, imaging, coordinating care with other physicians and healthcare team members.. ?  Critical Care Time devoted to patient care services described in this note is 45 Minutes.?This time reflects time of care of this signee. This critical care time  does not reflect procedure time, or teaching time or supervisory time of NP, but could involve care discussion time.  Note subject to typographical and grammatical errors;  Any formal  questions or concerns about the content, text, or information contained within the body of this dictation  should be directly addressed to the physician  for  clarification.  Evans Lance, MD [facility] Pager: [phone]

## 2017-11-23 NOTE — Progress Notes (Signed)
Pharmacy Antibiotic Note  [name] is a 66 y.o. female admitted on 11/22/2017 with AMS. Pharmacy has been consulted for Zosyn dosing for suspected intra-abdominal infection.  WBC 19.6, no temp. SCr 1.6 for estimated CrCl ~ 20-25 mL/min.    Plan: Zosyn 3.375g IV q8hr Monitor renal function, clinical picture, and culture data F/u length of therapy   Height: 4\' 11"  (197.5 cm) Weight: 88 lb (39.9 kg) IBW/kg (Calculated) : 43.2  No data recorded.  Recent Labs  Lab 11/22/17 2140 11/22/17 2150  WBC 19.6*  --   CREATININE 1.66* 1.60*    Estimated Creatinine Clearance: 21.8 mL/min (A) (by C-G formula based on SCr of 1.6 mg/dL (H)).    Allergies  Allergen Reactions  . Codeine Nausea And Vomiting    REACTION: intolerance  . Latex     itching   Antimicrobials this admission: 12/15 Zosyn >>   Lavonda Jumbo, PharmD Clinical Pharmacist 11/23/17 12:48 AM

## 2017-11-23 NOTE — Procedures (Signed)
History: 66 year old female with breakthrough seizures  Sedation: Propofol/Versed  Technique: This is a 21 channel routine scalp EEG performed at the bedside with bipolar and monopolar montages arranged in accordance to the international 10/20 system of electrode placement. One channel was dedicated to EKG recording.    Background: There is a posterior dominant rhythm of 8-9 Hz.  In addition there is generalized irregular theta activity as well as excessive beta activity.  In addition there is some focal irregular delta maximal in the left temporal region that is seen at times.  Sleep is recorded with symmetric appearing structures.  Photic stimulation: Physiologic driving is not performed  EEG Abnormalities: 1) focal left frontotemporal irregular delta activity 2) generalized irregular slow activity  Clinical Interpretation: This EEG is consistent with a focal left frontotemporal cerebral dysfunction.  Though nonspecific, this can be a postictal finding among other causes.   There was no seizure or seizure predisposition recorded on this study. Please note that a normal EEG does not preclude the possibility of epilepsy.   Roland Rack, MD [facility] [phone]  If 7pm- 7am, please page neurology on call as listed in [place].

## 2017-11-23 NOTE — Progress Notes (Signed)
CRITICAL VALUE ALERT  Critical Value:  Troponin 0.03  Date & Time Notied:  11/23/17 No notification  Provider Notified: Yes  Orders Received/Actions taken: None at this time

## 2017-11-23 NOTE — Progress Notes (Signed)
EEG Completed; Results Pending  

## 2017-11-23 NOTE — Progress Notes (Signed)
RT note: RT transported patient to MRI and back. Vital signs stable through out.

## 2017-11-23 NOTE — Procedures (Signed)
Central Venous Catheter Insertion Procedure Note [name] [id number] [birth year]  Procedure: Insertion of Central Venous Catheter Indications: Assessment of intravascular volume, Drug and/or fluid administration and Frequent blood sampling  Procedure Details Consent: Unable to obtain consent because of emergent medical necessity. Time Out: Verified patient identification, verified procedure, site/side was marked, verified correct patient position, special equipment/implants available, medications/allergies/relevent history reviewed, required imaging and test results available.  Performed  Maximum sterile technique was used including antiseptics, cap, gloves, gown, hand hygiene, mask and sheet. Skin prep: Chlorhexidine; local anesthetic administered A antimicrobial bonded/coated triple lumen catheter was placed in the right internal jugular vein using the Seldinger technique.  Evaluation Blood flow good Complications: No apparent complications Patient did tolerate procedure well. Chest X-ray ordered to verify placement.  CXR: pending.  Hayden Pedro, AGACNP-BC [place] [facility]  Pgr: [phone]  PCCM Pgr: [phone]

## 2017-11-23 NOTE — Progress Notes (Signed)
Initial Nutrition Assessment  DOCUMENTATION CODES:   Severe malnutrition in context of chronic illness, Underweight  INTERVENTION:    If TF started, rec initiating Vital AF 1.2 at 10 ml/hr and advance by 10 ml every 8 hours to goal rate of 30 ml/hr with Prostat 30 ml daily  TF & Propofol provides total of 1281 kcals, 69 gm protein, 584 ml of free water daily   NUTRITION DIAGNOSIS:   Severe Malnutrition related to chronic illness(Alzheimer's dementia, COPD) as evidenced by severe muscle depletion, severe fat depletion  GOAL:   Patient will meet greater than or equal to 90% of their needs  MONITOR:   Vent status, Labs, Weight trends, Skin, I & O's  REASON FOR ASSESSMENT:   Ventilator  ASSESSMENT:   66 yo Female with PMH of alzheimer's, COPD, HTN, 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  Patient is currently intubated on ventilator support MV: 9 L/min Temp (24hrs), Avg:99.1 F (37.3 C), Min:98.6 F (37 C), Max:99.6 F (37.6 C)  Propofol: 12 ml/hr >> 317 fat kcals  RD spoke with pt's son at bedside. Pt undergoing EEG. Son reports pt typically drinks an Ensure supplement for breakfast. She then snacks during the day and finally eats a hot meal for supper.  Son also reveals pt's weight has been stable. Labs and medications reviewed. TG 224 (H). CBG's [id number].  Pt at risk for refeeding syndrome if nutrition support is initiated.   NUTRITION - FOCUSED PHYSICAL EXAM:    Most Recent Value  Orbital Region  Unable to assess  Upper Arm Region  Severe depletion  Thoracic and Lumbar Region  Unable to assess  Buccal Region  Moderate depletion  Temple Region  Moderate depletion  Clavicle Bone Region  Severe depletion  Clavicle and Acromion Bone Region  Severe depletion  Scapular Bone Region  Unable to  assess  Dorsal Hand  Unable to assess  Patellar Region  Severe depletion  Anterior Thigh Region  Severe depletion  Posterior Calf Region  Severe depletion  Edema (RD Assessment)  None     Diet Order:  Diet NPO time specified  EDUCATION NEEDS:   No education needs have been identified at this time  Skin:  Skin Assessment: Reviewed RN Assessment  Last BM:  PTA   Intake/Output Summary (Last 24 hours) at 11/23/2017 1518 Last data filed at 11/23/2017 1400 Gross per 24 hour  Intake 1841.29 ml  Output 905 ml  Net 936.29 ml   Height:   Ht Readings from Last 1 Encounters:  11/23/17 4\' 11"  (1.499 m)   Weight:   Wt Readings from Last 1 Encounters:  11/23/17 82 lb 14.3 oz (37.6 kg)   Ideal Body Weight:  44.6 kg  BMI:  Body mass index is 16.74 kg/m.  Estimated Nutritional Needs:   Kcal:  1211  Protein:  60-75 gm  Fluid:  per MD  Arthur Holms, RD, LDN Pager #: [phone] After-Hours Pager #: [phone]

## 2017-11-23 NOTE — Progress Notes (Signed)
CRITICAL VALUE ALERT  Critical Value:  Trop-0.03  Date & Time Notied:  11/23/2017 5:51 AM  Provider Notified: Dr. Jimmy Footman  Orders Received/Actions taken: none at this time

## 2017-11-23 NOTE — Progress Notes (Signed)
Subjective: Improved since admission  Exam: Vitals:   11/23/17 0823 11/23/17 0830  BP: (!) 166/79 (!) 158/72  Pulse:  93  Resp:  20  Temp:    SpO2:  100%   Gen: In bed, NAD Resp: non-labored breathing, no acute distress Abd: soft, nt  Neuro: MS: Awake, follows commands readily,  VV:ZSMOL, VF Motor: MAEW, follows commands x 4.  Sensory:intact to LT  Pertinent Labs: Cr 1.48 Na 140 UA - nitrite positive UTI  Impression: 66 yo F with dementia presenting with breakthrough seizures in the setting of UTI. Her dilantin level was a little low, will increase slightly.   Recommendations: 1) Increase dilantin to 230mg /day, div TID for now. 230mg  qhs once able to take PO.  2) will follow.   Roland Rack, MD [facility] [phone]  If 7pm- 7am, please page neurology on call as listed in [place].

## 2017-11-24 ENCOUNTER — Inpatient Hospital Stay (HOSPITAL_COMMUNITY): Payer: Medicare Other

## 2017-11-24 LAB — GLUCOSE, CAPILLARY
GLUCOSE-CAPILLARY: 88 mg/dL (ref 65–99)
GLUCOSE-CAPILLARY: 151 mg/dL — AB (ref 65–99)
GLUCOSE-CAPILLARY: 70 mg/dL (ref 65–99)
Glucose-Capillary: 73 mg/dL (ref 65–99)
Glucose-Capillary: 64 mg/dL — ABNORMAL LOW (ref 65–99)
Glucose-Capillary: 73 mg/dL (ref 65–99)

## 2017-11-24 LAB — URINE CULTURE

## 2017-11-24 LAB — BASIC METABOLIC PANEL
Anion gap: 7 (ref 5–15)
BUN: 20 mg/dL (ref 6–20)
CALCIUM: 7.9 mg/dL — AB (ref 8.9–10.3)
CHLORIDE: 114 mmol/L — AB (ref 101–111)
CO2: 17 mmol/L — AB (ref 22–32)
CREATININE: 1.4 mg/dL — AB (ref 0.44–1.00)
GFR calc non Af Amer: 38 mL/min — ABNORMAL LOW (ref >60)
GFR, EST AFRICAN AMERICAN: 44 mL/min — AB (ref >60)
GLUCOSE: 91 mg/dL (ref 65–99)
Potassium: 3.5 mmol/L (ref 3.5–5.1)
Sodium: 138 mmol/L (ref 135–145)

## 2017-11-24 LAB — CBC
HCT: 27.2 % — ABNORMAL LOW (ref 36.0–46.0)
Hemoglobin: 8.7 g/dL — ABNORMAL LOW (ref 12.0–15.0)
MCH: 29.1 pg (ref 26.0–34.0)
MCHC: 32 g/dL (ref 30.0–36.0)
MCV: 91 fL (ref 78.0–100.0)
PLATELETS: 138 10*3/uL — AB (ref 150–400)
RBC: 2.99 MIL/uL — AB (ref 3.87–5.11)
RDW: 14.2 % (ref 11.5–15.5)
WBC: 9.2 10*3/uL (ref 4.0–10.5)

## 2017-11-24 LAB — PROCALCITONIN: Procalcitonin: <0.1 ng/mL

## 2017-11-24 MED ORDER — CHLORHEXIDINE GLUCONATE 0.12% ORAL RINSE (MEDLINE KIT)
15.0000 mL | Freq: Two times a day (BID) | OROMUCOSAL | Status: DC
  Administered 2017-11-24 – 2017-11-27 (×5): 15 mL via OROMUCOSAL

## 2017-11-24 MED ORDER — ATORVASTATIN CALCIUM 10 MG PO TABS
10.0000 mg | ORAL_TABLET | Freq: Every day | ORAL | Status: DC
  Administered 2017-11-24 – 2017-11-26 (×3): 10 mg
  Filled 2017-11-24 (×3): qty 1

## 2017-11-24 MED ORDER — ORAL CARE MOUTH RINSE
15.0000 mL | OROMUCOSAL | Status: DC
  Administered 2017-11-24 – 2017-11-25 (×4): 15 mL via OROMUCOSAL

## 2017-11-24 MED ORDER — DEXTROSE 50 % IV SOLN
INTRAVENOUS | Status: AC
  Administered 2017-11-24: 25 mL via INTRAVENOUS
  Filled 2017-11-24: qty 50

## 2017-11-24 MED ORDER — POTASSIUM CHLORIDE CRYS ER 20 MEQ PO TBCR: 20.0000 meq | EXTENDED_RELEASE_TABLET | Freq: Once | ORAL | Status: DC

## 2017-11-24 MED ORDER — POTASSIUM CHLORIDE 20 MEQ/15ML (10%) PO SOLN
20.0000 meq | Freq: Once | ORAL | Status: AC
  Administered 2017-11-24: 20 meq
  Filled 2017-11-24: qty 15

## 2017-11-24 MED ORDER — "THROMBI-PAD 3""X3"" EX PADS"
1.0000 | MEDICATED_PAD | CUTANEOUS | Status: DC | PRN
  Filled 2017-11-24: qty 1

## 2017-11-24 MED ORDER — DEXTROSE 50 % IV SOLN
25.0000 mL | Freq: Once | INTRAVENOUS | Status: DC
  Administered 2017-11-24: 25 mL via INTRAVENOUS
  Filled 2017-11-24: qty 50

## 2017-11-24 MED ORDER — "THROMBI-PAD 3""X3"" EX PADS"
1.0000 | MEDICATED_PAD | Freq: Once | CUTANEOUS | Status: AC
  Administered 2017-11-24: 1 via TOPICAL
  Filled 2017-11-24: qty 1

## 2017-11-24 NOTE — Progress Notes (Signed)
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   HISTORY OF PRESENT ILLNESS: 66 year old female with PMH of Alzheimer's Dementia, COPD, HLD, HTN, Insomnia, Recurrent UTI due to Self-Cath given Artifical Bladder   PAST MEDICAL HISTORY : Nunzio Cory a past medical history of Alzheimer's dementia, Anemia, COPD (chronic obstructive pulmonary disease) ([place]), History of blood transfusion, History of MRSA infection, Hyperlipidemia, Hypertension, Insomnia, Renal disorder, Subacute delirium (10/14/2013), Toxic encephalopathy, and UTI (lower urinary tract infection).  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      ICU & vent day 3  New Events  Had RIJ cvc placed overnite; oozing around neck stopped. Sedated. No further seizure reported.   Current Facility-Administered Medications:  .  0.9 %  sodium chloride infusion, 250 mL, Intravenous, PRN, Myleah Cavendish P, MD, Last Rate: 50 mL/hr at 11/24/17 1200, 250 mL at 11/24/17 1200 .  [START ON 11/25/2017] atorvastatin (LIPITOR) tablet 10 mg, 10 mg, Per Tube, Daily, Sood, Vineet, MD .  chlorhexidine gluconate (MEDLINE KIT) (PERIDEX) 0.12 % solution 15 mL, 15 mL, Mouth Rinse, BID, Omar Person, NP, 15 mL at 11/24/17 0834 .  clopidogrel (PLAVIX) tablet 75 mg, 75 mg, Per Tube, Daily, Omar Person, NP, 75 mg at 11/23/17 0908 .  dextrose 50 % solution 25 mL, 25 mL, Intravenous, Once, Sood, Vineet, MD .  dextrose 50 % solution, , , ,   .  fentaNYL (SUBLIMAZE) injection 50 mcg, 50 mcg, Intravenous, Q2H PRN, Omar Person, NP, 50 mcg at 11/24/17 0910 .  heparin injection 5,000 Units, 5,000 Units, Subcutaneous, Q8H, Eubanks, Katalina M, NP, 5,000 Units at 11/24/17 0519 .  hydrALAZINE (APRESOLINE) injection 5 mg, 5 mg, Intravenous, Q6H PRN, Dionne Rossa P, MD, 5 mg at 11/23/17 1812 .  levETIRAcetam (KEPPRA) 500 mg in sodium chloride 0.9 % 100 mL IVPB, 500 mg, Intravenous, Q12H, Omar Person, NP, Last Rate: 420 mL/hr at 11/24/17 1050, 500 mg at 11/24/17 1050 .  MEDLINE mouth rinse, 15 mL, Mouth Rinse, QID, Eubanks, Katalina M, NP, 15 mL at 11/24/17 0400 .  midazolam (VERSED) injection 2 mg, 2 mg, Intravenous, Q2H PRN, Alegria Dominique P, MD, 2 mg at 11/23/17 2235 .  pantoprazole (PROTONIX) injection 40 mg, 40 mg, Intravenous, QHS, Eubanks, Katalina M, NP, 40 mg at 11/24/17 0000 .  phenytoin (DILANTIN) 125 MG/5ML suspension 75 mg, 75 mg, Per Tube, Q8H, Greta Doom, MD, 75 mg at 11/24/17 0519 .  piperacillin-tazobactam (ZOSYN) IVPB 3.375 g, 3.375 g, Intravenous, Q8H, Janett Billow, RPH, Last Rate: 12.5 mL/hr at 11/24/17 1030, 3.375 g at 11/24/17 1030 .  propofol (DIPRIVAN) 1000 MG/100ML infusion, 5-80 mcg/kg/min, Intravenous, Titrated, Nanavati, Ankit, MD, Last Rate: 12 mL/hr at 11/24/17 1000, 50 mcg/kg/min at 11/24/17 1000    PHYSICAL EXAM:     Blood pressure (!) 150/62, pulse 72, temperature (!) 97.4 F (36.3 C), temperature source Oral, resp. rate 15, height _0  (1.499 m), weight 90 lb 2.7 oz (40.9 kg), SpO2 100 %.  Eyes: Pupils equal,No pallor Neck: No  neck swelling;  RIJ cvc site intact. CVS:  -s1 s2 regular Resp:Breath sounds equal; no rales No rhonchi Abdomen:abd-soft, non tender,    BS+ Extremities:No edema Neuro:sedated,tone  Normal Skin: no rash    Ventilator  Fi02- 45%, Peep- 5, Rate- 20 , Vt- 354m Pip- 20s  No AutoPEEP  Pip- 20s   No AutoPEEP     LABS:  Results for  EMARIYANA, FULOP(MRN [id number] as of 11/24/2017 13:23  Ref. Range 11/23/2017 16:46 11/24/2017 08:31  Troponin I Latest Ref Range: <0.03 ng/mL 0.03 (HH)   Procalcitonin Latest Units: ng/mL  <0.10   Results for EPURITY, IRMEN(MRN [id number] as of 11/24/2017 13:23  Ref. Range 11/24/2017 08:31  Sodium Latest Ref Range: 135 - 145 mmol/L 138  Potassium Latest Ref Range: 3.5 - 5.1 mmol/L 3.5  Chloride Latest Ref Range: 101 - 111 mmol/L 114 (H)  CO2 Latest Ref Range: 22 - 32 mmol/L 17 (L)  Glucose Latest Ref Range: 65 - 99 mg/dL 91  BUN Latest Ref Range: 6 - 20 mg/dL 20  Creatinine Latest Ref Range: 0.44 - 1.00 mg/dL 1.40 (H)  Calcium Latest Ref Range: 8.9 - 10.3 mg/dL 7.9 (L)  Anion gap Latest Ref Range: 5 - 15  7     Results for EALEXXIS, MACKERT(MRN [id number] as of 11/24/2017 13:23  Ref. Range 11/24/2017 08:31  WBC Latest Ref Range: 4.0 - 10.5 K/uL 9.2  RBC Latest Ref Range: 3.87 - 5.11 MIL/uL 2.99 (L)  Hemoglobin Latest Ref Range: 12.0 - 15.0 g/dL 8.7 (L)  HCT Latest Ref Range: 36.0 - 46.0 % 27.2 (L)  MCV Latest Ref Range: 78.0 - 100.0 fL 91.0  MCH Latest Ref Range: 26.0 - 34.0 pg 29.1  MCHC Latest Ref Range: 30.0 - 36.0 g/dL 32.0  RDW Latest Ref Range: 11.5 - 15.5 % 14.2  Platelets Latest Ref Range: 150 - 400 K/uL 138 (L)    11/22/17- EKG Sinus, No acute changes.  Echo- 2015- normal LV fxn.   Xray Chest 11/24/17 Tip of the new right internal jugular central venous catheter in the mid SVC. No pneumothorax. Endotracheal tube 3.7 cm from the carina. Enteric tube in place, tip below the diaphragm. Chronic hyperinflation. Normal heart size and mediastinal contours with aortic atherosclerosis. No consolidation, pulmonary edema or pleural fluid. Remote right rib fracture.  CT scan 11/22/17  1. No acute hemorrhage. 2. ASPECTS is 10. 3. Findings of chronic microvascular ischemia and mild volume loss.  12/15 EEG Abnormalities:  1) focal left frontotemporal irregular delta  activity 2) generalized irregular slow activity      12/15 MR brain Brain: There is no evidence of acute infarct, mass, midline shift, or extra-axial fluid collection. There is asymmetric left parieto-occipital volume loss and mild gliosis consistent with an old infarct or other remote insult. There is some hemosiderin staining in this region which may reflect prior petechial parenchymal hemorrhage and possibly prior subarachnoid hemorrhage, although some of the appearance on today's examination may be related to motion. Scattered chronic microhemorrhages are noted elsewhere in both cerebral hemispheres. Detailed assessment of the mesial temporal lobe structures is limited by moderate to severe motion artifact on the dedicated coronal oblique sequence, however there is prominent asymmetric atrophy of the left hippocampus which was also present on the prior MRI and may be related to patient's history of seizures. Mild-to-moderate cerebral atrophy is advanced for age. Scattered T2 hyperintensities are present in the subcortical and periventricular white matter and are nonspecific  but compatible with mild chronic small vessel ischemic disease, progressed from the prior MRI. Left parietal lobe gliosis has particularly progressed. Vascular: Major intracranial vascular flow voids are preserved. Skull and upper cervical spine: Unremarkable bone marrow signal. Sinuses/Orbits: Unremarkable orbits. Scattered mild paranasal sinus mucosal thickening. Minimal fluid or edema in the mastoid air cells bilaterally. IMPRESSION: 1. No acute intracranial abnormality. 2. Progressive chronic small vessel ischemia since 2007. Chronic asymmetric volume loss involving the left hippocampus and left parieto-occipital region.     Assessment and Plan:  Acute   Hypercapnic respiratory failure ,COPD,Emphysema Dementia Seizure disorder Recurrent UTI Metabolic acidosis Mild AKI/CKD      Neurology Antiepileptics per Neurology On Keppra, DPH Wean propofol Ativan for anxiety MRI brain noted EEG noted  Cardiology HD stable   Pulmonary Wean vent in the next 24 hrs or so Was restless earlier per RN Bronchodilators  As  needed     GI /nutrition Trickle feeds if not extubated soon   Renal, fluids, electrolytes   IV fluids Monitor lytes Change Foley Cath by GU after weekend. Son said its due for change  By now.  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  Hematology Monitor blood counts Dilutional anemia likely-    Endocrinology Monitor sugars   Infectious diseases F/u c/s data On zosyn  Lines and devices Chronic Foley,  12/14 ETT, G tube 12/15-RIJ cvc  CODE STATUS   Family/Patient-Counseled and updated.   Disposition-ICU   Thank you for letting me participate in the care of your patient.  The patient is critically ill with multiple organ systems failure and requires high complexity decision making for assessment and support, frequent evaluation and titration of therapies, application of advanced monitoring technologies and extensive interpretation of multiple databases like  review of chart, labs, imaging, coordinating care with other physicians and healthcare team members.. ?  Critical Care Time devoted to patient care services described in this note is 45 Minutes.?This time reflects time of care of this signee. This critical care time does not reflect procedure time, or teaching time or supervisory time of NP, but could involve care discussion time.  Note subject to typographical and grammatical errors;  Any formal questions or concerns about the content, text, or information contained within the body of this dictation  should be directly addressed to the physician  for  clarification.  Evans Lance, MD [facility] [place] Pager: [phone]

## 2017-11-24 NOTE — Progress Notes (Signed)
Hypoglycemic Event  CBG: 64 Treatment: D50 IV 25 mL  Symptoms: None  Follow-up CBG: Time:1330CBG Result 154  Possible Reasons for Event: inadequate meal intake  Comments/MD notified:Dr. Evonnie Dawes, Ronette Deter

## 2017-11-24 NOTE — Progress Notes (Addendum)
NP informed of continued oozing from CL insertion site. Orders recvd. Drg changed and pressure applied to site after thrombin patch recvd from Rx. Hemostasis achieved. Dr. Pati Gallo updated and agreed to delay plavix administration.  Site began to ooze again at 1330. Dr. Pati Gallo informed and at bs to inspect. Minimal oozing compared to overnite. New drg again applied with new thrombin patch. Both heparin and plavix to be held today.

## 2017-11-24 NOTE — Progress Notes (Signed)
Subjective: Agitated overnight requiring   Exam: Vitals:   11/24/17 0700 11/24/17 0757  BP: (!) 155/77   Pulse: 85   Resp: 15   Temp:  98.1 F (36.7 C)  SpO2: 100%    Gen: In bed, NAD Resp: non-labored breathing, no acute distress Abd: soft, nt  Neuro: MS: Sedated UX:NATFT, VF Motor: withdraws x 4  Sensory: as above.    Impression: 66 yo F with dementia presenting with breakthrough seizures in the setting of UTI. Her dilantin level was a little low on arrival and so have increased this.   Recommendations: 1) Increase dilantin to 230mg /day, div TID for now. 230mg  qhs once able to take PO.  2) Extubation per CCM.   Roland Rack, MD [facility] [phone]  If 7pm- 7am, please page neurology on call as listed in [place].

## 2017-11-25 ENCOUNTER — Ambulatory Visit: Payer: Medicare Other | Admitting: Adult Health

## 2017-11-25 ENCOUNTER — Inpatient Hospital Stay (HOSPITAL_COMMUNITY): Payer: Medicare Other

## 2017-11-25 LAB — GLUCOSE, CAPILLARY
GLUCOSE-CAPILLARY: 50 mg/dL — AB (ref 65–99)
GLUCOSE-CAPILLARY: 74 mg/dL (ref 65–99)
GLUCOSE-CAPILLARY: 80 mg/dL (ref 65–99)
GLUCOSE-CAPILLARY: 81 mg/dL (ref 65–99)
GLUCOSE-CAPILLARY: 82 mg/dL (ref 65–99)
GLUCOSE-CAPILLARY: 87 mg/dL (ref 65–99)
Glucose-Capillary: 75 mg/dL (ref 65–99)
Glucose-Capillary: 70 mg/dL (ref 65–99)

## 2017-11-25 LAB — BLOOD GAS, ARTERIAL
Acid-base deficit: 9.2 mmol/L — ABNORMAL HIGH (ref 0.0–2.0)
Acid-base deficit: 10.4 mmol/L — ABNORMAL HIGH (ref 0.0–2.0)
Bicarbonate: 15.6 mmol/L — ABNORMAL LOW (ref 20.0–28.0)
Bicarbonate: 14.5 mmol/L — ABNORMAL LOW (ref 20.0–28.0)
DRAWN BY: 27022
DRAWN BY: 245131
FIO2: 40
FIO2: 40
MECHVT: 400 mL
MECHVT: 400 mL
O2 SAT: 97.2 %
O2 Saturation: 97 %
PEEP: 5 cmH2O
PEEP: 5 cmH2O
PH ART: 7.315 — AB (ref 7.350–7.450)
PO2 ART: 104 mmHg (ref 83.0–108.0)
PO2 ART: 97.6 mmHg (ref 83.0–108.0)
Patient temperature: 98.6
Patient temperature: 98.6
RATE: 15 resp/min
RATE: 15 resp/min
pCO2 arterial: 30.9 mmHg — ABNORMAL LOW (ref 32.0–48.0)
pCO2 arterial: 29.3 mmHg — ABNORMAL LOW (ref 32.0–48.0)
pH, Arterial: 7.324 — ABNORMAL LOW (ref 7.350–7.450)

## 2017-11-25 LAB — CBC
HCT: 25 % — ABNORMAL LOW (ref 36.0–46.0)
Hemoglobin: 7.8 g/dL — ABNORMAL LOW (ref 12.0–15.0)
MCH: 28.8 pg (ref 26.0–34.0)
MCHC: 31.2 g/dL (ref 30.0–36.0)
MCV: 92.3 fL (ref 78.0–100.0)
PLATELETS: 134 10*3/uL — AB (ref 150–400)
RBC: 2.71 MIL/uL — AB (ref 3.87–5.11)
RDW: 14.5 % (ref 11.5–15.5)
WBC: 7.3 10*3/uL (ref 4.0–10.5)

## 2017-11-25 LAB — BASIC METABOLIC PANEL
ANION GAP: 8 (ref 5–15)
BUN: 18 mg/dL (ref 6–20)
CALCIUM: 8.1 mg/dL — AB (ref 8.9–10.3)
CO2: 16 mmol/L — ABNORMAL LOW (ref 22–32)
Chloride: 117 mmol/L — ABNORMAL HIGH (ref 101–111)
Creatinine, Ser: 1.46 mg/dL — ABNORMAL HIGH (ref 0.44–1.00)
GFR, EST AFRICAN AMERICAN: 42 mL/min — AB (ref >60)
GFR, EST NON AFRICAN AMERICAN: 36 mL/min — AB (ref >60)
GLUCOSE: 92 mg/dL (ref 65–99)
POTASSIUM: 3.9 mmol/L (ref 3.5–5.1)
Sodium: 141 mmol/L (ref 135–145)

## 2017-11-25 LAB — TYPE AND SCREEN
ABO/RH(D): A POS
ANTIBODY SCREEN: NEGATIVE

## 2017-11-25 LAB — CULTURE, RESPIRATORY: CULTURE: NORMAL

## 2017-11-25 LAB — PHOSPHORUS
Phosphorus: 4 mg/dL (ref 2.5–4.6)
Phosphorus: 4 mg/dL (ref 2.5–4.6)

## 2017-11-25 LAB — HEMOGLOBIN AND HEMATOCRIT, BLOOD
HCT: 24.8 % — ABNORMAL LOW (ref 36.0–46.0)
HEMOGLOBIN: 7.8 g/dL — AB (ref 12.0–15.0)

## 2017-11-25 LAB — MAGNESIUM
Magnesium: 2 mg/dL (ref 1.7–2.4)
Magnesium: 2 mg/dL (ref 1.7–2.4)

## 2017-11-25 MED ORDER — SODIUM CHLORIDE 0.9 % IV SOLN: Freq: Once | INTRAVENOUS | Status: DC

## 2017-11-25 MED ORDER — VITAL HIGH PROTEIN PO LIQD
1000.0000 mL | ORAL | Status: DC
  Administered 2017-11-25: 1000 mL

## 2017-11-25 MED ORDER — LORAZEPAM 2 MG/ML IJ SOLN
2.0000 mg | Freq: Once | INTRAMUSCULAR | Status: AC | PRN
  Administered 2017-11-26: 2 mg via INTRAVENOUS
  Filled 2017-11-25: qty 1

## 2017-11-25 MED ORDER — IPRATROPIUM-ALBUTEROL 0.5-2.5 (3) MG/3ML IN SOLN
3.0000 mL | Freq: Four times a day (QID) | RESPIRATORY_TRACT | Status: DC
  Administered 2017-11-25 – 2017-11-27 (×8): 3 mL via RESPIRATORY_TRACT
  Filled 2017-11-25 (×9): qty 3

## 2017-11-25 MED ORDER — ALBUTEROL SULFATE (2.5 MG/3ML) 0.083% IN NEBU: 2.5000 mg | INHALATION_SOLUTION | RESPIRATORY_TRACT | Status: DC | PRN

## 2017-11-25 MED ORDER — PRO-STAT SUGAR FREE PO LIQD
30.0000 mL | Freq: Two times a day (BID) | ORAL | Status: DC
  Administered 2017-11-25: 30 mL
  Filled 2017-11-25: qty 30

## 2017-11-25 MED ORDER — CARVEDILOL 6.25 MG PO TABS
6.2500 mg | ORAL_TABLET | Freq: Two times a day (BID) | ORAL | Status: DC
  Administered 2017-11-25 – 2017-11-27 (×4): 6.25 mg via ORAL
  Filled 2017-11-25 (×6): qty 1

## 2017-11-25 NOTE — Progress Notes (Addendum)
8:57am: CSW attempted to reach out to pt's family for assessment as notes indicate that pt has a history of dementia. CSW called both numbers in chart and both were the wrong number per person answering. CSW will locate alternative numbers for family of pt.   CSW consulted for potential SNF placement. CSW continues to follow for needs per PT evaluation.      Virgie Dad Mykai Wendorf, MSW, [facility] Clinical Social Worker [phone]

## 2017-11-25 NOTE — Progress Notes (Addendum)
Nutrition Consult / Follow-up  DOCUMENTATION CODES:   Severe malnutrition in context of chronic illness, Underweight  INTERVENTION:    Vital High Protein at 35 ml/h (840 ml per day)  Provides 840 kcal (1188 kcal total with Propofol), 74 gm protein, 702 ml free water daily  NUTRITION DIAGNOSIS:   Severe Malnutrition related to chronic illness(Alzheimer's dementia, COPD) as evidenced by severe muscle depletion, severe fat depletion.  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  Discussed patient in ICU rounds and with RN today. Received MD Consult for TF initiation and management. Patient is currently intubated on ventilator support MV: 6 L/min Temp (24hrs), Avg:98.3 F (36.8 C), Min:97.7 F (36.5 C), Max:98.7 F (37.1 C)  Propofol: 13.2 ml/hr providing 348 kcal from lipid.  Labs and medications reviewed.  Diet Order:  Diet NPO time specified  EDUCATION NEEDS:   No education needs have been identified at this time  Skin:  Skin Assessment: Reviewed RN Assessment  Last BM:  12/15  Height:   Ht Readings from Last 1 Encounters:  11/23/17 4\' 11"  (1.499 m)    Weight:   Wt Readings from Last 1 Encounters:  11/25/17 85 lb 8.6 oz (38.8 kg)    Ideal Body Weight:  44.6 kg  BMI:  Body mass index is 17.28 kg/m.  Estimated Nutritional Needs:   Kcal:  1200  Protein:  60-75 gm  Fluid:  1.2-1.4 L   Molli Barrows, RD, LDN, [place] Pager [phone] After Hours Pager [phone]

## 2017-11-25 NOTE — Progress Notes (Signed)
PULMONARY / CRITICAL CARE MEDICINE   Name: [name] MRN: [id number] DOB: June 13, 1951    ADMISSION DATE:  11/22/2017 CONSULTATION DATE:  11/22/2017  REFERRING MD:  Dr. 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 EMS brought her in for CODE stroke. Patient seen to have seizure like activity in the ED and intubated.  Seen by neurology. CTH normal. Sedation was turned off and patient was moving all ext L>R. Family states patient frequently goes into medicine cabinet and takes medications she is not supposed to.  Patient was hypertensive on arrival to ED with Sbp>200 which was rapidly corrected with repeat BP of 130.  Admitted for possible seizures secondary to urosepsis.  SUBJECTIVE:  Oozing from R IJ site resolved heparin and plavix held Hgb drop from 11.6 (12/14) to 7.8 today, no obvious source of bleeding Afebrile, hypertensive PSV 10/5 currently x 30 mins  VITAL SIGNS: BP (!) 172/100   Pulse 86   Temp 98.7 F (37.1 C) (Oral)   Resp 17   Ht 4\' 11"  (1.499 m)   Wt 85 lb 8.6 oz (38.8 kg)   SpO2 100%   BMI 17.28 kg/m   HEMODYNAMICS:    VENTILATOR SETTINGS: Vent Mode: PRVC FiO2 (%):  [40 %] 40 % Set Rate:  [15 bmp] 15 bmp Vt Set:  [400 mL] 400 mL PEEP:  [5 cmH20] 5 cmH20 Plateau Pressure:  [13 cmH20-15 cmH20] 14 cmH20  INTAKE / OUTPUT: I/O last 3 completed shifts: In: 2574 [I.V.:2274; IV Piggyback:300] Out: 8850 [YDXAJ:2878]  PHYSICAL EXAMINATION: General:  Critically ill thin, older than sedated female in NAD on MV HEENT: MM pink/moist, ETT/OGT, mild bruising at L IJ site- soft Neuro: On propofol, opens eyes to voice, MAE, ?intermittently following commands, pupils 4/reactive CV:  rrr, no  m/r/g PULM: even/non-labored on PSV, lungs bilaterally clear, no wheeze GI: soft, non-tender, bs active  Extremities: warm/dry, no edema, SCDs Skin: no rashes   LABS:  BMET Recent Labs  Lab 11/23/17 0429 11/24/17 0831 11/25/17 0416  NA 140 138 141  K 4.6 3.5 3.9  CL 117* 114* 117*  CO2 14* 17* 16*  BUN 22* 20 18  CREATININE 1.48* 1.40* 1.46*  GLUCOSE 109* 91 92    Electrolytes Recent Labs  Lab 11/23/17 0429 11/24/17 0831 11/25/17 0416  CALCIUM 8.0* 7.9* 8.1*  MG 1.9  --  2.0  PHOS 3.3  --  4.0    CBC Recent Labs  Lab 11/23/17 0429 11/24/17 0831 11/25/17 0416  WBC 12.3* 9.2 7.3  HGB 9.8* 8.7* 7.8*  HCT 32.0* 27.2* 25.0*  PLT 158 138* 134*    Coag's Recent Labs  Lab 11/22/17 2140  APTT 31  INR 1.07    Sepsis Markers Recent Labs  Lab 11/23/17 0119 11/23/17 0429 11/24/17 0831  LATICACIDVEN 1.8  --   --   PROCALCITON <0.10 <0.10 <0.10    ABG Recent Labs  Lab 11/23/17 0418 11/24/17 1408 11/25/17 0320  PHART 7.222* 7.324* 7.315*  PCO2ART 33.5 30.9* 29.3*  PO2ART 192.0* 104 97.6    Liver Enzymes Recent Labs  Lab 11/22/17 2140 11/23/17 0429  AST 20 18  ALT 12* 11*  ALKPHOS 141* 132*  BILITOT 0.2* 0.3  ALBUMIN 3.5 3.0*    Cardiac Enzymes Recent Labs  Lab 11/23/17 0429 11/23/17 0919 11/23/17 1646  TROPONINI 0.03* 0.03* 0.03*    Glucose Recent Labs  Lab 11/24/17 1610 11/24/17 1945 11/25/17 0009 11/25/17 0355 11/25/17 0750 11/25/17 1138  GLUCAP 73 70 81 82 75 50*    Imaging Dg Chest Port 1 View  Result Date: 11/25/2017 CLINICAL DATA:  66 year old female ventilator dependent. Subsequent encounter. EXAM: PORTABLE CHEST 1 VIEW COMPARISON:  11/23/2017 chest x-ray. FINDINGS: Endotracheal tube tip 2.2 cm above the carina. Right central line tip mid superior vena cava level. Nasogastric tube courses below the diaphragm. Tip is not included on the present exam. No infiltrate, congestive heart failure or pneumothorax. Biapical  pleural thickening stable. No plain film evidence of pulmonary malignancy. Heart size within normal limits. Calcified aorta. Remote bilateral rib fractures. IMPRESSION: No infiltrate or congestive heart failure. Aortic Atherosclerosis (ICD10-I70.0). Electronically Signed   By: Genia Del M.D.   On: 11/25/2017 07:02   Echo- 2015- normal LV fxn.  11/22/17- EKG Sinus, No acute changes.  Xray Chest12/16/18 Tip of the new right internal jugular central venous catheter in the mid SVC. No pneumothorax. Endotracheal tube 3.7 cm from the carina. Enteric tube in place, tip below the diaphragm. Chronic hyperinflation. Normal heart size and mediastinal contours with aortic atherosclerosis. No consolidation, pulmonary edema or pleural fluid. Remote right rib fracture.  CT scan12/14/18 1. No acute hemorrhage. 2. ASPECTS is 10. 3. Findings of chronic microvascular ischemia and mild volume loss.  12/15 EEG Abnormalities: 1)focal left frontotemporal irregular delta activity 2)generalized irregular slow activity  MRI 12/15:  1. No acute intracranial abnormality. 2. Progressive chronic small vessel ischemia since 2007. Chronic asymmetric volume loss involving the left hippocampus and left parieto-occipital region.  CULTURES: MRSA PCR 12/15 > neg U/A 12/14 >> multiple species  Sputum 12/14 >> norm flora Blood 12/14 >> ngtd  ANTIBIOTICS: Zosyn 12/15 >>   SIGNIFICANT EVENTS: 12/14 > Presents to ED/ intubated   LINES/TUBES: Chronic foley ETT 12/14 >> 12/15 R IJ TL CVC >>  DISCUSSION: 66 year old female presents to ED with facial twitching and right sided weakness. Intubated in ED. CT head negative. Possible seizures in the setting of sepsis/UTI  ASSESSMENT / PLAN:  PULMONARY A: Respiratory Insufficiency in setting of Encephalopathy  H/O COPD, Tobacco Abuse  P:   Vent Support Trend ABG/CXR Pulmonary Hygiene  VAP protocol   Daily SBT duonebs q 6, prn albuterol    CARDIOVASCULAR A:  HTN Urgency   H/O HLD, HTN P:  Cardiac Monitoring  Hydralazine PRN for systolic >681  resume home Coreg  Troponin trend stable  Continue Lipitor hold plavix   RENAL A:   Hyperkalemia - resolved AKI (Unsure baseline Crt)  H/O Artifical Bladder with self-cath  Hyperchloremic acidosis P:   D/c IVF Foley to be changed today by Urology 12/17 Trend BMP /mag/phos/ urinary output/ daily weight Replace electrolytes as indicated Avoid nephrotoxic agents, ensure adequate renal perfusion  GASTROINTESTINAL A:   SUP P:   NPO PPI Start TF  HEMATOLOGIC A:   Anemia  -11.6 on 12/14 -> 7.8  - no obvious blood loss or source, hemodynamically stable P:  Trend CBC  Repeat hgb at 1600 Holding heparin and plavix Continue SCDs Type and screen now, Transfuse for Hgb < 7 Consider imaging- ? Frequent falls at home, head CT neg, pt has been hemodynamically stable  INFECTIOUS A:   Hx Recurrent UTIs- +nitrates and leukocytes   Possible  Aspiration Event-  CXR neg - PCT reassuring, WBC normal, afebrile P:   Trend WBC and Fever Curve Follow Culture Data  Resend UC Continue day 3/ x zosyn   ENDOCRINE A:   No issues    P:   CBG q 4  NEUROLOGIC A:   Metabolic Encephalopathy +/- breakthrough seizures in the setting of UTI  H/O Dementia, Seizure Disorder, Insomnia, TIA  - initial dilantin level slightly low P:   RASS goal: 0/-1  Neurology Following  Continue dilantin and keppra per neurology recs   Trend dilantin level PAD protocol with Propofol and prn Fentanyl for RASS goal 0/-1 Seizure precautions Ativan prn seizure   FAMILY  - Updates: No family at bedside 12/17.  - Inter-disciplinary family meet or Palliative Care meeting due by: 11/30/2017   Kennieth Rad, AGACNP-BC [facility] Pgr: [phone] or if no answer [phone] 11/25/2017, 12:41 PM

## 2017-11-26 ENCOUNTER — Inpatient Hospital Stay (HOSPITAL_COMMUNITY): Payer: Medicare Other

## 2017-11-26 ENCOUNTER — Encounter: Payer: Self-pay | Admitting: Adult Health

## 2017-11-26 LAB — PATHOLOGIST SMEAR REVIEW

## 2017-11-26 LAB — RENAL FUNCTION PANEL
Albumin: 2.4 g/dL — ABNORMAL LOW (ref 3.5–5.0)
Anion gap: 6 (ref 5–15)
BUN: 20 mg/dL (ref 6–20)
CALCIUM: 8.2 mg/dL — AB (ref 8.9–10.3)
CHLORIDE: 120 mmol/L — AB (ref 101–111)
CO2: 16 mmol/L — ABNORMAL LOW (ref 22–32)
CREATININE: 1.4 mg/dL — AB (ref 0.44–1.00)
GFR, EST AFRICAN AMERICAN: 44 mL/min — AB (ref >60)
GFR, EST NON AFRICAN AMERICAN: 38 mL/min — AB (ref >60)
Glucose, Bld: 99 mg/dL (ref 65–99)
PHOSPHORUS: 4.6 mg/dL (ref 2.5–4.6)
Potassium: 3.9 mmol/L (ref 3.5–5.1)
SODIUM: 142 mmol/L (ref 135–145)

## 2017-11-26 LAB — GLUCOSE, CAPILLARY
GLUCOSE-CAPILLARY: 126 mg/dL — AB (ref 65–99)
GLUCOSE-CAPILLARY: 86 mg/dL (ref 65–99)
Glucose-Capillary: 62 mg/dL — ABNORMAL LOW (ref 65–99)
Glucose-Capillary: 67 mg/dL (ref 65–99)
Glucose-Capillary: 69 mg/dL (ref 65–99)
Glucose-Capillary: 98 mg/dL (ref 65–99)

## 2017-11-26 LAB — MAGNESIUM
MAGNESIUM: 2 mg/dL (ref 1.7–2.4)
MAGNESIUM: 2.2 mg/dL (ref 1.7–2.4)

## 2017-11-26 LAB — PHENYTOIN LEVEL, FREE AND TOTAL
PHENYTOIN, TOTAL: 7.2 ug/mL — AB (ref 10.0–20.0)
Phenytoin, Free: 1.4 ug/mL (ref 1.0–2.0)

## 2017-11-26 LAB — CBC
HCT: 23.4 % — ABNORMAL LOW (ref 36.0–46.0)
Hemoglobin: 7.3 g/dL — ABNORMAL LOW (ref 12.0–15.0)
MCH: 29.1 pg (ref 26.0–34.0)
MCHC: 31.2 g/dL (ref 30.0–36.0)
MCV: 93.2 fL (ref 78.0–100.0)
PLATELETS: 121 10*3/uL — AB (ref 150–400)
RBC: 2.51 MIL/uL — AB (ref 3.87–5.11)
RDW: 14.6 % (ref 11.5–15.5)
WBC: 7.2 10*3/uL (ref 4.0–10.5)

## 2017-11-26 LAB — PHOSPHORUS
PHOSPHORUS: 4.5 mg/dL (ref 2.5–4.6)
PHOSPHORUS: 4.4 mg/dL (ref 2.5–4.6)

## 2017-11-26 MED ORDER — HALOPERIDOL LACTATE 5 MG/ML IJ SOLN
1.0000 mg | INTRAMUSCULAR | Status: DC | PRN
  Administered 2017-11-27 (×3): 1 mg via INTRAVENOUS
  Filled 2017-11-26 (×3): qty 1

## 2017-11-26 MED ORDER — SODIUM CHLORIDE 0.9 % IV SOLN
0.4000 ug/kg/h | INTRAVENOUS | Status: DC
  Administered 2017-11-26: 0.6 ug/kg/h via INTRAVENOUS
  Administered 2017-11-26: 1.2 ug/kg/h via INTRAVENOUS
  Filled 2017-11-26 (×2): qty 2

## 2017-11-26 MED ORDER — DEXMEDETOMIDINE HCL IN NACL 400 MCG/100ML IV SOLN
0.4000 ug/kg/h | INTRAVENOUS | Status: DC
  Administered 2017-11-26 (×2): 1.7 ug/kg/h via INTRAVENOUS
  Filled 2017-11-26 (×2): qty 100

## 2017-11-26 NOTE — Progress Notes (Signed)
RT assisting RN with patient turn prior to extubation.  Pt began thrashing and RT attempted to help hold her head to prevent ETT from dislodging.  RT right index finger slipped into pts mouth during thrashing and she bit me through glove.  MD made aware.

## 2017-11-26 NOTE — Procedures (Signed)
Extubation Procedure Note  Patient Details:   Name: [name] DOB: 1950-12-11 MRN: [id number]   Airway Documentation:     Evaluation  O2 sats: stable throughout and currently acceptable Complications: No apparent complications Patient did tolerate procedure well. Bilateral Breath Sounds: Clear   Yes  Lenord Fellers T 11/26/2017, 2:54 PM

## 2017-11-26 NOTE — Progress Notes (Signed)
PCCM Interval Progress Note  Per patient's son, patient is followed by Dr. Earney Navy with Urology in [place].  Has chronic indwelling foley due to be changed on 12/28.    Only urology records in EMR from [facility] in 2014 which show patient had endstage IC with neobladder reconstruction.  Also noted to have smaller left kidney with 16% functioning based on NM renal study 06/23/2013 with plans to monitor at that time.   Dr. Jackson Latino with Urology consulted and graciously agrees to consult.  Left urethral stent noted on KUB.   Medical records from Dr. Nila Nephew requested from office to gain further understanding of patient's urological history.  I actually spoke to Dr. Nila Nephew as he happened to be standing by phone who stated her urine always looks bad because of her poor intake however she is rarely symptomatic with it.  He changes her left urethral stent every 3-4 months and it is due to be changed as well as her indwelling foley.  Her last urine culture as of 9/25 showed ecoli and enterococcus and was treated with 5 days of linezolid.  He stated he would be happy to talk to Urology at [facility] if they call, [phone].    Kennieth Rad, AGACNP-BC [facility] Pgr: [phone] or if no answer [phone] 11/26/2017, 5:35 PM

## 2017-11-26 NOTE — Progress Notes (Signed)
PULMONARY / CRITICAL CARE MEDICINE   Name: [name] MRN: [id number] DOB: 07-13-1951    ADMISSION DATE:  11/22/2017 CONSULTATION DATE:  11/22/2017  REFERRING MD:  Dr. 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 EMS brought her in for CODE stroke. Patient seen to have seizure like activity in the ED and intubated.  Seen by neurology. CTH normal. Sedation was turned off and patient was moving all ext L>R. Family states patient frequently goes into medicine cabinet and takes medications she is not supposed to.  Patient was hypertensive on arrival to ED with Sbp>200 which was rapidly corrected with repeat BP of 130.  Admitted for possible seizures secondary to urosepsis.  SUBJECTIVE:  Asymptomatic CBG 67 when TF held for AED this am  Weaning on PSV 8/5 on propofol 30 mcg and still requiring ativan for agitation since 0700, now on 5/5 weaning well but will not f/c. No signs of bleeding  VITAL SIGNS: BP (!) 143/60   Pulse 93   Temp 98.3 F (36.8 C) (Oral)   Resp (!) 21   Ht 4\' 11"  (1.499 m)   Wt 89 lb 1.1 oz (40.4 kg)   SpO2 100%   BMI 17.99 kg/m   HEMODYNAMICS:    VENTILATOR SETTINGS: Vent Mode: PSV;CPAP FiO2 (%):  [40 %] 40 % Set Rate:  [15 bmp] 15 bmp Vt Set:  [400 mL] 400 mL PEEP:  [5 cmH20] 5 cmH20 Pressure Support:  [5 cmH20-10 cmH20] 5 cmH20 Plateau Pressure:  [13 cmH20-15 cmH20] 13 cmH20  INTAKE / OUTPUT: I/O last 3 completed shifts: In: 2342.5 [I.V.:1023.7; Other:120; NG/GT:683.8; IV MCNOBSJGG:836] Out: 6294 [Urine:1055; Emesis/NG output:400]  PHYSICAL EXAMINATION: General:  Critically ill thin female on MV, in NAD HEENT: MM pink/moist/ ETT/ OGT, r IJ site soft, site with  bruising Neuro: Will  CV: rrr, 2+ pulses PULM: even/non-labored on PSV 5/5, lungs bilaterally clear GI: soft, non-tender, bs active  Extremities: warm/dry, no edema  Skin: no rashes  LABS:  BMET Recent Labs  Lab 11/24/17 0831 11/25/17 0416 11/26/17 0347  NA 138 141 142  K 3.5 3.9 3.9  CL 114* 117* 120*  CO2 17* 16* 16*  BUN 20 18 20   CREATININE 1.40* 1.46* 1.40*  GLUCOSE 91 92 99    Electrolytes Recent Labs  Lab 11/24/17 0831 11/25/17 0416 11/25/17 1530 11/26/17 0347  CALCIUM 7.9* 8.1*  --  8.2*  MG  --  2.0 2.0 2.0  PHOS  --  4.0 4.0 4.5  4.6    CBC Recent Labs  Lab 11/24/17 0831 11/25/17 0416 11/25/17 1530 11/26/17 0347  WBC 9.2 7.3  --  7.2  HGB 8.7* 7.8* 7.8* 7.3*  HCT 27.2* 25.0* 24.8* 23.4*  PLT 138* 134*  --  121*    Coag's Recent Labs  Lab 11/22/17 2140  APTT 31  INR 1.07    Sepsis Markers Recent Labs  Lab 11/23/17 0119 11/23/17 0429 11/24/17 0831  LATICACIDVEN 1.8  --   --   PROCALCITON <0.10 <0.10 <0.10    ABG Recent Labs  Lab 11/23/17 0418 11/24/17 1408 11/25/17 0320  PHART 7.222* 7.324* 7.315*  PCO2ART 33.5 30.9* 29.3*  PO2ART 192.0* 104 97.6  Liver Enzymes Recent Labs  Lab 11/22/17 2140 11/23/17 0429 11/26/17 0347  AST 20 18  --   ALT 12* 11*  --   ALKPHOS 141* 132*  --   BILITOT 0.2* 0.3  --   ALBUMIN 3.5 3.0* 2.4*    Cardiac Enzymes Recent Labs  Lab 11/23/17 0429 11/23/17 0919 11/23/17 1646  TROPONINI 0.03* 0.03* 0.03*    Glucose Recent Labs  Lab 11/25/17 1952 11/25/17 2350 11/26/17 0733 11/26/17 0911 11/26/17 0914 11/26/17 1122  GLUCAP 70 74 62* 41* 67 69    Imaging No results found. Echo- 2015- normal LV fxn.  11/22/17- EKG Sinus, No acute changes.  Xray Chest12/16/18 Tip of the new right internal jugular central venous catheter in the mid SVC. No pneumothorax. Endotracheal tube 3.7 cm from the carina. Enteric tube in place, tip below the diaphragm. Chronic hyperinflation.  Normal heart size and mediastinal contours with aortic atherosclerosis. No consolidation, pulmonary edema or pleural fluid. Remote right rib fracture.  CT scan12/14/18 1. No acute hemorrhage. 2. ASPECTS is 10. 3. Findings of chronic microvascular ischemia and mild volume loss.  12/15 EEG Abnormalities: 1)focal left frontotemporal irregular delta activity 2)generalized irregular slow activity  MRI 12/15:  1. No acute intracranial abnormality. 2. Progressive chronic small vessel ischemia since 2007. Chronic asymmetric volume loss involving the left hippocampus and left parieto-occipital region.  CULTURES: MRSA PCR 12/15 > neg U/A 12/14 >> multiple species  Sputum 12/14 >> norm flora Blood 12/14 >> ngtd  ANTIBIOTICS: Zosyn 12/15 >>   SIGNIFICANT EVENTS: 12/14 > Presents to ED/ intubated   LINES/TUBES: Chronic foley ETT 12/14 >> 12/15 R IJ TL CVC >>  DISCUSSION: 66 year old female presents to ED with facial twitching and right sided weakness. Intubated in ED. CT head negative. Possible seizures in the setting of sepsis/UTI  ASSESSMENT / PLAN:  PULMONARY A: Respiratory Insufficiency in setting of Encephalopathy  H/O COPD, Tobacco Abuse  P:   Full vent support 8cc/kg Continue SBT and daily- currently is weaning well on 5/5 but mental status and agitation will make extubation high risk/ complex Will switch sedation to precedex to optimize agitation;  as patient chronically on opioids and benzo's, suspect high tolerance Trend CXR Pulmonary Hygiene  VAP protocol   Duonebs q 6, prn albuterol   CARDIOVASCULAR A:  HTN Urgency   H/O HLD, HTN P:  Cardiac Monitoring  Hydralazine PRN for systolic >811  Continue coreg Continue Lipitor holding plavix   RENAL A:   Hyperkalemia - resolved AKI (Unsure baseline Crt)  H/O Artifical Bladder with self-cath  Hyperchloremic acidosis P:   Will call urology concerning foley change Trend BMP /mag/phos/  urinary output/ daily weight Replace electrolytes as indicated Avoid nephrotoxic agents, ensure adequate renal perfusion  GASTROINTESTINAL A:   SUP P:   NPO PPI Continue TF  HEMATOLOGIC A:   Anemia  -11.6 on 12/14 -> 7.8 -> 7.3 - no obvious blood loss or source, hemodynamically stable P:  Trend CBC  Holding heparin and plavix Continue SCDs Transfuse for Hgb < 7  INFECTIOUS A:   Hx Recurrent UTIs- +nitrates and leukocytes   Possible Aspiration Event-  CXR neg - PCT reassuring, WBC normal, afebrile P:   Trend WBC and Fever Curve Follow Cultures Continue day 4/ x zosyn   ENDOCRINE A:   Mild hypoglycemia- asymptomatic  P:   CBG q 4 or more frequent if abnormal CBG Continue TF  NEUROLOGIC A:   Metabolic Encephalopathy +/- breakthrough seizures in the  setting of UTI  H/O Dementia, Seizure Disorder, Insomnia, TIA  - on chronic home benzo and opiates  - initial dilantin level slightly low P:   RASS goal: 0/-1  Neurology Following  Continue dilantin and keppra per neurology recs   Trend dilantin level Change to precedex to help weaning from MV PRN fentanyl  Seizure precautions Ativan prn seizure   FAMILY  - Updates: Family updated by Dr. Vaughan Browner 12/17.  No family at bedside 12/18.  - Inter-disciplinary family meet or Palliative Care meeting due by: 11/30/2017   Kennieth Rad, AGACNP-BC [facility] Pgr: [phone] or if no answer [phone] 11/26/2017, 2:33 PM

## 2017-11-26 NOTE — Progress Notes (Signed)
Glucose recheck after tube feeding restored, no further tx as glucose rising and stable. CBG result "41" user error.

## 2017-11-26 NOTE — Care Management Note (Signed)
Case Management Note  Patient Details  Name: [name] MRN: [id number] Date of Birth: [birth year]  Subjective/Objective:    Pt admitted with AMS - possible seizures in the setting of sepsis/UTI                Action/Plan:  PTA from home.  Pt remains ventilated - 5 days.  At this time discharge disposition is not known - CM will continue to follow for discharge needs [facility] referral is not appropriate at this time).   Expected Discharge Date:                  Expected Discharge Plan:  Skilled Nursing Facility  In-House Referral:  Clinical Social Work  Discharge planning Services  CM Consult  Post Acute Care Choice:    Choice offered to:     DME Arranged:    DME Agency:     HH Arranged:    HH Agency:     Status of Service:     If discussed at [employer], dates discussed:    Additional Comments:  Maryclare Labrador, RN 11/26/2017, 2:18 PM

## 2017-11-26 NOTE — Progress Notes (Signed)
[facility] Progress Note Patient Name: [name] DOB: [birth year] MRN: [id number]   Date of Service  11/26/2017  HPI/Events of Note  Agitated Delirium - Already on a Precedex IV infusion at ceiling dose. QTc interval = NA.   eICU Interventions  Will order: 1. Increase ceiling on Precedex IV infusion to 1.7 mcg/kg/hour. 2. Haldol 1 mg IV Q 3 hours PRN agitation.  3. Monitor QTc interval Q 6 hours. Notify MD if QTc interval > 500 milliseconds.       Intervention Category Major Interventions: Delirium, psychosis, severe agitation - evaluation and management  Sommer,Steven Eugene 11/26/2017, 9:57 PM

## 2017-11-26 NOTE — Progress Notes (Signed)
Hypoglycemic Event  CBG:62  Treatment: TF to restart  Symptoms: None  Follow-up CBG: TimeCBG Result:  Possible Reasons for Event: TF on hold  Comments/MD notified Dr. Vaughan Browner informed, no tx except restart TF and recheck 30 min after TF restart   Gates Rigg

## 2017-11-27 DIAGNOSIS — J9690 Respiratory failure, unspecified, unspecified whether with hypoxia or hypercapnia: Secondary | ICD-10-CM

## 2017-11-27 DIAGNOSIS — J9600 Acute respiratory failure, unspecified whether with hypoxia or hypercapnia: Secondary | ICD-10-CM

## 2017-11-27 DIAGNOSIS — G40901 Epilepsy, unspecified, not intractable, with status epilepticus: Principal | ICD-10-CM

## 2017-11-27 LAB — CBC
HCT: 24.4 % — ABNORMAL LOW (ref 36.0–46.0)
Hemoglobin: 7.7 g/dL — ABNORMAL LOW (ref 12.0–15.0)
MCH: 29.1 pg (ref 26.0–34.0)
MCHC: 31.6 g/dL (ref 30.0–36.0)
MCV: 92.1 fL (ref 78.0–100.0)
PLATELETS: 117 10*3/uL — AB (ref 150–400)
RBC: 2.65 MIL/uL — ABNORMAL LOW (ref 3.87–5.11)
RDW: 14.1 % (ref 11.5–15.5)
WBC: 7.5 10*3/uL (ref 4.0–10.5)

## 2017-11-27 LAB — GLUCOSE, CAPILLARY
GLUCOSE-CAPILLARY: 104 mg/dL — AB (ref 65–99)
Glucose-Capillary: 41 mg/dL — CL (ref 65–99)
Glucose-Capillary: 115 mg/dL — ABNORMAL HIGH (ref 65–99)
Glucose-Capillary: 105 mg/dL — ABNORMAL HIGH (ref 65–99)
Glucose-Capillary: 72 mg/dL (ref 65–99)

## 2017-11-27 LAB — BASIC METABOLIC PANEL
Anion gap: 10 (ref 5–15)
BUN: 19 mg/dL (ref 6–20)
CALCIUM: 8.3 mg/dL — AB (ref 8.9–10.3)
CO2: 15 mmol/L — AB (ref 22–32)
Chloride: 121 mmol/L — ABNORMAL HIGH (ref 101–111)
Creatinine, Ser: 1.27 mg/dL — ABNORMAL HIGH (ref 0.44–1.00)
GFR calc Af Amer: 50 mL/min — ABNORMAL LOW (ref >60)
GFR calc non Af Amer: 43 mL/min — ABNORMAL LOW (ref >60)
GLUCOSE: 115 mg/dL — AB (ref 65–99)
Potassium: 3.7 mmol/L (ref 3.5–5.1)
Sodium: 146 mmol/L — ABNORMAL HIGH (ref 135–145)

## 2017-11-27 MED ORDER — SUCRALFATE 1 GM/10ML PO SUSP
1.0000 g | Freq: Two times a day (BID) | ORAL | Status: DC
  Administered 2017-11-27: 1 g via ORAL
  Filled 2017-11-27 (×2): qty 10

## 2017-11-27 MED ORDER — CITALOPRAM HYDROBROMIDE 40 MG PO TABS: 40.0000 mg | ORAL_TABLET | Freq: Every day | ORAL | Status: DC

## 2017-11-27 MED ORDER — FENTANYL CITRATE (PF) 100 MCG/2ML IJ SOLN
25.0000 ug | INTRAMUSCULAR | Status: DC | PRN
  Administered 2017-11-27 (×2): 25 ug via INTRAVENOUS
  Filled 2017-11-27 (×2): qty 2

## 2017-11-27 MED ORDER — MEMANTINE HCL 10 MG PO TABS
10.0000 mg | ORAL_TABLET | Freq: Two times a day (BID) | ORAL | Status: DC
  Administered 2017-11-27: 10 mg via ORAL
  Filled 2017-11-27 (×2): qty 1

## 2017-11-27 MED ORDER — CITALOPRAM HYDROBROMIDE 40 MG PO TABS
40.0000 mg | ORAL_TABLET | Freq: Every day | ORAL | Status: DC
  Administered 2017-11-27: 40 mg via ORAL
  Filled 2017-11-27: qty 1

## 2017-11-27 MED ORDER — ATORVASTATIN CALCIUM 10 MG PO TABS
10.0000 mg | ORAL_TABLET | Freq: Every day | ORAL | Status: DC
  Administered 2017-11-27: 10 mg via ORAL
  Filled 2017-11-27: qty 1

## 2017-11-27 MED ORDER — DONEPEZIL HCL 10 MG PO TABS
10.0000 mg | ORAL_TABLET | Freq: Every day | ORAL | Status: DC
  Filled 2017-11-27: qty 1

## 2017-11-27 NOTE — Progress Notes (Signed)
Urology  I spoke with Dr. 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  Irregardless, I would like to proceed with a Foley exchange and check a CT stone study to make sure that her AKI and urosepsis are not 2/2 a process involving her functional right kidney.    Ellison Hughs, MD

## 2017-11-27 NOTE — Consult Note (Signed)
Urology Consult   Physician requesting consult: Kennieth Rad, NP  Reason for consult: Recurrent UTIs  History of Present Illness: [name] is a 66 y.o. female with a history of end-stage IC, s/p cystectomy with neobladder in 2014, recurrent urinary tract infections, Alzheimer's dementia, COPD, seizure disorder and hypertension.  Urology was consulted to evaluate the patient for recurrent UTIs. The patient is currently very agitated and unable/unwilling to answer historical questions pertaining to her urologic history. The remainder of her history was obtained from the patient's chart.  The patient is being followed by Dr. Nila Nephew and [place], [place].  Her bladder is currently being managed with an indwelling urethral catheter in her neobladder 2/2 incomplete bladder emptying and is scheduled to have it exchanged on 12/06/17.  She also has indwelling left ureteral stent (unknown placement date) in place and in good position on recent KUB.    The patient initially presented to the [facility] emergency department on 11/22/2017 after the patient was found by her family to be having a suspected seizure at home.  Neurologic workup at that time proves to be negative for an acute intracranial process. Her her history and physical, the patient was recently started on an unknown antibiotic for a suspected urinary tract infection and is currently being treated for suspected urosepsis as the source of her altered mental status. Urine culture from 11/24/2015 grew multiple organisms with none predominant. She is currently on Zosyn for treatment of her suspected urinary tract infection.  Past Medical History:  Diagnosis Date  . Alzheimer's dementia    takes Aricept nightly  . Anemia   . COPD (chronic obstructive pulmonary disease) ([place])   . History of blood transfusion    received 2units on Feb 17,2015  . History of MRSA infection    several yrs ago  . Hyperlipidemia    takes Atorvastatin daily   . Hypertension    takes Amlodipine and Carvedilol daily  . Insomnia    takes Ambien nightly  . Renal disorder    kidney stones  . Seizures ([place])   . Subacute delirium 10/14/2013   takes Seroquel and Dilantin nightly  . Toxic encephalopathy   . UTI (lower urinary tract infection)    takes Macrodantin daily    Past Surgical History:  Procedure Laterality Date  . ABDOMINAL ANGIOGRAM N/A 01/20/2014   Procedure: ABDOMINAL ANGIOGRAM;  Surgeon: Elam Dutch, MD;  Location: [facility] CATH LAB;  Service: Cardiovascular;  Laterality: N/A;  . ABDOMINAL HYSTERECTOMY    . AMPUTATION Left 02/08/2014   Procedure: AMPUTATION Left 4th & 5th toes;  Surgeon: Conrad Castro, MD;  Location: [place];  Service: Vascular;  Laterality: Left;  . BLADDER SURGERY    . CHOLECYSTECTOMY  1995   Gall Bladder  . INSERTION OF ILIAC STENT Left 01/26/2014   Procedure: INSERTION OF ILIAC STENT- LEFT COMMON ILIAC ARTERY STENT ;  Surgeon: Conrad [place], MD;  Location: [place];  Service: Vascular;  Laterality: Left;  . KNEE SURGERY      Current Hospital Medications:  Home Meds:  Current Facility-Administered Medications for the 11/22/17 encounter [facility] Encounter)  Medication  . doxylamine (Sleep) (UNISOM) tablet 25 mg   Current Meds  Medication Sig  . atorvastatin (LIPITOR) 10 MG tablet Take 10 mg by mouth daily.   . carvedilol (COREG) 12.5 MG tablet   . citalopram (CELEXA) 40 MG tablet Take 1 tablet (40 mg total) by mouth daily.  . clopidogrel (PLAVIX) 75 MG tablet  Take 75 mg by mouth daily.  Marland Kitchen donepezil (ARICEPT) 10 MG tablet TAKE 1 BY MOUTH AT BEDTIME  . Doxylamine Succinate, Sleep, (SLEEP AID PO) Take 1 tablet by mouth as needed (Sleep).  Marland Kitchen ibuprofen (ADVIL,MOTRIN) 200 MG tablet Take 400 mg by mouth every 6 (six) hours as needed for moderate pain.  Marland Kitchen LORazepam (ATIVAN) 0.5 MG tablet Take one tablet 30 min. prior to MRI.  Take 2nd tablet to MRI appt. and repeat if needed.  You must not drink alcohol, drive, or  operate dangerous equipment when taking this medication.  . memantine (NAMENDA) 10 MG tablet Take 1 tablet (10 mg total) by mouth 2 (two) times daily.  Marland Kitchen oxycodone (OXY-IR) 5 MG capsule Take 5 mg by mouth every 6 (six) hours as needed for pain.  . phenytoin (DILANTIN) 100 MG ER capsule Take 2 capsules (200 mg total) by mouth at bedtime.    Scheduled Meds: . atorvastatin  10 mg Oral Daily  . carvedilol  6.25 mg Oral BID  . chlorhexidine gluconate (MEDLINE KIT)  15 mL Mouth Rinse BID  . citalopram  40 mg Oral Daily  . donepezil  10 mg Oral QHS  . ipratropium-albuterol  3 mL Nebulization Q6H  . mouth rinse  15 mL Mouth Rinse QID  . memantine  10 mg Oral BID  . pantoprazole (PROTONIX) IV  40 mg Intravenous QHS  . phenytoin  75 mg Per Tube Q8H   Continuous Infusions: . dexmedetomidine (PRECEDEX) IV infusion 1.7 mcg/kg/hr (11/27/17 0800)  . levETIRAcetam Stopped (11/26/17 2215)  . piperacillin-tazobactam (ZOSYN)  IV Stopped (11/27/17 0603)   PRN Meds:.albuterol, fentaNYL (SUBLIMAZE) injection, haloperidol lactate, hydrALAZINE, THROMBI-PAD  Allergies:  Allergies  Allergen Reactions  . Codeine Nausea And Vomiting    REACTION: intolerance  . Latex     itching    Family History  Problem Relation Age of Onset  . Cancer Brother     Social History:  reports that she has been smoking cigarettes.  She has been smoking about 0.75 packs per day. she has never used smokeless tobacco. She reports that she does not drink alcohol or use drugs.  ROS: A complete review of systems was performed.  All systems are negative except for pertinent findings as noted.  Physical Exam:  Vital signs in last 24 hours: Temp:  [95.6 F (35.3 C)-98.6 F (37 C)] 95.6 F (35.3 C) (12/19 0700) Pulse Rate:  [57-93] 65 (12/19 0800) Resp:  [11-25] 14 (12/19 0800) BP: (97-180)/(49-102) 120/54 (12/19 0800) SpO2:  [85 %-100 %] 100 % (12/19 0811) FiO2 (%):  [40 %] 40 % (12/18 1134) Weight:  [41 kg (90 lb 6.2  oz)] 41 kg (90 lb 6.2 oz) (12/19 0445) Constitutional:  Alert , but agitated and unwilling to answer historical questions Cardiovascular: Regular rate and rhythm, No JVD Respiratory: Speaking clearly and yelling at staff. no audible wheezing GI: Abdomen is soft, nontender, nondistended, no abdominal masses GU: No CVA tenderness, Foley in place and draining cloudy yellow urine Lymphatic: No lymphadenopathy Neurologic: Grossly intact, no focal deficits Psychiatric: Agitated and unwilling to answer historical questions  Laboratory Data:  Recent Labs    11/25/17 0416 11/25/17 1530 11/26/17 0347 11/27/17 0442  WBC 7.3  --  7.2 7.5  HGB 7.8* 7.8* 7.3* 7.7*  HCT 25.0* 24.8* 23.4* 24.4*  PLT 134*  --  121* 117*    Recent Labs    11/25/17 0416 11/26/17 0347 11/27/17 0442  NA 141 142 146*  K  3.9 3.9 3.7  CL 117* 120* 121*  GLUCOSE 92 99 115*  BUN 18 20 19   CALCIUM 8.1* 8.2* 8.3*  CREATININE 1.46* 1.40* 1.27*     Results for orders placed or performed during the hospital encounter of 11/22/17 (from the past 24 hour(s))  Glucose, capillary     Status: None   Collection Time: 11/26/17 11:22 AM  Result Value Ref Range   Glucose-Capillary 69 65 - 99 mg/dL   Comment 1 Capillary Specimen    Comment 2 Notify RN   Glucose, capillary     Status: None   Collection Time: 11/26/17  3:00 PM  Result Value Ref Range   Glucose-Capillary 98 65 - 99 mg/dL   Comment 1 Capillary Specimen    Comment 2 Notify RN   Magnesium     Status: None   Collection Time: 11/26/17  4:43 PM  Result Value Ref Range   Magnesium 2.2 1.7 - 2.4 mg/dL  Phosphorus     Status: None   Collection Time: 11/26/17  4:43 PM  Result Value Ref Range   Phosphorus 4.4 2.5 - 4.6 mg/dL  Glucose, capillary     Status: None   Collection Time: 11/26/17  7:46 PM  Result Value Ref Range   Glucose-Capillary 86 65 - 99 mg/dL   Comment 1 Notify RN   Glucose, capillary     Status: Abnormal   Collection Time: 11/26/17 11:51 PM   Result Value Ref Range   Glucose-Capillary 126 (H) 65 - 99 mg/dL   Comment 1 Notify RN   Glucose, capillary     Status: Abnormal   Collection Time: 11/27/17  3:53 AM  Result Value Ref Range   Glucose-Capillary 104 (H) 65 - 99 mg/dL   Comment 1 Notify RN   Basic metabolic panel     Status: Abnormal   Collection Time: 11/27/17  4:42 AM  Result Value Ref Range   Sodium 146 (H) 135 - 145 mmol/L   Potassium 3.7 3.5 - 5.1 mmol/L   Chloride 121 (H) 101 - 111 mmol/L   CO2 15 (L) 22 - 32 mmol/L   Glucose, Bld 115 (H) 65 - 99 mg/dL   BUN 19 6 - 20 mg/dL   Creatinine, Ser 1.27 (H) 0.44 - 1.00 mg/dL   Calcium 8.3 (L) 8.9 - 10.3 mg/dL   GFR calc non Af Amer 43 (L) >60 mL/min   GFR calc Af Amer 50 (L) >60 mL/min   Anion gap 10 5 - 15  CBC     Status: Abnormal   Collection Time: 11/27/17  4:42 AM  Result Value Ref Range   WBC 7.5 4.0 - 10.5 K/uL   RBC 2.65 (L) 3.87 - 5.11 MIL/uL   Hemoglobin 7.7 (L) 12.0 - 15.0 g/dL   HCT 24.4 (L) 36.0 - 46.0 %   MCV 92.1 78.0 - 100.0 fL   MCH 29.1 26.0 - 34.0 pg   MCHC 31.6 30.0 - 36.0 g/dL   RDW 14.1 11.5 - 15.5 %   Platelets 117 (L) 150 - 400 K/uL  Glucose, capillary     Status: Abnormal   Collection Time: 11/27/17  7:48 AM  Result Value Ref Range   Glucose-Capillary 115 (H) 65 - 99 mg/dL   Comment 1 Capillary Specimen    Recent Results (from the past 240 hour(s))  MRSA PCR Screening     Status: None   Collection Time: 11/23/17 12:42 AM  Result Value Ref Range Status  MRSA by PCR NEGATIVE NEGATIVE Final    Comment:        The GeneXpert MRSA Assay (FDA approved for NASAL specimens only), is one component of a comprehensive MRSA colonization surveillance program. It is not intended to diagnose MRSA infection nor to guide or monitor treatment for MRSA infections.   Culture, respiratory (NON-Expectorated)     Status: None   Collection Time: 11/23/17  1:04 AM  Result Value Ref Range Status   Specimen Description TRACHEAL ASPIRATE  Final    Special Requests NONE  Final   Gram Stain   Final    MODERATE WBC PRESENT,BOTH PMN AND MONONUCLEAR RARE SQUAMOUS EPITHELIAL CELLS PRESENT MODERATE GRAM POSITIVE COCCI IN PAIRS IN CHAINS RARE GRAM POSITIVE RODS    Culture FEW Consistent with normal respiratory flora.  Final   Report Status 11/25/2017 FINAL  Final  Culture, blood (routine x 2)     Status: None (Preliminary result)   Collection Time: 11/23/17  1:20 AM  Result Value Ref Range Status   Specimen Description BLOOD RIGHT HAND  Final   Special Requests IN PEDIATRIC BOTTLE Blood Culture adequate volume  Final   Culture NO GROWTH 3 DAYS  Final   Report Status PENDING  Incomplete  Culture, blood (routine x 2)     Status: None (Preliminary result)   Collection Time: 11/23/17  1:24 AM  Result Value Ref Range Status   Specimen Description BLOOD LEFT HAND  Final   Special Requests IN PEDIATRIC BOTTLE Blood Culture adequate volume  Final   Culture NO GROWTH 3 DAYS  Final   Report Status PENDING  Incomplete  Culture, Urine     Status: Abnormal   Collection Time: 11/23/17  1:55 AM  Result Value Ref Range Status   Specimen Description URINE, CATHETERIZED  Final   Special Requests NONE  Final   Culture MULTIPLE SPECIES PRESENT, SUGGEST RECOLLECTION (A)  Final   Report Status 11/24/2017 FINAL  Final    Renal Function: Recent Labs    11/22/17 2150 11/23/17 0119 11/23/17 0429 11/24/17 0831 11/25/17 0416 11/26/17 0347 11/27/17 0442  CREATININE 1.60* 1.55* 1.48* 1.40* 1.46* 1.40* 1.27*   Estimated Creatinine Clearance: 28.2 mL/min (A) (by C-G formula based on SCr of 1.27 mg/dL (H)).  Radiologic Imaging: No results found.  I independently reviewed the above imaging studies.  Impression/Recommendation  -History of end-stage interstitial cystitis status post cystectomy with neobladder creation in 2014. Her bladder is currently managed with an indwelling urethral catheter. She also has a history of left renal atrophy with  an indwelling left JJ stent placed (unknown placement date) -Recurrent urinary tract infections -Suspected urosepsis leading to altered mental status -History of Alzheimer's dementia  I attempted to contact Dr. Nila Nephew concerning the patient's history, but he was unavailable (I will continue to try to contact him). At this point, I would recommend exchanging her Foley catheter and having her follow up with Dr. Nila Nephew for left JJ stent exchange in the near future as the rationale for her left ureteral stent placement is unknown to me at this time.  She clinical course has improved with IV Zosyn.  I recommend transitioning to oral abx and continuing therapy for a total of 7 days.  Ellison Hughs, MD [facility] 11/27/2017, 9:19 AM

## 2017-11-27 NOTE — Progress Notes (Signed)
Pt. Family at bedside visiting, pt more alert and compliant with staff and care.  Restraints weaning off along with Precedex.  Pt. Educated and verbalized understanding safety precaution and non combative behavior. Nurse will continue to monitor vitals and pt safety.  Call bell in reach, family updated safety maintained.
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
## 2017-11-27 NOTE — Progress Notes (Addendum)
CSW has reached out to both husband and son to gather needed information on pt. CSW has left VM asking that CSW be called back ASAP. CSW costumes to follow for discharge needs at this time.     [name] [name], MSW, [place] [facility] Clinical Social Worker [phone]

## 2017-11-27 NOTE — Evaluation (Signed)
Clinical/Bedside Swallow Evaluation Patient Details  Name: [name] MRN: [id number] Date of Birth: [birth year]  Today's Date: 11/27/2017 Time: SLP Start Time (ACUTE ONLY): 0931 SLP Stop Time (ACUTE ONLY): 0943 SLP Time Calculation (min) (ACUTE ONLY): 12 min  Past Medical History:  Past Medical History:  Diagnosis Date  . Alzheimer's dementia    takes Aricept nightly  . Anemia   . COPD (chronic obstructive pulmonary disease) ([place])   . History of blood transfusion    received 2units on Feb 17,2015  . History of MRSA infection    several yrs ago  . Hyperlipidemia    takes Atorvastatin daily  . Hypertension    takes Amlodipine and Carvedilol daily  . Insomnia    takes Ambien nightly  . Renal disorder    kidney stones  . Seizures ([place])   . Subacute delirium 10/14/2013   takes Seroquel and Dilantin nightly  . Toxic encephalopathy   . UTI (lower urinary tract infection)    takes Macrodantin daily   Past Surgical History:  Past Surgical History:  Procedure Laterality Date  . ABDOMINAL ANGIOGRAM N/A 01/20/2014   Procedure: ABDOMINAL ANGIOGRAM;  Surgeon: Elam Dutch, MD;  Location: [facility] CATH LAB;  Service: Cardiovascular;  Laterality: N/A;  . ABDOMINAL HYSTERECTOMY    . AMPUTATION Left 02/08/2014   Procedure: AMPUTATION Left 4th & 5th toes;  Surgeon: Conrad [place], MD;  Location: [place];  Service: Vascular;  Laterality: Left;  . BLADDER SURGERY    . CHOLECYSTECTOMY  1995   Gall Bladder  . INSERTION OF ILIAC STENT Left 01/26/2014   Procedure: INSERTION OF ILIAC STENT- LEFT COMMON ILIAC ARTERY STENT ;  Surgeon: Conrad Bad Axe, MD;  Location: [place];  Service: Vascular;  Laterality: Left;  . KNEE SURGERY     HPI:  Pt is a 66 year old female who presented to the ED with facial twitching and right sided weakness. MRI brain was negative for acute findings. differential dx includes possible seizures in the setting of sepsis/UTI. She required intubation 12/14-12/18. 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 Her voice has adequate volume post-extubation but it is mild to moderately hoarse, suggestive of decreased glottal integrity. When she is alert she has appropriate oral acceptance and seemingly swift oral transfer with liquids and purees, although she does have a delayed, strong cough response following trials of water. Suspect that she has a combination of an acute, reversible dysphagia due to prolonged intubation as well as a more cognitively-based dysphagia. For today would administer meds crushed in puree and ice chips after oral care during periods when she is alert. Will f/u for readiness to start more POs. SLP Visit Diagnosis: Dysphagia, unspecified (R13.10)    Aspiration Risk  Moderate aspiration risk    Diet Recommendation NPO except meds;Ice chips PRN after oral care   Medication Administration: Crushed with puree    Other  Recommendations Oral Care Recommendations: Oral care QID;Oral care prior to ice chip/H20   Follow up Recommendations (tba)      Frequency and Duration min 2x/week  2 weeks       Prognosis Prognosis for Safe Diet Advancement: Good Barriers to Reach Goals: Cognitive deficits      Swallow Study   General HPI:  Pt is a 66 year old female who presented to the ED with facial twitching and right sided weakness. MRI brain was negative for acute findings. differential dx includes possible seizures in the setting of sepsis/UTI. She required intubation 12/14-12/18. 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 with hx of frequent UTI, seizure disorder  Type of Study: Bedside Swallow Evaluation Previous Swallow Assessment: none in chart Diet Prior to this Study:  NPO Temperature Spikes Noted: No Respiratory Status: Nasal cannula History of Recent Intubation: Yes Length of Intubations (days): 5 days Date extubated: 11/26/17 Behavior/Cognition: Requires cueing;Other (Comment)(alertness fluctuates throughout eval) Oral Cavity Assessment: Dry Oral Care Completed by SLP: Yes Oral Cavity - Dentition: Missing dentition Self-Feeding Abilities: Needs assist Patient Positioning: Upright in bed Baseline Vocal Quality: Hoarse    Oral/Motor/Sensory Function Overall Oral Motor/Sensory Function: Other (comment)(difficulty following commands to assess)   Ice Chips Ice chips: Within functional limits Presentation: Spoon   Thin Liquid Thin Liquid: Impaired Presentation: Straw Pharyngeal  Phase Impairments: Cough - Delayed    Nectar Thick Nectar Thick Liquid: Not tested   Honey Thick Honey Thick Liquid: Not tested   Puree Puree: Within functional limits Presentation: Spoon   Solid   GO   Solid: Not tested        Germain Osgood 11/27/2017,10:05 AM  Germain Osgood, M.A. CCC-SLP [phone]

## 2017-11-27 NOTE — Progress Notes (Signed)
Patient has been off Precedex since 1300.  Pt is alert and following commands and appropriate with staff. She has been demanding to go home and leave AMA.  Nurse and other staff members re-educated the patient and explained that we recommend that she stay in the hospital a few more days until she is stable to go home; however she is insistent that she wants to leave and she aware of the safety risk. She is competent to make decisions. Nurse and pt signed appropriate form for AMA, New Foley catheter place, CVC removed, family updated, pt son will transporting pt home. Safety maintained.

## 2017-11-27 NOTE — Progress Notes (Signed)
Subjective: continues to be agitated and requiring Versed and fentanyl.  Very sleepy but awakens to tactile stimuli --not enough to follow commands. No active seizures noted.   Exam: Vitals:   11/27/17 0800 11/27/17 0811  BP: (!) 120/54   Pulse: 65   Resp: 14   Temp:    SpO2: 100% 100%    HEENT-  Normocephalic, no lesions, without obvious abnormality.  Normal external eye and conjunctiva.  Normal TM's bilaterally.  Normal auditory canals and external ears. Normal external nose, mucus membranes and septum.  Normal pharynx. Cardiovascular- S1, S2 normal, pulses palpable throughout   Lungs- chest clear, no wheezing, rales, normal symmetric air entry Abdomen- normal findings: bowel sounds normal    Neuro:  CN: Pupils are equal and round. They are symmetrically reactive from 3-->2 mm. EOMI without nystagmus. Facial sensation is intact to light touch. Face is symmetric at rest with normal strength and mobility. Hearing is intact to conversational voice. Motor: withdraws all 4 extremities.   Sensation:  as above    Medications:  Scheduled: . atorvastatin  10 mg Oral Daily  . carvedilol  6.25 mg Oral BID  . chlorhexidine gluconate (MEDLINE KIT)  15 mL Mouth Rinse BID  . citalopram  40 mg Oral Daily  . donepezil  10 mg Oral QHS  . ipratropium-albuterol  3 mL Nebulization Q6H  . mouth rinse  15 mL Mouth Rinse QID  . memantine  10 mg Oral BID  . pantoprazole (PROTONIX) IV  40 mg Intravenous QHS  . phenytoin  75 mg Per Tube Q8H   Continuous: . dexmedetomidine (PRECEDEX) IV infusion 1.7 mcg/kg/hr (11/27/17 0800)  . levETIRAcetam Stopped (11/26/17 2215)  . piperacillin-tazobactam (ZOSYN)  IV Stopped (11/27/17 0603)    Pertinent Labs/Diagnostics: Dilantin  Level pending  No results found.   Etta Quill PA-C [facility] [phone]  Impression: 66 yo F with dementia presenting with breakthrough seizures in the setting of UTI. While in hospital, Increased dilantin to  263m/day, div TID for now. [id number] once able to take PO.   Recommendations: 1) Dilantin per pharmacy 2) neurology will S/O    11/27/2017, 9:40 AM

## 2017-11-27 NOTE — Progress Notes (Signed)
PULMONARY / CRITICAL CARE MEDICINE   Name: [name] MRN: [id number] DOB: [birth year]    ADMISSION DATE:  11/22/2017 CONSULTATION DATE:  11/22/2017  REFERRING MD:  Dr. 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 EMS brought her in for CODE stroke. Patient seen to have seizure like activity in the ED and intubated.  Seen by neurology. CTH normal. Sedation was turned off and patient was moving all ext L>R. Family states patient frequently goes into medicine cabinet and takes medications she is not supposed to.  Patient was hypertensive on arrival to ED with Sbp>200 which was rapidly corrected with repeat BP of 130.  Admitted for possible seizures secondary to urosepsis.  SUBJECTIVE:  Extubated 12/18, tolerated well. Agitation overnight and this AM, kicked nursing staff overnight.  Precedex ceiling increased, given 1mg  Haldol.  Per RN, haldol did not help much but pt does respond well to fentanyl. This AM, she is tearful and wants to go home.  VITAL SIGNS: BP (!) 97/49   Pulse 74   Temp (!) 97.2 F (36.2 C) (Oral)   Resp 17   Ht 4\' 11"  (1.499 m)   Wt 41 kg (90 lb 6.2 oz)   SpO2 100%   BMI 18.26 kg/m   HEMODYNAMICS:    VENTILATOR SETTINGS: Vent Mode: PSV;CPAP FiO2 (%):  [40 %] 40 % PEEP:  [5 cmH20] 5 cmH20 Pressure Support:  [5 cmH20] 5 cmH20  INTAKE / OUTPUT: I/O last 3 completed shifts: In: 2264.4 [I.V.:424.4; Other:370; NG/GT:790; IV Piggyback:680] Out: 7124 [Urine:1345; Emesis/NG output:400]  PHYSICAL EXAMINATION: General:  Chronically ill appearing thin female, agitated. HEENT:  [place] / AT. MM dry. Neuro: A&O x 3, no deficits. CV: RRR, no M/R/G. PULM: Non labored, CTAB. GI: soft,  non-tender, bs active. Extremities: warm/dry, no edema.  Skin: no rashes.  LABS:  BMET Recent Labs  Lab 11/25/17 0416 11/26/17 0347 11/27/17 0442  NA 141 142 146*  K 3.9 3.9 3.7  CL 117* 120* 121*  CO2 16* 16* 15*  BUN 18 20 19   CREATININE 1.46* 1.40* 1.27*  GLUCOSE 92 99 115*    Electrolytes Recent Labs  Lab 11/25/17 0416 11/25/17 1530 11/26/17 0347 11/26/17 1643 11/27/17 0442  CALCIUM 8.1*  --  8.2*  --  8.3*  MG 2.0 2.0 2.0 2.2  --   PHOS 4.0 4.0 4.5  4.6 4.4  --     CBC Recent Labs  Lab 11/25/17 0416 11/25/17 1530 11/26/17 0347 11/27/17 0442  WBC 7.3  --  7.2 7.5  HGB 7.8* 7.8* 7.3* 7.7*  HCT 25.0* 24.8* 23.4* 24.4*  PLT 134*  --  121* PENDING    Coag's Recent Labs  Lab 11/22/17 2140  APTT 31  INR 1.07    Sepsis Markers Recent Labs  Lab 11/23/17 0119 11/23/17 0429 11/24/17 0831  LATICACIDVEN 1.8  --   --   PROCALCITON <0.10 <0.10 <0.10    ABG Recent Labs  Lab 11/23/17 0418 11/24/17 1408 11/25/17 0320  PHART 7.222* 7.324* 7.315*  PCO2ART 33.5 30.9* 29.3*  PO2ART 192.0* 104 97.6    Liver Enzymes Recent Labs  Lab 11/22/17 2140 11/23/17 0429 11/26/17 0347  AST 20 18  --  ALT 12* 11*  --   ALKPHOS 141* 132*  --   BILITOT 0.2* 0.3  --   ALBUMIN 3.5 3.0* 2.4*    Cardiac Enzymes Recent Labs  Lab 11/23/17 0429 11/23/17 0919 11/23/17 1646  TROPONINI 0.03* 0.03* 0.03*    Glucose Recent Labs  Lab 11/26/17 1122 11/26/17 1500 11/26/17 1946 11/26/17 2351 11/27/17 0353 11/27/17 0748  GLUCAP 69 98 86 126* 104* 115*    Imaging No results found. Echo- 2015- normal LV fxn. EKG 12/14 > Sinus, No acute changes. Xray Chest12/16/18 > no acute process. CT scan12/14/18 > No acute hemorrhage. EEG 12/15 > focal left frontotemporal irregular delta activity, generalized irregular slow activity MRI 12/15 > No acute process, progressive chronic small vessel ischemia since 2007. Chronic asymmetric volume loss involving the  left hippocampus and left parieto-occipital region.  CULTURES: MRSA PCR 12/15 > neg U/A 12/14 >> multiple species  Sputum 12/14 >> norm flora Blood 12/14 >> ngtd  ANTIBIOTICS: Zosyn 12/15 > 12/19  SIGNIFICANT EVENTS: 12/14 > Presents to ED/ intubated  12/18 > extubated  LINES/TUBES: Chronic foley ETT 12/14 > 12/18 12/15 R IJ TL CVC >>  DISCUSSION: 66 year old female presents to ED with facial twitching and right sided weakness. Intubated in ED. CT head negative. Possible seizures in the setting of sepsis/UTI. Extubated 12/18.  ASSESSMENT / PLAN:  PULMONARY A: Respiratory Insufficiency in setting of Encephalopathy - s/p extubation 12/18 H/O COPD, Tobacco Abuse  P:   Limit sedation meds as able to avoid respiratory depression (though note, pt chronically on opioids and benzo's so likely has high tolerance) Bronchial hygiene Continue BD's  CARDIOVASCULAR A:  HTN Urgency   H/O HLD, HTN P:  Cardiac Monitoring  Hydralazine PRN for systolic >160  Continue coreg, lipitor Holding plavix    RENAL A:   Hyperkalemia - resolved AKI (Unsure baseline Crt)  H/O Artifical Bladder with self-cath  Hyperchloremic acidosis P:   Urology consulted 12/18 D/c saline Follow BMP  GASTROINTESTINAL A:   Nutrition P:   Swallow eval today  HEMATOLOGIC A:   Anemia - no obvious blood loss or source, hemodynamically stable Thrombocytopenia P:  Holding heparin and plavix Transfuse for Hgb < 7 Continue SCDs Monitor platelet counts  INFECTIOUS A:   Hx Recurrent UTIs - +nitrates and leukocytes   Possible Aspiration Event -  CXR neg - PCT reassuring, WBC normal, afebrile P:   Urology consulted 12/18 for foley exchange Follow Cultures Stop zosyn today (5 days total)  ENDOCRINE A:   Mild hypoglycemia - asymptomatic  P:   CBG q 4 or more frequent if abnormal CBG  NEUROLOGIC A:   Agitation - tearful AM 12/19 and wants to go home Metabolic Encephalopathy +/-  breakthrough seizures in the setting of UTI - resolved H/O Dementia, Seizure Disorder, Insomnia, TIA  P:   Limit sedation meds as able to avoid respiratory depression (though note, pt chronically on opioids and benzo's so likely has high tolerance) Safety sitter Neurology following Continue dilantin and keppra per neurology recs   Continue precedex gtt, fentanyl PRN Continue PRN haldol Add back preadmission celexa, donepezil, memantine   FAMILY  - Updates: Family updated by Dr. Vaughan Browner 12/17.  No family at bedside 12/18, 12/19.  - Inter-disciplinary family meet or Palliative Care meeting due by: 11/30/2017    Montey Hora, [place] Pager: [phone]  or [phone] 11/27/2017, 8:11 AM

## 2017-11-27 NOTE — Clinical Social Work Note (Signed)
Clinical Social Work Assessment  Patient Details  Name: MAKYNNA MANOCCHIO MRN: [id number] Date of Birth: [birth year]  Date of referral:  11/27/17               Reason for consult:  Other (Comment Required)                Permission sought to share information with:  Family Supports Permission granted to share information::  Yes, Verbal Permission Granted  Name::     Rozelle Caudle and [address] [place]::     Relationship::  husband and son (([phone] Larene Beach son) [phone] Marcello Moores Husband) )  Contact Information:     Housing/Transportation Living arrangements for the past 2 months:  Single Family Home(with son ) Source of Information:  Adult Children(Son Conservation officer, historic buildings ) Patient Interpreter Needed:  None Criminal Activity/Legal Involvement Pertinent to Current Situation/Hospitalization:  No - Comment as needed Significant Relationships:  Adult Children, Spouse Lives with:  Adult Children Do you feel safe going back to the place where you live?  Yes Need for family participation in patient care:  Yes (Comment)  Care giving concerns:  CSW spoke with pt's son Larene Beach via phone. Pt is disoriented times 4, therefore pt was unable to speak with CSW and answer questions as needed. At this time Larene Beach has not expressed any concerns to [place].    Social Worker assessment / plan:  CSW spoke with pt's son Larene Beach via phone. Larene Beach informed CSW that pt is from home with him she informed CSW that he moved back in with pt about a month ago and has been caring for pt ever since. He reports that pt is very independent, however pt is unable to cook or live alone. Pt reportedly has support from family members including husband and two sons. Larene Beach is not agreeable to SNF as he thinks that pt would be uncomfortable outside of the home. Larene Beach mentioned that pt has had [facility] [place] in the past and that worked well for pt.   Employment status:  Retired Forensic scientist:  Medicare PT Recommendations:  Not  assessed at this time Information / Referral to community resources:     Patient/Family's Response to care:  Pt's family appeared to be understanding and agreeable to plan of care at this time.   Patient/Family's Understanding of and Emotional Response to Diagnosis, Current Treatment, and Prognosis:  No further questions or concerns have been presented to CSW at this time.   Emotional Assessment Appearance:    Attitude/Demeanor/Rapport:    Affect (typically observed):  Agitated Orientation:  (pt has dementia. ) Alcohol / Substance use:  Not Applicable Psych involvement (Current and /or in the community):  No (Comment)  Discharge Needs  Concerns to be addressed:  No discharge needs identified Readmission within the last 30 days:  No Current discharge risk:  None Barriers to Discharge:  No Barriers Identified   Wetzel Bjornstad, [place] 11/27/2017, 8:57 AM

## 2017-11-27 NOTE — Progress Notes (Signed)
Pt. Complaining of cramping and stomach upset. MD notified at bedside new order place.

## 2017-11-27 NOTE — Progress Notes (Signed)
Interim progress note  Patient is off Precedex since today morning.  Her mental status is clear and she is alert She is demanding to go home and leave AMA.  I spent some time talking with the patient and explained that we recommend that she stay in the hospital a few more days until she is stable to go home However she is insistent that she wants to leave. She is competent to make decisions. We will ask her to sign AMA paperwork  I called in a prescription for increased Dilantin dose 230 mg qHS and for Levaquin 500 mg a day for 5 days for treatment of UTI to her pharmacy [facility], [place] Discussed with Dr. Lovena Neighbours, Urology.  Will try to change Foley's before she leaves I told the patient about the recommendation is to get a CT stone study.  She said she will follow-up with her outpatient urologist regarding this.  Marshell Garfinkel MD [facility] Pager [phone] If no answer or after 3pm call: [phone] 11/27/2017, 5:22 PM

## 2017-11-28 ENCOUNTER — Other Ambulatory Visit: Payer: Self-pay | Admitting: Nurse Practitioner

## 2017-11-28 DIAGNOSIS — A419 Sepsis, unspecified organism: Secondary | ICD-10-CM | POA: Diagnosis not present

## 2017-11-28 DIAGNOSIS — N3000 Acute cystitis without hematuria: Secondary | ICD-10-CM | POA: Diagnosis not present

## 2017-11-28 DIAGNOSIS — N132 Hydronephrosis with renal and ureteral calculous obstruction: Secondary | ICD-10-CM | POA: Diagnosis not present

## 2017-11-28 DIAGNOSIS — N1330 Unspecified hydronephrosis: Secondary | ICD-10-CM | POA: Diagnosis not present

## 2017-11-28 DIAGNOSIS — R1032 Left lower quadrant pain: Secondary | ICD-10-CM | POA: Diagnosis not present

## 2017-11-28 LAB — CULTURE, BLOOD (ROUTINE X 2)
CULTURE: NO GROWTH
CULTURE: NO GROWTH
SPECIAL REQUESTS: ADEQUATE
Special Requests: ADEQUATE

## 2017-11-29 DIAGNOSIS — R079 Chest pain, unspecified: Secondary | ICD-10-CM | POA: Diagnosis not present

## 2017-11-29 DIAGNOSIS — R935 Abnormal findings on diagnostic imaging of other abdominal regions, including retroperitoneum: Secondary | ICD-10-CM | POA: Diagnosis not present

## 2017-11-29 DIAGNOSIS — R1031 Right lower quadrant pain: Secondary | ICD-10-CM | POA: Diagnosis not present

## 2017-11-29 DIAGNOSIS — R531 Weakness: Secondary | ICD-10-CM | POA: Diagnosis not present

## 2017-11-29 DIAGNOSIS — N1330 Unspecified hydronephrosis: Secondary | ICD-10-CM | POA: Diagnosis not present

## 2017-11-29 DIAGNOSIS — N3000 Acute cystitis without hematuria: Secondary | ICD-10-CM | POA: Diagnosis not present

## 2017-11-29 DIAGNOSIS — R404 Transient alteration of awareness: Secondary | ICD-10-CM | POA: Diagnosis not present

## 2017-11-29 DIAGNOSIS — D649 Anemia, unspecified: Secondary | ICD-10-CM | POA: Diagnosis not present

## 2017-11-29 DIAGNOSIS — G309 Alzheimer's disease, unspecified: Secondary | ICD-10-CM | POA: Diagnosis not present

## 2017-11-30 DIAGNOSIS — N3000 Acute cystitis without hematuria: Secondary | ICD-10-CM | POA: Diagnosis not present

## 2017-11-30 DIAGNOSIS — R079 Chest pain, unspecified: Secondary | ICD-10-CM | POA: Diagnosis not present

## 2017-11-30 DIAGNOSIS — Z8673 Personal history of transient ischemic attack (TIA), and cerebral infarction without residual deficits: Secondary | ICD-10-CM | POA: Diagnosis not present

## 2017-11-30 DIAGNOSIS — R1031 Right lower quadrant pain: Secondary | ICD-10-CM | POA: Diagnosis not present

## 2017-11-30 DIAGNOSIS — Z8669 Personal history of other diseases of the nervous system and sense organs: Secondary | ICD-10-CM | POA: Diagnosis not present

## 2017-11-30 DIAGNOSIS — N1330 Unspecified hydronephrosis: Secondary | ICD-10-CM | POA: Diagnosis not present

## 2017-11-30 DIAGNOSIS — E440 Moderate protein-calorie malnutrition: Secondary | ICD-10-CM | POA: Diagnosis not present

## 2017-11-30 DIAGNOSIS — F0390 Unspecified dementia without behavioral disturbance: Secondary | ICD-10-CM | POA: Diagnosis not present

## 2017-11-30 DIAGNOSIS — I110 Hypertensive heart disease with heart failure: Secondary | ICD-10-CM | POA: Diagnosis not present

## 2017-11-30 DIAGNOSIS — N189 Chronic kidney disease, unspecified: Secondary | ICD-10-CM | POA: Diagnosis present

## 2017-11-30 DIAGNOSIS — G309 Alzheimer's disease, unspecified: Secondary | ICD-10-CM | POA: Diagnosis not present

## 2017-11-30 DIAGNOSIS — D649 Anemia, unspecified: Secondary | ICD-10-CM | POA: Diagnosis not present

## 2017-11-30 DIAGNOSIS — I5031 Acute diastolic (congestive) heart failure: Secondary | ICD-10-CM | POA: Diagnosis not present

## 2017-11-30 DIAGNOSIS — Z79899 Other long term (current) drug therapy: Secondary | ICD-10-CM | POA: Diagnosis not present

## 2017-11-30 DIAGNOSIS — I509 Heart failure, unspecified: Secondary | ICD-10-CM | POA: Diagnosis not present

## 2017-11-30 DIAGNOSIS — J449 Chronic obstructive pulmonary disease, unspecified: Secondary | ICD-10-CM | POA: Diagnosis present

## 2017-11-30 DIAGNOSIS — R935 Abnormal findings on diagnostic imaging of other abdominal regions, including retroperitoneum: Secondary | ICD-10-CM | POA: Diagnosis not present

## 2017-11-30 DIAGNOSIS — R531 Weakness: Secondary | ICD-10-CM | POA: Diagnosis not present

## 2017-11-30 DIAGNOSIS — F0280 Dementia in other diseases classified elsewhere without behavioral disturbance: Secondary | ICD-10-CM | POA: Diagnosis present

## 2017-11-30 DIAGNOSIS — F17210 Nicotine dependence, cigarettes, uncomplicated: Secondary | ICD-10-CM | POA: Diagnosis present

## 2017-12-01 DIAGNOSIS — I509 Heart failure, unspecified: Secondary | ICD-10-CM

## 2017-12-12 DIAGNOSIS — N1330 Unspecified hydronephrosis: Secondary | ICD-10-CM | POA: Diagnosis not present

## 2017-12-12 DIAGNOSIS — N390 Urinary tract infection, site not specified: Secondary | ICD-10-CM | POA: Diagnosis not present

## 2017-12-12 DIAGNOSIS — R339 Retention of urine, unspecified: Secondary | ICD-10-CM | POA: Diagnosis not present

## 2017-12-12 DIAGNOSIS — N319 Neuromuscular dysfunction of bladder, unspecified: Secondary | ICD-10-CM | POA: Diagnosis not present

## 2017-12-13 NOTE — Discharge Summary (Signed)
Physician Discharge Summary  Patient ID: YURI FLENER MRN: [id number] DOB/AGE: [birth year] 67 y.o.  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 EMS brought her in for CODE stroke. Patient seen to have seizure like activity in the ED and intubated.  Seen by neurology. CTH normal. Sedation was turned off and patient was moving all ext L>R. Family states patient frequently goes into medicine cabinet and takes medications she is not supposed to.  Patient was hypertensive on arrival to ED with Sbp>200 which was rapidly corrected with repeat BP of 130.  Admitted for possible seizures secondary to urosepsis.  Neurology was consulted and recommended increasing the dilantin dose. Her urine cultures grew multiple species and blood cultures were negative. Urology was consulted to manage the [facility]. They recommended Foley change and CT to evaluate for renal stones.  She was extubated 12/18, tolerated well. She has agitation requiring precedex briefly.  On 12/19 she got off precedex. Her mental status was clear and she was alert She demanded to go home and leave AMA.  I spent some time talking with the patient and explained that we recommend that she stay in the hospital a few more days until she is stable to go home However she is insistent that she wants to leave. She is competent to make decisions and signed AMA paperwork  I called in a prescription for increased Dilantin dose 230 mg qHS and for Levaquin 500 mg a day for 5 days for treatment  of UTI to her pharmacy [facility] [place] Discussed with Dr. Lovena Neighbours, Urology. We changed Foley's before she left I told the patient about the recommendation is to get a CT stone study.  She said she will follow-up with her outpatient urologist regarding this.  Consults: Neurology, Urology  Disposition: 07-Left Against Medical Advice/Left Without Being Seen/Elopement   Allergies as of 11/27/2017      Reactions   Codeine Nausea And Vomiting   REACTION: intolerance   Latex    itching      Medication List    ASK your doctor about these medications   atorvastatin 10 MG tablet Commonly known as:  LIPITOR Take 10 mg by mouth daily.   carvedilol 12.5 MG tablet Commonly known as:  COREG   citalopram 40 MG tablet Commonly known as:  CELEXA Take 1 tablet (40 mg total) by mouth daily.   clopidogrel 75 MG tablet Commonly known as:  PLAVIX Take 75 mg by mouth daily.   donepezil 10 MG tablet Commonly known as:  ARICEPT TAKE 1 BY MOUTH AT BEDTIME   ibuprofen 200 MG tablet Commonly known as:  ADVIL,MOTRIN Take 400 mg by mouth every 6 (six) hours as needed for moderate pain.   LORazepam 0.5 MG tablet Commonly known as:  ATIVAN Take one tablet 30 min. prior to MRI.  Take 2nd tablet to MRI appt. and repeat if needed.  You must not drink alcohol, drive, or operate dangerous equipment when taking this medication.   memantine 10 MG tablet Commonly known as:  NAMENDA Take 1 tablet (10 mg total) by  mouth 2 (two) times daily.   oxycodone 5 MG capsule Commonly known as:  OXY-IR Take 5 mg by mouth every 6 (six) hours as needed for pain.   phenytoin 100 MG ER capsule Commonly known as:  DILANTIN Take 2 capsules (200 mg total) by mouth at bedtime.   SLEEP AID PO Take 1 tablet by mouth as needed (Sleep).        Signed: Kathren Scearce 12/13/2017, 9:25 AM

## 2017-12-16 DIAGNOSIS — R0602 Shortness of breath: Secondary | ICD-10-CM | POA: Diagnosis not present

## 2017-12-16 DIAGNOSIS — N390 Urinary tract infection, site not specified: Secondary | ICD-10-CM | POA: Diagnosis not present

## 2017-12-16 DIAGNOSIS — J449 Chronic obstructive pulmonary disease, unspecified: Secondary | ICD-10-CM | POA: Diagnosis not present

## 2017-12-16 DIAGNOSIS — I350 Nonrheumatic aortic (valve) stenosis: Secondary | ICD-10-CM | POA: Diagnosis not present

## 2017-12-26 DIAGNOSIS — N390 Urinary tract infection, site not specified: Secondary | ICD-10-CM | POA: Diagnosis not present

## 2017-12-31 DIAGNOSIS — R410 Disorientation, unspecified: Secondary | ICD-10-CM | POA: Diagnosis not present

## 2017-12-31 DIAGNOSIS — N390 Urinary tract infection, site not specified: Secondary | ICD-10-CM | POA: Diagnosis not present

## 2017-12-31 DIAGNOSIS — A419 Sepsis, unspecified organism: Secondary | ICD-10-CM | POA: Diagnosis not present

## 2017-12-31 DIAGNOSIS — R51 Headache: Secondary | ICD-10-CM | POA: Diagnosis not present

## 2018-01-13 DIAGNOSIS — N319 Neuromuscular dysfunction of bladder, unspecified: Secondary | ICD-10-CM | POA: Diagnosis not present

## 2018-01-13 DIAGNOSIS — N1330 Unspecified hydronephrosis: Secondary | ICD-10-CM | POA: Diagnosis not present

## 2018-01-13 DIAGNOSIS — N390 Urinary tract infection, site not specified: Secondary | ICD-10-CM | POA: Diagnosis not present

## 2018-01-14 DIAGNOSIS — E7800 Pure hypercholesterolemia, unspecified: Secondary | ICD-10-CM | POA: Diagnosis not present

## 2018-01-14 DIAGNOSIS — N135 Crossing vessel and stricture of ureter without hydronephrosis: Secondary | ICD-10-CM | POA: Diagnosis not present

## 2018-01-14 DIAGNOSIS — Z79899 Other long term (current) drug therapy: Secondary | ICD-10-CM | POA: Diagnosis not present

## 2018-01-14 DIAGNOSIS — J449 Chronic obstructive pulmonary disease, unspecified: Secondary | ICD-10-CM | POA: Diagnosis not present

## 2018-01-14 DIAGNOSIS — Z86718 Personal history of other venous thrombosis and embolism: Secondary | ICD-10-CM | POA: Diagnosis not present

## 2018-01-14 DIAGNOSIS — R69 Illness, unspecified: Secondary | ICD-10-CM | POA: Diagnosis not present

## 2018-01-14 DIAGNOSIS — Z7902 Long term (current) use of antithrombotics/antiplatelets: Secondary | ICD-10-CM | POA: Diagnosis not present

## 2018-01-14 DIAGNOSIS — N390 Urinary tract infection, site not specified: Secondary | ICD-10-CM | POA: Diagnosis not present

## 2018-01-14 DIAGNOSIS — N319 Neuromuscular dysfunction of bladder, unspecified: Secondary | ICD-10-CM | POA: Diagnosis not present

## 2018-01-14 DIAGNOSIS — N131 Hydronephrosis with ureteral stricture, not elsewhere classified: Secondary | ICD-10-CM | POA: Diagnosis not present

## 2018-02-17 DIAGNOSIS — R339 Retention of urine, unspecified: Secondary | ICD-10-CM | POA: Diagnosis not present

## 2018-02-17 DIAGNOSIS — N390 Urinary tract infection, site not specified: Secondary | ICD-10-CM | POA: Diagnosis not present

## 2018-02-17 DIAGNOSIS — N319 Neuromuscular dysfunction of bladder, unspecified: Secondary | ICD-10-CM | POA: Diagnosis not present

## 2018-03-18 DIAGNOSIS — Z8673 Personal history of transient ischemic attack (TIA), and cerebral infarction without residual deficits: Secondary | ICD-10-CM | POA: Diagnosis not present

## 2018-03-18 DIAGNOSIS — I129 Hypertensive chronic kidney disease with stage 1 through stage 4 chronic kidney disease, or unspecified chronic kidney disease: Secondary | ICD-10-CM | POA: Diagnosis not present

## 2018-03-18 DIAGNOSIS — R569 Unspecified convulsions: Secondary | ICD-10-CM | POA: Diagnosis not present

## 2018-03-18 DIAGNOSIS — N189 Chronic kidney disease, unspecified: Secondary | ICD-10-CM | POA: Diagnosis not present

## 2018-03-18 DIAGNOSIS — R69 Illness, unspecified: Secondary | ICD-10-CM | POA: Diagnosis not present

## 2018-03-18 DIAGNOSIS — G309 Alzheimer's disease, unspecified: Secondary | ICD-10-CM | POA: Diagnosis not present

## 2018-03-18 DIAGNOSIS — N3000 Acute cystitis without hematuria: Secondary | ICD-10-CM | POA: Diagnosis not present

## 2018-03-18 DIAGNOSIS — R404 Transient alteration of awareness: Secondary | ICD-10-CM | POA: Diagnosis not present

## 2018-03-18 DIAGNOSIS — D631 Anemia in chronic kidney disease: Secondary | ICD-10-CM | POA: Diagnosis not present

## 2018-03-18 DIAGNOSIS — J449 Chronic obstructive pulmonary disease, unspecified: Secondary | ICD-10-CM | POA: Diagnosis not present

## 2018-04-17 DIAGNOSIS — B078 Other viral warts: Secondary | ICD-10-CM | POA: Diagnosis not present

## 2018-04-17 DIAGNOSIS — E46 Unspecified protein-calorie malnutrition: Secondary | ICD-10-CM | POA: Diagnosis not present

## 2018-04-17 DIAGNOSIS — K7460 Unspecified cirrhosis of liver: Secondary | ICD-10-CM | POA: Diagnosis not present

## 2018-04-17 DIAGNOSIS — Z681 Body mass index (BMI) 19 or less, adult: Secondary | ICD-10-CM | POA: Diagnosis not present

## 2018-04-21 DIAGNOSIS — Z792 Long term (current) use of antibiotics: Secondary | ICD-10-CM | POA: Diagnosis not present

## 2018-04-21 DIAGNOSIS — Z79899 Other long term (current) drug therapy: Secondary | ICD-10-CM | POA: Diagnosis not present

## 2018-04-21 DIAGNOSIS — J449 Chronic obstructive pulmonary disease, unspecified: Secondary | ICD-10-CM | POA: Diagnosis not present

## 2018-04-21 DIAGNOSIS — I129 Hypertensive chronic kidney disease with stage 1 through stage 4 chronic kidney disease, or unspecified chronic kidney disease: Secondary | ICD-10-CM | POA: Diagnosis not present

## 2018-04-21 DIAGNOSIS — L03113 Cellulitis of right upper limb: Secondary | ICD-10-CM | POA: Diagnosis not present

## 2018-04-21 DIAGNOSIS — S61204A Unspecified open wound of right ring finger without damage to nail, initial encounter: Secondary | ICD-10-CM | POA: Diagnosis not present

## 2018-04-21 DIAGNOSIS — N183 Chronic kidney disease, stage 3 (moderate): Secondary | ICD-10-CM | POA: Diagnosis not present

## 2018-04-22 DIAGNOSIS — N319 Neuromuscular dysfunction of bladder, unspecified: Secondary | ICD-10-CM | POA: Diagnosis not present

## 2018-04-22 DIAGNOSIS — N390 Urinary tract infection, site not specified: Secondary | ICD-10-CM | POA: Diagnosis not present

## 2018-04-22 DIAGNOSIS — R339 Retention of urine, unspecified: Secondary | ICD-10-CM | POA: Diagnosis not present

## 2018-04-22 DIAGNOSIS — L239 Allergic contact dermatitis, unspecified cause: Secondary | ICD-10-CM | POA: Diagnosis not present

## 2018-04-29 DIAGNOSIS — L209 Atopic dermatitis, unspecified: Secondary | ICD-10-CM | POA: Diagnosis not present

## 2018-05-07 ENCOUNTER — Telehealth: Payer: Self-pay | Admitting: *Deleted

## 2018-05-07 ENCOUNTER — Ambulatory Visit: Payer: Medicare Other | Admitting: Adult Health

## 2018-05-07 NOTE — Telephone Encounter (Signed)
Patient was no show for follow up with NP today.  

## 2018-05-08 ENCOUNTER — Encounter: Payer: Self-pay | Admitting: Adult Health

## 2018-05-16 NOTE — Telephone Encounter (Signed)
Noted.  Has r/s appt for 10/2018.

## 2018-05-16 NOTE — Telephone Encounter (Signed)
Pt called to r/s her appt, stating her husband passed away the day of the appt in reason why she missed.

## 2018-06-01 ENCOUNTER — Other Ambulatory Visit: Payer: Self-pay | Admitting: Pulmonary Disease

## 2018-06-02 DIAGNOSIS — N390 Urinary tract infection, site not specified: Secondary | ICD-10-CM | POA: Diagnosis not present

## 2018-06-03 DIAGNOSIS — Z79899 Other long term (current) drug therapy: Secondary | ICD-10-CM | POA: Diagnosis not present

## 2018-06-03 DIAGNOSIS — E860 Dehydration: Secondary | ICD-10-CM | POA: Diagnosis not present

## 2018-06-03 DIAGNOSIS — G40909 Epilepsy, unspecified, not intractable, without status epilepticus: Secondary | ICD-10-CM | POA: Diagnosis not present

## 2018-06-03 DIAGNOSIS — Z681 Body mass index (BMI) 19 or less, adult: Secondary | ICD-10-CM | POA: Diagnosis not present

## 2018-06-05 DIAGNOSIS — G309 Alzheimer's disease, unspecified: Secondary | ICD-10-CM | POA: Diagnosis not present

## 2018-06-05 DIAGNOSIS — J449 Chronic obstructive pulmonary disease, unspecified: Secondary | ICD-10-CM | POA: Diagnosis not present

## 2018-06-05 DIAGNOSIS — R0602 Shortness of breath: Secondary | ICD-10-CM | POA: Diagnosis not present

## 2018-06-05 DIAGNOSIS — R69 Illness, unspecified: Secondary | ICD-10-CM | POA: Diagnosis not present

## 2018-06-05 DIAGNOSIS — K922 Gastrointestinal hemorrhage, unspecified: Secondary | ICD-10-CM | POA: Diagnosis not present

## 2018-06-05 DIAGNOSIS — D649 Anemia, unspecified: Secondary | ICD-10-CM | POA: Diagnosis not present

## 2018-06-06 ENCOUNTER — Observation Stay (HOSPITAL_COMMUNITY)
Admission: AD | Admit: 2018-06-06 | Discharge: 2018-06-09 | Disposition: A | Discharge status: Home / Self Care | Payer: Medicare HMO | Source: Other Acute Inpatient Hospital | Attending: Internal Medicine | Admitting: Internal Medicine

## 2018-06-06 ENCOUNTER — Observation Stay (HOSPITAL_BASED_OUTPATIENT_CLINIC_OR_DEPARTMENT_OTHER): Payer: Medicare HMO

## 2018-06-06 ENCOUNTER — Encounter (HOSPITAL_COMMUNITY): Payer: Self-pay | Admitting: Internal Medicine

## 2018-06-06 DIAGNOSIS — F05 Delirium due to known physiological condition: Secondary | ICD-10-CM

## 2018-06-06 DIAGNOSIS — R112 Nausea with vomiting, unspecified: Secondary | ICD-10-CM

## 2018-06-06 DIAGNOSIS — I10 Essential (primary) hypertension: Secondary | ICD-10-CM | POA: Diagnosis present

## 2018-06-06 DIAGNOSIS — Z95820 Peripheral vascular angioplasty status with implants and grafts: Secondary | ICD-10-CM | POA: Insufficient documentation

## 2018-06-06 DIAGNOSIS — I129 Hypertensive chronic kidney disease with stage 1 through stage 4 chronic kidney disease, or unspecified chronic kidney disease: Secondary | ICD-10-CM | POA: Diagnosis not present

## 2018-06-06 DIAGNOSIS — R197 Diarrhea, unspecified: Secondary | ICD-10-CM | POA: Diagnosis not present

## 2018-06-06 DIAGNOSIS — R0789 Other chest pain: Secondary | ICD-10-CM | POA: Diagnosis present

## 2018-06-06 DIAGNOSIS — Z7901 Long term (current) use of anticoagulants: Secondary | ICD-10-CM | POA: Diagnosis not present

## 2018-06-06 DIAGNOSIS — E785 Hyperlipidemia, unspecified: Secondary | ICD-10-CM | POA: Diagnosis present

## 2018-06-06 DIAGNOSIS — N179 Acute kidney failure, unspecified: Secondary | ICD-10-CM | POA: Insufficient documentation

## 2018-06-06 DIAGNOSIS — I080 Rheumatic disorders of both mitral and aortic valves: Secondary | ICD-10-CM | POA: Diagnosis not present

## 2018-06-06 DIAGNOSIS — G309 Alzheimer's disease, unspecified: Secondary | ICD-10-CM | POA: Diagnosis not present

## 2018-06-06 DIAGNOSIS — F0280 Dementia in other diseases classified elsewhere without behavioral disturbance: Secondary | ICD-10-CM | POA: Diagnosis not present

## 2018-06-06 DIAGNOSIS — G300 Alzheimer's disease with early onset: Secondary | ICD-10-CM

## 2018-06-06 DIAGNOSIS — R69 Illness, unspecified: Secondary | ICD-10-CM | POA: Diagnosis not present

## 2018-06-06 DIAGNOSIS — N390 Urinary tract infection, site not specified: Secondary | ICD-10-CM | POA: Insufficient documentation

## 2018-06-06 DIAGNOSIS — Z87442 Personal history of urinary calculi: Secondary | ICD-10-CM | POA: Diagnosis not present

## 2018-06-06 DIAGNOSIS — I5030 Unspecified diastolic (congestive) heart failure: Secondary | ICD-10-CM | POA: Insufficient documentation

## 2018-06-06 DIAGNOSIS — R410 Disorientation, unspecified: Secondary | ICD-10-CM

## 2018-06-06 DIAGNOSIS — R079 Chest pain, unspecified: Secondary | ICD-10-CM

## 2018-06-06 DIAGNOSIS — I340 Nonrheumatic mitral (valve) insufficiency: Secondary | ICD-10-CM

## 2018-06-06 DIAGNOSIS — J449 Chronic obstructive pulmonary disease, unspecified: Secondary | ICD-10-CM | POA: Diagnosis not present

## 2018-06-06 DIAGNOSIS — F17210 Nicotine dependence, cigarettes, uncomplicated: Secondary | ICD-10-CM | POA: Diagnosis not present

## 2018-06-06 DIAGNOSIS — G4700 Insomnia, unspecified: Secondary | ICD-10-CM | POA: Insufficient documentation

## 2018-06-06 DIAGNOSIS — N183 Chronic kidney disease, stage 3 (moderate): Secondary | ICD-10-CM | POA: Diagnosis not present

## 2018-06-06 DIAGNOSIS — Z9889 Other specified postprocedural states: Secondary | ICD-10-CM | POA: Insufficient documentation

## 2018-06-06 DIAGNOSIS — Z87440 Personal history of urinary (tract) infections: Secondary | ICD-10-CM | POA: Diagnosis not present

## 2018-06-06 DIAGNOSIS — Z8614 Personal history of Methicillin resistant Staphylococcus aureus infection: Secondary | ICD-10-CM | POA: Insufficient documentation

## 2018-06-06 DIAGNOSIS — G40301 Generalized idiopathic epilepsy and epileptic syndromes, not intractable, with status epilepticus: Secondary | ICD-10-CM

## 2018-06-06 DIAGNOSIS — Z885 Allergy status to narcotic agent status: Secondary | ICD-10-CM | POA: Insufficient documentation

## 2018-06-06 DIAGNOSIS — F5101 Primary insomnia: Secondary | ICD-10-CM

## 2018-06-06 DIAGNOSIS — G40909 Epilepsy, unspecified, not intractable, without status epilepticus: Secondary | ICD-10-CM | POA: Insufficient documentation

## 2018-06-06 DIAGNOSIS — G40801 Other epilepsy, not intractable, with status epilepticus: Secondary | ICD-10-CM

## 2018-06-06 DIAGNOSIS — I351 Nonrheumatic aortic (valve) insufficiency: Secondary | ICD-10-CM | POA: Diagnosis not present

## 2018-06-06 DIAGNOSIS — Z90710 Acquired absence of both cervix and uterus: Secondary | ICD-10-CM | POA: Insufficient documentation

## 2018-06-06 DIAGNOSIS — K921 Melena: Secondary | ICD-10-CM | POA: Diagnosis not present

## 2018-06-06 DIAGNOSIS — I739 Peripheral vascular disease, unspecified: Secondary | ICD-10-CM | POA: Diagnosis not present

## 2018-06-06 DIAGNOSIS — Z91040 Latex allergy status: Secondary | ICD-10-CM | POA: Insufficient documentation

## 2018-06-06 DIAGNOSIS — Z79899 Other long term (current) drug therapy: Secondary | ICD-10-CM | POA: Insufficient documentation

## 2018-06-06 DIAGNOSIS — Z9049 Acquired absence of other specified parts of digestive tract: Secondary | ICD-10-CM | POA: Insufficient documentation

## 2018-06-06 DIAGNOSIS — Z89422 Acquired absence of other left toe(s): Secondary | ICD-10-CM | POA: Insufficient documentation

## 2018-06-06 HISTORY — DX: Hyperlipidemia, unspecified: E78.5

## 2018-06-06 HISTORY — DX: Essential (primary) hypertension: I10

## 2018-06-06 LAB — CBC
HCT: 31.3 % — ABNORMAL LOW (ref 36.0–46.0)
HCT: 31.6 % — ABNORMAL LOW (ref 36.0–46.0)
Hemoglobin: 9 g/dL — ABNORMAL LOW (ref 12.0–15.0)
Hemoglobin: 9.2 g/dL — ABNORMAL LOW (ref 12.0–15.0)
MCH: 28.2 pg (ref 26.0–34.0)
MCH: 28.3 pg (ref 26.0–34.0)
MCHC: 28.8 g/dL — ABNORMAL LOW (ref 30.0–36.0)
MCHC: 29.1 g/dL — ABNORMAL LOW (ref 30.0–36.0)
MCV: 98.1 fL (ref 78.0–100.0)
MCV: 97.2 fL (ref 78.0–100.0)
Platelets: 175 K/uL (ref 150–400)
Platelets: 171 10*3/uL (ref 150–400)
RBC: 3.19 MIL/uL — ABNORMAL LOW (ref 3.87–5.11)
RBC: 3.25 MIL/uL — ABNORMAL LOW (ref 3.87–5.11)
RDW: 14.6 % (ref 11.5–15.5)
RDW: 14.6 % (ref 11.5–15.5)
WBC: 9.2 10*3/uL (ref 4.0–10.5)
WBC: 9.3 10*3/uL (ref 4.0–10.5)

## 2018-06-06 LAB — HEMOGLOBIN AND HEMATOCRIT, BLOOD
HCT: 29.5 % — ABNORMAL LOW (ref 36.0–46.0)
HCT: 30.5 % — ABNORMAL LOW (ref 36.0–46.0)
Hemoglobin: 8.7 g/dL — ABNORMAL LOW (ref 12.0–15.0)
Hemoglobin: 8.7 g/dL — ABNORMAL LOW (ref 12.0–15.0)

## 2018-06-06 LAB — COMPREHENSIVE METABOLIC PANEL
ALBUMIN: 2.8 g/dL — AB (ref 3.5–5.0)
ALT: 8 U/L (ref 0–44)
AST: 10 U/L — AB (ref 15–41)
Alkaline Phosphatase: 128 U/L — ABNORMAL HIGH (ref 38–126)
Anion gap: 6 (ref 5–15)
BILIRUBIN TOTAL: 0.3 mg/dL (ref 0.3–1.2)
BUN: 29 mg/dL — AB (ref 8–23)
CHLORIDE: 123 mmol/L — AB (ref 98–111)
CO2: 11 mmol/L — ABNORMAL LOW (ref 22–32)
CREATININE: 1.69 mg/dL — AB (ref 0.44–1.00)
Calcium: 7.8 mg/dL — ABNORMAL LOW (ref 8.9–10.3)
GFR calc Af Amer: 35 mL/min — ABNORMAL LOW (ref >60)
GFR calc non Af Amer: 30 mL/min — ABNORMAL LOW (ref >60)
GLUCOSE: 85 mg/dL (ref 70–99)
Potassium: 4.6 mmol/L (ref 3.5–5.1)
Sodium: 140 mmol/L (ref 135–145)
Total Protein: 5.7 g/dL — ABNORMAL LOW (ref 6.5–8.1)

## 2018-06-06 LAB — TYPE AND SCREEN
ABO/RH(D): A POS
ANTIBODY SCREEN: NEGATIVE

## 2018-06-06 LAB — ECHOCARDIOGRAM COMPLETE
Height: 61 in
Weight: 1398.4 [oz_av]

## 2018-06-06 LAB — TROPONIN I
Troponin I: <0.03 ng/mL (ref <0.03)
Troponin I: <0.03 ng/mL (ref <0.03)
Troponin I: <0.03 ng/mL (ref <0.03)

## 2018-06-06 LAB — HIV ANTIBODY (ROUTINE TESTING W REFLEX): HIV Screen 4th Generation wRfx: NONREACTIVE

## 2018-06-06 MED ORDER — SODIUM CHLORIDE 0.9 % IV SOLN
INTRAVENOUS | Status: DC
  Administered 2018-06-06 – 2018-06-07 (×2): via INTRAVENOUS

## 2018-06-06 MED ORDER — PHENYTOIN SODIUM EXTENDED 100 MG PO CAPS
100.0000 mg | ORAL_CAPSULE | Freq: Two times a day (BID) | ORAL | Status: DC
  Administered 2018-06-06 – 2018-06-09 (×7): 100 mg via ORAL
  Filled 2018-06-06 (×7): qty 1

## 2018-06-06 MED ORDER — TRAMADOL HCL 50 MG PO TABS
50.0000 mg | ORAL_TABLET | Freq: Two times a day (BID) | ORAL | Status: DC | PRN
  Administered 2018-06-06 – 2018-06-07 (×2): 50 mg via ORAL
  Filled 2018-06-06 (×2): qty 1

## 2018-06-06 MED ORDER — MEMANTINE HCL 10 MG PO TABS
10.0000 mg | ORAL_TABLET | Freq: Two times a day (BID) | ORAL | Status: DC
  Administered 2018-06-06 – 2018-06-09 (×7): 10 mg via ORAL
  Filled 2018-06-06 (×7): qty 1

## 2018-06-06 MED ORDER — CITALOPRAM HYDROBROMIDE 40 MG PO TABS
40.0000 mg | ORAL_TABLET | Freq: Every day | ORAL | Status: DC
  Administered 2018-06-06 – 2018-06-09 (×4): 40 mg via ORAL
  Filled 2018-06-06 (×4): qty 1

## 2018-06-06 MED ORDER — ADULT MULTIVITAMIN W/MINERALS CH
1.0000 | ORAL_TABLET | Freq: Every day | ORAL | Status: DC
  Administered 2018-06-06 – 2018-06-09 (×4): 1 via ORAL
  Filled 2018-06-06 (×4): qty 1

## 2018-06-06 MED ORDER — FENTANYL CITRATE (PF) 100 MCG/2ML IJ SOLN
25.0000 ug | INTRAMUSCULAR | Status: DC | PRN
Start: 2018-06-06 — End: 2018-06-09
  Administered 2018-06-08 – 2018-06-09 (×3): 25 ug via INTRAVENOUS
  Filled 2018-06-06 (×3): qty 2

## 2018-06-06 MED ORDER — PHENYTOIN SODIUM EXTENDED 100 MG PO CAPS: 200.0000 mg | ORAL_CAPSULE | Freq: Every day | ORAL | Status: DC

## 2018-06-06 MED ORDER — CLOPIDOGREL BISULFATE 75 MG PO TABS
75.0000 mg | ORAL_TABLET | Freq: Every day | ORAL | Status: DC
  Administered 2018-06-06 – 2018-06-09 (×4): 75 mg via ORAL
  Filled 2018-06-06 (×4): qty 1

## 2018-06-06 MED ORDER — CARVEDILOL 12.5 MG PO TABS
12.5000 mg | ORAL_TABLET | Freq: Two times a day (BID) | ORAL | Status: DC
  Administered 2018-06-06 – 2018-06-09 (×7): 12.5 mg via ORAL
  Filled 2018-06-06 (×7): qty 1

## 2018-06-06 MED ORDER — ONDANSETRON HCL 4 MG PO TABS
4.0000 mg | ORAL_TABLET | Freq: Four times a day (QID) | ORAL | Status: DC | PRN
  Administered 2018-06-07 – 2018-06-09 (×2): 4 mg via ORAL
  Filled 2018-06-06 (×2): qty 1

## 2018-06-06 MED ORDER — DONEPEZIL HCL 10 MG PO TABS
10.0000 mg | ORAL_TABLET | Freq: Every day | ORAL | Status: DC
  Administered 2018-06-06 – 2018-06-08 (×3): 10 mg via ORAL
  Filled 2018-06-06 (×3): qty 1

## 2018-06-06 MED ORDER — ENSURE ENLIVE PO LIQD
237.0000 mL | Freq: Three times a day (TID) | ORAL | Status: DC
  Administered 2018-06-06 – 2018-06-08 (×5): 237 mL via ORAL

## 2018-06-06 MED ORDER — TRAMADOL HCL 50 MG PO TABS: 50.0000 mg | ORAL_TABLET | Freq: Four times a day (QID) | ORAL | Status: DC | PRN

## 2018-06-06 MED ORDER — ATORVASTATIN CALCIUM 10 MG PO TABS
10.0000 mg | ORAL_TABLET | Freq: Every day | ORAL | Status: DC
  Administered 2018-06-06 – 2018-06-09 (×4): 10 mg via ORAL
  Filled 2018-06-06 (×4): qty 1

## 2018-06-06 MED ORDER — ONDANSETRON HCL 4 MG/2ML IJ SOLN
4.0000 mg | Freq: Four times a day (QID) | INTRAMUSCULAR | Status: DC | PRN
  Administered 2018-06-08 (×2): 4 mg via INTRAVENOUS
  Filled 2018-06-06 (×3): qty 2

## 2018-06-06 MED ORDER — DOXYLAMINE SUCCINATE (SLEEP) 25 MG PO TABS: 25.0000 mg | ORAL_TABLET | Freq: Every evening | ORAL | Status: DC | PRN

## 2018-06-06 MED ORDER — OXYCODONE HCL 5 MG PO CAPS: 5.0000 mg | ORAL_CAPSULE | Freq: Four times a day (QID) | ORAL | Status: DC | PRN

## 2018-06-06 MED ORDER — OXYCODONE HCL 5 MG PO TABS
2.5000 mg | ORAL_TABLET | Freq: Three times a day (TID) | ORAL | Status: DC | PRN
  Administered 2018-06-06 – 2018-06-09 (×6): 2.5 mg via ORAL
  Filled 2018-06-06 (×5): qty 1

## 2018-06-06 NOTE — Progress Notes (Signed)
  Echocardiogram 2D Echocardiogram has been performed.  Liliana Dang L Androw 06/06/2018, 11:12 AM

## 2018-06-06 NOTE — Progress Notes (Signed)
Initial Nutrition Assessment  DOCUMENTATION CODES:   Severe malnutrition in context of chronic illness, Underweight  INTERVENTION:   -Ensure Enlive po TID, each supplement provides 350 kcal and 20 grams of protein -MVI with minerals daily  NUTRITION DIAGNOSIS:   Severe Malnutrition related to chronic illness(COPD) as evidenced by severe fat depletion, severe muscle depletion, energy intake < or equal to 75% for > or equal to 1 month.  GOAL:   Patient will meet greater than or equal to 90% of their needs  MONITOR:   PO intake, Supplement acceptance, Labs, Weight trends, Skin, I & O's  REASON FOR ASSESSMENT:   Malnutrition Screening Tool    ASSESSMENT:   [name] is a 67 y.o. 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  Pt admitted with diarrhea with blood in stool.   Spoke with pt at bedside, who reports ongoing poor appetite and weight loss over an unspecified periods of time ("a long time ago"). Pt reports UBW is around 103#, however, unsure when she weighed this amount. Intake is very poor at baseline due to lack of appetite- pt reports she consumes one meal per day [employer], which consists of pork chop, starch, and vegetable). Pt also snacks on cookies daily and consumes mainly [facility] and sweet tea. She denies any chewing or swallowing issues. Breakfast tray on tray table was untouched.   Pt reports she is very immobile at baseline. She relies a lot of her son and  granddaughter who live with her. She is unable to stand for long periods of time, due to back issues.   Discussed with pt importance of good nutritional intake to promote healing. Pt reports she has tried Ensure supplements in the past, but recently considered consuming supplements again to optimize nutritional status. Pt amenable to supplements both during hospitalization and at discharge.   Labs reviewed: CBGS: 72-105.  NUTRITION - FOCUSED PHYSICAL EXAM:    Most Recent Value  Orbital Region  Severe depletion  Upper Arm Region  Severe depletion  Thoracic and Lumbar Region  Severe depletion  Buccal Region  Mild depletion  Temple Region  Moderate depletion  Clavicle Bone Region  Severe depletion  Clavicle and Acromion Bone Region  Severe depletion  Scapular Bone Region  Severe depletion  Dorsal Hand  Moderate depletion  Patellar Region  Severe depletion  Anterior Thigh Region  Severe depletion  Posterior Calf Region  Severe depletion  Edema (RD Assessment)  None  Hair  Reviewed  Eyes  Reviewed  Mouth  Reviewed  Skin  Reviewed  Nails  Reviewed       Diet Order:   Diet Order           Diet Heart Room service appropriate? Yes; Fluid consistency: Thin  Diet effective now          EDUCATION NEEDS:   Education needs have been addressed  Skin:  Skin Assessment: Reviewed RN Assessment  Last BM:  06/05/18  Height:   Ht Readings from Last 1 Encounters:  06/06/18 5\' 1"  (1.549 m)    Weight:  Wt Readings from Last 1 Encounters:  06/06/18 87 lb 6.4 oz (39.6 kg)    Ideal Body Weight:  47.7 kg  BMI:  Body mass index is 16.51 kg/m.  Estimated Nutritional Needs:   Kcal:  1500-1700  Protein:  80-95 grams  Fluid:  1.5-1.7 L    Iokepa Geffre A. Jimmye Norman, RD, LDN, CDE Pager: [phone] After hours Pager: [phone]

## 2018-06-06 NOTE — H&P (Signed)
History and Physical    [name] HAL:[id number] DOB: [birth year] DOA: 06/06/2018  PCP: Marco Collie, MD   Patient coming from: home  Chief Complaint: diarrhea, chest pain, AKI, hematochezia  HPI: [name] is a 67 y.o. 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  Patient's history is extremely limited as she is a very poor historian.  She reports that for the past month she has had several episodes of diarrhea.  She does not report having diarrhea overnight but does report having frequent bouts of diarrhea when she eats.  She has noted streaks of blood in her stool as well.  She has noted some diffuse crampy abdominal pain that is waxing and waning.  Pain is primarily over the lower part of her abdomen but does not localize to any one particular spot.  She has not had any nausea or vomiting.  She has not had any fevers.  She denies any recent travel anywhere.  She denies any use of NSAIDs however this is on her medication list and her compliance and knowledge of her own medications is dubious. Patient additionally reports approximately 2 to 3 weeks of vague chest pain.  Pain is primarily underneath the left side of her breast.  Pain is dull and does not radiate.  She reports that pain does come on during exertion and does remit with rest.  The pain lasts for approximately 15 minutes.  She has had some shortness of breath but no syncope, nausea, vomiting, diarrhea.  She does note that she has  chronic lower extremity edema that waxes and wanes.  She has not had any orthopnea or paroxysmal nocturnal dyspnea.  She denies any prior cardiac history but does report that she has had this pain in the past and is been attributed to musculoskeletal pain.  ED Course: Patient was seen in the ED in [place] with the symptoms.  Her labs were fairly unremarkable.  Vitals were stable.  They noted that her stool was fecal occult blood positive.  Hemoglobin was 9.5 with a prior in their system of 10.5 however in our system from approximately 6 months ago it was 7.7.  They requested transfer for GI evaluation.  Otherwise the only notable labs for her creatinine of 1.7 which was slightly higher than her baseline around 1.4-1.5.  Her troponins were negative.  Her chest x-ray was unremarkable.  EKG did not show any acute ST segment changes.  Review of Systems: As per HPI otherwise 10 point review of systems negative.    Past Medical History:  Diagnosis Date  . Alzheimer's dementia    takes Aricept nightly  . Anemia   . COPD (chronic obstructive pulmonary disease) ([place])   . History of blood transfusion    received 2units on Feb 17,2015  . History of MRSA infection    several yrs ago  . HLD (hyperlipidemia) 06/06/2018  . HTN (hypertension) 06/06/2018  . Hyperlipidemia    takes Atorvastatin daily  . Hypertension    takes Amlodipine and Carvedilol daily  . Insomnia    takes Ambien  nightly  . Renal disorder    kidney stones  . Seizures ([place])   . Subacute delirium 10/14/2013   takes Seroquel and Dilantin nightly  . Toxic encephalopathy   . UTI (lower urinary tract infection)    takes Macrodantin daily    Past Surgical History:  Procedure Laterality Date  . ABDOMINAL ANGIOGRAM N/A 01/20/2014   Procedure: ABDOMINAL ANGIOGRAM;  Surgeon: Elam Dutch, MD;  [facility];  Service: Cardiovascular;  Laterality: N/A;  . ABDOMINAL HYSTERECTOMY    . AMPUTATION Left 02/08/2014   Procedure:  AMPUTATION Left 4th & 5th toes;  Surgeon: Conrad Buhl, MD;  Location: [place];  Service: Vascular;  Laterality: Left;  . BLADDER SURGERY    . CHOLECYSTECTOMY  1995   Gall Bladder  . INSERTION OF ILIAC STENT Left 01/26/2014   Procedure: INSERTION OF ILIAC STENT- LEFT COMMON ILIAC ARTERY STENT ;  Surgeon: Conrad Portage, MD;  Location: [place];  Service: Vascular;  Laterality: Left;  . KNEE SURGERY       reports that she has been smoking cigarettes.  She has been smoking about 0.75 packs per day. She has never used smokeless tobacco. She reports that she does not drink alcohol or use drugs.  Allergies  Allergen Reactions  . Codeine Nausea And Vomiting    REACTION: intolerance  . Latex     itching    Family History  Problem Relation Age of Onset  . Cancer Brother      Prior to Admission medications   Medication Sig Start Date End Date Taking? Authorizing Provider  atorvastatin (LIPITOR) 10 MG tablet Take 10 mg by mouth daily.  12/06/13   [provider]  carvedilol (COREG) 12.5 MG tablet  03/26/16   [provider]  citalopram (CELEXA) 40 MG tablet Take 1 tablet (40 mg total) by mouth daily. 11/28/16   Dohmeier, Asencion Partridge, MD  clopidogrel (PLAVIX) 75 MG tablet Take 75 mg by mouth daily.    [provider]  donepezil (ARICEPT) 10 MG tablet TAKE 1 BY MOUTH AT BEDTIME 10/01/17   Dohmeier, Asencion Partridge, MD  Doxylamine Succinate, Sleep, (SLEEP AID PO) Take 1 tablet by mouth as needed (Sleep).    [provider]  ibuprofen (ADVIL,MOTRIN) 200 MG tablet Take 400 mg by mouth every 6 (six) hours as needed for moderate pain.    [provider]  LORazepam (ATIVAN) 0.5 MG tablet Take one tablet 30 min. prior to MRI.  Take 2nd tablet to MRI appt. and repeat if needed.  You must not drink alcohol, drive, or operate dangerous equipment when taking this medication. 11/30/16   Sater, Nanine Means, MD  memantine (NAMENDA) 10 MG tablet Take 1 tablet (10 mg total) by mouth 2  (two) times daily. 11/28/16   Dohmeier, Asencion Partridge, MD  oxycodone (OXY-IR) 5 MG capsule Take 5 mg by mouth every 6 (six) hours as needed for pain.    [provider]  phenytoin (DILANTIN) 100 MG ER capsule Take 2 capsules (200 mg total) by mouth at bedtime. 11/28/16   Dohmeier, Asencion Partridge, MD    Physical Exam: Vitals:   06/06/18 0520  BP: (!) 160/59  Pulse: 63  Resp: 18  Temp: 97.8 F (36.6 C)  SpO2: 100%  Weight: 39.6 kg (87 lb 6.4 oz)  Height: 5\' 1"  (1.549 m)    Constitutional: NAD, calm, comfortable Vitals:   06/06/18 0520  BP: (!) 160/59  Pulse: 63  Resp: 18  Temp: 97.8 F (  36.6 C)  SpO2: 100%  Weight: 39.6 kg (87 lb 6.4 oz)  Height: 5\' 1"  (1.549 m)   Eyes: Anicteric sclera ENMT: Mucous membranes, poor dentition Neck: normal, supple Respiratory: clear to auscultation bilaterally, no wheezing, no crackles.   Cardiovascular: Distant heart sounds, regular rate and rhythm, no murmurs Abdomen: Soft, mildly tender to deep palpation, no rebound or guarding, plus bowel sounds Musculoskeletal: Shiny shins, 1+ pulses bilaterally trace lower extremity edema Skin: no rashes, lesions, ulcers. No induration Neurologic: Grossly intact moving all extremities Psychiatric: Poor judgment and insight. Alert and oriented x 3. Normal mood.     Labs on Admission: I have personally reviewed following labs and imaging studies  CBC: Recent Labs  Lab 06/06/18 0603  WBC 9.2  HGB 9.0*  HCT 31.3*  MCV 98.1  PLT 073   Basic Metabolic Panel: Recent Labs  Lab 06/06/18 0603  NA 140  K 4.6  CL 123*  CO2 11*  GLUCOSE 85  BUN 29*  CREATININE 1.69*  CALCIUM 7.8*   GFR: Estimated Creatinine Clearance: 20.5 mL/min (A) (by C-G formula based on SCr of 1.69 mg/dL (H)). Liver Function Tests: Recent Labs  Lab 06/06/18 0603  AST 10*  ALT 8  ALKPHOS 128*  BILITOT 0.3  PROT 5.7*  ALBUMIN 2.8*   No results for input(s): LIPASE, AMYLASE in the last 168 hours. No results for  input(s): AMMONIA in the last 168 hours. Coagulation Profile: No results for input(s): INR, PROTIME in the last 168 hours. Cardiac Enzymes: No results for input(s): CKTOTAL, CKMB, CKMBINDEX, TROPONINI in the last 168 hours. BNP (last 3 results) No results for input(s): PROBNP in the last 8760 hours. HbA1C: No results for input(s): HGBA1C in the last 72 hours. CBG: No results for input(s): GLUCAP in the last 168 hours. Lipid Profile: No results for input(s): CHOL, HDL, LDLCALC, TRIG, CHOLHDL, LDLDIRECT in the last 72 hours. Thyroid Function Tests: No results for input(s): TSH, T4TOTAL, FREET4, T3FREE, THYROIDAB in the last 72 hours. Anemia Panel: No results for input(s): VITAMINB12, FOLATE, FERRITIN, TIBC, IRON, RETICCTPCT in the last 72 hours. Urine analysis:    Component Value Date/Time   COLORURINE YELLOW 11/22/2017 2141   APPEARANCEUR HAZY (A) 11/22/2017 2141   LABSPEC 1.010 11/22/2017 2141   PHURINE 6.0 11/22/2017 2141   GLUCOSEU NEGATIVE 11/22/2017 2141   HGBUR SMALL (A) 11/22/2017 2141   BILIRUBINUR NEGATIVE 11/22/2017 2141   KETONESUR NEGATIVE 11/22/2017 2141   PROTEINUR NEGATIVE 11/22/2017 2141   UROBILINOGEN 0.2 01/24/2014 0500   NITRITE POSITIVE (A) 11/22/2017 2141   LEUKOCYTESUR LARGE (A) 11/22/2017 2141    Radiological Exams on Admission: No results found.  EKG: Independently reviewed.  Pending  Assessment/Plan Active Problems:   ALZHEIMERS DISEASE   PAD (peripheral artery disease) (HCC)   Seizure disorder (HCC)   Chest pain   Diarrhea   HTN (hypertension)   HLD (hyperlipidemia)    #) Diarrhea with blood in stool: Patient has a history of hemorrhoids.  She does have a fairly extensive GI history though this is predominantly related to her prior hemicolectomy and blind loop/small bacterial overgrowth.  She has been off of rifaximin for some time and it is not clear how her small bowel bacterial overgrowth has not recurred.  Regardless there does not  appear to be any significant amount of blood loss in the stool.  She will likely need a colonoscopy to the timing of this can likely be deferred to outpatient. -Cycle H&H - C. difficile, GI  panel PCR, stool ova and parasites -Gentle IV fluids for 1 day - We will consider GI consult if patient's hemoglobin drops where there is increased concern for small bacterial overgrowth -We will hold on hydrate breath test for small bacterial overgrowth at this time  #) Chest pain: Patient's history is equivocal.  She certainly has significant risk factors including peripheral arterial disease but the urgency of this work-up particularly as it is nonexertional is dubious.  Her EKG is fairly reassuring.  She has no troponins elevated.  Suspect this can be deferred outpatient as well. -Repeat EKG -Cycle troponins -Echo ordered  #) Acute kidney injury on chronic kidney disease stage III: Patient appears to be mildly volume depleted.  Suspect most likely related to diarrhea and decreased p.o. intake. -Gentle IV fluids for 1 day -Avoid nephrotoxins  #) Disease peripheral arterial disease status post stent: -Continue clopidogrel 75 mg daily -Continue atorvastatin 10 mg daily  #) Hypertension/hyperlipidemia: -Continue atorvastatin 10 mg daily -Continue carvedilol 12.5 mg twice daily  #) Dementia: -Continue donepezil 10 mg nightly -Continue amantadine 10 mg twice daily  #) Seizure disorder: -Continue extended release phenytoin 200 mg nightly  Fluids: Gentle IV fluids Electrodes: Monitor and supplement Nutrition: Heart healthy diet  Prophylaxis: SCDs  Disposition: Pending cycling of hemoglobin and monitoring overnight and echo  Full code     Cristy Folks MD [facility]   If 7PM-7AM, please contact night-coverage [email or web address] Password [email or web address]  06/06/2018, 8:00 AM

## 2018-06-07 DIAGNOSIS — G300 Alzheimer's disease with early onset: Secondary | ICD-10-CM

## 2018-06-07 DIAGNOSIS — N390 Urinary tract infection, site not specified: Secondary | ICD-10-CM | POA: Diagnosis not present

## 2018-06-07 DIAGNOSIS — I10 Essential (primary) hypertension: Secondary | ICD-10-CM

## 2018-06-07 DIAGNOSIS — F0280 Dementia in other diseases classified elsewhere without behavioral disturbance: Secondary | ICD-10-CM

## 2018-06-07 DIAGNOSIS — R69 Illness, unspecified: Secondary | ICD-10-CM | POA: Diagnosis not present

## 2018-06-07 DIAGNOSIS — N179 Acute kidney failure, unspecified: Secondary | ICD-10-CM | POA: Diagnosis not present

## 2018-06-07 DIAGNOSIS — G40909 Epilepsy, unspecified, not intractable, without status epilepticus: Secondary | ICD-10-CM

## 2018-06-07 DIAGNOSIS — E872 Acidosis: Secondary | ICD-10-CM | POA: Diagnosis not present

## 2018-06-07 DIAGNOSIS — R112 Nausea with vomiting, unspecified: Secondary | ICD-10-CM | POA: Diagnosis not present

## 2018-06-07 DIAGNOSIS — R197 Diarrhea, unspecified: Secondary | ICD-10-CM | POA: Diagnosis not present

## 2018-06-07 LAB — CBC
HCT: 29.8 % — ABNORMAL LOW (ref 36.0–46.0)
Hemoglobin: 8.8 g/dL — ABNORMAL LOW (ref 12.0–15.0)
MCH: 28.9 pg (ref 26.0–34.0)
MCHC: 29.5 g/dL — ABNORMAL LOW (ref 30.0–36.0)
MCV: 97.7 fL (ref 78.0–100.0)
Platelets: 166 K/uL (ref 150–400)
RBC: 3.05 MIL/uL — ABNORMAL LOW (ref 3.87–5.11)
RDW: 14.4 % (ref 11.5–15.5)
WBC: 8.9 K/uL (ref 4.0–10.5)

## 2018-06-07 LAB — BASIC METABOLIC PANEL WITH GFR
BUN: 27 mg/dL — ABNORMAL HIGH (ref 8–23)
Creatinine, Ser: 1.53 mg/dL — ABNORMAL HIGH (ref 0.44–1.00)
GFR calc non Af Amer: 34 mL/min — ABNORMAL LOW (ref >60)
Sodium: 140 mmol/L (ref 135–145)

## 2018-06-07 LAB — BASIC METABOLIC PANEL
ANION GAP: 5 (ref 5–15)
Anion gap: 6 (ref 5–15)
BUN: 24 mg/dL — ABNORMAL HIGH (ref 8–23)
CALCIUM: 7.8 mg/dL — AB (ref 8.9–10.3)
CO2: 11 mmol/L — ABNORMAL LOW (ref 22–32)
CO2: 20 mmol/L — ABNORMAL LOW (ref 22–32)
CREATININE: 1.49 mg/dL — AB (ref 0.44–1.00)
Calcium: 8.1 mg/dL — ABNORMAL LOW (ref 8.9–10.3)
Chloride: 123 mmol/L — ABNORMAL HIGH (ref 98–111)
Chloride: 115 mmol/L — ABNORMAL HIGH (ref 98–111)
GFR calc Af Amer: 40 mL/min — ABNORMAL LOW (ref >60)
GFR, EST AFRICAN AMERICAN: 41 mL/min — AB (ref >60)
GFR, EST NON AFRICAN AMERICAN: 35 mL/min — AB (ref >60)
Glucose, Bld: 87 mg/dL (ref 70–99)
Glucose, Bld: 90 mg/dL (ref 70–99)
Potassium: 4.5 mmol/L (ref 3.5–5.1)
Potassium: 4.3 mmol/L (ref 3.5–5.1)
Sodium: 140 mmol/L (ref 135–145)

## 2018-06-07 MED ORDER — SODIUM BICARBONATE 8.4 % IV SOLN
INTRAVENOUS | Status: DC
  Administered 2018-06-07 (×2): via INTRAVENOUS
  Filled 2018-06-07 (×4): qty 150

## 2018-06-07 NOTE — Care Management Obs Status (Signed)
[facility] NOTIFICATION   Patient Details  Name: [name] MRN: [id number] Date of Birth: 01-10-1951   Medicare Observation Status Notification Given:  Yes    Carles Collet, RN 06/07/2018, 10:10 AM

## 2018-06-07 NOTE — Progress Notes (Signed)
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  Due to her dementia history is limited.  Patient was found to have mildly elevated creatinine compared to her baseline.  She was admitted for further work-up.  Reason for Visit: Chest pain.  Diarrhea  Consultants: None  Procedures:   Transthoracic echocardiogram Study Conclusions  - Left ventricle: The cavity size was normal. Wall thickness was   normal. Systolic function was normal. The estimated ejection   fraction was in the range of 60% to 65%. Wall motion was normal;   there were no regional wall motion abnormalities. Doppler   parameters are consistent with abnormal left ventricular   relaxation (grade 1 diastolic dysfunction). - Aortic valve: There was mild regurgitation. Valve area (VTI):   1.99 cm^2. Valve area (Vmax): 1.97 cm^2. Valve area (Vmean): 1.76   cm^2. - Mitral valve: There was mild regurgitation.   Antibiotics: None  Subjective/Interval History: Patient states that she feels well.  She denies any further episodes of diarrhea since she has been in the hospital.  Denies any chest pain whatsoever today.  No shortness of breath.  ROS: Denies Headaches  Objective:  Vital Signs  Vitals:   06/06/18 0520 06/06/18 1215 06/06/18 1624 06/07/18 0827  BP:  (!) 160/59 (!) 148/67 121/80 115/71  Pulse: 63 79  78  Resp: 18     Temp: 97.8 F (36.6 C)     SpO2: 100%     Weight: 39.6 kg (87 lb 6.4 oz)     Height: 5\' 1"  (1.549 m)       Intake/Output Summary (Last 24 hours) at 06/07/2018 1239 Last data filed at 06/07/2018 0900 Gross per 24 hour  Intake 1187.75 ml  Output -  Net 1187.75 ml   Filed Weights   06/06/18 0520  Weight: 39.6 kg (87 lb 6.4 oz)    General appearance: alert, cooperative, appears stated age and no distress Head: Normocephalic, without obvious abnormality, atraumatic Resp: clear to auscultation bilaterally Cardio: regular rate and rhythm, S1, S2 normal, no murmur, click, rub or gallop GI: soft, non-tender; bowel sounds normal; no masses,  no organomegaly Extremities: extremities normal, atraumatic, no cyanosis or edema Skin: Skin color, texture, turgor normal. No rashes or lesions Neurologic: Noted to be mildly distracted.  No facial asymmetry.  Tongue is midline.  Motor strength equal bilateral upper and lower extremities.  Lab Results:  Data Reviewed: I have personally reviewed following labs and imaging studies  CBC: Recent Labs  Lab 06/06/18 0603 06/06/18 0817 06/06/18 1339 06/06/18 2116 06/07/18 0530  WBC 9.2 9.3  --   --  8.9  HGB 9.0* 9.2* 8.7* 8.7* 8.8*  HCT 31.3* 31.6* 29.5* 30.5* 29.8*  MCV 98.1 97.2  --   --  97.7  PLT 175 171  --   --  194    Basic Metabolic Panel: Recent Labs  Lab 06/06/18 0603 06/07/18 0530  NA 140 140  K 4.6 4.5  CL  123* 123*  CO2 11* 11*  GLUCOSE 85 87  BUN 29* 27*  CREATININE 1.69* 1.53*  CALCIUM 7.8* 8.1*    GFR: Estimated Creatinine Clearance: 22.6 mL/min (A) (by C-G formula based on SCr of 1.53 mg/dL (H)).  Liver Function Tests: Recent Labs  Lab 06/06/18 0603  AST 10*  ALT 8  ALKPHOS 128*  BILITOT 0.3  PROT 5.7*  ALBUMIN 2.8*    Cardiac Enzymes: Recent Labs  Lab 06/06/18 0817 06/06/18 1339 06/06/18 2116  TROPONINI <0.03 <0.03 <0.03      Radiology Studies: No results found.   Medications:  Scheduled: . atorvastatin  10 mg Oral Daily  . carvedilol  12.5 mg Oral BID WC  . citalopram  40 mg Oral Daily  . clopidogrel  75 mg Oral Daily  . donepezil  10 mg Oral QHS  . feeding supplement (ENSURE ENLIVE)  237 mL Oral TID BM  . memantine  10 mg Oral BID  . multivitamin with minerals  1 tablet Oral Daily  . phenytoin  100 mg Oral BID   Continuous: .  sodium bicarbonate  infusion 1000 mL 100 mL/hr at 06/07/18 1038   ERD:EYCXKGYJ (SUBLIMAZE) injection, ondansetron **OR** ondansetron (ZOFRAN) IV, oxyCODONE, traMADol  Assessment/Plan:    Diarrhea with concern for hematochezia Patient has not had any further episodes of diarrhea in the hospital.  Her hemoglobin is stable compared to her baseline.  Patient does have an extensive GI history as mentioned under HPI.  C. difficile was considered however since she has not had any diarrhea this remains unlikely.  Continue to watch for another day.  No clear reason to consult gastroenterology.  Acute kidney injury on CKD stage III Creatinine noted to be mildly higher than her baseline.  Patient given IV fluids.  AKI is most likely due to GI volume loss.  Monitor urine output.  Continue hydration for another day.  Non-anion gap metabolic acidosis Bicarbonate level noted to be low.  Likely due to GI loss.  Previous labs reviewed.  Bicarbonate has been low previously around 15 or so.  Noted to be 11 this morning.  We will give her bicarbonate infusion for the next 24 hours.  Recheck labs later today.  Chest pain Somewhat atypical presentation.  EKG was unremarkable.  Troponins are normal.  No chest pain this morning.  Echocardiogram does not show any wall motion abnormalities.  No further work-up at this time.  History of peripheral arterial disease She is status post stent.  Continue home medications including statin and Plavix.  Essential hypertension/hyperlipidemia Stable.   Continue home medications.  History of dementia Continue home medications including donepezil as well as amantadine.  History of seizure disorder Continue phenytoin.  DVT Prophylaxis: SCDs    Code Status: Full code Family Communication: Discussed with the patient.  No family at bedside Disposition Plan: Management as outlined above. Anticipate discharge tomorrow.    LOS: 0 days   [facility] Pager [phone] 06/07/2018, 12:39 PM  If 7PM-7AM, please contact night-coverage at [email or web address], password [email or web address]

## 2018-06-08 ENCOUNTER — Observation Stay (HOSPITAL_COMMUNITY): Payer: Medicare HMO

## 2018-06-08 ENCOUNTER — Other Ambulatory Visit: Payer: Self-pay

## 2018-06-08 DIAGNOSIS — N179 Acute kidney failure, unspecified: Secondary | ICD-10-CM | POA: Diagnosis not present

## 2018-06-08 DIAGNOSIS — N390 Urinary tract infection, site not specified: Secondary | ICD-10-CM

## 2018-06-08 DIAGNOSIS — E872 Acidosis: Secondary | ICD-10-CM | POA: Diagnosis not present

## 2018-06-08 DIAGNOSIS — J449 Chronic obstructive pulmonary disease, unspecified: Secondary | ICD-10-CM | POA: Diagnosis not present

## 2018-06-08 DIAGNOSIS — R112 Nausea with vomiting, unspecified: Secondary | ICD-10-CM | POA: Diagnosis not present

## 2018-06-08 DIAGNOSIS — G300 Alzheimer's disease with early onset: Secondary | ICD-10-CM | POA: Diagnosis not present

## 2018-06-08 DIAGNOSIS — R197 Diarrhea, unspecified: Secondary | ICD-10-CM | POA: Diagnosis not present

## 2018-06-08 DIAGNOSIS — R69 Illness, unspecified: Secondary | ICD-10-CM | POA: Diagnosis not present

## 2018-06-08 DIAGNOSIS — G40909 Epilepsy, unspecified, not intractable, without status epilepticus: Secondary | ICD-10-CM | POA: Diagnosis not present

## 2018-06-08 DIAGNOSIS — I10 Essential (primary) hypertension: Secondary | ICD-10-CM | POA: Diagnosis not present

## 2018-06-08 LAB — URINALYSIS, ROUTINE W REFLEX MICROSCOPIC
BILIRUBIN URINE: NEGATIVE
Glucose, UA: NEGATIVE mg/dL
Ketones, ur: NEGATIVE mg/dL
Nitrite: POSITIVE — AB
PROTEIN: NEGATIVE mg/dL
SPECIFIC GRAVITY, URINE: 1.009 (ref 1.005–1.030)
WBC, UA: >50 WBC/hpf — ABNORMAL HIGH (ref 0–5)
pH: 7 (ref 5.0–8.0)

## 2018-06-08 LAB — BASIC METABOLIC PANEL
ANION GAP: 7 (ref 5–15)
BUN: 22 mg/dL (ref 8–23)
CHLORIDE: 108 mmol/L (ref 98–111)
CO2: 26 mmol/L (ref 22–32)
Calcium: 7.8 mg/dL — ABNORMAL LOW (ref 8.9–10.3)
Creatinine, Ser: 1.29 mg/dL — ABNORMAL HIGH (ref 0.44–1.00)
GFR calc Af Amer: 49 mL/min — ABNORMAL LOW (ref >60)
GFR calc non Af Amer: 42 mL/min — ABNORMAL LOW (ref >60)
GLUCOSE: 120 mg/dL — AB (ref 70–99)
POTASSIUM: 3.7 mmol/L (ref 3.5–5.1)
SODIUM: 141 mmol/L (ref 135–145)

## 2018-06-08 LAB — HEPATIC FUNCTION PANEL
ALK PHOS: 107 U/L (ref 38–126)
ALT: 9 U/L (ref 0–44)
AST: 17 U/L (ref 15–41)
Albumin: 2.6 g/dL — ABNORMAL LOW (ref 3.5–5.0)
Total Bilirubin: 0.4 mg/dL (ref 0.3–1.2)
Total Protein: 5.2 g/dL — ABNORMAL LOW (ref 6.5–8.1)

## 2018-06-08 MED ORDER — SODIUM CHLORIDE 0.45 % IV SOLN
INTRAVENOUS | Status: AC
  Administered 2018-06-08 – 2018-06-09 (×2): via INTRAVENOUS

## 2018-06-08 MED ORDER — SODIUM CHLORIDE 0.9 % IV SOLN
1.0000 g | INTRAVENOUS | Status: DC
  Administered 2018-06-08: 1 g via INTRAVENOUS
  Filled 2018-06-08 (×2): qty 10

## 2018-06-08 MED ORDER — PROMETHAZINE HCL 25 MG/ML IJ SOLN
12.5000 mg | Freq: Four times a day (QID) | INTRAMUSCULAR | Status: DC | PRN
  Administered 2018-06-08 (×2): 12.5 mg via INTRAVENOUS
  Filled 2018-06-08 (×2): qty 1

## 2018-06-08 NOTE — Progress Notes (Signed)
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  Due to her dementia history is limited.  Patient was found to have mildly elevated creatinine compared to her baseline.  She was admitted for further work-up.  Reason for Visit: Chest pain.  Diarrhea  Consultants: None  Procedures:   Transthoracic echocardiogram Study Conclusions  - Left ventricle: The cavity size was normal. Wall thickness was   normal. Systolic function was normal. The estimated ejection   fraction was in the range of 60% to 65%. Wall motion was normal;   there were no regional wall motion abnormalities. Doppler   parameters are consistent with abnormal left ventricular   relaxation (grade 1 diastolic dysfunction). - Aortic valve: There was mild regurgitation. Valve area (VTI):   1.99 cm^2. Valve area (Vmax): 1.97 cm^2. Valve area (Vmean): 1.76   cm^2. - Mitral valve: There was mild regurgitation.   Antibiotics: None  Subjective/Interval History: Patient states that she is feeling nauseated this morning.  Apparently had multiple episodes of vomiting overnight.  Also admits to some abdominal discomfort.  Denies any diarrhea.    ROS: Denies headaches  Objective:  Vital Signs  Vitals:   06/06/18 1624 06/07/18 0827 06/07/18 1720 06/08/18 0800  BP:  121/80 115/71 (!) 154/71 (!) 173/68  Pulse:  78 74 71  Resp:      Temp:      SpO2:      Weight:      Height:        Intake/Output Summary (Last 24 hours) at 06/08/2018 1217 Last data filed at 06/08/2018 0930 Gross per 24 hour  Intake 2027.06 ml  Output 200 ml  Net 1827.06 ml   Filed Weights   06/06/18 0520  Weight: 39.6 kg (87 lb 6.4 oz)    General appearance: Alert.  In no distress Resp: Clear to auscultation bilaterally. Cardio: S1-S2 is normal regular.  No S3-S4.  No rubs murmurs or bruit GI: Abdomen is soft.  Diffusely tender more so in the suprapubic area.  No rebound rigidity or guarding.  No masses organomegaly. Extremities: No edema Neurologic: Noted to be mildly distracted.  No obvious focal neurological deficits.   Lab Results:  Data Reviewed: I have personally reviewed following labs and imaging studies  CBC: Recent Labs  Lab 06/06/18 0603 06/06/18 0817 06/06/18 1339 06/06/18 2116 06/07/18 0530  WBC 9.2 9.3  --   --  8.9  HGB 9.0* 9.2* 8.7* 8.7* 8.8*  HCT 31.3* 31.6* 29.5* 30.5* 29.8*  MCV 98.1 97.2  --   --  97.7  PLT 175 171  --   --  956    Basic Metabolic Panel: Recent Labs  Lab 06/06/18 0603 06/07/18 0530 06/07/18 1555 06/08/18 0449  NA 140 140 140 141  K 4.6 4.5 4.3 3.7  CL 123* 123* 115* 108  CO2 11* 11* 20* 26  GLUCOSE 85 87 90 120*  BUN 29* 27* 24* 22  CREATININE 1.69* 1.53* 1.49*  1.29*  CALCIUM 7.8* 8.1* 7.8* 7.8*    GFR: Estimated Creatinine Clearance: 26.8 mL/min (A) (by C-G formula based on SCr of 1.29 mg/dL (H)).  Liver Function Tests: Recent Labs  Lab 06/06/18 0603 06/08/18 0449  AST 10* 17  ALT 8 9  ALKPHOS 128* 107  BILITOT 0.3 0.4  PROT 5.7* 5.2*  ALBUMIN 2.8* 2.6*    Cardiac Enzymes: Recent Labs  Lab 06/06/18 0817 06/06/18 1339 06/06/18 2116  TROPONINI <0.03 <0.03 <0.03     Radiology Studies: Dg Abd Acute W/chest  Result Date: 06/08/2018 CLINICAL DATA:  Nausea and vomiting for 2 days EXAM: DG  ABDOMEN ACUTE W/ 1V CHEST COMPARISON:  Chest x-ray 3 days ago FINDINGS: Chronic interstitial coarsening and large lung volumes. There is no edema, consolidation, effusion, or pneumothorax. Atherosclerotic calcification of the aorta. Remote right posterior rib fractures. Normal heart size. Ureteral stent on the left in good position. No visible stone along the stent. Left iliac stent and prior pelvic dissection. Right lower quadrant bowel sutures. Granulomatous type calcifications in the spleen. Normal bowel gas pattern. No evidence of pneumoperitoneum. IMPRESSION: 1. No acute finding in the chest or abdomen. 2. Left internal ureteral stent in good position. 3. COPD. Electronically Signed   By: Monte Fantasia M.D.   On: 06/08/2018 10:48     Medications:  Scheduled: . atorvastatin  10 mg Oral Daily  . carvedilol  12.5 mg Oral BID WC  . citalopram  40 mg Oral Daily  . clopidogrel  75 mg Oral Daily  . donepezil  10 mg Oral QHS  . feeding supplement (ENSURE ENLIVE)  237 mL Oral TID BM  . memantine  10 mg Oral BID  . multivitamin with minerals  1 tablet Oral Daily  . phenytoin  100 mg Oral BID   Continuous: . sodium chloride    . cefTRIAXone (ROCEPHIN)  IV     DHR:CBULAGTX (SUBLIMAZE) injection, ondansetron **OR** ondansetron (ZOFRAN) IV, oxyCODONE, promethazine  Assessment/Plan:   Nausea and vomiting Acute abdominal series unremarkable for any obstruction.  Her UA was noted to be abnormal.  She does have suprapubic tenderness.  She likely has a urinary tract infection.  This would explain her symptoms.  She will be continued on IV fluids for another day.  Treat her UTI.  Urinary tract infection Patient with indwelling Foley catheter.  Abdominal film does show ureteral stent which is in good position.  This was placed.  Follow-up urine cultures.  Treat with ceftriaxone for now.  Diarrhea with concern for hematochezia Diarrhea appears to have resolved.  Patient has not had any further  episodes of diarrhea in the hospital.  Her hemoglobin is stable compared to her baseline.  Patient does have an extensive GI history as mentioned under HPI.  C. difficile was considered however since she has not had any diarrhea this remains unlikely.  No clear reason to consult gastroenterology.  Acute kidney injury on CKD stage III Creatinine noted to be mildly higher than her baseline.  Patient given IV fluids.  Renal function is improving.  Monitor urine output.  Continue hydration for another day due to her GI symptoms.    Non-anion gap metabolic acidosis Bicarbonate level was noted to be low at 11.  She does have low bicarbonate level at baseline.  This is likely due to GI loss.  She was given bicarbonate infusion with improvement.  Okay to stop today.  Levels have improved.    Chest pain Resolved.  Somewhat atypical presentation.  EKG was unremarkable.  Troponins are normal. Echocardiogram does not show any wall motion abnormalities.  No further work-up at this time.  History of peripheral arterial disease She is status post stent.  Continue home medications including statin and Plavix.  Essential hypertension/hyperlipidemia Stable.  Continue home medications.  History of dementia Continue home medications including donepezil as well as amantadine.  History of seizure disorder Continue phenytoin.  DVT Prophylaxis: SCDs    Code Status: Full code Family Communication: Discussed with the patient.  No family at bedside Disposition Plan: Management outlined above.  Treat UTI.  Wait for GI symptoms improved.    LOS: 0 days   [facility] Pager [phone] 06/08/2018, 12:17 PM  If 7PM-7AM, please contact night-coverage at [email or web address], password [facility]

## 2018-06-09 ENCOUNTER — Other Ambulatory Visit: Payer: Self-pay | Admitting: Pulmonary Disease

## 2018-06-09 DIAGNOSIS — R112 Nausea with vomiting, unspecified: Secondary | ICD-10-CM | POA: Diagnosis not present

## 2018-06-09 DIAGNOSIS — N179 Acute kidney failure, unspecified: Secondary | ICD-10-CM | POA: Diagnosis not present

## 2018-06-09 DIAGNOSIS — R197 Diarrhea, unspecified: Secondary | ICD-10-CM | POA: Diagnosis not present

## 2018-06-09 DIAGNOSIS — N390 Urinary tract infection, site not specified: Secondary | ICD-10-CM | POA: Diagnosis not present

## 2018-06-09 DIAGNOSIS — G40909 Epilepsy, unspecified, not intractable, without status epilepticus: Secondary | ICD-10-CM | POA: Diagnosis not present

## 2018-06-09 DIAGNOSIS — R69 Illness, unspecified: Secondary | ICD-10-CM | POA: Diagnosis not present

## 2018-06-09 DIAGNOSIS — G300 Alzheimer's disease with early onset: Secondary | ICD-10-CM | POA: Diagnosis not present

## 2018-06-09 DIAGNOSIS — F0280 Dementia in other diseases classified elsewhere without behavioral disturbance: Secondary | ICD-10-CM | POA: Diagnosis not present

## 2018-06-09 DIAGNOSIS — E872 Acidosis: Secondary | ICD-10-CM | POA: Diagnosis not present

## 2018-06-09 DIAGNOSIS — I10 Essential (primary) hypertension: Secondary | ICD-10-CM | POA: Diagnosis not present

## 2018-06-09 LAB — URINE CULTURE

## 2018-06-09 LAB — BASIC METABOLIC PANEL
Anion gap: 10 (ref 5–15)
BUN: 16 mg/dL (ref 8–23)
CO2: 26 mmol/L (ref 22–32)
Calcium: 7.7 mg/dL — ABNORMAL LOW (ref 8.9–10.3)
Chloride: 103 mmol/L (ref 98–111)
Creatinine, Ser: 1.27 mg/dL — ABNORMAL HIGH (ref 0.44–1.00)
GFR calc non Af Amer: 43 mL/min — ABNORMAL LOW (ref >60)
GFR, EST AFRICAN AMERICAN: 50 mL/min — AB (ref >60)
Glucose, Bld: 108 mg/dL — ABNORMAL HIGH (ref 70–99)
POTASSIUM: 4.5 mmol/L (ref 3.5–5.1)
Sodium: 139 mmol/L (ref 135–145)

## 2018-06-09 LAB — CBC
HCT: 27.7 % — ABNORMAL LOW (ref 36.0–46.0)
HEMOGLOBIN: 8.5 g/dL — AB (ref 12.0–15.0)
MCH: 28.6 pg (ref 26.0–34.0)
MCHC: 30.7 g/dL (ref 30.0–36.0)
MCV: 93.3 fL (ref 78.0–100.0)
Platelets: 153 10*3/uL (ref 150–400)
RBC: 2.97 MIL/uL — AB (ref 3.87–5.11)
RDW: 13.9 % (ref 11.5–15.5)
WBC: 12.5 10*3/uL — AB (ref 4.0–10.5)

## 2018-06-09 MED ORDER — CEPHALEXIN 500 MG PO CAPS: 500.0000 mg | ORAL_CAPSULE | Freq: Two times a day (BID) | ORAL | 0 refills | Status: AC

## 2018-06-09 MED ORDER — CEPHALEXIN 500 MG PO CAPS
500.0000 mg | ORAL_CAPSULE | Freq: Two times a day (BID) | ORAL | Status: DC
  Administered 2018-06-09: 500 mg via ORAL
  Filled 2018-06-09 (×2): qty 1

## 2018-06-09 NOTE — Discharge Summary (Signed)
[facility]  Physician Discharge Summary   Patient ID: [name] MRN: [id number] DOB/AGE: 67-Jun-1952 67 y.o.  Admit date: 06/06/2018 Discharge date: 06/09/2018  PCP: Marco Collie, MD  DISCHARGE DIAGNOSES:  Urinary tract infection, complicated  RECOMMENDATIONS FOR OUTPATIENT FOLLOW UP: 1. 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  Due to her dementia history is limited.  Patient was found to have mildly elevated creatinine compared to her baseline.  She was admitted for further work-up.  Consultations:  None  Procedures:  Transthoracic echocardiogram Study Conclusions  - Left ventricle: The cavity size was normal. Wall thickness was normal. Systolic function was normal. The estimated ejection fraction was in the range of 60% to 65%. Wall motion was normal; there were no regional wall motion abnormalities. Doppler parameters are consistent with abnormal left ventricular relaxation (grade 1 diastolic dysfunction). - Aortic valve: There was mild regurgitation. Valve area (VTI): 1.99 cm^2. Valve area (Vmax): 1.97 cm^2. Valve area (Vmean): 1.76 cm^2. - Mitral valve: There was mild regurgitation.    HOSPITAL COURSE:   Nausea and  vomiting Symptoms thought to be secondary to UTI.  Patient was started on antibiotics with improvement of symptoms.  She is tolerated her diet.  No further episodes of nausea vomiting noted.  Acute abdominal series unremarkable for any obstruction.   Urinary tract infection Patient with indwelling Foley catheter.  Followed by urology in [place], Dr. Nila Nephew. Abdominal film does show ureteral stent which is in good position.    Urine cultures are still pending.  Apparently patient has 2 prescriptions waiting for her at her pharmacy and Bactrim and Linezolid.  Patient's urology office was called.  Apparently she grew enterococcus and E. coli in the urine cultures on June 24 and so these prescriptions were called in.  Replace the Bactrim with Keflex due to recent renal failure.  Continue with linezolid as recommended by her urologist.   Diarrhea with concern for hematochezia Diarrhea appears to have resolved.  Patient has not had any further episodes of diarrhea or bleeding in the hospital.  Her hemoglobin is stable compared to her baseline.  Patient does have an extensive GI history as mentioned under HPI.  C. difficile was considered however since she has not had any diarrhea this remains unlikely.    Acute kidney injury on CKD stage III Creatinine noted to be mildly higher than her baseline.  Patient given IV fluids.  Renal function has improved and close to her baseline.  Non-anion gap metabolic acidosis Bicarbonate level was noted to be low at 11.  She does have low bicarbonate level at baseline.  This is likely due to GI loss.  She was given bicarbonate infusion with improvement.      Chest pain Resolved.  Somewhat atypical presentation.  EKG was unremarkable.  Troponins are normal. Echocardiogram does not show any wall motion abnormalities.  No further  work-up at this time.  History of peripheral arterial disease She is status post stent.  Continue home medications including statin and  Plavix.  Essential hypertension/hyperlipidemia Stable.  Continue home medications.  History of dementia Continue home medications including donepezil as well as amantadine.  History of seizure disorder Continue phenytoin.  Overall stable.  Patient wants to go home.  She is pleasantly confused from her dementia.  Discussed with her son over the phone.  Okay for discharge today.     PERTINENT LABS:  The results of significant diagnostics from this hospitalization (including imaging, microbiology, ancillary and laboratory) are listed below for reference.     Labs: Basic Metabolic Panel: Recent Labs  Lab 06/06/18 0603 06/07/18 0530 06/07/18 1555 06/08/18 0449 06/09/18 0636  NA 140 140 140 141 139  K 4.6 4.5 4.3 3.7 4.5  CL 123* 123* 115* 108 103  CO2 11* 11* 20* 26 26  GLUCOSE 85 87 90 120* 108*  BUN 29* 27* 24* 22 16  CREATININE 1.69* 1.53* 1.49* 1.29* 1.27*  CALCIUM 7.8* 8.1* 7.8* 7.8* 7.7*   Liver Function Tests: Recent Labs  Lab 06/06/18 0603 06/08/18 0449  AST 10* 17  ALT 8 9  ALKPHOS 128* 107  BILITOT 0.3 0.4  PROT 5.7* 5.2*  ALBUMIN 2.8* 2.6*   CBC: Recent Labs  Lab 06/06/18 0603 06/06/18 0817 06/06/18 1339 06/06/18 2116 06/07/18 0530 06/09/18 0636  WBC 9.2 9.3  --   --  8.9 12.5*  HGB 9.0* 9.2* 8.7* 8.7* 8.8* 8.5*  HCT 31.3* 31.6* 29.5* 30.5* 29.8* 27.7*  MCV 98.1 97.2  --   --  97.7 93.3  PLT 175 171  --   --  166 153   Cardiac Enzymes: Recent Labs  Lab 06/06/18 0817 06/06/18 1339 06/06/18 2116  TROPONINI <0.03 <0.03 <0.03    IMAGING STUDIES Dg Abd Acute W/chest  Result Date: 06/08/2018 CLINICAL DATA:  Nausea and vomiting for 2 days EXAM: DG ABDOMEN ACUTE W/ 1V CHEST COMPARISON:  Chest x-ray 3 days ago FINDINGS: Chronic interstitial coarsening and large lung volumes. There is no edema, consolidation, effusion, or pneumothorax. Atherosclerotic calcification of the aorta. Remote right posterior rib fractures. Normal heart size.  Ureteral stent on the left in good position. No visible stone along the stent. Left iliac stent and prior pelvic dissection. Right lower quadrant bowel sutures. Granulomatous type calcifications in the spleen. Normal bowel gas pattern. No evidence of pneumoperitoneum. IMPRESSION: 1. No acute finding in the chest or abdomen. 2. Left internal ureteral stent in good position. 3. COPD. Electronically Signed   By: Monte Fantasia M.D.   On: 06/08/2018 10:48    DISCHARGE EXAMINATION: Vitals:   06/08/18 1649 06/08/18 2145 06/08/18 2216 06/09/18 0559  BP: (!) 160/71 (!) 161/68 90/67 (!) 154/66  Pulse: 80 82 88 79  Resp:  20 18 18   Temp:  98.3 F (36.8 C) 99.1 F (37.3 C) 99.2 F (37.3 C)  TempSrc:  Oral  Oral  SpO2:  100% 93% 98%  Weight:      Height:       General appearance: alert, cooperative, appears stated age and no distress Resp: clear to auscultation bilaterally Cardio: regular rate and rhythm, S1, S2 normal, no murmur, click, rub or gallop GI: soft, non-tender; bowel sounds normal; no masses,  no organomegaly  DISPOSITION: Home with son  Discharge Instructions    Call MD for:  difficulty breathing, headache or visual disturbances   Complete by:  As directed  Call MD for:  extreme fatigue   Complete by:  As directed    Call MD for:  persistant dizziness or light-headedness   Complete by:  As directed    Call MD for:  persistant nausea and vomiting   Complete by:  As directed    Call MD for:  severe uncontrolled pain   Complete by:  As directed    Call MD for:  temperature >100.4   Complete by:  As directed    Discharge instructions   Complete by:  As directed    Please be sure to follow-up with your urologist, Dr. Nila Nephew in 1 to 2 weeks.  Follow-up with your primary care physician as well.  You will need blood work at follow-up.  You were cared for by a hospitalist during your hospital stay. If you have any questions about your discharge medications or the care you received  while you were in the hospital after you are discharged, you can call the unit and asked to speak with the hospitalist on call if the hospitalist that took care of you is not available. Once you are discharged, your primary care physician will handle any further medical issues. Please note that NO REFILLS for any discharge medications will be authorized once you are discharged, as it is imperative that you return to your primary care physician (or establish a relationship with a primary care physician if you do not have one) for your aftercare needs so that they can reassess your need for medications and monitor your lab values. If you do not have a primary care physician, you can call [phone] for a physician referral.   Increase activity slowly   Complete by:  As directed         Allergies as of 06/09/2018      Reactions   Codeine Nausea And Vomiting   REACTION: intolerance   Latex    itching      Medication List    STOP taking these medications   ibuprofen 200 MG tablet Commonly known as:  ADVIL,MOTRIN   ketorolac 10 MG tablet Commonly known as:  TORADOL   sulfamethoxazole-trimethoprim 800-160 MG tablet Commonly known as:  BACTRIM DS,SEPTRA DS     TAKE these medications   amLODipine 2.5 MG tablet Commonly known as:  NORVASC Take 2.5 mg by mouth every evening.   atorvastatin 10 MG tablet Commonly known as:  LIPITOR Take 10 mg by mouth daily.   carvedilol 12.5 MG tablet Commonly known as:  COREG   cephALEXin 500 MG capsule Commonly known as:  KEFLEX Take 1 capsule (500 mg total) by mouth every 12 (twelve) hours for 10 days.   citalopram 40 MG tablet Commonly known as:  CELEXA Take 1 tablet (40 mg total) by mouth daily.   clopidogrel 75 MG tablet Commonly known as:  PLAVIX Take 75 mg by mouth daily.   donepezil 10 MG tablet Commonly known as:  ARICEPT TAKE 1 BY MOUTH AT BEDTIME   linezolid 600 MG tablet Commonly known as:  ZYVOX Take 600 mg by mouth 2 (two)  times daily. PRESCRIPTION WAS CALLED IN BY HER UROLOGIST DR. CHAO AT [place]. NOT PICKED UP AS OF THIS ADMISSION.   memantine 10 MG tablet Commonly known as:  NAMENDA Take 1 tablet (10 mg total) by mouth 2 (two) times daily.   oxyCODONE 5 MG immediate release tablet Commonly known as:  Oxy IR/ROXICODONE Take 2.5 mg by mouth every 8 (eight) hours as needed (pain).  phenytoin 100 MG ER capsule Commonly known as:  DILANTIN Take 2 capsules (200 mg total) by mouth at bedtime. What changed:    how much to take  when to take this   venlafaxine XR 150 MG 24 hr capsule Commonly known as:  EFFEXOR-XR Take 150 mg by mouth daily with breakfast.        Follow-up Information    Joie Bimler, MD. Schedule an appointment as soon as possible for a visit in 2 week(s).   Specialty:  Urology Contact information: [place] [address] [phone]        Marco Collie, MD. Schedule an appointment as soon as possible for a visit in 1 week(s).   Specialty:  Family Medicine Why:  blood work to check renal function and electrolytes Contact information: [place] [address] [phone]           TOTAL DISCHARGE TIME: 35 mins  [facility] Pager [phone]  06/09/2018, 1:49 PM

## 2018-06-09 NOTE — Care Management Note (Addendum)
Case Management Note  Patient Details  Name: [name] MRN: [id number] Date of Birth: 1951/03/22  Subjective/Objective:           Alzheimer disease. From home with son/daughter n law. Son states loss dad, pt's husband, 3 weeks ago. PTA son states pt independent with ADL's , no DME usage. Family provides 24/7 supervision for pt.         Chyann Ambrocio (Son)    [phone]      PCP: Marco Collie  Action/Plan: Transition to home with family. Family to provide transportation to home.  Expected Discharge Date:  06/10/18               Expected Discharge Plan:     In-House Referral:     Discharge planning Services  CM Consult  Post Acute Care Choice:    Choice offered to:  Adult Children([place])  DME Arranged:   N/A DME Agency:   N/A  HH Arranged:   N/A HH Agency:   N/A  Status of Service:  Completed, signed off  If discussed at [place] of Stay Meetings, dates discussed:    Additional Comments:  Sharin Mons, RN 06/09/2018, 10:28 AM

## 2018-06-09 NOTE — Discharge Instructions (Signed)

## 2018-06-09 NOTE — Plan of Care (Signed)
Patient is on enteric contact, awaiting stool sample collection for c-diff test. pt had not as a bm since admission. Will continue to monitor

## 2018-06-09 NOTE — Progress Notes (Signed)
[name] to be D/C'd Home per MD order.  Discussed prescriptions and follow up appointments with the patient. Prescriptions given to patient, medication list explained in detail. Pt verbalized understanding.  Allergies as of 06/09/2018      Reactions   Codeine Nausea And Vomiting   REACTION: intolerance   Latex    itching      Medication List    STOP taking these medications   ibuprofen 200 MG tablet Commonly known as:  ADVIL,MOTRIN   ketorolac 10 MG tablet Commonly known as:  TORADOL   sulfamethoxazole-trimethoprim 800-160 MG tablet Commonly known as:  BACTRIM DS,SEPTRA DS     TAKE these medications   amLODipine 2.5 MG tablet Commonly known as:  NORVASC Take 2.5 mg by mouth every evening.   atorvastatin 10 MG tablet Commonly known as:  LIPITOR Take 10 mg by mouth daily.   carvedilol 12.5 MG tablet Commonly known as:  COREG   cephALEXin 500 MG capsule Commonly known as:  KEFLEX Take 1 capsule (500 mg total) by mouth every 12 (twelve) hours for 10 days.   citalopram 40 MG tablet Commonly known as:  CELEXA Take 1 tablet (40 mg total) by mouth daily.   clopidogrel 75 MG tablet Commonly known as:  PLAVIX Take 75 mg by mouth daily.   donepezil 10 MG tablet Commonly known as:  ARICEPT TAKE 1 BY MOUTH AT BEDTIME   linezolid 600 MG tablet Commonly known as:  ZYVOX Take 600 mg by mouth 2 (two) times daily. PRESCRIPTION WAS CALLED IN BY HER UROLOGIST DR. CHAO AT [place]. NOT PICKED UP AS OF THIS ADMISSION.   memantine 10 MG tablet Commonly known as:  NAMENDA Take 1 tablet (10 mg total) by mouth 2 (two) times daily.   oxyCODONE 5 MG immediate release tablet Commonly known as:  Oxy IR/ROXICODONE Take 2.5 mg by mouth every 8 (eight) hours as needed (pain).   phenytoin 100 MG ER capsule Commonly known as:  DILANTIN Take 2 capsules (200 mg total) by mouth at bedtime. What changed:    how much to take  when to take this   venlafaxine XR 150 MG 24 hr  capsule Commonly known as:  EFFEXOR-XR Take 150 mg by mouth daily with breakfast.       Vitals:   06/09/18 0559 06/09/18 1433  BP: (!) 154/66 (!) 183/80  Pulse: 79 73  Resp: 18 17  Temp: 99.2 F (37.3 C) 98.7 F (37.1 C)  SpO2: 98% 96%    Skin clean, dry and intact without evidence of skin break down, no evidence of skin tears noted. IV catheter discontinued intact. Site without signs and symptoms of complications. Dressing and pressure applied. Pt denies pain at this time. No complaints noted.  An After Visit Summary was printed and given to the patient. Patient escorted via [place], and D/C home via private auto.  Chapman Fitch BSN, RN [employer] [address] Phone [phone]

## 2018-07-17 DIAGNOSIS — N390 Urinary tract infection, site not specified: Secondary | ICD-10-CM | POA: Diagnosis not present

## 2018-07-17 DIAGNOSIS — R339 Retention of urine, unspecified: Secondary | ICD-10-CM | POA: Diagnosis not present

## 2018-07-25 DIAGNOSIS — S7001XA Contusion of right hip, initial encounter: Secondary | ICD-10-CM | POA: Diagnosis not present

## 2018-07-25 DIAGNOSIS — S199XXA Unspecified injury of neck, initial encounter: Secondary | ICD-10-CM | POA: Diagnosis not present

## 2018-07-25 DIAGNOSIS — R102 Pelvic and perineal pain: Secondary | ICD-10-CM | POA: Diagnosis not present

## 2018-07-25 DIAGNOSIS — M25561 Pain in right knee: Secondary | ICD-10-CM | POA: Diagnosis not present

## 2018-07-25 DIAGNOSIS — S299XXA Unspecified injury of thorax, initial encounter: Secondary | ICD-10-CM | POA: Diagnosis not present

## 2018-07-25 DIAGNOSIS — S3993XA Unspecified injury of pelvis, initial encounter: Secondary | ICD-10-CM | POA: Diagnosis not present

## 2018-07-25 DIAGNOSIS — W010XXA Fall on same level from slipping, tripping and stumbling without subsequent striking against object, initial encounter: Secondary | ICD-10-CM | POA: Diagnosis not present

## 2018-07-25 DIAGNOSIS — M542 Cervicalgia: Secondary | ICD-10-CM | POA: Diagnosis not present

## 2018-07-25 DIAGNOSIS — R0781 Pleurodynia: Secondary | ICD-10-CM | POA: Diagnosis not present

## 2018-08-01 DIAGNOSIS — N390 Urinary tract infection, site not specified: Secondary | ICD-10-CM | POA: Diagnosis not present

## 2018-08-04 DIAGNOSIS — B9689 Other specified bacterial agents as the cause of diseases classified elsewhere: Secondary | ICD-10-CM | POA: Diagnosis not present

## 2018-08-04 DIAGNOSIS — R69 Illness, unspecified: Secondary | ICD-10-CM | POA: Diagnosis not present

## 2018-08-04 DIAGNOSIS — N3001 Acute cystitis with hematuria: Secondary | ICD-10-CM | POA: Diagnosis not present

## 2018-08-04 DIAGNOSIS — N183 Chronic kidney disease, stage 3 (moderate): Secondary | ICD-10-CM | POA: Diagnosis not present

## 2018-08-04 DIAGNOSIS — Z8673 Personal history of transient ischemic attack (TIA), and cerebral infarction without residual deficits: Secondary | ICD-10-CM | POA: Diagnosis not present

## 2018-08-04 DIAGNOSIS — R109 Unspecified abdominal pain: Secondary | ICD-10-CM | POA: Diagnosis not present

## 2018-08-04 DIAGNOSIS — I129 Hypertensive chronic kidney disease with stage 1 through stage 4 chronic kidney disease, or unspecified chronic kidney disease: Secondary | ICD-10-CM | POA: Diagnosis not present

## 2018-08-04 DIAGNOSIS — J449 Chronic obstructive pulmonary disease, unspecified: Secondary | ICD-10-CM | POA: Diagnosis not present

## 2018-08-04 DIAGNOSIS — Z79899 Other long term (current) drug therapy: Secondary | ICD-10-CM | POA: Diagnosis not present

## 2018-08-04 DIAGNOSIS — Z79891 Long term (current) use of opiate analgesic: Secondary | ICD-10-CM | POA: Diagnosis not present

## 2018-08-05 DIAGNOSIS — N131 Hydronephrosis with ureteral stricture, not elsewhere classified: Secondary | ICD-10-CM | POA: Diagnosis not present

## 2018-08-05 DIAGNOSIS — I10 Essential (primary) hypertension: Secondary | ICD-10-CM | POA: Diagnosis not present

## 2018-08-05 DIAGNOSIS — Z7902 Long term (current) use of antithrombotics/antiplatelets: Secondary | ICD-10-CM | POA: Diagnosis not present

## 2018-08-05 DIAGNOSIS — J449 Chronic obstructive pulmonary disease, unspecified: Secondary | ICD-10-CM | POA: Diagnosis not present

## 2018-08-05 DIAGNOSIS — Z8673 Personal history of transient ischemic attack (TIA), and cerebral infarction without residual deficits: Secondary | ICD-10-CM | POA: Diagnosis not present

## 2018-08-05 DIAGNOSIS — Z79899 Other long term (current) drug therapy: Secondary | ICD-10-CM | POA: Diagnosis not present

## 2018-08-05 DIAGNOSIS — N130 Hydronephrosis with ureteropelvic junction obstruction: Secondary | ICD-10-CM | POA: Diagnosis not present

## 2018-08-05 DIAGNOSIS — N132 Hydronephrosis with renal and ureteral calculous obstruction: Secondary | ICD-10-CM | POA: Diagnosis not present

## 2018-08-05 DIAGNOSIS — E7800 Pure hypercholesterolemia, unspecified: Secondary | ICD-10-CM | POA: Diagnosis not present

## 2018-08-05 DIAGNOSIS — N3289 Other specified disorders of bladder: Secondary | ICD-10-CM | POA: Diagnosis not present

## 2018-08-05 DIAGNOSIS — R69 Illness, unspecified: Secondary | ICD-10-CM | POA: Diagnosis not present

## 2018-08-05 DIAGNOSIS — Z86718 Personal history of other venous thrombosis and embolism: Secondary | ICD-10-CM | POA: Diagnosis not present

## 2018-08-05 DIAGNOSIS — N319 Neuromuscular dysfunction of bladder, unspecified: Secondary | ICD-10-CM | POA: Diagnosis not present

## 2018-08-17 DIAGNOSIS — N10 Acute tubulo-interstitial nephritis: Secondary | ICD-10-CM | POA: Diagnosis not present

## 2018-08-17 DIAGNOSIS — N1339 Other hydronephrosis: Secondary | ICD-10-CM | POA: Diagnosis not present

## 2018-08-17 DIAGNOSIS — R531 Weakness: Secondary | ICD-10-CM | POA: Diagnosis not present

## 2018-08-18 DIAGNOSIS — E860 Dehydration: Secondary | ICD-10-CM | POA: Diagnosis not present

## 2018-08-18 DIAGNOSIS — M25551 Pain in right hip: Secondary | ICD-10-CM | POA: Diagnosis not present

## 2018-08-18 DIAGNOSIS — G309 Alzheimer's disease, unspecified: Secondary | ICD-10-CM | POA: Diagnosis not present

## 2018-08-18 DIAGNOSIS — D631 Anemia in chronic kidney disease: Secondary | ICD-10-CM | POA: Diagnosis not present

## 2018-08-18 DIAGNOSIS — I119 Hypertensive heart disease without heart failure: Secondary | ICD-10-CM | POA: Diagnosis not present

## 2018-08-18 DIAGNOSIS — N183 Chronic kidney disease, stage 3 (moderate): Secondary | ICD-10-CM | POA: Diagnosis not present

## 2018-08-18 DIAGNOSIS — N12 Tubulo-interstitial nephritis, not specified as acute or chronic: Secondary | ICD-10-CM | POA: Diagnosis not present

## 2018-08-18 DIAGNOSIS — S79911A Unspecified injury of right hip, initial encounter: Secondary | ICD-10-CM | POA: Diagnosis not present

## 2018-08-18 DIAGNOSIS — R109 Unspecified abdominal pain: Secondary | ICD-10-CM | POA: Diagnosis not present

## 2018-08-18 DIAGNOSIS — R531 Weakness: Secondary | ICD-10-CM | POA: Diagnosis not present

## 2018-08-18 DIAGNOSIS — Z1621 Resistance to vancomycin: Secondary | ICD-10-CM | POA: Diagnosis not present

## 2018-08-18 DIAGNOSIS — S0990XA Unspecified injury of head, initial encounter: Secondary | ICD-10-CM | POA: Diagnosis not present

## 2018-08-18 DIAGNOSIS — M25511 Pain in right shoulder: Secondary | ICD-10-CM | POA: Diagnosis not present

## 2018-08-18 DIAGNOSIS — E875 Hyperkalemia: Secondary | ICD-10-CM | POA: Diagnosis not present

## 2018-08-18 DIAGNOSIS — J9811 Atelectasis: Secondary | ICD-10-CM | POA: Diagnosis not present

## 2018-08-18 DIAGNOSIS — W19XXXA Unspecified fall, initial encounter: Secondary | ICD-10-CM | POA: Diagnosis not present

## 2018-08-18 DIAGNOSIS — E872 Acidosis: Secondary | ICD-10-CM | POA: Diagnosis not present

## 2018-08-18 DIAGNOSIS — N1339 Other hydronephrosis: Secondary | ICD-10-CM | POA: Diagnosis not present

## 2018-08-18 DIAGNOSIS — N135 Crossing vessel and stricture of ureter without hydronephrosis: Secondary | ICD-10-CM | POA: Diagnosis not present

## 2018-08-18 DIAGNOSIS — M25521 Pain in right elbow: Secondary | ICD-10-CM | POA: Diagnosis not present

## 2018-08-18 DIAGNOSIS — Z720 Tobacco use: Secondary | ICD-10-CM | POA: Diagnosis not present

## 2018-08-18 DIAGNOSIS — S59901A Unspecified injury of right elbow, initial encounter: Secondary | ICD-10-CM | POA: Diagnosis not present

## 2018-08-18 DIAGNOSIS — N10 Acute tubulo-interstitial nephritis: Secondary | ICD-10-CM | POA: Diagnosis not present

## 2018-08-18 DIAGNOSIS — D638 Anemia in other chronic diseases classified elsewhere: Secondary | ICD-10-CM | POA: Diagnosis not present

## 2018-08-18 DIAGNOSIS — J449 Chronic obstructive pulmonary disease, unspecified: Secondary | ICD-10-CM | POA: Diagnosis not present

## 2018-08-18 DIAGNOSIS — S4991XA Unspecified injury of right shoulder and upper arm, initial encounter: Secondary | ICD-10-CM | POA: Diagnosis not present

## 2018-08-18 DIAGNOSIS — N390 Urinary tract infection, site not specified: Secondary | ICD-10-CM | POA: Diagnosis not present

## 2018-08-18 DIAGNOSIS — S199XXA Unspecified injury of neck, initial encounter: Secondary | ICD-10-CM | POA: Diagnosis not present

## 2018-08-18 DIAGNOSIS — R52 Pain, unspecified: Secondary | ICD-10-CM | POA: Diagnosis not present

## 2018-08-18 DIAGNOSIS — B9620 Unspecified Escherichia coli [E. coli] as the cause of diseases classified elsewhere: Secondary | ICD-10-CM | POA: Diagnosis not present

## 2018-08-18 DIAGNOSIS — N289 Disorder of kidney and ureter, unspecified: Secondary | ICD-10-CM | POA: Diagnosis not present

## 2018-08-20 DIAGNOSIS — R531 Weakness: Secondary | ICD-10-CM

## 2018-08-20 DIAGNOSIS — E860 Dehydration: Secondary | ICD-10-CM

## 2018-08-22 DIAGNOSIS — N319 Neuromuscular dysfunction of bladder, unspecified: Secondary | ICD-10-CM | POA: Diagnosis not present

## 2018-08-22 DIAGNOSIS — G300 Alzheimer's disease with early onset: Secondary | ICD-10-CM | POA: Diagnosis not present

## 2018-08-22 DIAGNOSIS — D631 Anemia in chronic kidney disease: Secondary | ICD-10-CM | POA: Diagnosis not present

## 2018-08-22 DIAGNOSIS — K219 Gastro-esophageal reflux disease without esophagitis: Secondary | ICD-10-CM | POA: Diagnosis not present

## 2018-08-22 DIAGNOSIS — J439 Emphysema, unspecified: Secondary | ICD-10-CM | POA: Diagnosis not present

## 2018-08-22 DIAGNOSIS — N183 Chronic kidney disease, stage 3 (moderate): Secondary | ICD-10-CM | POA: Diagnosis not present

## 2018-08-22 DIAGNOSIS — I1310 Hypertensive heart and chronic kidney disease without heart failure, with stage 1 through stage 4 chronic kidney disease, or unspecified chronic kidney disease: Secondary | ICD-10-CM | POA: Diagnosis not present

## 2018-08-22 DIAGNOSIS — N10 Acute tubulo-interstitial nephritis: Secondary | ICD-10-CM | POA: Diagnosis not present

## 2018-08-22 DIAGNOSIS — R69 Illness, unspecified: Secondary | ICD-10-CM | POA: Diagnosis not present

## 2018-08-22 DIAGNOSIS — G40802 Other epilepsy, not intractable, without status epilepticus: Secondary | ICD-10-CM | POA: Diagnosis not present

## 2018-08-25 DIAGNOSIS — N183 Chronic kidney disease, stage 3 (moderate): Secondary | ICD-10-CM | POA: Diagnosis not present

## 2018-08-25 DIAGNOSIS — N10 Acute tubulo-interstitial nephritis: Secondary | ICD-10-CM | POA: Diagnosis not present

## 2018-08-25 DIAGNOSIS — G40802 Other epilepsy, not intractable, without status epilepticus: Secondary | ICD-10-CM | POA: Diagnosis not present

## 2018-08-25 DIAGNOSIS — N319 Neuromuscular dysfunction of bladder, unspecified: Secondary | ICD-10-CM | POA: Diagnosis not present

## 2018-08-25 DIAGNOSIS — J439 Emphysema, unspecified: Secondary | ICD-10-CM | POA: Diagnosis not present

## 2018-08-25 DIAGNOSIS — G300 Alzheimer's disease with early onset: Secondary | ICD-10-CM | POA: Diagnosis not present

## 2018-08-25 DIAGNOSIS — K219 Gastro-esophageal reflux disease without esophagitis: Secondary | ICD-10-CM | POA: Diagnosis not present

## 2018-08-25 DIAGNOSIS — I1310 Hypertensive heart and chronic kidney disease without heart failure, with stage 1 through stage 4 chronic kidney disease, or unspecified chronic kidney disease: Secondary | ICD-10-CM | POA: Diagnosis not present

## 2018-08-25 DIAGNOSIS — D631 Anemia in chronic kidney disease: Secondary | ICD-10-CM | POA: Diagnosis not present

## 2018-08-25 DIAGNOSIS — R69 Illness, unspecified: Secondary | ICD-10-CM | POA: Diagnosis not present

## 2018-08-27 DIAGNOSIS — I951 Orthostatic hypotension: Secondary | ICD-10-CM | POA: Diagnosis not present

## 2018-08-27 DIAGNOSIS — N390 Urinary tract infection, site not specified: Secondary | ICD-10-CM | POA: Diagnosis not present

## 2018-08-27 DIAGNOSIS — I1310 Hypertensive heart and chronic kidney disease without heart failure, with stage 1 through stage 4 chronic kidney disease, or unspecified chronic kidney disease: Secondary | ICD-10-CM | POA: Diagnosis not present

## 2018-08-27 DIAGNOSIS — R69 Illness, unspecified: Secondary | ICD-10-CM | POA: Diagnosis not present

## 2018-08-27 DIAGNOSIS — Z139 Encounter for screening, unspecified: Secondary | ICD-10-CM | POA: Diagnosis not present

## 2018-08-27 DIAGNOSIS — D631 Anemia in chronic kidney disease: Secondary | ICD-10-CM | POA: Diagnosis not present

## 2018-08-27 DIAGNOSIS — Z1331 Encounter for screening for depression: Secondary | ICD-10-CM | POA: Diagnosis not present

## 2018-08-27 DIAGNOSIS — J439 Emphysema, unspecified: Secondary | ICD-10-CM | POA: Diagnosis not present

## 2018-08-27 DIAGNOSIS — G300 Alzheimer's disease with early onset: Secondary | ICD-10-CM | POA: Diagnosis not present

## 2018-08-27 DIAGNOSIS — R296 Repeated falls: Secondary | ICD-10-CM | POA: Diagnosis not present

## 2018-08-27 DIAGNOSIS — G40802 Other epilepsy, not intractable, without status epilepticus: Secondary | ICD-10-CM | POA: Diagnosis not present

## 2018-08-27 DIAGNOSIS — N183 Chronic kidney disease, stage 3 (moderate): Secondary | ICD-10-CM | POA: Diagnosis not present

## 2018-08-27 DIAGNOSIS — N319 Neuromuscular dysfunction of bladder, unspecified: Secondary | ICD-10-CM | POA: Diagnosis not present

## 2018-08-27 DIAGNOSIS — N10 Acute tubulo-interstitial nephritis: Secondary | ICD-10-CM | POA: Diagnosis not present

## 2018-08-27 DIAGNOSIS — K219 Gastro-esophageal reflux disease without esophagitis: Secondary | ICD-10-CM | POA: Diagnosis not present

## 2018-08-28 DIAGNOSIS — N10 Acute tubulo-interstitial nephritis: Secondary | ICD-10-CM | POA: Diagnosis not present

## 2018-08-28 DIAGNOSIS — J439 Emphysema, unspecified: Secondary | ICD-10-CM | POA: Diagnosis not present

## 2018-08-28 DIAGNOSIS — I1310 Hypertensive heart and chronic kidney disease without heart failure, with stage 1 through stage 4 chronic kidney disease, or unspecified chronic kidney disease: Secondary | ICD-10-CM | POA: Diagnosis not present

## 2018-08-28 DIAGNOSIS — G40802 Other epilepsy, not intractable, without status epilepticus: Secondary | ICD-10-CM | POA: Diagnosis not present

## 2018-08-28 DIAGNOSIS — D631 Anemia in chronic kidney disease: Secondary | ICD-10-CM | POA: Diagnosis not present

## 2018-08-28 DIAGNOSIS — R69 Illness, unspecified: Secondary | ICD-10-CM | POA: Diagnosis not present

## 2018-08-28 DIAGNOSIS — N319 Neuromuscular dysfunction of bladder, unspecified: Secondary | ICD-10-CM | POA: Diagnosis not present

## 2018-08-28 DIAGNOSIS — K219 Gastro-esophageal reflux disease without esophagitis: Secondary | ICD-10-CM | POA: Diagnosis not present

## 2018-08-28 DIAGNOSIS — G300 Alzheimer's disease with early onset: Secondary | ICD-10-CM | POA: Diagnosis not present

## 2018-08-28 DIAGNOSIS — N183 Chronic kidney disease, stage 3 (moderate): Secondary | ICD-10-CM | POA: Diagnosis not present

## 2018-09-03 DIAGNOSIS — J439 Emphysema, unspecified: Secondary | ICD-10-CM | POA: Diagnosis not present

## 2018-09-03 DIAGNOSIS — N390 Urinary tract infection, site not specified: Secondary | ICD-10-CM | POA: Diagnosis not present

## 2018-09-03 DIAGNOSIS — K219 Gastro-esophageal reflux disease without esophagitis: Secondary | ICD-10-CM | POA: Diagnosis not present

## 2018-09-03 DIAGNOSIS — D631 Anemia in chronic kidney disease: Secondary | ICD-10-CM | POA: Diagnosis not present

## 2018-09-03 DIAGNOSIS — N319 Neuromuscular dysfunction of bladder, unspecified: Secondary | ICD-10-CM | POA: Diagnosis not present

## 2018-09-03 DIAGNOSIS — N183 Chronic kidney disease, stage 3 (moderate): Secondary | ICD-10-CM | POA: Diagnosis not present

## 2018-09-03 DIAGNOSIS — G40802 Other epilepsy, not intractable, without status epilepticus: Secondary | ICD-10-CM | POA: Diagnosis not present

## 2018-09-03 DIAGNOSIS — N10 Acute tubulo-interstitial nephritis: Secondary | ICD-10-CM | POA: Diagnosis not present

## 2018-09-03 DIAGNOSIS — R69 Illness, unspecified: Secondary | ICD-10-CM | POA: Diagnosis not present

## 2018-09-03 DIAGNOSIS — G300 Alzheimer's disease with early onset: Secondary | ICD-10-CM | POA: Diagnosis not present

## 2018-09-03 DIAGNOSIS — I1310 Hypertensive heart and chronic kidney disease without heart failure, with stage 1 through stage 4 chronic kidney disease, or unspecified chronic kidney disease: Secondary | ICD-10-CM | POA: Diagnosis not present

## 2018-09-10 DIAGNOSIS — N10 Acute tubulo-interstitial nephritis: Secondary | ICD-10-CM | POA: Diagnosis not present

## 2018-09-10 DIAGNOSIS — G40802 Other epilepsy, not intractable, without status epilepticus: Secondary | ICD-10-CM | POA: Diagnosis not present

## 2018-09-10 DIAGNOSIS — D631 Anemia in chronic kidney disease: Secondary | ICD-10-CM | POA: Diagnosis not present

## 2018-09-10 DIAGNOSIS — K219 Gastro-esophageal reflux disease without esophagitis: Secondary | ICD-10-CM | POA: Diagnosis not present

## 2018-09-10 DIAGNOSIS — G300 Alzheimer's disease with early onset: Secondary | ICD-10-CM | POA: Diagnosis not present

## 2018-09-10 DIAGNOSIS — N183 Chronic kidney disease, stage 3 (moderate): Secondary | ICD-10-CM | POA: Diagnosis not present

## 2018-09-10 DIAGNOSIS — I1310 Hypertensive heart and chronic kidney disease without heart failure, with stage 1 through stage 4 chronic kidney disease, or unspecified chronic kidney disease: Secondary | ICD-10-CM | POA: Diagnosis not present

## 2018-09-10 DIAGNOSIS — R69 Illness, unspecified: Secondary | ICD-10-CM | POA: Diagnosis not present

## 2018-09-10 DIAGNOSIS — J439 Emphysema, unspecified: Secondary | ICD-10-CM | POA: Diagnosis not present

## 2018-09-10 DIAGNOSIS — N319 Neuromuscular dysfunction of bladder, unspecified: Secondary | ICD-10-CM | POA: Diagnosis not present

## 2018-09-15 DIAGNOSIS — N10 Acute tubulo-interstitial nephritis: Secondary | ICD-10-CM | POA: Diagnosis not present

## 2018-09-15 DIAGNOSIS — R69 Illness, unspecified: Secondary | ICD-10-CM | POA: Diagnosis not present

## 2018-09-15 DIAGNOSIS — I1310 Hypertensive heart and chronic kidney disease without heart failure, with stage 1 through stage 4 chronic kidney disease, or unspecified chronic kidney disease: Secondary | ICD-10-CM | POA: Diagnosis not present

## 2018-09-15 DIAGNOSIS — K219 Gastro-esophageal reflux disease without esophagitis: Secondary | ICD-10-CM | POA: Diagnosis not present

## 2018-09-15 DIAGNOSIS — N183 Chronic kidney disease, stage 3 (moderate): Secondary | ICD-10-CM | POA: Diagnosis not present

## 2018-09-15 DIAGNOSIS — N319 Neuromuscular dysfunction of bladder, unspecified: Secondary | ICD-10-CM | POA: Diagnosis not present

## 2018-09-15 DIAGNOSIS — G300 Alzheimer's disease with early onset: Secondary | ICD-10-CM | POA: Diagnosis not present

## 2018-09-15 DIAGNOSIS — J439 Emphysema, unspecified: Secondary | ICD-10-CM | POA: Diagnosis not present

## 2018-09-15 DIAGNOSIS — D631 Anemia in chronic kidney disease: Secondary | ICD-10-CM | POA: Diagnosis not present

## 2018-09-15 DIAGNOSIS — G40802 Other epilepsy, not intractable, without status epilepticus: Secondary | ICD-10-CM | POA: Diagnosis not present

## 2018-09-17 DIAGNOSIS — R200 Anesthesia of skin: Secondary | ICD-10-CM | POA: Diagnosis not present

## 2018-09-17 DIAGNOSIS — G40802 Other epilepsy, not intractable, without status epilepticus: Secondary | ICD-10-CM | POA: Diagnosis not present

## 2018-09-17 DIAGNOSIS — N10 Acute tubulo-interstitial nephritis: Secondary | ICD-10-CM | POA: Diagnosis not present

## 2018-09-17 DIAGNOSIS — I129 Hypertensive chronic kidney disease with stage 1 through stage 4 chronic kidney disease, or unspecified chronic kidney disease: Secondary | ICD-10-CM | POA: Diagnosis not present

## 2018-09-17 DIAGNOSIS — N183 Chronic kidney disease, stage 3 (moderate): Secondary | ICD-10-CM | POA: Diagnosis not present

## 2018-09-17 DIAGNOSIS — G300 Alzheimer's disease with early onset: Secondary | ICD-10-CM | POA: Diagnosis not present

## 2018-09-17 DIAGNOSIS — J439 Emphysema, unspecified: Secondary | ICD-10-CM | POA: Diagnosis not present

## 2018-09-17 DIAGNOSIS — D631 Anemia in chronic kidney disease: Secondary | ICD-10-CM | POA: Diagnosis not present

## 2018-09-17 DIAGNOSIS — J449 Chronic obstructive pulmonary disease, unspecified: Secondary | ICD-10-CM | POA: Diagnosis not present

## 2018-09-17 DIAGNOSIS — I6200 Nontraumatic subdural hemorrhage, unspecified: Secondary | ICD-10-CM | POA: Diagnosis not present

## 2018-09-17 DIAGNOSIS — I10 Essential (primary) hypertension: Secondary | ICD-10-CM | POA: Diagnosis not present

## 2018-09-17 DIAGNOSIS — S065X0A Traumatic subdural hemorrhage without loss of consciousness, initial encounter: Secondary | ICD-10-CM | POA: Diagnosis not present

## 2018-09-17 DIAGNOSIS — I1310 Hypertensive heart and chronic kidney disease without heart failure, with stage 1 through stage 4 chronic kidney disease, or unspecified chronic kidney disease: Secondary | ICD-10-CM | POA: Diagnosis not present

## 2018-09-17 DIAGNOSIS — Z8673 Personal history of transient ischemic attack (TIA), and cerebral infarction without residual deficits: Secondary | ICD-10-CM | POA: Diagnosis not present

## 2018-09-17 DIAGNOSIS — R69 Illness, unspecified: Secondary | ICD-10-CM | POA: Diagnosis not present

## 2018-09-17 DIAGNOSIS — R51 Headache: Secondary | ICD-10-CM | POA: Diagnosis not present

## 2018-09-17 DIAGNOSIS — N319 Neuromuscular dysfunction of bladder, unspecified: Secondary | ICD-10-CM | POA: Diagnosis not present

## 2018-09-17 DIAGNOSIS — K219 Gastro-esophageal reflux disease without esophagitis: Secondary | ICD-10-CM | POA: Diagnosis not present

## 2018-09-17 DIAGNOSIS — Z79899 Other long term (current) drug therapy: Secondary | ICD-10-CM | POA: Diagnosis not present

## 2018-09-18 DIAGNOSIS — R69 Illness, unspecified: Secondary | ICD-10-CM | POA: Diagnosis not present

## 2018-09-18 DIAGNOSIS — N10 Acute tubulo-interstitial nephritis: Secondary | ICD-10-CM | POA: Diagnosis not present

## 2018-09-18 DIAGNOSIS — G40802 Other epilepsy, not intractable, without status epilepticus: Secondary | ICD-10-CM | POA: Diagnosis not present

## 2018-09-18 DIAGNOSIS — K219 Gastro-esophageal reflux disease without esophagitis: Secondary | ICD-10-CM | POA: Diagnosis not present

## 2018-09-18 DIAGNOSIS — J439 Emphysema, unspecified: Secondary | ICD-10-CM | POA: Diagnosis not present

## 2018-09-18 DIAGNOSIS — N319 Neuromuscular dysfunction of bladder, unspecified: Secondary | ICD-10-CM | POA: Diagnosis not present

## 2018-09-18 DIAGNOSIS — N183 Chronic kidney disease, stage 3 (moderate): Secondary | ICD-10-CM | POA: Diagnosis not present

## 2018-09-18 DIAGNOSIS — I1310 Hypertensive heart and chronic kidney disease without heart failure, with stage 1 through stage 4 chronic kidney disease, or unspecified chronic kidney disease: Secondary | ICD-10-CM | POA: Diagnosis not present

## 2018-09-18 DIAGNOSIS — D631 Anemia in chronic kidney disease: Secondary | ICD-10-CM | POA: Diagnosis not present

## 2018-09-18 DIAGNOSIS — G300 Alzheimer's disease with early onset: Secondary | ICD-10-CM | POA: Diagnosis not present

## 2018-09-19 DIAGNOSIS — N390 Urinary tract infection, site not specified: Secondary | ICD-10-CM | POA: Diagnosis not present

## 2018-09-19 DIAGNOSIS — R109 Unspecified abdominal pain: Secondary | ICD-10-CM | POA: Diagnosis not present

## 2018-09-19 DIAGNOSIS — N289 Disorder of kidney and ureter, unspecified: Secondary | ICD-10-CM | POA: Diagnosis not present

## 2018-09-19 DIAGNOSIS — R339 Retention of urine, unspecified: Secondary | ICD-10-CM | POA: Diagnosis not present

## 2018-09-19 DIAGNOSIS — Z681 Body mass index (BMI) 19 or less, adult: Secondary | ICD-10-CM | POA: Diagnosis not present

## 2018-09-19 DIAGNOSIS — N319 Neuromuscular dysfunction of bladder, unspecified: Secondary | ICD-10-CM | POA: Diagnosis not present

## 2018-09-19 DIAGNOSIS — Z7689 Persons encountering health services in other specified circumstances: Secondary | ICD-10-CM | POA: Diagnosis not present

## 2018-09-19 DIAGNOSIS — S065X9A Traumatic subdural hemorrhage with loss of consciousness of unspecified duration, initial encounter: Secondary | ICD-10-CM | POA: Diagnosis not present

## 2018-09-19 DIAGNOSIS — I10 Essential (primary) hypertension: Secondary | ICD-10-CM | POA: Diagnosis not present

## 2018-09-23 DIAGNOSIS — G300 Alzheimer's disease with early onset: Secondary | ICD-10-CM | POA: Diagnosis not present

## 2018-09-23 DIAGNOSIS — D631 Anemia in chronic kidney disease: Secondary | ICD-10-CM | POA: Diagnosis not present

## 2018-09-23 DIAGNOSIS — N183 Chronic kidney disease, stage 3 (moderate): Secondary | ICD-10-CM | POA: Diagnosis not present

## 2018-09-23 DIAGNOSIS — N10 Acute tubulo-interstitial nephritis: Secondary | ICD-10-CM | POA: Diagnosis not present

## 2018-09-23 DIAGNOSIS — R69 Illness, unspecified: Secondary | ICD-10-CM | POA: Diagnosis not present

## 2018-09-23 DIAGNOSIS — N319 Neuromuscular dysfunction of bladder, unspecified: Secondary | ICD-10-CM | POA: Diagnosis not present

## 2018-09-23 DIAGNOSIS — J439 Emphysema, unspecified: Secondary | ICD-10-CM | POA: Diagnosis not present

## 2018-09-23 DIAGNOSIS — G40802 Other epilepsy, not intractable, without status epilepticus: Secondary | ICD-10-CM | POA: Diagnosis not present

## 2018-09-23 DIAGNOSIS — K219 Gastro-esophageal reflux disease without esophagitis: Secondary | ICD-10-CM | POA: Diagnosis not present

## 2018-09-23 DIAGNOSIS — I1310 Hypertensive heart and chronic kidney disease without heart failure, with stage 1 through stage 4 chronic kidney disease, or unspecified chronic kidney disease: Secondary | ICD-10-CM | POA: Diagnosis not present

## 2018-09-24 DIAGNOSIS — S065X9A Traumatic subdural hemorrhage with loss of consciousness of unspecified duration, initial encounter: Secondary | ICD-10-CM | POA: Diagnosis not present

## 2018-09-26 DIAGNOSIS — K219 Gastro-esophageal reflux disease without esophagitis: Secondary | ICD-10-CM | POA: Diagnosis not present

## 2018-09-26 DIAGNOSIS — N10 Acute tubulo-interstitial nephritis: Secondary | ICD-10-CM | POA: Diagnosis not present

## 2018-09-26 DIAGNOSIS — D631 Anemia in chronic kidney disease: Secondary | ICD-10-CM | POA: Diagnosis not present

## 2018-09-26 DIAGNOSIS — J439 Emphysema, unspecified: Secondary | ICD-10-CM | POA: Diagnosis not present

## 2018-09-26 DIAGNOSIS — N183 Chronic kidney disease, stage 3 (moderate): Secondary | ICD-10-CM | POA: Diagnosis not present

## 2018-09-26 DIAGNOSIS — G40802 Other epilepsy, not intractable, without status epilepticus: Secondary | ICD-10-CM | POA: Diagnosis not present

## 2018-09-26 DIAGNOSIS — R69 Illness, unspecified: Secondary | ICD-10-CM | POA: Diagnosis not present

## 2018-09-26 DIAGNOSIS — N319 Neuromuscular dysfunction of bladder, unspecified: Secondary | ICD-10-CM | POA: Diagnosis not present

## 2018-09-26 DIAGNOSIS — I1310 Hypertensive heart and chronic kidney disease without heart failure, with stage 1 through stage 4 chronic kidney disease, or unspecified chronic kidney disease: Secondary | ICD-10-CM | POA: Diagnosis not present

## 2018-09-26 DIAGNOSIS — G300 Alzheimer's disease with early onset: Secondary | ICD-10-CM | POA: Diagnosis not present

## 2018-09-30 DIAGNOSIS — I1310 Hypertensive heart and chronic kidney disease without heart failure, with stage 1 through stage 4 chronic kidney disease, or unspecified chronic kidney disease: Secondary | ICD-10-CM | POA: Diagnosis not present

## 2018-09-30 DIAGNOSIS — K219 Gastro-esophageal reflux disease without esophagitis: Secondary | ICD-10-CM | POA: Diagnosis not present

## 2018-09-30 DIAGNOSIS — J439 Emphysema, unspecified: Secondary | ICD-10-CM | POA: Diagnosis not present

## 2018-09-30 DIAGNOSIS — G40802 Other epilepsy, not intractable, without status epilepticus: Secondary | ICD-10-CM | POA: Diagnosis not present

## 2018-09-30 DIAGNOSIS — N10 Acute tubulo-interstitial nephritis: Secondary | ICD-10-CM | POA: Diagnosis not present

## 2018-09-30 DIAGNOSIS — G300 Alzheimer's disease with early onset: Secondary | ICD-10-CM | POA: Diagnosis not present

## 2018-09-30 DIAGNOSIS — N183 Chronic kidney disease, stage 3 (moderate): Secondary | ICD-10-CM | POA: Diagnosis not present

## 2018-09-30 DIAGNOSIS — R69 Illness, unspecified: Secondary | ICD-10-CM | POA: Diagnosis not present

## 2018-09-30 DIAGNOSIS — D631 Anemia in chronic kidney disease: Secondary | ICD-10-CM | POA: Diagnosis not present

## 2018-09-30 DIAGNOSIS — N319 Neuromuscular dysfunction of bladder, unspecified: Secondary | ICD-10-CM | POA: Diagnosis not present

## 2018-10-02 ENCOUNTER — Other Ambulatory Visit: Payer: Self-pay | Admitting: Neurosurgery

## 2018-10-02 DIAGNOSIS — S065XAA Traumatic subdural hemorrhage with loss of consciousness status unknown, initial encounter: Secondary | ICD-10-CM

## 2018-10-02 DIAGNOSIS — S065X9A Traumatic subdural hemorrhage with loss of consciousness of unspecified duration, initial encounter: Secondary | ICD-10-CM

## 2018-10-13 ENCOUNTER — Ambulatory Visit: Payer: Medicare HMO | Admitting: Adult Health

## 2018-10-13 ENCOUNTER — Encounter: Payer: Self-pay | Admitting: Adult Health

## 2018-10-13 VITALS — BP 160/78 | HR 72 | Ht 61.0 in | Wt 99.4 lb

## 2018-10-13 DIAGNOSIS — F0390 Unspecified dementia without behavioral disturbance: Secondary | ICD-10-CM | POA: Diagnosis not present

## 2018-10-13 DIAGNOSIS — R69 Illness, unspecified: Secondary | ICD-10-CM | POA: Diagnosis not present

## 2018-10-13 DIAGNOSIS — Z5181 Encounter for therapeutic drug level monitoring: Secondary | ICD-10-CM | POA: Diagnosis not present

## 2018-10-13 DIAGNOSIS — R569 Unspecified convulsions: Secondary | ICD-10-CM | POA: Diagnosis not present

## 2018-10-13 DIAGNOSIS — N390 Urinary tract infection, site not specified: Secondary | ICD-10-CM | POA: Diagnosis not present

## 2018-10-13 MED ORDER — PHENYTOIN SODIUM EXTENDED 100 MG PO CAPS: 200.0000 mg | ORAL_CAPSULE | Freq: Every day | ORAL | 3 refills | Status: DC

## 2018-10-13 NOTE — Patient Instructions (Signed)
Your Plan:  Continue Aricept and Namenda Follow- up with PCP for depression Continue Dilantin Blood work today If your symptoms worsen or you develop new symptoms please let us know.       Thank you for coming to see [place] at [facility]. I hope we have been able to provide you high quality care today.  You may receive a patient satisfaction survey over the next few weeks. We would appreciate your feedback and comments so that we may continue to improve ourselves and the health of our patients.

## 2018-10-13 NOTE — Progress Notes (Signed)
PATIENT: [name] DOB: 1951-07-12  REASON FOR VISIT: follow up HISTORY FROM: patient  HISTORY OF PRESENT ILLNESS: Today 10/13/18 :  [name] is a 67 year old female with a history of dementia, insomnia and seizures.  [name] returns today for follow-up.  [name] currently lives at home with her son.  Her husband passed away last year.  [name] is able to complete all ADLs independently.  [name] no longer does any cooking.  Reports that [name] does not sleep well.  [name] wakes up frequently at night.  [name] does not sleep during the day.  Her son manages her finances, medications and appointments.  He also has a friend that helps.  The patient is never left at home alone.  The patient does not operate a motor vehicle.  [name] only travels outside of the home when [name] is with her son.  The patient and her son feel that [name] is more depressed since her husband passed.  [name] is able to feed herself.  [name] remains on oxycodone for back pain related to bladder infections and kidney stone.  The patient reports that [name] has not had any recent seizures.  Her son reports that her last seizure was in December 2018.  He feels that this may be related to the death of her husband.  [name] remains on Dilantin 200 mg at bedtime.  In the past [name] has tried Unisom for sleep but it did not really offer her much benefit.  [name] returns today for evaluation.   HISTORY June 04 2017 [name] is a 67 year old female with a history of dementia, insomnia and seizures. [name] returns today for follow-up. [name] reports that [name] feels that her memory may be slightly worse. [name] is currently living with her granddaughter. [name] is able to complete all ADLs independently. [name] does require some assistance getting in and out of the bathtub. [name] does not operate a motor vehicle. [name] no longer handles her own finances or does any cooking. [name] remains on Aricept and Namenda and is tolerating those medications well. [name] is on Dilantin 200 mg at bedtime. [name] denies any  seizure events. [name] has had ongoing trouble with her gait and balance. [name] uses a cane when ambulating. Granddaughter reports that [name] had a fall one month ago. [name] suffered an abrasion over the left eye. The patient admits that [name] does not always use her cane. [name] does have an indwelling catheter. [name] returns today for an evaluation.  REVIEW OF SYSTEMS: Out of a complete 14 system review of symptoms, the patient complains only of the following symptoms, and all other reviewed systems are negative.  Activity change, appetite change, fatigue, memory loss, headache, speech difficulty, facial, behavior problem, confusion, depression,  walking difficulty, itching  ALLERGIES: Allergies  Allergen Reactions  . Codeine Nausea And Vomiting    REACTION: intolerance  . Latex     itching    HOME MEDICATIONS: Outpatient Medications Prior to Visit  Medication Sig Dispense Refill  . amLODipine (NORVASC) 2.5 MG tablet Take 2.5 mg by mouth every evening.    Marland Kitchen atorvastatin (LIPITOR) 10 MG tablet Take 10 mg by mouth daily.     . carvedilol (COREG) 12.5 MG tablet   10  . citalopram (CELEXA) 40 MG tablet Take 1 tablet (40 mg total) by mouth daily. 90 tablet 1  . clopidogrel (PLAVIX) 75 MG tablet Take 75 mg by mouth daily.    Marland Kitchen donepezil (ARICEPT) 10 MG tablet TAKE 1 BY MOUTH  AT BEDTIME 90 tablet 1  . linezolid (ZYVOX) 600 MG tablet Take 600 mg by mouth 2 (two) times daily. PRESCRIPTION WAS CALLED IN BY HER UROLOGIST DR. CHAO AT [facility]. NOT PICKED UP AS OF THIS ADMISSION.    . memantine (NAMENDA) 10 MG tablet Take 1 tablet (10 mg total) by mouth 2 (two) times daily. 180 tablet 1  . oxyCODONE (OXY IR/ROXICODONE) 5 MG immediate release tablet Take 2.5 mg by mouth every 8 (eight) hours as needed (pain).    . phenytoin (DILANTIN) 100 MG ER capsule Take 2 capsules (200 mg total) by mouth at bedtime. (Patient taking differently: Take 100 mg by mouth 2 (two) times daily. ) 180 capsule 3  . venlafaxine XR  (EFFEXOR-XR) 150 MG 24 hr capsule Take 150 mg by mouth daily with breakfast.     Facility-Administered Medications Prior to Visit  Medication Dose Route Frequency Provider Last Rate Last Dose  . doxylamine (Sleep) (UNISOM) tablet 25 mg  25 mg Oral QHS PRN Dohmeier, Asencion Partridge, MD        PAST MEDICAL HISTORY: Past Medical History:  Diagnosis Date  . Alzheimer's dementia    takes Aricept nightly  . Anemia   . COPD (chronic obstructive pulmonary disease) ([place])   . History of blood transfusion    received 2units on Feb 17,2015  . History of MRSA infection    several yrs ago  . HLD (hyperlipidemia) 06/06/2018  . HTN (hypertension) 06/06/2018  . Hyperlipidemia    takes Atorvastatin daily  . Hypertension    takes Amlodipine and Carvedilol daily  . Insomnia    takes Ambien nightly  . Renal disorder    kidney stones  . Seizures ([place])   . Subacute delirium 10/14/2013   takes Seroquel and Dilantin nightly  . Toxic encephalopathy   . UTI (lower urinary tract infection)    takes Macrodantin daily    PAST SURGICAL HISTORY: Past Surgical History:  Procedure Laterality Date  . ABDOMINAL ANGIOGRAM N/A 01/20/2014   Procedure: ABDOMINAL ANGIOGRAM;  Surgeon: Elam Dutch, MD;  [facility] LAB;  Service: Cardiovascular;  Laterality: N/A;  . ABDOMINAL HYSTERECTOMY    . AMPUTATION Left 02/08/2014   Procedure: AMPUTATION Left 4th & 5th toes;  Surgeon: Conrad New Iberia, MD;  Location: [place];  Service: Vascular;  Laterality: Left;  . BLADDER SURGERY    . CHOLECYSTECTOMY  1995   Gall Bladder  . INSERTION OF ILIAC STENT Left 01/26/2014   Procedure: INSERTION OF ILIAC STENT- LEFT COMMON ILIAC ARTERY STENT ;  Surgeon: Conrad Farmington, MD;  Location: [place];  Service: Vascular;  Laterality: Left;  . KNEE SURGERY      FAMILY HISTORY: Family History  Problem Relation Age of Onset  . Cancer Brother     SOCIAL HISTORY: Social History   Socioeconomic History  . Marital status: Married    Spouse  name: Not on file  . Number of children: Not on file  . Years of education: Not on file  . Highest education level: Not on file  Occupational History  . Not on file  Social Needs  . Financial resource strain: Not on file  . Food insecurity:    Worry: Not on file    Inability: Not on file  . Transportation needs:    Medical: Not on file    Non-medical: Not on file  Tobacco Use  . Smoking status: Current Every Day Smoker    Packs/day: 0.75  Types: Cigarettes  . Smokeless tobacco: Never Used  Substance and Sexual Activity  . Alcohol use: No  . Drug use: No  . Sexual activity: Not on file  Lifestyle  . Physical activity:    Days per week: Not on file    Minutes per session: Not on file  . Stress: Not on file  Relationships  . Social connections:    Talks on phone: Not on file    Gets together: Not on file    Attends religious service: Not on file    Active member of club or organization: Not on file    Attends meetings of clubs or organizations: Not on file    Relationship status: Not on file  . Intimate partner violence:    Fear of current or ex partner: Not on file    Emotionally abused: Not on file    Physically abused: Not on file    Forced sexual activity: Not on file  Other Topics Concern  . Not on file  Social History Narrative   Living with husband at home.        PHYSICAL EXAM  Vitals:   10/13/18 1039  BP: (!) 160/78  Pulse: 72  Weight: 99 lb 6.4 oz (45.1 kg)  Height: 5\' 1"  (1.549 m)   Body mass index is 18.78 kg/m.   MMSE - Mini Mental State Exam 10/13/2018 06/04/2017 11/28/2016  Orientation to time 2 3 3   Orientation to Place 3 3 2   Registration 3 3 3   Attention/ Calculation 0 0 0  Recall 1 1 0  Language- name 2 objects 2 2 2   Language- repeat 1 1 1   Language- follow 3 step command 2 3 3   Language- follow 3 step command-comments left paper on desk - -  Language- read & follow direction 1 1 1   Write a sentence 0 1 1  Write a  sentence-comments no subject - -  Copy design 1 1 1   Total score 16 19 17      Generalized: Well developed, in no acute distress   Neurological examination  Mentation: Alert.. Follows all commands speech and language fluent Cranial nerve II-XII: Pupils were equal round reactive to light. Extraocular movements were full, visual field were full on confrontational test. Facial sensation and strength were normal. Uvula tongue midline. Head turning and shoulder shrug  were normal and symmetric. Motor: The motor testing reveals 5 over 5 strength of all 4 extremities. Good symmetric motor tone is noted throughout.  Sensory: Sensory testing is intact to soft touch on all 4 extremities. No evidence of extinction is noted.  Coordination: Cerebellar testing reveals good finger-nose-finger and heel-to-shin bilaterally.  Gait and station: Gait is unsteady.  [name] uses a cane when ambulating.  Tandem gait not attempted. Reflexes: Deep tendon reflexes are symmetric and normal bilaterally.   DIAGNOSTIC DATA (LABS, IMAGING, TESTING) - I reviewed patient records, labs, notes, testing and imaging myself where available.  Lab Results  Component Value Date   WBC 12.5 (H) 06/09/2018   HGB 8.5 (L) 06/09/2018   HCT 27.7 (L) 06/09/2018   MCV 93.3 06/09/2018   PLT 153 06/09/2018      Component Value Date/Time   NA 139 06/09/2018 0636   NA 141 06/04/2017 0840   K 4.5 06/09/2018 0636   CL 103 06/09/2018 0636   CO2 26 06/09/2018 0636   GLUCOSE 108 (H) 06/09/2018 0636   BUN 16 06/09/2018 0636   BUN 20 06/04/2017 0840   CREATININE  1.27 (H) 06/09/2018 0636   CALCIUM 7.7 (L) 06/09/2018 0636   PROT 5.2 (L) 06/08/2018 0449   PROT 6.5 06/04/2017 0840   ALBUMIN 2.6 (L) 06/08/2018 0449   ALBUMIN 3.9 06/04/2017 0840   AST 17 06/08/2018 0449   ALT 9 06/08/2018 0449   ALKPHOS 107 06/08/2018 0449   BILITOT 0.4 06/08/2018 0449   BILITOT <0.2 06/04/2017 0840   GFRNONAA 43 (L) 06/09/2018 0636   GFRAA 50 (L)  06/09/2018 0636   Lab Results  Component Value Date   TRIG 224 (H) 11/23/2017   No results found for: HGBA1C Lab Results  Component Value Date   VITAMINB12 177 (L) 10/28/2008   Lab Results  Component Value Date   TSH 1.68 10/28/2008      ASSESSMENT AND PLAN 67 y.o. year old female  has a past medical history of Alzheimer's dementia, Anemia, COPD (chronic obstructive pulmonary disease) ([place]), History of blood transfusion, History of MRSA infection, HLD (hyperlipidemia) (06/06/2018), HTN (hypertension) (06/06/2018), Hyperlipidemia, Hypertension, Insomnia, Renal disorder, Seizures ([place]), Subacute delirium (10/14/2013), Toxic encephalopathy, and UTI (lower urinary tract infection). here with :  1.  Dementia 2.  Seizures  The patient's memory score has decreased slightly since last visit.  [name] will continue on Aricept and Namenda.  The patient reports that [name] has not had any recent seizure events.  I will check Dilantin level today.  [name] will continue on Dilantin 200 mg at bedtime.  I encouraged the patient to follow-up with primary care in regards to depression.  Advised that if her symptoms worsen or [name] develops new symptoms [name] should let us know.  [name] will follow-up in 6 months or sooner if needed.    Ward Givens, MSN, NP-C 10/13/2018, 10:30 AM [facility] [address] [place], [place] [address] [phone]

## 2018-10-13 NOTE — Progress Notes (Signed)
I have read the note, and I agree with the clinical assessment and plan.  Madilynn Montante K Kelcee Bjorn   

## 2018-10-14 ENCOUNTER — Telehealth: Payer: Self-pay | Admitting: *Deleted

## 2018-10-14 DIAGNOSIS — N183 Chronic kidney disease, stage 3 (moderate): Secondary | ICD-10-CM | POA: Diagnosis not present

## 2018-10-14 DIAGNOSIS — R69 Illness, unspecified: Secondary | ICD-10-CM | POA: Diagnosis not present

## 2018-10-14 DIAGNOSIS — N10 Acute tubulo-interstitial nephritis: Secondary | ICD-10-CM | POA: Diagnosis not present

## 2018-10-14 DIAGNOSIS — I1310 Hypertensive heart and chronic kidney disease without heart failure, with stage 1 through stage 4 chronic kidney disease, or unspecified chronic kidney disease: Secondary | ICD-10-CM | POA: Diagnosis not present

## 2018-10-14 DIAGNOSIS — G40802 Other epilepsy, not intractable, without status epilepticus: Secondary | ICD-10-CM | POA: Diagnosis not present

## 2018-10-14 DIAGNOSIS — N319 Neuromuscular dysfunction of bladder, unspecified: Secondary | ICD-10-CM | POA: Diagnosis not present

## 2018-10-14 DIAGNOSIS — G300 Alzheimer's disease with early onset: Secondary | ICD-10-CM | POA: Diagnosis not present

## 2018-10-14 DIAGNOSIS — K219 Gastro-esophageal reflux disease without esophagitis: Secondary | ICD-10-CM | POA: Diagnosis not present

## 2018-10-14 DIAGNOSIS — J439 Emphysema, unspecified: Secondary | ICD-10-CM | POA: Diagnosis not present

## 2018-10-14 DIAGNOSIS — D631 Anemia in chronic kidney disease: Secondary | ICD-10-CM | POA: Diagnosis not present

## 2018-10-14 LAB — COMPREHENSIVE METABOLIC PANEL
A/G RATIO: 1.4 (ref 1.2–2.2)
ALBUMIN: 4.1 g/dL (ref 3.6–4.8)
ALK PHOS: 241 IU/L — AB (ref 39–117)
ALT: 10 IU/L (ref 0–32)
AST: 16 IU/L (ref 0–40)
BUN / CREAT RATIO: 10 — AB (ref 12–28)
BUN: 15 mg/dL (ref 8–27)
Bilirubin Total: <0.2 mg/dL (ref 0.0–1.2)
CALCIUM: 8.3 mg/dL — AB (ref 8.7–10.3)
CO2: 14 mmol/L — AB (ref 20–29)
CREATININE: 1.53 mg/dL — AB (ref 0.57–1.00)
Chloride: 109 mmol/L — ABNORMAL HIGH (ref 96–106)
GFR calc Af Amer: 40 mL/min/{1.73_m2} — ABNORMAL LOW (ref >59)
GFR, EST NON AFRICAN AMERICAN: 35 mL/min/{1.73_m2} — AB (ref >59)
Globulin, Total: 3 g/dL (ref 1.5–4.5)
Glucose: 83 mg/dL (ref 65–99)
Potassium: 5 mmol/L (ref 3.5–5.2)
SODIUM: 138 mmol/L (ref 134–144)
Total Protein: 7.1 g/dL (ref 6.0–8.5)

## 2018-10-14 LAB — CBC WITH DIFFERENTIAL/PLATELET
BASOS: 1 %
Basophils Absolute: 0.1 10*3/uL (ref 0.0–0.2)
EOS (ABSOLUTE): 0.2 10*3/uL (ref 0.0–0.4)
EOS: 2 %
HEMATOCRIT: 35.6 % (ref 34.0–46.6)
Hemoglobin: 11 g/dL — ABNORMAL LOW (ref 11.1–15.9)
Immature Grans (Abs): 0 10*3/uL (ref 0.0–0.1)
Immature Granulocytes: 0 %
LYMPHS ABS: 2.3 10*3/uL (ref 0.7–3.1)
Lymphs: 27 %
MCH: 26.8 pg (ref 26.6–33.0)
MCHC: 30.9 g/dL — AB (ref 31.5–35.7)
MCV: 87 fL (ref 79–97)
MONOS ABS: 0.5 10*3/uL (ref 0.1–0.9)
Monocytes: 5 %
Neutrophils Absolute: 5.5 10*3/uL (ref 1.4–7.0)
Neutrophils: 65 %
Platelets: 204 10*3/uL (ref 150–450)
RBC: 4.1 x10E6/uL (ref 3.77–5.28)
RDW: 13.6 % (ref 12.3–15.4)
WBC: 8.5 10*3/uL (ref 3.4–10.8)

## 2018-10-14 LAB — PHENYTOIN LEVEL, TOTAL: Phenytoin (Dilantin), Serum: 7.2 ug/mL — ABNORMAL LOW (ref 10.0–20.0)

## 2018-10-14 NOTE — Telephone Encounter (Signed)
VA forms completed, signed and sent to medical records for processing.

## 2018-10-14 NOTE — Telephone Encounter (Signed)
Received form from patient re: VA form for survivor- Examination for housebound status or permanent need for regular aid and attendance.

## 2018-10-15 ENCOUNTER — Telehealth: Payer: Self-pay | Admitting: *Deleted

## 2018-10-15 ENCOUNTER — Ambulatory Visit: Payer: Medicare Other | Admitting: Adult Health

## 2018-10-15 NOTE — Telephone Encounter (Signed)
Pt VA form @ the front desk for p/u.

## 2018-10-21 ENCOUNTER — Telehealth: Payer: Self-pay | Admitting: *Deleted

## 2018-10-21 NOTE — Telephone Encounter (Signed)
Spoke with patient and informed her that her blood work is consistent with previous blood work. She does have abnormal kidney function which she has had in past. Advised her this RN sent results to her primary care provider, Dr Marco Collie to review. Informed her that her Dilantin level was low, but she has not had any recent seizures. Megan NP will make no change in her medication dose. She asked if she has refills; this RN advised it was refilled on 10/13/18 to [facility].  She asked about kidney function, and this RN advised it is consistent with previous labs, so her PCP can address and advise of any treatments if needed. The patient verbalized understanding, appreciation.

## 2018-10-23 ENCOUNTER — Inpatient Hospital Stay: Payer: Medicare HMO

## 2018-10-23 ENCOUNTER — Inpatient Hospital Stay (HOSPITAL_COMMUNITY)
Admission: AD | Admit: 2018-10-23 | Discharge: 2018-10-31 | DRG: 444 | Disposition: A | Discharge status: Home Health | Payer: Medicare HMO | Source: Other Acute Inpatient Hospital | Attending: Internal Medicine | Admitting: Internal Medicine

## 2018-10-23 ENCOUNTER — Inpatient Hospital Stay (HOSPITAL_COMMUNITY): Payer: Medicare HMO

## 2018-10-23 ENCOUNTER — Encounter (HOSPITAL_COMMUNITY): Payer: Self-pay | Admitting: Gastroenterology

## 2018-10-23 DIAGNOSIS — F0280 Dementia in other diseases classified elsewhere without behavioral disturbance: Secondary | ICD-10-CM | POA: Diagnosis not present

## 2018-10-23 DIAGNOSIS — Z885 Allergy status to narcotic agent status: Secondary | ICD-10-CM | POA: Diagnosis not present

## 2018-10-23 DIAGNOSIS — K8000 Calculus of gallbladder with acute cholecystitis without obstruction: Secondary | ICD-10-CM | POA: Insufficient documentation

## 2018-10-23 DIAGNOSIS — R112 Nausea with vomiting, unspecified: Secondary | ICD-10-CM | POA: Diagnosis not present

## 2018-10-23 DIAGNOSIS — N183 Chronic kidney disease, stage 3 (moderate): Secondary | ICD-10-CM | POA: Diagnosis not present

## 2018-10-23 DIAGNOSIS — D619 Aplastic anemia, unspecified: Secondary | ICD-10-CM | POA: Diagnosis present

## 2018-10-23 DIAGNOSIS — G309 Alzheimer's disease, unspecified: Secondary | ICD-10-CM | POA: Diagnosis present

## 2018-10-23 DIAGNOSIS — G301 Alzheimer's disease with late onset: Secondary | ICD-10-CM | POA: Diagnosis not present

## 2018-10-23 DIAGNOSIS — F17210 Nicotine dependence, cigarettes, uncomplicated: Secondary | ICD-10-CM | POA: Diagnosis present

## 2018-10-23 DIAGNOSIS — Z79891 Long term (current) use of opiate analgesic: Secondary | ICD-10-CM

## 2018-10-23 DIAGNOSIS — Z89422 Acquired absence of other left toe(s): Secondary | ICD-10-CM

## 2018-10-23 DIAGNOSIS — R569 Unspecified convulsions: Secondary | ICD-10-CM | POA: Diagnosis not present

## 2018-10-23 DIAGNOSIS — Z87440 Personal history of urinary (tract) infections: Secondary | ICD-10-CM

## 2018-10-23 DIAGNOSIS — K219 Gastro-esophageal reflux disease without esophagitis: Secondary | ICD-10-CM | POA: Diagnosis present

## 2018-10-23 DIAGNOSIS — R740 Nonspecific elevation of levels of transaminase and lactic acid dehydrogenase [LDH]: Secondary | ICD-10-CM | POA: Diagnosis not present

## 2018-10-23 DIAGNOSIS — Z9049 Acquired absence of other specified parts of digestive tract: Secondary | ICD-10-CM | POA: Diagnosis not present

## 2018-10-23 DIAGNOSIS — R5381 Other malaise: Secondary | ICD-10-CM | POA: Diagnosis present

## 2018-10-23 DIAGNOSIS — Z87442 Personal history of urinary calculi: Secondary | ICD-10-CM

## 2018-10-23 DIAGNOSIS — K8020 Calculus of gallbladder without cholecystitis without obstruction: Secondary | ICD-10-CM | POA: Diagnosis not present

## 2018-10-23 DIAGNOSIS — Z91040 Latex allergy status: Secondary | ICD-10-CM | POA: Diagnosis not present

## 2018-10-23 DIAGNOSIS — I739 Peripheral vascular disease, unspecified: Secondary | ICD-10-CM | POA: Diagnosis present

## 2018-10-23 DIAGNOSIS — R69 Illness, unspecified: Secondary | ICD-10-CM | POA: Diagnosis not present

## 2018-10-23 DIAGNOSIS — J449 Chronic obstructive pulmonary disease, unspecified: Secondary | ICD-10-CM | POA: Diagnosis present

## 2018-10-23 DIAGNOSIS — I129 Hypertensive chronic kidney disease with stage 1 through stage 4 chronic kidney disease, or unspecified chronic kidney disease: Secondary | ICD-10-CM | POA: Diagnosis present

## 2018-10-23 DIAGNOSIS — R109 Unspecified abdominal pain: Secondary | ICD-10-CM | POA: Diagnosis not present

## 2018-10-23 DIAGNOSIS — Z8614 Personal history of Methicillin resistant Staphylococcus aureus infection: Secondary | ICD-10-CM

## 2018-10-23 DIAGNOSIS — K8062 Calculus of gallbladder and bile duct with acute cholecystitis without obstruction: Secondary | ICD-10-CM | POA: Diagnosis not present

## 2018-10-23 DIAGNOSIS — G4700 Insomnia, unspecified: Secondary | ICD-10-CM | POA: Diagnosis present

## 2018-10-23 DIAGNOSIS — N179 Acute kidney failure, unspecified: Secondary | ICD-10-CM | POA: Diagnosis not present

## 2018-10-23 DIAGNOSIS — K819 Cholecystitis, unspecified: Secondary | ICD-10-CM

## 2018-10-23 DIAGNOSIS — I10 Essential (primary) hypertension: Secondary | ICD-10-CM | POA: Diagnosis present

## 2018-10-23 DIAGNOSIS — K9189 Other postprocedural complications and disorders of digestive system: Secondary | ICD-10-CM | POA: Diagnosis not present

## 2018-10-23 DIAGNOSIS — B9689 Other specified bacterial agents as the cause of diseases classified elsewhere: Secondary | ICD-10-CM | POA: Diagnosis not present

## 2018-10-23 DIAGNOSIS — E785 Hyperlipidemia, unspecified: Secondary | ICD-10-CM | POA: Diagnosis present

## 2018-10-23 DIAGNOSIS — K8050 Calculus of bile duct without cholangitis or cholecystitis without obstruction: Principal | ICD-10-CM | POA: Diagnosis present

## 2018-10-23 DIAGNOSIS — K859 Acute pancreatitis without necrosis or infection, unspecified: Secondary | ICD-10-CM | POA: Diagnosis present

## 2018-10-23 DIAGNOSIS — D509 Iron deficiency anemia, unspecified: Secondary | ICD-10-CM | POA: Diagnosis present

## 2018-10-23 DIAGNOSIS — E872 Acidosis: Secondary | ICD-10-CM | POA: Diagnosis present

## 2018-10-23 DIAGNOSIS — G40909 Epilepsy, unspecified, not intractable, without status epilepticus: Secondary | ICD-10-CM | POA: Diagnosis not present

## 2018-10-23 DIAGNOSIS — T85520A Displacement of bile duct prosthesis, initial encounter: Secondary | ICD-10-CM

## 2018-10-23 DIAGNOSIS — D631 Anemia in chronic kidney disease: Secondary | ICD-10-CM | POA: Diagnosis present

## 2018-10-23 DIAGNOSIS — K839 Disease of biliary tract, unspecified: Secondary | ICD-10-CM | POA: Diagnosis not present

## 2018-10-23 DIAGNOSIS — K831 Obstruction of bile duct: Secondary | ICD-10-CM | POA: Diagnosis not present

## 2018-10-23 DIAGNOSIS — Z95820 Peripheral vascular angioplasty status with implants and grafts: Secondary | ICD-10-CM | POA: Diagnosis not present

## 2018-10-23 DIAGNOSIS — Z23 Encounter for immunization: Secondary | ICD-10-CM | POA: Diagnosis not present

## 2018-10-23 DIAGNOSIS — E876 Hypokalemia: Secondary | ICD-10-CM | POA: Diagnosis present

## 2018-10-23 DIAGNOSIS — N3000 Acute cystitis without hematuria: Secondary | ICD-10-CM | POA: Diagnosis not present

## 2018-10-23 DIAGNOSIS — Z79899 Other long term (current) drug therapy: Secondary | ICD-10-CM

## 2018-10-23 DIAGNOSIS — R1011 Right upper quadrant pain: Secondary | ICD-10-CM | POA: Diagnosis not present

## 2018-10-23 DIAGNOSIS — K8040 Calculus of bile duct with cholecystitis, unspecified, without obstruction: Secondary | ICD-10-CM | POA: Diagnosis not present

## 2018-10-23 DIAGNOSIS — K8070 Calculus of gallbladder and bile duct without cholecystitis without obstruction: Secondary | ICD-10-CM | POA: Diagnosis not present

## 2018-10-23 DIAGNOSIS — R933 Abnormal findings on diagnostic imaging of other parts of digestive tract: Secondary | ICD-10-CM | POA: Diagnosis not present

## 2018-10-23 DIAGNOSIS — Z90710 Acquired absence of both cervix and uterus: Secondary | ICD-10-CM

## 2018-10-23 LAB — CBC WITH DIFFERENTIAL/PLATELET
ABS IMMATURE GRANULOCYTES: 0.02 10*3/uL (ref 0.00–0.07)
BASOS ABS: 0.1 10*3/uL (ref 0.0–0.1)
Basophils Relative: 1 %
Eosinophils Absolute: 0.1 10*3/uL (ref 0.0–0.5)
Eosinophils Relative: 1 %
HCT: 35 % — ABNORMAL LOW (ref 36.0–46.0)
HEMOGLOBIN: 10.2 g/dL — AB (ref 12.0–15.0)
IMMATURE GRANULOCYTES: 0 %
LYMPHS PCT: 30 %
Lymphs Abs: 2.5 10*3/uL (ref 0.7–4.0)
MCH: 27.3 pg (ref 26.0–34.0)
MCHC: 29.1 g/dL — ABNORMAL LOW (ref 30.0–36.0)
MCV: 93.6 fL (ref 80.0–100.0)
Monocytes Absolute: 0.8 10*3/uL (ref 0.1–1.0)
Monocytes Relative: 9 %
NRBC: 0 % (ref 0.0–0.2)
Neutro Abs: 4.9 10*3/uL (ref 1.7–7.7)
Neutrophils Relative %: 59 %
Platelets: 157 10*3/uL (ref 150–400)
RBC: 3.74 MIL/uL — AB (ref 3.87–5.11)
RDW: 14.7 % (ref 11.5–15.5)
WBC: 8.3 10*3/uL (ref 4.0–10.5)

## 2018-10-23 LAB — COMPREHENSIVE METABOLIC PANEL
ALBUMIN: 3.3 g/dL — AB (ref 3.5–5.0)
ALT: 11 U/L (ref 0–44)
AST: 16 U/L (ref 15–41)
Alkaline Phosphatase: 166 U/L — ABNORMAL HIGH (ref 38–126)
Anion gap: 7 (ref 5–15)
BILIRUBIN TOTAL: 0.2 mg/dL — AB (ref 0.3–1.2)
BUN: 15 mg/dL (ref 8–23)
CO2: 16 mmol/L — ABNORMAL LOW (ref 22–32)
Calcium: 8.3 mg/dL — ABNORMAL LOW (ref 8.9–10.3)
Chloride: 115 mmol/L — ABNORMAL HIGH (ref 98–111)
Creatinine, Ser: 1.42 mg/dL — ABNORMAL HIGH (ref 0.44–1.00)
GFR calc Af Amer: 43 mL/min — ABNORMAL LOW (ref >60)
GFR calc non Af Amer: 37 mL/min — ABNORMAL LOW (ref >60)
GLUCOSE: 87 mg/dL (ref 70–99)
POTASSIUM: 4.6 mmol/L (ref 3.5–5.1)
Sodium: 138 mmol/L (ref 135–145)
Total Protein: 6.4 g/dL — ABNORMAL LOW (ref 6.5–8.1)

## 2018-10-23 LAB — URINALYSIS, ROUTINE W REFLEX MICROSCOPIC
BILIRUBIN URINE: NEGATIVE
Glucose, UA: NEGATIVE mg/dL
Ketones, ur: NEGATIVE mg/dL
Nitrite: NEGATIVE
PH: 6 (ref 5.0–8.0)
Protein, ur: 30 mg/dL — AB
SPECIFIC GRAVITY, URINE: 1.012 (ref 1.005–1.030)
WBC, UA: >50 WBC/hpf — ABNORMAL HIGH (ref 0–5)

## 2018-10-23 LAB — MAGNESIUM: Magnesium: 2.1 mg/dL (ref 1.7–2.4)

## 2018-10-23 LAB — PROTIME-INR
INR: 1.08
PROTHROMBIN TIME: 13.9 s (ref 11.4–15.2)

## 2018-10-23 LAB — LIPASE, BLOOD: Lipase: 33 U/L (ref 11–51)

## 2018-10-23 LAB — AMYLASE: Amylase: 97 U/L (ref 28–100)

## 2018-10-23 MED ORDER — DONEPEZIL HCL 10 MG PO TABS
10.0000 mg | ORAL_TABLET | Freq: Every day | ORAL | Status: DC
  Administered 2018-10-23 – 2018-10-30 (×8): 10 mg via ORAL
  Filled 2018-10-23 (×8): qty 1

## 2018-10-23 MED ORDER — HYDROMORPHONE HCL 1 MG/ML IJ SOLN
0.5000 mg | INTRAMUSCULAR | Status: DC | PRN
  Administered 2018-10-23 – 2018-10-26 (×10): 0.5 mg via INTRAVENOUS
  Filled 2018-10-23 (×11): qty 1

## 2018-10-23 MED ORDER — ATORVASTATIN CALCIUM 10 MG PO TABS
10.0000 mg | ORAL_TABLET | Freq: Every day | ORAL | Status: DC
  Administered 2018-10-24 – 2018-10-31 (×8): 10 mg via ORAL
  Filled 2018-10-23 (×8): qty 1

## 2018-10-23 MED ORDER — CARVEDILOL 6.25 MG PO TABS
6.2500 mg | ORAL_TABLET | Freq: Two times a day (BID) | ORAL | Status: DC
  Administered 2018-10-23 – 2018-10-31 (×16): 6.25 mg via ORAL
  Filled 2018-10-23 (×17): qty 1

## 2018-10-23 MED ORDER — SODIUM CHLORIDE 0.9 % IV SOLN
INTRAVENOUS | Status: DC
  Administered 2018-10-23 – 2018-10-25 (×6): via INTRAVENOUS

## 2018-10-23 MED ORDER — PHENYTOIN SODIUM EXTENDED 100 MG PO CAPS
200.0000 mg | ORAL_CAPSULE | Freq: Every day | ORAL | Status: DC
  Administered 2018-10-23 – 2018-10-30 (×8): 200 mg via ORAL
  Filled 2018-10-23 (×8): qty 2

## 2018-10-23 MED ORDER — ENOXAPARIN SODIUM 30 MG/0.3ML ~~LOC~~ SOLN
30.0000 mg | SUBCUTANEOUS | Status: DC
  Administered 2018-10-23: 30 mg via SUBCUTANEOUS
  Filled 2018-10-23: qty 0.3

## 2018-10-23 MED ORDER — DOXAZOSIN MESYLATE 2 MG PO TABS
2.0000 mg | ORAL_TABLET | Freq: Every day | ORAL | Status: DC
  Administered 2018-10-23 – 2018-10-31 (×9): 2 mg via ORAL
  Filled 2018-10-23 (×9): qty 1

## 2018-10-23 MED ORDER — AMLODIPINE BESYLATE 2.5 MG PO TABS
2.5000 mg | ORAL_TABLET | Freq: Every evening | ORAL | Status: DC
  Administered 2018-10-23 – 2018-10-28 (×6): 2.5 mg via ORAL
  Filled 2018-10-23 (×6): qty 1

## 2018-10-23 MED ORDER — IPRATROPIUM-ALBUTEROL 0.5-2.5 (3) MG/3ML IN SOLN: 3.0000 mL | Freq: Four times a day (QID) | RESPIRATORY_TRACT | Status: DC | PRN

## 2018-10-23 MED ORDER — SODIUM CHLORIDE 0.9 % IV SOLN
1.0000 g | INTRAVENOUS | Status: DC
  Administered 2018-10-23 – 2018-10-27 (×5): 1 g via INTRAVENOUS
  Filled 2018-10-23 (×6): qty 10

## 2018-10-23 MED ORDER — VENLAFAXINE HCL ER 75 MG PO CP24
150.0000 mg | ORAL_CAPSULE | Freq: Every day | ORAL | Status: DC
  Administered 2018-10-24 – 2018-10-31 (×8): 150 mg via ORAL
  Filled 2018-10-23 (×8): qty 2

## 2018-10-23 NOTE — H&P (Signed)
History and Physical:    [name]   GHW:[id number] DOB: [birth year] DOA: 10/23/2018  Referring MD/provider: [facility] PCP: Marco Collie, MD   Patient coming from: Home  Chief Complaint: right upper quadrant pain  History of Present Illness:   [name] is an 67 y.o. 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 Pain was episodic and would resolve spontaneously but then recur. Today pain became so sharp that she was unable to take it so came to the emergency room.  At the [facility] emergency room patient was noted to have a 7 mm stone in the common bile duct. She was seen by general surgery who requested transfer here for possible ERCP.   At present patient states that she feels much better. Pain is managed. Review of systems is notable for some mild nausea with eating however no vomiting or diarrhea. Denies any recent fevers or chills or malaise.   Patient does feel that she is developing a urinary tract infection however as she gets these frequently. She knows what it feels like.  ROS:   ROS   Review of Systems: General: No fever, chills, weight changes Skin: No rashes, lesions, wounds Eyes: no discharge, redness, pain HENT: no ear pain, hearing loss, drainage, tinnitus Endocrine: no heat/cold intolerance, no polyuria Respiratory: no cough,, shortness of breath, hemoptysis Cardiovascular: No palpitations, chest pain CNS: No numbness, dizziness, headache   Past Medical History:   Past Medical History:  Diagnosis Date  . Alzheimer's dementia ([place])    takes Aricept nightly  . Anemia   . COPD (chronic obstructive pulmonary disease)  ([place])   . History of blood transfusion    received 2units on Feb 17,2015  . History of MRSA infection    several yrs ago  . HLD (hyperlipidemia) 06/06/2018  . HTN (hypertension) 06/06/2018  . Hyperlipidemia    takes Atorvastatin daily  . Hypertension    takes Amlodipine and Carvedilol daily  . Insomnia    takes Ambien nightly  . Memory loss   . Renal disorder    kidney stones  . Seizures ([place])   . Subacute delirium 10/14/2013   takes Seroquel and Dilantin nightly  . Toxic encephalopathy   . UTI (lower urinary tract infection)    takes Macrodantin daily    Past Surgical History:   Past Surgical History:  Procedure Laterality Date  . ABDOMINAL ANGIOGRAM N/A 01/20/2014   Procedure: ABDOMINAL ANGIOGRAM;  Surgeon: Elam Dutch, MD;  [facility] C[facility] LAB;  Service: Cardiovascular;  Laterality: N/A;  . ABDOMINAL HYSTERECTOMY    . AMPUTATION Left 02/08/2014   Procedure: AMPUTATION Left 4th & 5th toes;  Surgeon: Conrad Marathon, MD;  Location: [place];  Service: Vascular;  Laterality: Left;  . BLADDER SURGERY    . CHOLECYSTECTOMY  1995   Gall Bladder  . INSERTION OF ILIAC STENT Left 01/26/2014   Procedure: INSERTION OF ILIAC STENT- LEFT COMMON ILIAC ARTERY STENT ;  Surgeon: Conrad Gilbertown, MD;  Location: [place];  Service: Vascular;  Laterality: Left;  . KNEE SURGERY      Social History:   Social History   Socioeconomic History  . Marital  status: Married    Spouse name: Not on file  . Number of children: Not on file  . Years of education: 57  . Highest education level: Not on file  Occupational History  . Not on file  Social Needs  . Financial resource strain: Not on file  . Food insecurity:    Worry: Not on file    Inability: Not on file  . Transportation needs:    Medical: Not on file    Non-medical: Not on file  Tobacco Use  . Smoking status: Current Every Day Smoker    Packs/day: 1.00    Types: Cigarettes  . Smokeless tobacco: Never Used  Substance and Sexual  Activity  . Alcohol use: No  . Drug use: No  . Sexual activity: Not on file  Lifestyle  . Physical activity:    Days per week: Not on file    Minutes per session: Not on file  . Stress: Not on file  Relationships  . Social connections:    Talks on phone: Not on file    Gets together: Not on file    Attends religious service: Not on file    Active member of club or organization: Not on file    Attends meetings of clubs or organizations: Not on file    Relationship status: Not on file  . Intimate partner violence:    Fear of current or ex partner: Not on file    Emotionally abused: Not on file    Physically abused: Not on file    Forced sexual activity: Not on file  Other Topics Concern  . Not on file  Social History Narrative   10/13/18 Living with son, Larene Beach at home.  Caregiver during the day.    Allergies   Codeine and Latex  Family history:   Family History  Problem Relation Age of Onset  . Cancer Brother     Current Medications:   Prior to Admission medications   Medication Sig Start Date End Date Taking? Authorizing Provider  amLODipine (NORVASC) 2.5 MG tablet Take 2.5 mg by mouth every evening.   Yes [provider]  atorvastatin (LIPITOR) 20 MG tablet Take 20 mg by mouth daily.  12/06/13  Yes [provider]  carvedilol (COREG) 12.5 MG tablet Take 6.25 mg by mouth 2 (two) times daily with a meal.  03/26/16  Yes [provider]  donepezil (ARICEPT) 10 MG tablet TAKE 1 BY MOUTH AT BEDTIME Patient taking differently: Take 10 mg by mouth at bedtime.  10/01/17  Yes Dohmeier, Asencion Partridge, MD  doxazosin (CARDURA) 2 MG tablet Take 2 mg by mouth daily. 10/21/18  Yes [provider]  oxyCODONE (OXY IR/ROXICODONE) 5 MG immediate release tablet Take 2.5 mg by mouth every 8 (eight) hours as needed (pain).   Yes [provider]  phenytoin (DILANTIN) 100 MG ER capsule Take 2 capsules (200 mg total) by mouth at bedtime. 10/13/18  Yes  Ward Givens, NP  umeclidinium-vilanterol (ANORO ELLIPTA) 62.5-25 MCG/INH AEPB Inhale 1 puff into the lungs daily as needed (shortness of breath or wheezing).   Yes [provider]  venlafaxine XR (EFFEXOR-XR) 150 MG 24 hr capsule Take 150 mg by mouth daily with breakfast.   Yes [provider]    Physical Exam:   Vitals:   10/23/18 1343  BP: (!) 144/57  Pulse: 63  Resp: 17  Temp: 98.7 F (37.1 C)  TempSrc: Oral  SpO2: 100%  Weight: 45.1 kg  Height:  5\' 1"  (1.549 m)     Physical Exam: Blood pressure (!) 144/57, pulse 63, temperature 98.7 F (37.1 C), temperature source Oral, resp. rate 17, height 5\' 1"  (1.549 m), weight 45.1 kg, SpO2 100 %. Gen: well-appearing female lying in bed moving around easily in no acute distress. Eyes: Sclerae anicteric. Conjunctiva mildly injected. Neck: Supple, no jugular venous distention. Chest: Moderately good air entry bilaterally with no adventitious sounds.  CV: Distant, regular, no audible murmurs. Abdomen: NABS, soft, nondistended. She has some tenderness to moderate palpation of the right upper quadrant. Murphy's is positive. No rebound. No guarding. Extremities: No edema.  Skin: Warm and dry. No rashes, lesions or wounds. Neuro: Alert and oriented times 3; grossly nonfocal.   Data Review:    Labs: Basic Metabolic Panel: Recent Labs  Lab 10/23/18 1554  NA 138  K 4.6  CL 115*  CO2 16*  GLUCOSE 87  BUN 15  CREATININE 1.42*  CALCIUM 8.3*  MG 2.1   Liver Function Tests: Recent Labs  Lab 10/23/18 1554  AST 16  ALT 11  ALKPHOS 166*  BILITOT 0.2*  PROT 6.4*  ALBUMIN 3.3*   Recent Labs  Lab 10/23/18 1554  LIPASE 33  AMYLASE 97   No results for input(s): AMMONIA in the last 168 hours. CBC: Recent Labs  Lab 10/23/18 1554  WBC 8.3  NEUTROABS 4.9  HGB 10.2*  HCT 35.0*  MCV 93.6  PLT 157   Cardiac Enzymes: No results for input(s): CKTOTAL, CKMB, CKMBINDEX, TROPONINI in the last 168  hours.  BNP (last 3 results) No results for input(s): PROBNP in the last 8760 hours. CBG: No results for input(s): GLUCAP in the last 168 hours.  Urinalysis    Component Value Date/Time   COLORURINE YELLOW 06/08/2018 0944   APPEARANCEUR HAZY (A) 06/08/2018 0944   LABSPEC 1.009 06/08/2018 0944   PHURINE 7.0 06/08/2018 0944   GLUCOSEU NEGATIVE 06/08/2018 0944   HGBUR SMALL (A) 06/08/2018 0944   BILIRUBINUR NEGATIVE 06/08/2018 0944   KETONESUR NEGATIVE 06/08/2018 0944   PROTEINUR NEGATIVE 06/08/2018 0944   UROBILINOGEN 0.2 01/24/2014 0500   NITRITE POSITIVE (A) 06/08/2018 0944   LEUKOCYTESUR LARGE (A) 06/08/2018 0944      Radiographic Studies: X-ray Chest Pa And Lateral  Result Date: 10/23/2018 CLINICAL DATA:  Cholecystitis EXAM: CHEST - 2 VIEW COMPARISON:  09/17/2018 CXR FINDINGS: Top-normal heart size. Moderate aortic atherosclerosis. No pulmonary consolidation to suggest pneumonia. Minimal scarring or atelectasis in the left mid lung. Remote right-sided rib fractures. No acute osseous abnormality. There is mild dextroconvex curvature of the thoracic spine. Pigtail catheter is seen on the lateral view below the diaphragm. IMPRESSION: No active cardiopulmonary disease. Aortic atherosclerosis. Electronically Signed   By: Ashley Royalty M.D.   On: 10/23/2018 19:20    EKG: Independently reviewed. Ordered and pending.   Assessment/Plan:   Active Problems:   ALZHEIMERS DISEASE   COPD (chronic obstructive pulmonary disease) (HCC)   Seizure (HCC)   HTN (hypertension)   Biliary colic  BILIARY COLIC Patient with a known 7 mm stone however does not appear to have acute cholecystitis as she does not appear to be acutely inflamed. No leukocytosis, no fevers or chills, no real change in her LFTs. We have consulted GI for possible ERCP if indicated.  GU SYMPTOMS  Patient with multiple recurrent UTIs narrative neobladder suprapubic catheter. UA has been ordered. However I will start  patient on ceftriaxone empirically.  SEIZURE DISORDER New onset thought to be secondary to  subdural hematoma status post fall a couple of months agowhich is being followed by neurology. Continue Dilantin.  ALZHEIMERS DISEASE Continue donepezil and venlafaxine.  HTN Continue carvedilol and Norvasc and Cardura      Other information:   DVT prophylaxis: Lovenox ordered. Code Status: Full code. Family Communication: son was at bedside throughout  Disposition Plan: home Consults called: GI Admission status: inpatient   The medical decision making is of moderate complexity, therefore this is a level 2 visit.  Dewaine Oats Tublu Zahniya Zellars [facility]  If 7PM-7AM, please contact night-coverage [email or web address] Password [email or web address] 10/23/2018, 8:01 PM

## 2018-10-23 NOTE — Consult Note (Signed)
Reason for Consult:CBD stonesReferring Physician: Hospital team  [name] is an 67 y.o. female.  HPI: patient seen and examined and case discussed with the ER physician at the outlying hospital as well as the hospital team and her case discussed with her son as well and the pain medicines are helping and she's had acute onset of right upper quadrant pain without nausea or vomiting but may be occasional low-grade fevers but no lower bowel complaints and has had endoscopies with dilations in the past but she's been swallowing fine lately and no previous colon workup and her family history is positive for a son with gallstones but no other GI problem runs in the family and she has no other complaints  Past Medical History:  Diagnosis Date  . Alzheimer's dementia ([place])    takes Aricept nightly  . Anemia   . COPD (chronic obstructive pulmonary disease) ([place])   . History of blood transfusion    received 2units on Feb 17,2015  . History of MRSA infection    several yrs ago  . HLD (hyperlipidemia) 06/06/2018  . HTN (hypertension) 06/06/2018  . Hyperlipidemia    takes Atorvastatin daily  . Hypertension    takes Amlodipine and Carvedilol daily  . Insomnia    takes Ambien nightly  . Memory loss   . Renal disorder    kidney stones  . Seizures ([place])   . Subacute delirium 10/14/2013   takes Seroquel and Dilantin nightly  . Toxic encephalopathy   . UTI (lower urinary tract infection)    takes Macrodantin daily    Past Surgical History:  Procedure Laterality Date  . ABDOMINAL ANGIOGRAM N/A 01/20/2014   Procedure: ABDOMINAL ANGIOGRAM;  Surgeon: Elam Dutch, MD;  Location: [facility] CATH LAB;  Service: Cardiovascular;  Laterality: N/A;  . ABDOMINAL HYSTERECTOMY    . AMPUTATION Left 02/08/2014   Procedure: AMPUTATION Left 4th & 5th toes;  Surgeon: Conrad Crete, MD;  Location: [facility];  Service: Vascular;  Laterality: Left;  . BLADDER SURGERY    . CHOLECYSTECTOMY  1995   Gall Bladder  . INSERTION OF  ILIAC STENT Left 01/26/2014   Procedure: INSERTION OF ILIAC STENT- LEFT COMMON ILIAC ARTERY STENT ;  Surgeon: Conrad Hawaii, MD;  Location: [facility];  Service: Vascular;  Laterality: Left;  . KNEE SURGERY      Family History  Problem Relation Age of Onset  . Cancer Brother     Social History:  reports that she has been smoking cigarettes. She has been smoking about 1.00 pack per day. She has never used smokeless tobacco. She reports that she does not drink alcohol or use drugs.  Allergies:  Allergies  Allergen Reactions  . Codeine Nausea And Vomiting    REACTION: intolerance  . Latex Other (See Comments)    itching    Medications: I have reviewed the patient's current medications.  Results for orders placed or performed during the hospital encounter of 10/23/18 (from the past 48 hour(s))  Comprehensive metabolic panel     Status: Abnormal   Collection Time: 10/23/18  3:54 PM  Result Value Ref Range   Sodium 138 135 - 145 mmol/L   Potassium 4.6 3.5 - 5.1 mmol/L   Chloride 115 (H) 98 - 111 mmol/L   CO2 16 (L) 22 - 32 mmol/L   Glucose, Bld 87 70 - 99 mg/dL   BUN 15 8 - 23 mg/dL   Creatinine, Ser 1.42 (H) 0.44 - 1.00 mg/dL  Calcium 8.3 (L) 8.9 - 10.3 mg/dL   Total Protein 6.4 (L) 6.5 - 8.1 g/dL   Albumin 3.3 (L) 3.5 - 5.0 g/dL   AST 16 15 - 41 U/L   ALT 11 0 - 44 U/L   Alkaline Phosphatase 166 (H) 38 - 126 U/L   Total Bilirubin 0.2 (L) 0.3 - 1.2 mg/dL   GFR calc non Af Amer 37 (L) >60 mL/min   GFR calc Af Amer 43 (L) >60 mL/min    Comment: (NOTE) The eGFR has been calculated using the CKD EPI equation. This calculation has not been validated in all clinical situations. eGFR's persistently <60 mL/min signify possible Chronic Kidney Disease.    Anion gap 7 5 - 15    Comment: Performed at [facility] [address], [place], [place] [address]  Magnesium     Status: None   Collection Time: 10/23/18  3:54 PM  Result Value Ref Range   Magnesium 2.1 1.7 - 2.4 mg/dL     Comment: Performed at [facility], [place] [address] [place], [place] [address]  Amylase     Status: None   Collection Time: 10/23/18  3:54 PM  Result Value Ref Range   Amylase 97 28 - 100 U/L    Comment: Performed at [facility] [address], [place], [place] [address]  Lipase, blood     Status: None   Collection Time: 10/23/18  3:54 PM  Result Value Ref Range   Lipase 33 11 - 51 U/L    Comment: Performed at [facility] [address], [place], [place] [address]  CBC WITH DIFFERENTIAL     Status: Abnormal   Collection Time: 10/23/18  3:54 PM  Result Value Ref Range   WBC 8.3 4.0 - 10.5 K/uL   RBC 3.74 (L) 3.87 - 5.11 MIL/uL   Hemoglobin 10.2 (L) 12.0 - 15.0 g/dL   HCT 35.0 (L) 36.0 - 46.0 %   MCV 93.6 80.0 - 100.0 fL   MCH 27.3 26.0 - 34.0 pg   MCHC 29.1 (L) 30.0 - 36.0 g/dL   RDW 14.7 11.5 - 15.5 %   Platelets 157 150 - 400 K/uL   nRBC 0.0 0.0 - 0.2 %   Neutrophils Relative % 59 %   Neutro Abs 4.9 1.7 - 7.7 K/uL   Lymphocytes Relative 30 %   Lymphs Abs 2.5 0.7 - 4.0 K/uL   Monocytes Relative 9 %   Monocytes Absolute 0.8 0.1 - 1.0 K/uL   Eosinophils Relative 1 %   Eosinophils Absolute 0.1 0.0 - 0.5 K/uL   Basophils Relative 1 %   Basophils Absolute 0.1 0.0 - 0.1 K/uL   Immature Granulocytes 0 %   Abs Immature Granulocytes 0.02 0.00 - 0.07 K/uL    Comment: Performed at [facility] [address], [place], [place] [address]  Protime-INR     Status: None   Collection Time: 10/23/18  3:54 PM  Result Value Ref Range   Prothrombin Time 13.9 11.4 - 15.2 seconds   INR 1.08     Comment: Performed at [facility] [address] [place], [place] [address]    No results found.  ROSnegative except above Blood pressure (!) 144/57, pulse 63, temperature 98.7 F (37.1 C), temperature source Oral, resp. rate 17, height 5' 1"  (1.549 m), weight 45.1 kg, SpO2 100 %. Physical Examvital signs stable afebrile no acute distress lungs are clear  heart  regular rate and rhythm abdomen is soft minimal right upper quadrant pain no guarding or rebound rare bowel sound liver tests okay CBC okay ultrasound reported to me as showing CBD stones with a previous CT at outlying hospital showing gallstones  Assessment/Plan: CBD stones on ultrasound Plan: The risks benefits methods and success rate of ERCP was discussed with the patient and her son and will proceed tomorrow at 1 PM with further workup and plans pending those findings  [place] E 10/23/2018, 5:35 PM

## 2018-10-24 ENCOUNTER — Inpatient Hospital Stay (HOSPITAL_COMMUNITY): Payer: Medicare HMO | Admitting: Certified Registered Nurse Anesthetist

## 2018-10-24 ENCOUNTER — Encounter (HOSPITAL_COMMUNITY): Payer: Self-pay

## 2018-10-24 ENCOUNTER — Inpatient Hospital Stay (HOSPITAL_COMMUNITY): Payer: Medicare HMO

## 2018-10-24 ENCOUNTER — Encounter (HOSPITAL_COMMUNITY)
Admission: AD | Disposition: A | Discharge status: Home Health | Payer: Self-pay | Source: Other Acute Inpatient Hospital | Attending: Internal Medicine

## 2018-10-24 ENCOUNTER — Other Ambulatory Visit: Payer: Self-pay

## 2018-10-24 DIAGNOSIS — K8070 Calculus of gallbladder and bile duct without cholecystitis without obstruction: Secondary | ICD-10-CM

## 2018-10-24 HISTORY — PX: ERCP: SHX5425

## 2018-10-24 HISTORY — PX: PANCREATIC STENT PLACEMENT: SHX5539

## 2018-10-24 HISTORY — PX: SPHINCTEROTOMY: SHX5544

## 2018-10-24 HISTORY — PX: REMOVAL OF STONES: SHX5545

## 2018-10-24 LAB — COMPREHENSIVE METABOLIC PANEL
ALBUMIN: 2.9 g/dL — AB (ref 3.5–5.0)
ALK PHOS: 166 U/L — AB (ref 38–126)
ALT: 10 U/L (ref 0–44)
AST: 13 U/L — AB (ref 15–41)
Anion gap: 6 (ref 5–15)
BILIRUBIN TOTAL: 0.4 mg/dL (ref 0.3–1.2)
BUN: 14 mg/dL (ref 8–23)
CALCIUM: 8 mg/dL — AB (ref 8.9–10.3)
CO2: 15 mmol/L — ABNORMAL LOW (ref 22–32)
CREATININE: 1.62 mg/dL — AB (ref 0.44–1.00)
Chloride: 117 mmol/L — ABNORMAL HIGH (ref 98–111)
GFR calc Af Amer: 37 mL/min — ABNORMAL LOW (ref >60)
GFR, EST NON AFRICAN AMERICAN: 32 mL/min — AB (ref >60)
GLUCOSE: 81 mg/dL (ref 70–99)
POTASSIUM: 4.7 mmol/L (ref 3.5–5.1)
Sodium: 138 mmol/L (ref 135–145)
TOTAL PROTEIN: 6 g/dL — AB (ref 6.5–8.1)

## 2018-10-24 LAB — CBC
HCT: 31.9 % — ABNORMAL LOW (ref 36.0–46.0)
Hemoglobin: 9.5 g/dL — ABNORMAL LOW (ref 12.0–15.0)
MCH: 27.6 pg (ref 26.0–34.0)
MCHC: 29.8 g/dL — AB (ref 30.0–36.0)
MCV: 92.7 fL (ref 80.0–100.0)
Platelets: 132 10*3/uL — ABNORMAL LOW (ref 150–400)
RBC: 3.44 MIL/uL — ABNORMAL LOW (ref 3.87–5.11)
RDW: 14.6 % (ref 11.5–15.5)
WBC: 5.3 10*3/uL (ref 4.0–10.5)
nRBC: 0 % (ref 0.0–0.2)

## 2018-10-24 LAB — APTT: aPTT: 43 seconds — ABNORMAL HIGH (ref 24–36)

## 2018-10-24 LAB — PROTIME-INR
INR: 1.16
PROTHROMBIN TIME: 14.7 s (ref 11.4–15.2)

## 2018-10-24 SURGERY — Surgical Case
Anesthesia: *Unknown

## 2018-10-24 SURGERY — ERCP, WITH INTERVENTION IF INDICATED
Anesthesia: General

## 2018-10-24 MED ORDER — IOPAMIDOL (ISOVUE-300) INJECTION 61%
INTRAVENOUS | Status: AC
  Filled 2018-10-24: qty 50

## 2018-10-24 MED ORDER — PROPOFOL 10 MG/ML IV BOLUS
INTRAVENOUS | Status: DC | PRN
  Administered 2018-10-24: 150 mg via INTRAVENOUS
  Administered 2018-10-24: 50 mg via INTRAVENOUS

## 2018-10-24 MED ORDER — ZOLPIDEM TARTRATE 5 MG PO TABS
5.0000 mg | ORAL_TABLET | Freq: Every evening | ORAL | Status: DC | PRN
  Administered 2018-10-24 – 2018-10-30 (×4): 5 mg via ORAL
  Filled 2018-10-24 (×4): qty 1

## 2018-10-24 MED ORDER — UMECLIDINIUM-VILANTEROL 62.5-25 MCG/INH IN AEPB: 1.0000 | INHALATION_SPRAY | Freq: Every day | RESPIRATORY_TRACT | Status: DC | PRN

## 2018-10-24 MED ORDER — DOXAZOSIN MESYLATE 2 MG PO TABS
2.0000 mg | ORAL_TABLET | Freq: Every day | ORAL | Status: DC
  Filled 2018-10-24: qty 1

## 2018-10-24 MED ORDER — LIDOCAINE 2% (20 MG/ML) 5 ML SYRINGE
INTRAMUSCULAR | Status: DC | PRN
  Administered 2018-10-24: 40 mg via INTRAVENOUS

## 2018-10-24 MED ORDER — ONDANSETRON HCL 4 MG/2ML IJ SOLN
INTRAMUSCULAR | Status: DC | PRN
  Administered 2018-10-24: 4 mg via INTRAVENOUS

## 2018-10-24 MED ORDER — SUGAMMADEX SODIUM 200 MG/2ML IV SOLN
INTRAVENOUS | Status: DC | PRN
  Administered 2018-10-24 (×2): 50 mg via INTRAVENOUS

## 2018-10-24 MED ORDER — PHENYLEPHRINE 40 MCG/ML (10ML) SYRINGE FOR IV PUSH (FOR BLOOD PRESSURE SUPPORT)
PREFILLED_SYRINGE | INTRAVENOUS | Status: DC | PRN
  Administered 2018-10-24: 40 ug via INTRAVENOUS
  Administered 2018-10-24: 80 ug via INTRAVENOUS

## 2018-10-24 MED ORDER — LACTATED RINGERS IV SOLN
INTRAVENOUS | Status: DC | PRN
  Administered 2018-10-24 (×2): via INTRAVENOUS

## 2018-10-24 MED ORDER — PNEUMOCOCCAL VAC POLYVALENT 25 MCG/0.5ML IJ INJ
0.5000 mL | INJECTION | INTRAMUSCULAR | Status: AC
  Administered 2018-10-30: 0.5 mL via INTRAMUSCULAR
  Filled 2018-10-24: qty 0.5

## 2018-10-24 MED ORDER — FENTANYL CITRATE (PF) 100 MCG/2ML IJ SOLN
INTRAMUSCULAR | Status: DC | PRN
  Administered 2018-10-24 (×2): 50 ug via INTRAVENOUS

## 2018-10-24 MED ORDER — INDOMETHACIN 50 MG RE SUPP
RECTAL | Status: DC | PRN
  Administered 2018-10-24: 40 mg via RECTAL

## 2018-10-24 MED ORDER — SODIUM CHLORIDE 0.9 % IV SOLN
INTRAVENOUS | Status: DC | PRN
  Administered 2018-10-24: 40 mL

## 2018-10-24 MED ORDER — GLUCAGON HCL RDNA (DIAGNOSTIC) 1 MG IJ SOLR
INTRAMUSCULAR | Status: AC
  Filled 2018-10-24: qty 1

## 2018-10-24 MED ORDER — ROCURONIUM BROMIDE 10 MG/ML (PF) SYRINGE
PREFILLED_SYRINGE | INTRAVENOUS | Status: DC | PRN
  Administered 2018-10-24: 30 mg via INTRAVENOUS

## 2018-10-24 MED ORDER — INDOMETHACIN 50 MG RE SUPP
RECTAL | Status: AC
  Filled 2018-10-24: qty 2

## 2018-10-24 MED ORDER — OXYCODONE HCL 5 MG PO TABS
5.0000 mg | ORAL_TABLET | ORAL | Status: DC | PRN
  Administered 2018-10-24: 5 mg via ORAL
  Administered 2018-10-24 – 2018-10-31 (×23): 10 mg via ORAL
  Filled 2018-10-24 (×26): qty 2

## 2018-10-24 MED ORDER — DEXAMETHASONE SODIUM PHOSPHATE 10 MG/ML IJ SOLN
INTRAMUSCULAR | Status: DC | PRN
  Administered 2018-10-24: 10 mg via INTRAVENOUS

## 2018-10-24 NOTE — Transfer of Care (Signed)
Immediate Anesthesia Transfer of Care Note  Patient: [name]  Procedure(s) Performed: ENDOSCOPIC RETROGRADE CHOLANGIOPANCREATOGRAPHY (ERCP) (N/A ) PANCREATIC STENT PLACEMENT SPHINCTEROTOMY REMOVAL OF STONES  Patient Location: PACU  Anesthesia Type:General  Level of Consciousness: awake, oriented and patient cooperative  Airway & Oxygen Therapy: Patient Spontanous Breathing and Patient connected to nasal cannula oxygen  Post-op Assessment: Report given to RN and Post -op Vital signs reviewed and stable  Post vital signs: Reviewed  Last Vitals:  Vitals Value Taken Time  BP    Temp    Pulse    Resp    SpO2      Last Pain:  Vitals:   10/24/18 1239  TempSrc: Oral  PainSc: 0-No pain         Complications: No apparent anesthesia complications

## 2018-10-24 NOTE — Op Note (Signed)
[facility] Patient Name: [name] Procedure Date : 10/24/2018 MRN: [id number] Attending MD: Clarene Essex , MD Date of Birth: Jul 18, 1951 CSN: [id number] Age: 67 Admit Type: Inpatient Procedure:                ERCP Indications:              For therapy of bile duct stone(s)seen on ultrasound Providers:                Clarene Essex, MD, Cleda Daub, RN, Elspeth Cho                            Tech., Technician, Judeth Cornfield, CRNA Referring MD:              Medicines:                General Anesthesia Complications:            No immediate complications. Estimated Blood Loss:     Estimated blood loss: none. Procedure:                Pre-Anesthesia Assessment:                           - Prior to the procedure, a History and Physical                            was performed, and patient medications and                            allergies were reviewed. The patient's tolerance of                            previous anesthesia was also reviewed. The risks                            and benefits of the procedure and the sedation                            options and risks were discussed with the patient.                            All questions were answered, and informed consent                            was obtained. Prior Anticoagulants: The patient has                            taken no previous anticoagulant or antiplatelet                            agents. ASA Grade Assessment: II - A patient with                            mild systemic disease. After reviewing the risks  and benefits, the patient was deemed in                            satisfactory condition to undergo the procedure.                           After obtaining informed consent, the scope was                            passed under direct vision. Throughout the                            procedure, the patient's blood pressure, pulse, and   oxygen saturations were monitored continuously. The                            [id number]-[id number] ([id number]) Olympus ERCP was introduced                            through the mouth, and used to inject contrast into                            and used to cannulate the bile duct. The ERCP was                            somewhat difficult. Successful completion of the                            procedure was aided by performing the maneuvers                            documented (below) in this report. The patient                            tolerated the procedure well. Scope In: Scope Out: Findings:      The major papilla was normal.initially the wire was advanced one time       towards the pancreas and the sphincterotome was repositioned and deep       selective cannulation was obtained but unfortunately while we were       advancing the wire into the intrahepatic the catheterand wire fell out       of the CBD and in trying to recannulate we again passed a wire into the       pancreatic duct so we elected to place One 5 Fr by 3 cm plastic stent       with a 3/4 external pigtail and no internal flaps was placed 2 cm into       the ventral pancreatic duct. The stent was in good position. we then       cannulated next to the stent and were able to once again get deep       selective cannulation and proceed with a Biliary sphincterotomy was made       with a Hydratome sphincterotome using ERBE electrocautery. There was no       post-sphincterotomy bleeding. we proceeded until we had adequate  biliary       drainage and could get the fully bowed sphincterotome easily in and out       of the duct and then we swept the biliary tree with a 9-12 mm adjustable       balloon starting at the bifurcation.first the 9 mm balloon passed       readily through the patent sphincterotomy site and then we re-swept the       duct using the 12 mm balloon and One stone was removed. multiple 12 mm       balloon  pull-through's were done after that and No stones remained. the       12 mm balloon also passed readily through the patent sphincterotomy site       and we proceeded with a occlusion cholangiogram in the customary fashion       which was negative and there was adequate biliary drainage and as an       aside she did have cystic duct patency and the gallbladder was filled       with some contrast and the wire and the balloon and the scope were       removed and the patient tolerated the procedure well Impression:               - The major papilla appeared normal.                           - Choledocholithiasis was found. Complete removal                            was accomplished by biliary sphincterotomy and                            balloon extraction.                           - One plastic stent was placed into the ventral                            pancreatic duct to decrease the chance of                            pancreatitis and assist with cannulation.                           - A biliary sphincterotomy was performed.                           - The biliary tree was swept. Moderate Sedation:      moderate sedation-none Recommendation:           - Clear liquid diet today.if doing well may have                            soft solids tomorrow                           - Continue present medications.                           -  Return to GI clinic PRN.                           - Telephone GI clinic if symptomatic PRN.                           - Perform a flat plate and upright abdominal x-ray                            in 1 week. to confirm pancreatic stent passing                           - Refer to a surgeon at appointment to be                            scheduled. possibly this can wait since she may not                            have any residual stones in the gallbladder or can                            see a surgeon in her home town who had seen her in                             the emergency room or at least talk to the ER                            physician Procedure Code(s):        --- Professional ---                           [phone], Endoscopic retrograde                            cholangiopancreatography (ERCP); with placement of                            endoscopic stent into biliary or pancreatic duct,                            including pre- and post-dilation and guide wire                            passage, when performed, including sphincterotomy,                            when performed, each stent                           43264, Endoscopic retrograde                            cholangiopancreatography (ERCP); with removal of  calculi/debris from biliary/pancreatic duct(s)                           43262, 59, Endoscopic retrograde                            cholangiopancreatography (ERCP); with                            sphincterotomy/papillotomy Diagnosis Code(s):        --- Professional ---                           K80.50, Calculus of bile duct without cholangitis                            or cholecystitis without obstruction CPT copyright 2018 [employer] Association. All rights reserved. The codes documented in this report are preliminary and upon coder review may  be revised to meet current compliance requirements. Clarene Essex, MD 10/24/2018 3:27:37 PM This report has been signed electronically. Number of Addenda: 0

## 2018-10-24 NOTE — Anesthesia Procedure Notes (Deleted)
Procedure Name: Intubation Date/Time: 10/24/2018 2:02 PM Performed by: Jenne Campus, CRNA Pre-anesthesia Checklist: Patient identified, Emergency Drugs available, Suction available and Patient being monitored Patient Re-evaluated:Patient Re-evaluated prior to induction Oxygen Delivery Method: Circle System Utilized Preoxygenation: Pre-oxygenation with 100% oxygen Induction Type: IV induction Ventilation: Mask ventilation without difficulty Laryngoscope Size: McGraph and 3 Grade View: Grade I Tube type: Oral Tube size: 7.0 mm Number of attempts: 1 Airway Equipment and Method: Stylet and Oral airway Placement Confirmation: ETT inserted through vocal cords under direct vision,  positive ETCO2 and breath sounds checked- equal and bilateral Secured at: 21 cm Tube secured with: Tape Dental Injury: Teeth and Oropharynx as per pre-operative assessment

## 2018-10-24 NOTE — Anesthesia Preprocedure Evaluation (Addendum)
Anesthesia Evaluation  Patient identified by MRN, date of birth, ID band Patient awake    Reviewed: Allergy & Precautions, NPO status , Patient's Chart, lab work & pertinent test results  Airway Mallampati: II  TM Distance: >3 FB Neck ROM: Full    Dental  (+) Missing, Edentulous Lower,    Pulmonary COPD,  COPD inhaler, Current Smoker,    Pulmonary exam normal breath sounds clear to auscultation       Cardiovascular hypertension, Pt. on medications and Pt. on home beta blockers + Peripheral Vascular Disease  Normal cardiovascular exam Rhythm:Regular Rate:Normal  ECG: NSR, rate 73  ECHO: LV EF: 60% - 65%   Neuro/Psych  Headaches, Seizures -, Well Controlled,  PSYCHIATRIC DISORDERS Dementia    GI/Hepatic Neg liver ROS, GERD  Controlled,  Endo/Other  negative endocrine ROS  Renal/GU CRFRenal disease     Musculoskeletal negative musculoskeletal ROS (+)   Abdominal   Peds  Hematology  (+) anemia , HLD   Anesthesia Other Findings Cholecystitis  Reproductive/Obstetrics                            Anesthesia Physical Anesthesia Plan  ASA: III  Anesthesia Plan: General   Post-op Pain Management:    Induction: Intravenous  PONV Risk Score and Plan: 3 and Ondansetron, Dexamethasone and Treatment may vary due to age or medical condition  Airway Management Planned: Oral ETT  Additional Equipment:   Intra-op Plan:   Post-operative Plan: Extubation in OR  Informed Consent: I have reviewed the patients History and Physical, chart, labs and discussed the procedure including the risks, benefits and alternatives for the proposed anesthesia with the patient or authorized representative who has indicated his/her understanding and acceptance.   Dental advisory given  Plan Discussed with: CRNA  Anesthesia Plan Comments:        Anesthesia Quick Evaluation

## 2018-10-24 NOTE — Progress Notes (Signed)
[name] 1:14 PM  Subjective: Patient without any new complaints still with a little bit of pain  Objective: Vital signs stable afebrile exam please see preassessment evaluationlabs all stablerepeat ultrasound did not confirm stone  Assessment: Probable CBD stone  Plan: Okay to proceed with ERCP with anesthesia assistance  [facility]  Pager [phone] After 5PM or if no answer call [phone]

## 2018-10-24 NOTE — Plan of Care (Signed)
  Problem: Education: Goal: Knowledge of General Education information will improve Description Including pain rating scale, medication(s)/side effects and non-pharmacologic comfort measures Outcome: Progressing   

## 2018-10-24 NOTE — Progress Notes (Signed)
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   Assessment & Plan:   Active Problems:   ALZHEIMERS DISEASE   COPD (chronic obstructive pulmonary disease) (HCC)   Seizure (HCC)   HTN (hypertension)   Biliary colic   #) choledocholithiasis: Noted on ultrasound to have 7 mm common bile duct stone.  She continues to have biliary colic pain. 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 - Patient taken off clopidogrel for peripheral vascular disease  #) Hypertension/hyperlipidemia: -Continue carvedilol 6.25 mg twice daily -Continue atorvastatin 10 mg daily -Continue amlodipine 2.5 mg q. at bedtime  #) Seizure disorder: -Continue phenytoin 20 mg nightly  #) Peripheral vascular disease status post stent, gated by cholesterol emboli: This appears to be stable -Patient was taken off clopidogrel due  to concern for possible subdural hematoma -Continue statin  #) Dementia/pain/psych: -Continue venlafaxine 150 mg every morning -Continue donepezil 10 mg nightly  #) Stage III CKD: -Stable  #) Hypoproliferative anemia: Likely secondary to anemia chronic disease as well as CKD.  Fluids: Gentle IV fluids Electrodes: Monitor and supplement Nutrition: N.p.o. currently, will start heart healthy diet after ERCP  Prophylaxis: Subcu heparin  Disposition: Pending ERCP and cholecystectomy  Full code  Consultants:   Gastroenterology Dr. Watt Climes  General surgery consult  Procedures:   10/24/2018 ERCP: Pending  Antimicrobials:   IV ceftriaxone started 10/23/2018   Subjective: This morning patient reports she continues to have pain.  She has not had anything to eat so she has not had any emesis but does endorse nausea.  She denies any cough, congestion, chest pain.  She has not had any diarrhea.  Objective: Vitals:   10/23/18 2214 10/24/18 0528 10/24/18 0855 10/24/18 1239  BP: (!) 164/61 (!) 152/55 (!) 168/57 123/76  Pulse: 70 80 75 63  Resp: 18 17  17   Temp: 98.9 F (37.2 C) 99.1 F (37.3 C)  98.6 F (37 C)  TempSrc: Oral Oral  Oral  SpO2: 100% 97%  100%  Weight:      Height:        Intake/Output Summary (Last 24 hours) at 10/24/2018 1255 Last data filed at 10/24/2018 0532 Gross per 24 hour  Intake 1244.4 ml  Output 750 ml  Net 494.4 ml   Filed Weights   10/23/18 1343  Weight: 45.1 kg    Examination:  General exam: Mildly uncomfortable Respiratory system: Clear to auscultation. Respiratory effort normal. Cardiovascular system: Regular rate and rhythm, no murmurs Gastrointestinal system: Abdomen is soft, tender to deep and light palpation prickly in right epigastric area, hypoactive bowel sounds, no rebound or  guarding,, neobladder site is clean dry and intact Central nervous system: Alert and oriented. No focal neurological deficits. Extremities: Trace lower  extremity edema Skin: No rashes over visible skin Psychiatry: Judgement and insight appear normal. Mood & affect appropriate.     Data Reviewed: I have personally reviewed following labs and imaging studies  CBC: Recent Labs  Lab 10/23/18 1554 10/24/18 0325  WBC 8.3 5.3  NEUTROABS 4.9  --   HGB 10.2* 9.5*  HCT 35.0* 31.9*  MCV 93.6 92.7  PLT 157 161*   Basic Metabolic Panel: Recent Labs  Lab 10/23/18 1554 10/24/18 0325  NA 138 138  K 4.6 4.7  CL 115* 117*  CO2 16* 15*  GLUCOSE 87 81  BUN 15 14  CREATININE 1.42* 1.62*  CALCIUM 8.3* 8.0*  MG 2.1  --    GFR: Estimated Creatinine Clearance: 24 mL/min (A) (by C-G formula based on SCr of 1.62 mg/dL (H)). Liver Function Tests: Recent Labs  Lab 10/23/18 1554 10/24/18 0325  AST 16 13*  ALT 11 10  ALKPHOS 166* 166*  BILITOT 0.2* 0.4  PROT 6.4* 6.0*  ALBUMIN 3.3* 2.9*   Recent Labs  Lab 10/23/18 1554  LIPASE 33  AMYLASE 97   No results for input(s): AMMONIA in the last 168 hours. Coagulation Profile: Recent Labs  Lab 10/23/18 1554 10/24/18 0325  INR 1.08 1.16   Cardiac Enzymes: No results for input(s): CKTOTAL, CKMB, CKMBINDEX, TROPONINI in the last 168 hours. BNP (last 3 results) No results for input(s): PROBNP in the last 8760 hours. HbA1C: No results for input(s): HGBA1C in the last 72 hours. CBG: No results for input(s): GLUCAP in the last 168 hours. Lipid Profile: No results for input(s): CHOL, HDL, LDLCALC, TRIG, CHOLHDL, LDLDIRECT in the last 72 hours. Thyroid Function Tests: No results for input(s): TSH, T4TOTAL, FREET4, T3FREE, THYROIDAB in the last 72 hours. Anemia Panel: No results for input(s): VITAMINB12, FOLATE, FERRITIN, TIBC, IRON, RETICCTPCT in the last 72 hours. Sepsis Labs: No results for input(s): PROCALCITON, LATICACIDVEN in the last 168 hours.  No results found for this or any previous visit (from the past 240 hour(s)).       Radiology Studies: X-ray Chest Pa And  Lateral  Result Date: 10/23/2018 CLINICAL DATA:  Cholecystitis EXAM: CHEST - 2 VIEW COMPARISON:  09/17/2018 CXR FINDINGS: Top-normal heart size. Moderate aortic atherosclerosis. No pulmonary consolidation to suggest pneumonia. Minimal scarring or atelectasis in the left mid lung. Remote right-sided rib fractures. No acute osseous abnormality. There is mild dextroconvex curvature of the thoracic spine. Pigtail catheter is seen on the lateral view below the diaphragm. IMPRESSION: No active cardiopulmonary disease. Aortic atherosclerosis. Electronically Signed   By: Ashley Royalty M.D.   On: 10/23/2018 19:20   US Abdomen Complete  Result Date: 10/23/2018 CLINICAL DATA:  Acute onset of right upper quadrant abdominal pain. Assess for cholecystitis. EXAM: ABDOMEN ULTRASOUND COMPLETE COMPARISON:  Right upper quadrant ultrasound performed earlier today at 8:16 a.m., and CT of the abdomen and pelvis performed 08/17/2018 FINDINGS: Gallbladder: No gallstones or wall thickening visualized. No sonographic Murphy sign noted by sonographer. Common bile duct: Diameter: 0.5 cm, within normal limits in caliber. A 7 mm stone was noted in the mid common bile duct on the prior ultrasound. Liver: No focal lesion identified. There is prominence of the intrahepatic biliary ducts, which raises question for distal obstruction. Within normal limits in parenchymal echogenicity. Portal vein is patent on color Doppler imaging with normal direction of blood flow towards  the liver. IVC: No abnormality visualized. Pancreas: Visualized portion unremarkable. Spleen: Size and appearance within normal limits. The vasculature within the spleen is mildly hyperechoic, thought to reflect normal barium. Right Kidney: Length: 10.6 cm. Increased parenchymal echogenicity is noted. Mild right-sided hydronephrosis is seen. A 1.4 cm cyst is noted at the interpole region of the right kidney, with a thin septation. Left Kidney: Length: 10.2 cm. Increased  parenchymal echogenicity is noted. Mild left-sided hydronephrosis is seen. No mass visualized. Abdominal aorta: No aneurysm visualized. Other findings: None. IMPRESSION: 1. Prominence of the intrahepatic biliary ducts raises question for distal obstruction. Would correlate with LFTs. The common bile duct remains normal in caliber, though a 7 mm stone was noted in the mid common bile duct on the prior ultrasound. 2. Mildly increased renal parenchymal echogenicity raises concern for medical renal disease. 3. Mild bilateral hydronephrosis, of uncertain significance. 4. 1.4 cm right renal cyst incidentally noted, with a thin septation. Electronically Signed   By: Garald Balding M.D.   On: 10/23/2018 23:42        Scheduled Meds: . [MAR Hold] amLODipine  2.5 mg Oral QPM  . [MAR Hold] atorvastatin  10 mg Oral Daily  . [MAR Hold] carvedilol  6.25 mg Oral BID WC  . [MAR Hold] donepezil  10 mg Oral QHS  . [MAR Hold] doxazosin  2 mg Oral Daily  . [MAR Hold] phenytoin  200 mg Oral QHS  . [MAR Hold] pneumococcal 23 valent vaccine  0.5 mL Intramuscular Tomorrow-1000  . [MAR Hold] venlafaxine XR  150 mg Oral Q breakfast   Continuous Infusions: . sodium chloride 100 mL/hr at 10/24/18 0537  . [MAR Hold] cefTRIAXone (ROCEPHIN)  IV Stopped (10/23/18 2327)     LOS: 1 day    Time spent: [place], MD [facility]  If 7PM-7AM, please contact night-coverage [email or web address] Password [email or web address] 10/24/2018, 12:55 PM

## 2018-10-25 LAB — COMPREHENSIVE METABOLIC PANEL
ALK PHOS: 153 U/L — AB (ref 38–126)
ALT: 11 U/L (ref 0–44)
ANION GAP: 8 (ref 5–15)
AST: 14 U/L — ABNORMAL LOW (ref 15–41)
Albumin: 2.9 g/dL — ABNORMAL LOW (ref 3.5–5.0)
BUN: 22 mg/dL (ref 8–23)
CALCIUM: 7.8 mg/dL — AB (ref 8.9–10.3)
CO2: 12 mmol/L — AB (ref 22–32)
Chloride: 116 mmol/L — ABNORMAL HIGH (ref 98–111)
Creatinine, Ser: 1.57 mg/dL — ABNORMAL HIGH (ref 0.44–1.00)
GFR, EST AFRICAN AMERICAN: 38 mL/min — AB (ref >60)
GFR, EST NON AFRICAN AMERICAN: 33 mL/min — AB (ref >60)
Glucose, Bld: 71 mg/dL (ref 70–99)
Potassium: 4.7 mmol/L (ref 3.5–5.1)
Sodium: 136 mmol/L (ref 135–145)
Total Bilirubin: 0.3 mg/dL (ref 0.3–1.2)
Total Protein: 5.9 g/dL — ABNORMAL LOW (ref 6.5–8.1)

## 2018-10-25 LAB — URINE CULTURE

## 2018-10-25 LAB — CBC
HCT: 30.4 % — ABNORMAL LOW (ref 36.0–46.0)
HEMOGLOBIN: 9.1 g/dL — AB (ref 12.0–15.0)
MCH: 27.7 pg (ref 26.0–34.0)
MCHC: 29.9 g/dL — AB (ref 30.0–36.0)
MCV: 92.7 fL (ref 80.0–100.0)
PLATELETS: 112 10*3/uL — AB (ref 150–400)
RBC: 3.28 MIL/uL — ABNORMAL LOW (ref 3.87–5.11)
RDW: 14.4 % (ref 11.5–15.5)
WBC: 8.8 10*3/uL (ref 4.0–10.5)
nRBC: 0 % (ref 0.0–0.2)

## 2018-10-25 LAB — MAGNESIUM: MAGNESIUM: 1.9 mg/dL (ref 1.7–2.4)

## 2018-10-25 MED ORDER — METHOCARBAMOL 500 MG PO TABS
500.0000 mg | ORAL_TABLET | Freq: Four times a day (QID) | ORAL | Status: AC | PRN
  Administered 2018-10-26 (×2): 500 mg via ORAL
  Filled 2018-10-25 (×2): qty 1

## 2018-10-25 MED ORDER — HYDRALAZINE HCL 20 MG/ML IJ SOLN
5.0000 mg | Freq: Four times a day (QID) | INTRAMUSCULAR | Status: AC | PRN
  Administered 2018-10-26 – 2018-10-30 (×2): 5 mg via INTRAVENOUS
  Filled 2018-10-25 (×2): qty 1

## 2018-10-25 MED ORDER — SODIUM BICARBONATE 650 MG PO TABS
650.0000 mg | ORAL_TABLET | Freq: Three times a day (TID) | ORAL | Status: DC
  Administered 2018-10-25 – 2018-10-31 (×20): 650 mg via ORAL
  Filled 2018-10-25 (×20): qty 1

## 2018-10-25 NOTE — Progress Notes (Signed)
[facility] Progress Note  [name] 67 y.o. September 21, 1951   Subjective: Continues to have abdominal pain that is unchanged from prior to ERCP.  Objective: Vital signs: Vitals:   10/25/18 0609 10/25/18 1045  BP: (!) 150/72 (!) 159/57  Pulse: 69 65  Resp: 17 18  Temp: 97.8 F (36.6 C) 98.3 F (36.8 C)  SpO2: 99% 99%    Physical Exam: Gen: alert, no acute distress  HEENT: anicteric sclera CV: RRR Chest: CTA B Abd: diffuse tenderness with guarding, soft, nondistended, +BS Ext: no edema  Lab Results: Recent Labs    10/23/18 1554 10/24/18 0325 10/25/18 0529  NA 138 138 136  K 4.6 4.7 4.7  CL 115* 117* 116*  CO2 16* 15* 12*  GLUCOSE 87 81 71  BUN 15 14 22   CREATININE 1.42* 1.62* 1.57*  CALCIUM 8.3* 8.0* 7.8*  MG 2.1  --  1.9   Recent Labs    10/24/18 0325 10/25/18 0529  AST 13* 14*  ALT 10 11  ALKPHOS 166* 153*  BILITOT 0.4 0.3  PROT 6.0* 5.9*  ALBUMIN 2.9* 2.9*   Recent Labs    10/23/18 1554 10/24/18 0325 10/25/18 0529  WBC 8.3 5.3 8.8  NEUTROABS 4.9  --   --   HGB 10.2* 9.5* 9.1*  HCT 35.0* 31.9* 30.4*  MCV 93.6 92.7 92.7  PLT 157 132* 112*      Assessment/Plan: Choledocholithiasis - s/p removal and sphincterotomy on ERCP yesterday with normal TB, AST/ALT. ALP 153. Complains of unchanged abdominal pain. Hemodynamically stable. Doubt post-ERCP pancreatitis but if abd pain persists will check a Lipase tomorrow morning. Continue supportive care. Will follow.   [name] 10/25/2018, 11:42 AM  Questions please call [phone] ID: [name], female   DOB: [birth year], 67 y.o.   MRN: [id number]

## 2018-10-25 NOTE — Progress Notes (Signed)
Notified MD on call for pt's current blood pressure of 187/75. Will monitor pt.

## 2018-10-25 NOTE — Progress Notes (Signed)
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, stage III CKD, COPD, hypertension, hyperlipidemia, peripheral vascular disease status post stenting of left common iliac artery complicated by embolic phenomena status post fourth and fifth toe amputation, seizure disorder, artificial bladder with recurrent UTIs and self-catheterization, recent subdural hematoma who presented to [facility] with right upper quadrant pain was found to have choledocholithiasis status post ERCP on 10/24/2018   Assessment & Plan:   Active Problems:   ALZHEIMERS DISEASE   COPD (chronic obstructive pulmonary disease) ([place])   Seizure ([place])   HTN (hypertension)   Biliary colic   #) choledocholithiasis status post ERCP on 10/24/2018: Doing well with improved pain - GI following, appreciate recommendations Patient will need upright and flatplate in 1 week approximately 10/31/2018 to evaluate for pancreatic stent placed -Per gastroenterology patient can follow-up as an outpatient for cholecystectomy  #) Neobladder, 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  She is had episodes of small bowel bacterial overgrowth.  She has been persistently acidotic for several hospitalizations. - Start sodium bicarbonate 650 mg 3 times daily  #) Recent subdural: Patient apparently has had recent subdural with fall. - Patient taken off clopidogrel for peripheral vascular disease  #) Hypertension/hyperlipidemia: -Continue carvedilol 6.25 mg twice daily -Continue  atorvastatin 10 mg daily -Continue amlodipine 2.5 mg q. at bedtime  #) Seizure disorder: -Continue phenytoin 20 mg nightly  #) Peripheral vascular disease status post stent, gated by cholesterol emboli: This appears to be stable -Patient was taken off clopidogrel due to concern for possible subdural hematoma -Continue statin  #) Dementia/pain/psych: -Continue venlafaxine 150 mg every morning -Continue donepezil 10 mg nightly  #) Stage III CKD: -Stable  #) Hypoproliferative anemia: Likely secondary to anemia chronic disease as well as CKD.  Fluids: Tolerating p.o. Electrodes: Monitor and supplement Nutrition: Soft bland diet  Prophylaxis: Subcu heparin  Disposition: Pending ERCP and cholecystectomy  Full code  Consultants:   Gastroenterology Dr. Watt Climes  General surgery consult  Procedures:  10/24/2018 ERCP: The major papilla appeared normal. - Choledocholithiasis was found. Complete removal was accomplished by biliary sphincterotomy and balloon extraction. - One plastic stent was placed into the ventral pancreatic duct to decrease the chance of pancreatitis and assist with cannulation. - A biliary sphincterotomy was performed. - The biliary tree was swept. Impression: moderate sedation-none Moderate Sedation: - Clear liquid diet today.if doing well may have soft solids tomorrow - Continue present medications. - Return to GI clinic PRN. - Telephone GI clinic if symptomatic PRN. - Perform a flat plate and upright abdominal x-ray in 1 week. to confirm pancreatic stent passing - Refer to a surgeon at appointment to be scheduled. possibly this can wait since she may not have any residual stones in the gallbladder or can see a surgeon in her [place] who had seen her in the emergency room or at least talk to the ER physician  Antimicrobials:   IV ceftriaxone started 10/23/2018   Subjective: This morning patient reports that her pain is improved from before but  she still has some right upper quadrant  pain.  She is tolerated liquid diet well.  She is passing stools and having flatus.  She denies any nausea, vomiting, diarrhea, cough, congestion, rhinorrhea.  Objective: Vitals:   10/24/18 2121 10/25/18 0249 10/25/18 0609 10/25/18 1045  BP: (!) 153/69 (!) 152/49 (!) 150/72 (!) 159/57  Pulse: (!) 59 70 69 65  Resp: 17 17 17 18   Temp: 97.7 F (36.5 C) 98.5 F (36.9 C) 97.8 F (36.6 C) 98.3 F (36.8 C)  TempSrc: Oral Oral Oral Oral  SpO2: 98% 97% 99% 99%  Weight:      Height:        Intake/Output Summary (Last 24 hours) at 10/25/2018 1107 Last data filed at 10/25/2018 0400 Gross per 24 hour  Intake 3446.76 ml  Output 1000 ml  Net 2446.76 ml   Filed Weights   10/23/18 1343  Weight: 45.1 kg    Examination:  General exam: No acute distress Respiratory system: Clear to auscultation. Respiratory effort normal. Cardiovascular system: Regular rate and rhythm, no murmurs Gastrointestinal system: Abdomen is soft, tender to deep palpation over right epigastric area, hypoactive bowel sounds, no rebound or guarding,, neobladder site is clean dry and intact Central nervous system: Alert and oriented. No focal neurological deficits. Extremities: Trace lower extremity edema Skin: Healed incisions over abdomen Psychiatry: Judgement and insight appear normal. Mood & affect appropriate.     Data Reviewed: I have personally reviewed following labs and imaging studies  CBC: Recent Labs  Lab 10/23/18 1554 10/24/18 0325 10/25/18 0529  WBC 8.3 5.3 8.8  NEUTROABS 4.9  --   --   HGB 10.2* 9.5* 9.1*  HCT 35.0* 31.9* 30.4*  MCV 93.6 92.7 92.7  PLT 157 132* 161*   Basic Metabolic Panel: Recent Labs  Lab 10/23/18 1554 10/24/18 0325 10/25/18 0529  NA 138 138 136  K 4.6 4.7 4.7  CL 115* 117* 116*  CO2 16* 15* 12*  GLUCOSE 87 81 71  BUN 15 14 22   CREATININE 1.42* 1.62* 1.57*  CALCIUM 8.3* 8.0* 7.8*  MG 2.1  --  1.9   GFR: Estimated  Creatinine Clearance: 24.8 mL/min (A) (by C-G formula based on SCr of 1.57 mg/dL (H)). Liver Function Tests: Recent Labs  Lab 10/23/18 1554 10/24/18 0325 10/25/18 0529  AST 16 13* 14*  ALT 11 10 11   ALKPHOS 166* 166* 153*  BILITOT 0.2* 0.4 0.3  PROT 6.4* 6.0* 5.9*  ALBUMIN 3.3* 2.9* 2.9*   Recent Labs  Lab 10/23/18 1554  LIPASE 33  AMYLASE 97   No results for input(s): AMMONIA in the last 168 hours. Coagulation Profile: Recent Labs  Lab 10/23/18 1554 10/24/18 0325  INR 1.08 1.16   Cardiac Enzymes: No results for input(s): CKTOTAL, CKMB, CKMBINDEX, TROPONINI in the last 168 hours. BNP (last 3 results) No results for input(s): PROBNP in the last 8760 hours. HbA1C: No results for input(s): HGBA1C in the last 72 hours. CBG: No results for input(s): GLUCAP in the last 168 hours. Lipid Profile: No results for input(s): CHOL, HDL, LDLCALC, TRIG, CHOLHDL, LDLDIRECT in the last 72 hours. Thyroid Function Tests: No results for input(s): TSH, T4TOTAL, FREET4, T3FREE, THYROIDAB in the last 72 hours. Anemia Panel: No results for input(s): VITAMINB12, FOLATE, FERRITIN, TIBC, IRON, RETICCTPCT in the last 72 hours. Sepsis Labs: No results for input(s): PROCALCITON, LATICACIDVEN in the last 168 hours.  Recent Results (from the past 240 hour(s))  Urine culture     Status: None   Collection Time: 10/23/18  8:14 PM  Result Value Ref Range Status   Specimen Description URINE, SUPRAPUBIC  Final   Special Requests   Final    NONE Performed at [facility], [address] [place], [place] [address]    Culture   Final    Multiple bacterial morphotypes present, none predominant. Suggest appropriate recollection if clinically indicated.   Report Status 10/25/2018 FINAL  Final         Radiology Studies: X-ray Chest Pa And Lateral  Result Date: 10/23/2018 CLINICAL DATA:  Cholecystitis EXAM: CHEST - 2 VIEW COMPARISON:  09/17/2018 CXR FINDINGS: Top-normal heart size.  Moderate aortic atherosclerosis. No pulmonary consolidation to suggest pneumonia. Minimal scarring or atelectasis in the left mid lung. Remote right-sided rib fractures. No acute osseous abnormality. There is mild dextroconvex curvature of the thoracic spine. Pigtail catheter is seen on the lateral view below the diaphragm. IMPRESSION: No active cardiopulmonary disease. Aortic atherosclerosis. Electronically Signed   By: Ashley Royalty M.D.   On: 10/23/2018 19:20   US Abdomen Complete  Result Date: 10/23/2018 CLINICAL DATA:  Acute onset of right upper quadrant abdominal pain. Assess for cholecystitis. EXAM: ABDOMEN ULTRASOUND COMPLETE COMPARISON:  Right upper quadrant ultrasound performed earlier today at 8:16 a.m., and CT of the abdomen and pelvis performed 08/17/2018 FINDINGS: Gallbladder: No gallstones or wall thickening visualized. No sonographic Murphy sign noted by sonographer. Common bile duct: Diameter: 0.5 cm, within normal limits in caliber. A 7 mm stone was noted in the mid common bile duct on the prior ultrasound. Liver: No focal lesion identified. There is prominence of the intrahepatic biliary ducts, which raises question for distal obstruction. Within normal limits in parenchymal echogenicity. Portal vein is patent on color Doppler imaging with normal direction of blood flow towards the liver. IVC: No abnormality visualized. Pancreas: Visualized portion unremarkable. Spleen: Size and appearance within normal limits. The vasculature within the spleen is mildly hyperechoic, thought to reflect normal barium. Right Kidney: Length: 10.6 cm. Increased parenchymal echogenicity is noted. Mild right-sided hydronephrosis is seen. A 1.4 cm cyst is noted at the interpole region of the right kidney, with a thin septation. Left Kidney: Length: 10.2 cm. Increased parenchymal echogenicity is noted. Mild left-sided hydronephrosis is seen. No mass visualized. Abdominal aorta: No aneurysm visualized. Other findings:  None. IMPRESSION: 1. Prominence of the intrahepatic biliary ducts raises question for distal obstruction. Would correlate with LFTs. The common bile duct remains normal in caliber, though a 7 mm stone was noted in the mid common bile duct on the prior ultrasound. 2. Mildly increased renal parenchymal echogenicity raises concern for medical renal disease. 3. Mild bilateral hydronephrosis, of uncertain significance. 4. 1.4 cm right renal cyst incidentally noted, with a thin septation. Electronically Signed   By: Garald Balding M.D.   On: 10/23/2018 23:42   Dg Ercp Biliary & Pancreatic Ducts  Result Date: 10/24/2018 CLINICAL DATA:  Choledocholithiasis EXAM: ERCP with sphincterotomy and stone removal TECHNIQUE: Multiple spot images obtained with the fluoroscopic device and submitted for interpretation post-procedure. FLUOROSCOPY TIME:  Fluoroscopy Time:  7 minutes 33 seconds Radiation Exposure Index (if provided by the fluoroscopic device): None available Number of Acquired Spot Images: 2 COMPARISON:  10/23/2018 FINDINGS: Two spot fluoroscopic views during the ERCP. Limited opacification biliary tree. Sphincterotomy and balloon sweep performed. Please refer to the ERCP report for full details of the procedure. No gross biliary obstruction. IMPRESSION: Limited imaging during ERCP with sphincterotomy and balloon sweep. No biliary obstruction. These images were submitted for radiologic interpretation only. Please  see the procedural report for the amount of contrast and the fluoroscopy time utilized. Electronically Signed   By: Jerilynn Mages.  Shick M.D.   On: 10/24/2018 15:26        Scheduled Meds: . amLODipine  2.5 mg Oral QPM  . atorvastatin  10 mg Oral Daily  . carvedilol  6.25 mg Oral BID WC  . donepezil  10 mg Oral QHS  . doxazosin  2 mg Oral Daily  . phenytoin  200 mg Oral QHS  . pneumococcal 23 valent vaccine  0.5 mL Intramuscular Tomorrow-1000  . venlafaxine XR  150 mg Oral Q breakfast   Continuous  Infusions: . cefTRIAXone (ROCEPHIN)  IV Stopped (10/24/18 2149)     LOS: 2 days    Time spent: [place], MD [facility]  If 7PM-7AM, please contact night-coverage [email or web address] Password [email or web address] 10/25/2018, 11:07 AM

## 2018-10-26 LAB — COMPREHENSIVE METABOLIC PANEL WITH GFR
ALT: 8 U/L (ref 0–44)
AST: 11 U/L — ABNORMAL LOW (ref 15–41)
BUN: 13 mg/dL (ref 8–23)
Chloride: 116 mmol/L — ABNORMAL HIGH (ref 98–111)
Creatinine, Ser: 1.4 mg/dL — ABNORMAL HIGH (ref 0.44–1.00)
GFR calc non Af Amer: 38 mL/min — ABNORMAL LOW (ref >60)
Glucose, Bld: 98 mg/dL (ref 70–99)
Total Bilirubin: 0.6 mg/dL (ref 0.3–1.2)

## 2018-10-26 LAB — CBC
HCT: 33.2 % — ABNORMAL LOW (ref 36.0–46.0)
Hemoglobin: 9.7 g/dL — ABNORMAL LOW (ref 12.0–15.0)
MCH: 26.8 pg (ref 26.0–34.0)
MCHC: 29.2 g/dL — ABNORMAL LOW (ref 30.0–36.0)
MCV: 91.7 fL (ref 80.0–100.0)
Platelets: 125 K/uL — ABNORMAL LOW (ref 150–400)
RBC: 3.62 MIL/uL — ABNORMAL LOW (ref 3.87–5.11)
RDW: 14.5 % (ref 11.5–15.5)
WBC: 11.5 K/uL — ABNORMAL HIGH (ref 4.0–10.5)
nRBC: 0 % (ref 0.0–0.2)

## 2018-10-26 LAB — LIPASE, BLOOD: LIPASE: 720 U/L — AB (ref 11–51)

## 2018-10-26 LAB — COMPREHENSIVE METABOLIC PANEL
Albumin: 3 g/dL — ABNORMAL LOW (ref 3.5–5.0)
Alkaline Phosphatase: 149 U/L — ABNORMAL HIGH (ref 38–126)
Anion gap: 10 (ref 5–15)
CO2: 13 mmol/L — ABNORMAL LOW (ref 22–32)
Calcium: 8 mg/dL — ABNORMAL LOW (ref 8.9–10.3)
GFR calc Af Amer: 44 mL/min — ABNORMAL LOW (ref >60)
Potassium: 3.6 mmol/L (ref 3.5–5.1)
Sodium: 139 mmol/L (ref 135–145)
Total Protein: 5.8 g/dL — ABNORMAL LOW (ref 6.5–8.1)

## 2018-10-26 MED ORDER — SODIUM CHLORIDE 0.9 % IV SOLN
INTRAVENOUS | Status: DC
  Administered 2018-10-26 – 2018-10-28 (×5): via INTRAVENOUS

## 2018-10-26 NOTE — Progress Notes (Signed)
[facility] Progress Note  [name] 67 y.o. 1951-09-02   Subjective: Complains of continued RUQ and epigastric pain  Objective: Vital signs: Vitals:   10/26/18 0151 10/26/18 0544  BP: (!) 178/76 (!) 177/71  Pulse: (!) 102 88  Resp:  16  Temp:  98.3 F (36.8 C)  SpO2:  97%    Physical Exam: Gen: lethargic, no acute distress  HEENT: anicteric sclera CV: RRR Chest: CTA B Abd: upper quadrant tenderness with guarding, soft, nondistended, +BS  Lab Results: Recent Labs    10/23/18 1554  10/25/18 0529 10/26/18 0314  NA 138   < > 136 139  K 4.6   < > 4.7 3.6  CL 115*   < > 116* 116*  CO2 16*   < > 12* 13*  GLUCOSE 87   < > 71 98  BUN 15   < > 22 13  CREATININE 1.42*   < > 1.57* 1.40*  CALCIUM 8.3*   < > 7.8* 8.0*  MG 2.1  --  1.9  --    < > = values in this interval not displayed.   Recent Labs    10/25/18 0529 10/26/18 0314  AST 14* 11*  ALT 11 8  ALKPHOS 153* 149*  BILITOT 0.3 0.6  PROT 5.9* 5.8*  ALBUMIN 2.9* 3.0*   Recent Labs    10/23/18 1554  10/25/18 0529 10/26/18 0314  WBC 8.3   < > 8.8 11.5*  NEUTROABS 4.9  --   --   --   HGB 10.2*   < > 9.1* 9.7*  HCT 35.0*   < > 30.4* 33.2*  MCV 93.6   < > 92.7 91.7  PLT 157   < > 112* 125*   < > = values in this interval not displayed.      Assessment/Plan: Post-ERCP pancreatitis following choledocholithiasis removal and sphincterotomy on 10/24/18. Lipase 720. Changed to NPO and IVFs started. Supportive care. Pain meds prn. If pain not improving over next 1-2 days then will need additional imaging of the pancreas via CT or MRCP. Pancreatic duct placed during ERCP. [name] will f/u tomorrow.   [name] 10/26/2018, 11:29 AM  Questions please call [phone] ID: [name], female   DOB: 05/24/1951, 67 y.o.   MRN: [id number]

## 2018-10-26 NOTE — Progress Notes (Signed)
Pt's blood pressure is 178/76 after Hydralazine PRN given. Notified MD. Will monitor pt.

## 2018-10-26 NOTE — Progress Notes (Signed)
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  Her course was comp gated by post ERCP pancreatitis.   Assessment & Plan:   Active Problems:   ALZHEIMERS DISEASE   COPD (chronic obstructive pulmonary disease) (HCC)   Seizure (HCC)   HTN (hypertension)   Biliary colic  #) Post ERCP pancreatitis: Patient did have pancreatic stent placed.  She began to complain of diffuse abdominal pain that did not improve much.  This morning her lipase was noted to be elevated in the 700s. -N.p.o. 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  She is had episodes of  small bowel bacterial overgrowth.   - Continue sodium bicarbonate 650 mg 3 times daily  #) Recent subdural: Patient apparently has had recent subdural with fall. - Patient taken off clopidogrel for peripheral vascular disease  #) Hypertension/hyperlipidemia: -Continue carvedilol 6.25 mg twice daily -Continue atorvastatin 10 mg daily -Continue amlodipine 2.5 mg q. at bedtime  #) Seizure disorder: -Continue phenytoin 20 mg nightly  #) Peripheral vascular disease status post stent, gated by cholesterol emboli: This appears to be stable -Patient was taken off clopidogrel due to concern for possible subdural hematoma -Continue statin  #) Dementia/pain/psych: -Continue venlafaxine 150 mg every morning -Continue donepezil 10 mg nightly  #) Stage III CKD: -Stable  #) Hypoproliferative anemia: Likely secondary to anemia chronic disease as well as CKD.  Fluids: IV fluids Electrodes: Monitor and supplement Nutrition: N.p.o.  Prophylaxis: Subcu heparin  Disposition: Pending resolution of pancreatitis  Full code  Consultants:   Gastroenterology Dr. Watt Climes  General surgery consult  Procedures:  10/24/2018 ERCP: The major papilla appeared normal. - Choledocholithiasis was found. Complete removal was accomplished by biliary sphincterotomy and balloon extraction. - One plastic stent was placed into the ventral pancreatic duct to decrease the chance of pancreatitis and assist with cannulation. - A biliary sphincterotomy was performed. - The biliary tree was swept. Impression: moderate sedation-none Moderate Sedation: - Clear liquid diet today.if doing well may have soft solids tomorrow - Continue present medications. - Return to GI clinic PRN. - Telephone GI clinic if symptomatic PRN. - Perform a flat plate and upright abdominal x-ray in 1 week. to confirm pancreatic stent passing - Refer to a surgeon at appointment to be scheduled. possibly this can wait since she may not  have any residual stones in  the gallbladder or can see a surgeon in her home  town who had seen her in the emergency room or at least talk to the ER physician  Antimicrobials:   IV ceftriaxone started 10/23/2018   Subjective: This morning patient reports that he continues to have right upper quadrant pain and epigastric pain.  She denies any nausea, vomiting, diarrhea.  She reports she continues to have bowel movements and pass flatus.  She denies any fevers, chest pain, cough, congestion, rhinorrhea.  Objective: Vitals:   10/25/18 2049 10/25/18 2334 10/26/18 0151 10/26/18 0544  BP: (!) 187/75 (!) 183/75 (!) 178/76 (!) 177/71  Pulse: 86  (!) 102 88  Resp: 16   16  Temp: 99.5 F (37.5 C)   98.3 F (36.8 C)  TempSrc: Oral   Oral  SpO2: 95%   97%  Weight:      Height:        Intake/Output Summary (Last 24 hours) at 10/26/2018 1259 Last data filed at 10/26/2018 2706 Gross per 24 hour  Intake 220 ml  Output 2850 ml  Net -2630 ml   Filed Weights   10/23/18 1343  Weight: 45.1 kg    Examination:  General exam: No acute distress Respiratory system: Clear to auscultation. Respiratory effort normal. Cardiovascular system: Regular rate and rhythm, no murmurs Gastrointestinal system: Abdomen is soft, mildly distended, tender to deep palpation over right epigastric area, hypoactive bowel sounds, no rebound or guarding,, neobladder site is clean dry and intact Central nervous system: Alert and oriented. No focal neurological deficits. Extremities: Trace lower extremity edema Skin: Healed incisions over abdomen Psychiatry: Judgement and insight appear normal. Mood & affect appropriate.     Data Reviewed: I have personally reviewed following labs and imaging studies  CBC: Recent Labs  Lab 10/23/18 1554 10/24/18 0325 10/25/18 0529 10/26/18 0314  WBC 8.3 5.3 8.8 11.5*  NEUTROABS 4.9  --   --   --   HGB 10.2* 9.5* 9.1* 9.7*  HCT 35.0* 31.9* 30.4* 33.2*  MCV 93.6 92.7 92.7  91.7  PLT 157 132* 112* 237*   Basic Metabolic Panel: Recent Labs  Lab 10/23/18 1554 10/24/18 0325 10/25/18 0529 10/26/18 0314  NA 138 138 136 139  K 4.6 4.7 4.7 3.6  CL 115* 117* 116* 116*  CO2 16* 15* 12* 13*  GLUCOSE 87 81 71 98  BUN 15 14 22 13   CREATININE 1.42* 1.62* 1.57* 1.40*  CALCIUM 8.3* 8.0* 7.8* 8.0*  MG 2.1  --  1.9  --    GFR: Estimated Creatinine Clearance: 27.8 mL/min (A) (by C-G formula based on SCr of 1.4 mg/dL (H)). Liver Function Tests: Recent Labs  Lab 10/23/18 1554 10/24/18 0325 10/25/18 0529 10/26/18 0314  AST 16 13* 14* 11*  ALT 11 10 11 8   ALKPHOS 166* 166* 153* 149*  BILITOT 0.2* 0.4 0.3 0.6  PROT 6.4* 6.0* 5.9* 5.8*  ALBUMIN 3.3* 2.9* 2.9* 3.0*   Recent Labs  Lab 10/23/18 1554 10/26/18 0314  LIPASE 33 720*  AMYLASE 97  --    No results for input(s): AMMONIA in the last 168 hours. Coagulation Profile: Recent Labs  Lab 10/23/18 1554 10/24/18 0325  INR 1.08 1.16   Cardiac Enzymes: No results for input(s): CKTOTAL, CKMB, CKMBINDEX, TROPONINI in the last 168 hours. BNP (last 3 results) No results for input(s): PROBNP in the last 8760 hours. HbA1C: No results for input(s): HGBA1C in the last 72 hours. CBG: No results for input(s): GLUCAP in the last  168 hours. Lipid Profile: No results for input(s): CHOL, HDL, LDLCALC, TRIG, CHOLHDL, LDLDIRECT in the last 72 hours. Thyroid Function Tests: No results for input(s): TSH, T4TOTAL, FREET4, T3FREE, THYROIDAB in the last 72 hours. Anemia Panel: No results for input(s): VITAMINB12, FOLATE, FERRITIN, TIBC, IRON, RETICCTPCT in the last 72 hours. Sepsis Labs: No results for input(s): PROCALCITON, LATICACIDVEN in the last 168 hours.  Recent Results (from the past 240 hour(s))  Urine culture     Status: None   Collection Time: 10/23/18  8:14 PM  Result Value Ref Range Status   Specimen Description URINE, SUPRAPUBIC  Final   Special Requests   Final    NONE Performed at [facility], [address], [place], [place] [address]    Culture   Final    Multiple bacterial morphotypes present, none predominant. Suggest appropriate recollection if clinically indicated.   Report Status 10/25/2018 FINAL  Final         Radiology Studies: Dg Ercp Biliary & Pancreatic Ducts  Result Date: 10/24/2018 CLINICAL DATA:  Choledocholithiasis EXAM: ERCP with sphincterotomy and stone removal TECHNIQUE: Multiple spot images obtained with the fluoroscopic device and submitted for interpretation post-procedure. FLUOROSCOPY TIME:  Fluoroscopy Time:  7 minutes 33 seconds Radiation Exposure Index (if provided by the fluoroscopic device): None available Number of Acquired Spot Images: 2 COMPARISON:  10/23/2018 FINDINGS: Two spot fluoroscopic views during the ERCP. Limited opacification biliary tree. Sphincterotomy and balloon sweep performed. Please refer to the ERCP report for full details of the procedure. No gross biliary obstruction. IMPRESSION: Limited imaging during ERCP with sphincterotomy and balloon sweep. No biliary obstruction. These images were submitted for radiologic interpretation only. Please see the procedural report for the amount of contrast and the fluoroscopy time utilized. Electronically Signed   By: Jerilynn Mages.  Shick M.D.   On: 10/24/2018 15:26        Scheduled Meds: . amLODipine  2.5 mg Oral QPM  . atorvastatin  10 mg Oral Daily  . carvedilol  6.25 mg Oral BID WC  . donepezil  10 mg Oral QHS  . doxazosin  2 mg Oral Daily  . phenytoin  200 mg Oral QHS  . pneumococcal 23 valent vaccine  0.5 mL Intramuscular Tomorrow-1000  . sodium bicarbonate  650 mg Oral TID  . venlafaxine XR  150 mg Oral Q breakfast   Continuous Infusions: . sodium chloride    . cefTRIAXone (ROCEPHIN)  IV Stopped (10/25/18 2038)     LOS: 3 days    Time spent: [place], MD [facility]  If 7PM-7AM, please contact night-coverage [email or web address] Password  [email or web address] 10/26/2018, 12:59 PM

## 2018-10-27 ENCOUNTER — Inpatient Hospital Stay (HOSPITAL_COMMUNITY): Payer: Medicare HMO

## 2018-10-27 ENCOUNTER — Encounter (HOSPITAL_COMMUNITY): Payer: Self-pay | Admitting: Gastroenterology

## 2018-10-27 DIAGNOSIS — K8050 Calculus of bile duct without cholangitis or cholecystitis without obstruction: Principal | ICD-10-CM

## 2018-10-27 LAB — COMPREHENSIVE METABOLIC PANEL
Alkaline Phosphatase: 129 U/L — ABNORMAL HIGH (ref 38–126)
Anion gap: 8 (ref 5–15)
Calcium: 7.7 mg/dL — ABNORMAL LOW (ref 8.9–10.3)
GFR calc Af Amer: 50 mL/min — ABNORMAL LOW (ref >60)
GFR calc non Af Amer: 43 mL/min — ABNORMAL LOW (ref >60)
Total Bilirubin: 0.4 mg/dL (ref 0.3–1.2)
Total Protein: 5.8 g/dL — ABNORMAL LOW (ref 6.5–8.1)

## 2018-10-27 LAB — CBC
HCT: 28.2 % — ABNORMAL LOW (ref 36.0–46.0)
Hemoglobin: 8.4 g/dL — ABNORMAL LOW (ref 12.0–15.0)
MCH: 27.4 pg (ref 26.0–34.0)
MCHC: 29.8 g/dL — ABNORMAL LOW (ref 30.0–36.0)
MCV: 91.9 fL (ref 80.0–100.0)
Platelets: UNDETERMINED K/uL (ref 150–400)
RBC: 3.07 MIL/uL — ABNORMAL LOW (ref 3.87–5.11)
RDW: 15 % (ref 11.5–15.5)
WBC: 8.5 K/uL (ref 4.0–10.5)
nRBC: 0 % (ref 0.0–0.2)

## 2018-10-27 LAB — COMPREHENSIVE METABOLIC PANEL WITH GFR
ALT: 9 U/L (ref 0–44)
AST: 13 U/L — ABNORMAL LOW (ref 15–41)
Albumin: 2.7 g/dL — ABNORMAL LOW (ref 3.5–5.0)
BUN: 16 mg/dL (ref 8–23)
CO2: 15 mmol/L — ABNORMAL LOW (ref 22–32)
Chloride: 119 mmol/L — ABNORMAL HIGH (ref 98–111)
Creatinine, Ser: 1.25 mg/dL — ABNORMAL HIGH (ref 0.44–1.00)
Glucose, Bld: 75 mg/dL (ref 70–99)
Potassium: 3.8 mmol/L (ref 3.5–5.1)
Sodium: 142 mmol/L (ref 135–145)

## 2018-10-27 LAB — MAGNESIUM: Magnesium: 1.8 mg/dL (ref 1.7–2.4)

## 2018-10-27 MED ORDER — HYDROMORPHONE HCL 1 MG/ML IJ SOLN
1.0000 mg | INTRAMUSCULAR | Status: DC | PRN
  Administered 2018-10-27 – 2018-10-31 (×11): 1 mg via INTRAVENOUS
  Filled 2018-10-27 (×11): qty 1

## 2018-10-27 MED ORDER — SODIUM CHLORIDE 0.9% FLUSH
10.0000 mL | INTRAVENOUS | Status: DC | PRN
  Administered 2018-10-28 – 2018-10-29 (×2): 10 mL
  Filled 2018-10-27 (×2): qty 40

## 2018-10-27 MED ORDER — IOHEXOL 300 MG/ML  SOLN
100.0000 mL | Freq: Once | INTRAMUSCULAR | Status: AC | PRN
  Administered 2018-10-27: 100 mL via INTRAVENOUS

## 2018-10-27 NOTE — Progress Notes (Signed)
   10/27/18 1000  Clinical Encounter Type  Visited With Patient  Visit Type Initial;Spiritual support  Spiritual Encounters  Spiritual Needs Emotional;Prayer  Stress Factors  Patient Stress Factors Health changes   Met w/ pt, prayed at her request.  Chaplain available for add'l support as desired.  Myra Gianotti resident, [phone]

## 2018-10-27 NOTE — Progress Notes (Signed)
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  Her course was complicated by post ERCP pancreatitis.  She was transferred here for further evaluation and management  Assessment & Plan:   Active Problems:   ALZHEIMERS DISEASE   COPD (chronic obstructive pulmonary disease) (HCC)   Seizure (HCC)   HTN (hypertension)   Biliary colic   #) Post ERCP pancreatitis: Patient did have pancreatic stent placed.  She began to complain of diffuse abdominal pain that did not improve much.    Yesterday morning her lipase was noted to be elevated in the 700s. -N.p.o. -IV fluids -Pain control -GI has ordered a repeat CT scan. --I have increased her Dilaudid to 1 mg every 2 hours as needed severe pain.  Patient stated that the Dilaudid 0.5 mg dose only lasted 1 hour.  Discussed with nursing regarding monitoring patient closely for pain results of pain medication dosing.  #) choledocholithiasis status post ERCP on 10/24/2018:  - GI following, appreciate recommendations Patient will need upright and flatplate in 1 week approximately 10/31/2018 to evaluate for pancreatic stent placed -Per gastroenterology patient can follow-up as an outpatient for cholecystectomy  #) Neobladder, complicated by recurrent UTIs: Urine culture from [facility] growing E.  coli -urine culture from here on 10/23/2018 no growth to date -Continue IV ceftriaxone started 10/23/2018 -Continue doxazosin 2 mg daily  #) Status post right hemicolectomy, complicated by blind loop syndrome: Patient has chronic diarrhea and 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  She is had episodes of small bowel bacterial overgrowth.   - Continue sodium bicarbonate 650 mg 3 times daily  #) Recent subdural: Patient apparently has had recent subdural with fall. - Patient taken off clopidogrel for peripheral vascular disease  #) Hypertension/hyperlipidemia: -Continue carvedilol 6.25 mg twice daily -Continue atorvastatin 10 mg daily -Continue amlodipine 2.5 mg q. at bedtime  #) Seizure disorder: -Continue phenytoin 20 mg nightly  #) Peripheral vascular disease status post stent, gated by cholesterol emboli: This appears to be stable -Patient was taken off clopidogrel due to concern for possible subdural hematoma -Continue statin  #) Dementia/pain/psych: -Continue venlafaxine 150 mg every morning -Continue donepezil 10 mg nightly  #) Stage III CKD: -Stable  #) Hypoproliferative anemia: Likely secondary to anemia chronic disease as well as CKD.  Fluids: IV fluids Electrodes: Monitor and supplement Nutrition: N.p.o.  Prophylaxis: Subcu heparin  Disposition: Pending resolution of pancreatitis  Full code  Consultants:   Gastroenterology Dr. Watt Climes  General surgery consult  Procedures:  10/24/2018 ERCP: The major papilla appeared normal. - Choledocholithiasis was found. Complete removal was accomplished by biliary sphincterotomy and balloon extraction. - One plastic stent was placed into the ventral pancreatic duct to decrease the chance of pancreatitis and assist with cannulation. - A biliary sphincterotomy was performed. - The biliary tree was swept. Impression: moderate sedation-none Moderate Sedation: - Clear liquid diet today.if doing  well may have soft solids tomorrow - Continue present medications. - Return  to GI clinic PRN. - Telephone GI clinic if symptomatic PRN. - Perform a flat plate and upright abdominal x-ray in 1 week. to confirm pancreatic stent passing - Refer to a surgeon at appointment to be scheduled. possibly this can wait since she may not have any residual stones in the gallbladder or can see a surgeon in her home  town who had seen her in the emergency room or at least talk to the ER physician  Antimicrobials:   IV ceftriaxone started 10/23/2018    Subjective: Patient with continued severe pain.  Objective: Vitals:   10/26/18 0544 10/26/18 1319 10/26/18 2043 10/27/18 0503  BP: (!) 177/71 (!) 196/71 (!) 148/65 (!) 155/57  Pulse: 88 79 72 71  Resp: 16 17 14 16   Temp: 98.3 F (36.8 C) 98 F (36.7 C) 98.1 F (36.7 C) 98.3 F (36.8 C)  TempSrc: Oral Oral Oral Oral  SpO2: 97% 98% 95% 97%  Weight:      Height:        Intake/Output Summary (Last 24 hours) at 10/27/2018 1341 Last data filed at 10/27/2018 0931 Gross per 24 hour  Intake 1282.85 ml  Output 1400 ml  Net -117.15 ml   Filed Weights   10/23/18 1343  Weight: 45.1 kg    Examination:  General exam: Appears calm and comfortable  Respiratory system: Clear to auscultation. Respiratory effort normal. Cardiovascular system: S1 & S2 heard, RRR. No JVD, murmurs, rubs, gallops or clicks. No pedal edema. Gastrointestinal system: Abdomen is nondistended, soft and nontender. No organomegaly or masses felt. Normal bowel sounds heard. Central nervous system: Alert and oriented. No focal neurological deficits. Extremities: Symmetric 5 x 5 power. Skin: No rashes, lesions or ulcers Psychiatry: Judgement and insight appear normal. Mood & affect appropriate.     Data Reviewed: I have personally reviewed following labs and imaging studies  CBC: Recent Labs  Lab 10/23/18 1554 10/24/18 0325 10/25/18 0529 10/26/18 0314  10/27/18 0218  WBC 8.3 5.3 8.8 11.5* 8.5  NEUTROABS 4.9  --   --   --   --   HGB 10.2* 9.5* 9.1* 9.7* 8.4*  HCT 35.0* 31.9* 30.4* 33.2* 28.2*  MCV 93.6 92.7 92.7 91.7 91.9  PLT 157 132* 112* 125* PLATELET CLUMPS NOTED ON SMEAR, UNABLE TO ESTIMATE   Basic Metabolic Panel: Recent Labs  Lab 10/23/18 1554 10/24/18 0325 10/25/18 0529 10/26/18 0314 10/27/18 0218  NA 138 138 136 139 142  K 4.6 4.7 4.7 3.6 3.8  CL 115* 117* 116* 116* 119*  CO2 16* 15* 12* 13* 15*  GLUCOSE 87 81 71 98 75  BUN 15 14 22 13 16   CREATININE 1.42* 1.62* 1.57* 1.40* 1.25*  CALCIUM 8.3* 8.0* 7.8* 8.0* 7.7*  MG 2.1  --  1.9  --  1.8   GFR: Estimated Creatinine Clearance: 31.1 mL/min (A) (by C-G formula based on SCr of 1.25 mg/dL (H)). Liver Function Tests: Recent Labs  Lab 10/23/18 1554 10/24/18 0325 10/25/18 0529 10/26/18 0314 10/27/18 0218  AST 16 13* 14* 11* 13*  ALT 11 10 11 8 9   ALKPHOS 166* 166* 153* 149* 129*  BILITOT 0.2* 0.4 0.3 0.6 0.4  PROT 6.4* 6.0* 5.9* 5.8* 5.8*  ALBUMIN 3.3* 2.9* 2.9* 3.0* 2.7*   Recent Labs  Lab 10/23/18 1554 10/26/18 0314  LIPASE 33 720*  AMYLASE 97  --    No results for input(s): AMMONIA in the last 168 hours. Coagulation Profile: Recent Labs  Lab 10/23/18 1554 10/24/18 0325  INR 1.08 1.16    Recent Results (from the past 240 hour(s))  Urine culture     Status: None   Collection Time: 10/23/18  8:14 PM  Result Value Ref Range Status   Specimen Description URINE, SUPRAPUBIC  Final   Special Requests   Final    NONE Performed at [facility], [address], [place], [place] [address]    Culture   Final    Multiple bacterial morphotypes present, none predominant. Suggest appropriate recollection if clinically indicated.   Report Status 10/25/2018 FINAL  Final    Radiology Studies: No results found.   Scheduled Meds: . amLODipine  2.5 mg Oral QPM  . atorvastatin  10 mg Oral Daily  . carvedilol  6.25 mg Oral BID WC  . donepezil  10 mg  Oral QHS  . doxazosin  2 mg Oral Daily  . phenytoin  200 mg Oral QHS  . pneumococcal 23 valent vaccine  0.5 mL Intramuscular Tomorrow-1000  . sodium bicarbonate  650 mg Oral TID  . venlafaxine XR  150 mg Oral Q breakfast   Continuous Infusions: . sodium chloride 100 mL/hr at 10/27/18 0834  . cefTRIAXone (ROCEPHIN)  IV 1 g (10/26/18 2233)     LOS: 4 days    Time spent: 30 minutes    [name] Deutscher, MD FACP [facility] Pager [phone]  If 7PM-7AM, please contact night-coverage [email or web address] Password [email or web address] 10/27/2018, 1:41 PM

## 2018-10-27 NOTE — Anesthesia Postprocedure Evaluation (Signed)
Anesthesia Post Note  Patient: [name]  Procedure(s) Performed: ENDOSCOPIC RETROGRADE CHOLANGIOPANCREATOGRAPHY (ERCP) (N/A ) PANCREATIC STENT PLACEMENT SPHINCTEROTOMY REMOVAL OF STONES     Patient location during evaluation: PACU Anesthesia Type: General Level of consciousness: awake and alert Pain management: pain level controlled Vital Signs Assessment: post-procedure vital signs reviewed and stable Respiratory status: spontaneous breathing, nonlabored ventilation and respiratory function stable Cardiovascular status: blood pressure returned to baseline and stable Postop Assessment: no apparent nausea or vomiting Anesthetic complications: no    Last Vitals:  Vitals:   10/26/18 2043 10/27/18 0503  BP: (!) 148/65 (!) 155/57  Pulse: 72 71  Resp: 14 16  Temp: 36.7 C 36.8 C  SpO2: 95% 97%    Last Pain:  Vitals:   10/27/18 0503  TempSrc: Oral  PainSc:    Pain Goal:                 Lidia Collum

## 2018-10-27 NOTE — Progress Notes (Signed)
Subjective: Intermittent, but ongoing, EG/RUQ pain, requiring periodic analgesia.  Objective: Vital signs in last 24 hours: Temp:  [98 F (36.7 C)-98.3 F (36.8 C)] 98.3 F (36.8 C) (11/18 0503) Pulse Rate:  [71-79] 71 (11/18 0503) Resp:  [14-17] 16 (11/18 0503) BP: (148-196)/(57-71) 155/57 (11/18 0503) SpO2:  [95 %-98 %] 97 % (11/18 0503) Weight change:  Last BM Date: 10/25/18  PE: GEN:  Uncomfortable- but not toxic-appearing ABD:  Protuberant, EG/RUQ tenderness with voluntary guarding, hypoactive bowel sounds.  Lab Results: CBC    Component Value Date/Time   WBC 8.5 10/27/2018 0218   RBC 3.07 (L) 10/27/2018 0218   HGB 8.4 (L) 10/27/2018 0218   HGB 11.0 (L) 10/13/2018 1127   HCT 28.2 (L) 10/27/2018 0218   HCT 35.6 10/13/2018 1127   PLT PLATELET CLUMPS NOTED ON SMEAR, UNABLE TO ESTIMATE 10/27/2018 0218   PLT 204 10/13/2018 1127   MCV 91.9 10/27/2018 0218   MCV 87 10/13/2018 1127   MCH 27.4 10/27/2018 0218   MCHC 29.8 (L) 10/27/2018 0218   RDW 15.0 10/27/2018 0218   RDW 13.6 10/13/2018 1127   LYMPHSABS 2.5 10/23/2018 1554   LYMPHSABS 2.3 10/13/2018 1127   MONOABS 0.8 10/23/2018 1554   EOSABS 0.1 10/23/2018 1554   EOSABS 0.2 10/13/2018 1127   BASOSABS 0.1 10/23/2018 1554   BASOSABS 0.1 10/13/2018 1127   CMP     Component Value Date/Time   NA 142 10/27/2018 0218   NA 138 10/13/2018 1127   K 3.8 10/27/2018 0218   CL 119 (H) 10/27/2018 0218   CO2 15 (L) 10/27/2018 0218   GLUCOSE 75 10/27/2018 0218   BUN 16 10/27/2018 0218   BUN 15 10/13/2018 1127   CREATININE 1.25 (H) 10/27/2018 0218   CALCIUM 7.7 (L) 10/27/2018 0218   PROT 5.8 (L) 10/27/2018 0218   PROT 7.1 10/13/2018 1127   ALBUMIN 2.7 (L) 10/27/2018 0218   ALBUMIN 4.1 10/13/2018 1127   AST 13 (L) 10/27/2018 0218   ALT 9 10/27/2018 0218   ALKPHOS 129 (H) 10/27/2018 0218   BILITOT 0.4 10/27/2018 0218   BILITOT <0.2 10/13/2018 1127   GFRNONAA 43 (L) 10/27/2018 0218   GFRAA 50 (L) 10/27/2018 0218    Assessment:  1.  Choledocholithiasis, s/p ERCP 3 days ago with stone extraction. 2.  Post-ERCP pancreatitis. 3.  Ongoing abdominal pain, which patient tells me started after her ERCP, and is not same as symptoms pre-ERCP.  No fevers or leukocytosis but has voluntary guarding.  Plan:  1.  Pancreatic protocol CT abdomen with contrast for further evaluation of ongoing abdominal pain. 2.  Analgesics, antibiotics, IV fluids, supportive care in the meantime. 3.  [facility] will follow.   Landry Dyke 10/27/2018, 11:15 AM   Cell [phone] If no answer or after 5 PM call [phone]

## 2018-10-28 LAB — COMPREHENSIVE METABOLIC PANEL
ALK PHOS: 115 U/L (ref 38–126)
ALT: 8 U/L (ref 0–44)
AST: 13 U/L — ABNORMAL LOW (ref 15–41)
Albumin: 2.4 g/dL — ABNORMAL LOW (ref 3.5–5.0)
Anion gap: 9 (ref 5–15)
BILIRUBIN TOTAL: 0.6 mg/dL (ref 0.3–1.2)
BUN: 16 mg/dL (ref 8–23)
CALCIUM: 7.7 mg/dL — AB (ref 8.9–10.3)
CO2: 14 mmol/L — ABNORMAL LOW (ref 22–32)
CREATININE: 1.21 mg/dL — AB (ref 0.44–1.00)
Chloride: 116 mmol/L — ABNORMAL HIGH (ref 98–111)
GFR calc Af Amer: 52 mL/min — ABNORMAL LOW (ref >60)
GFR, EST NON AFRICAN AMERICAN: 45 mL/min — AB (ref >60)
Glucose, Bld: 56 mg/dL — ABNORMAL LOW (ref 70–99)
Potassium: 3.8 mmol/L (ref 3.5–5.1)
Sodium: 139 mmol/L (ref 135–145)
TOTAL PROTEIN: 5.4 g/dL — AB (ref 6.5–8.1)

## 2018-10-28 LAB — LIPASE, BLOOD: Lipase: 115 U/L — ABNORMAL HIGH (ref 11–51)

## 2018-10-28 LAB — CBC
HCT: 27.4 % — ABNORMAL LOW (ref 36.0–46.0)
HEMOGLOBIN: 7.9 g/dL — AB (ref 12.0–15.0)
MCH: 27.2 pg (ref 26.0–34.0)
MCHC: 28.8 g/dL — AB (ref 30.0–36.0)
MCV: 94.5 fL (ref 80.0–100.0)
PLATELETS: 93 10*3/uL — AB (ref 150–400)
RBC: 2.9 MIL/uL — ABNORMAL LOW (ref 3.87–5.11)
RDW: 14.9 % (ref 11.5–15.5)
WBC: 6.6 10*3/uL (ref 4.0–10.5)
nRBC: 0 % (ref 0.0–0.2)

## 2018-10-28 MED ORDER — DEXTROSE-NACL 5-0.9 % IV SOLN
INTRAVENOUS | Status: DC
  Administered 2018-10-28 – 2018-10-31 (×6): via INTRAVENOUS

## 2018-10-28 NOTE — Progress Notes (Signed)
Subjective: Abdominal pain slightly improving. Is a little hungry.  Objective: Vital signs in last 24 hours: Temp:  [97.4 F (36.3 C)-98.2 F (36.8 C)] 98.2 F (36.8 C) (11/19 0459) Pulse Rate:  [59-62] 62 (11/19 0459) Resp:  [14-15] 15 (11/18 2118) BP: (147-177)/(54-64) 170/54 (11/19 0459) SpO2:  [95 %-99 %] 98 % (11/19 0459) Weight change:  Last BM Date: 10/27/18  PE: GEN:  Somewhat uncomfortable-appearing but NAD ABD:  Soft, protuberant, mild epigastric tenderness without peritonitis.  Lab Results: CBC    Component Value Date/Time   WBC 6.6 10/28/2018 0348   RBC 2.90 (L) 10/28/2018 0348   HGB 7.9 (L) 10/28/2018 0348   HGB 11.0 (L) 10/13/2018 1127   HCT 27.4 (L) 10/28/2018 0348   HCT 35.6 10/13/2018 1127   PLT 93 (L) 10/28/2018 0348   PLT 204 10/13/2018 1127   MCV 94.5 10/28/2018 0348   MCV 87 10/13/2018 1127   MCH 27.2 10/28/2018 0348   MCHC 28.8 (L) 10/28/2018 0348   RDW 14.9 10/28/2018 0348   RDW 13.6 10/13/2018 1127   LYMPHSABS 2.5 10/23/2018 1554   LYMPHSABS 2.3 10/13/2018 1127   MONOABS 0.8 10/23/2018 1554   EOSABS 0.1 10/23/2018 1554   EOSABS 0.2 10/13/2018 1127   BASOSABS 0.1 10/23/2018 1554   BASOSABS 0.1 10/13/2018 1127   CMP     Component Value Date/Time   NA 139 10/28/2018 0348   NA 138 10/13/2018 1127   K 3.8 10/28/2018 0348   CL 116 (H) 10/28/2018 0348   CO2 14 (L) 10/28/2018 0348   GLUCOSE 56 (L) 10/28/2018 0348   BUN 16 10/28/2018 0348   BUN 15 10/13/2018 1127   CREATININE 1.21 (H) 10/28/2018 0348   CALCIUM 7.7 (L) 10/28/2018 0348   PROT 5.4 (L) 10/28/2018 0348   PROT 7.1 10/13/2018 1127   ALBUMIN 2.4 (L) 10/28/2018 0348   ALBUMIN 4.1 10/13/2018 1127   AST 13 (L) 10/28/2018 0348   ALT 8 10/28/2018 0348   ALKPHOS 115 10/28/2018 0348   BILITOT 0.6 10/28/2018 0348   BILITOT <0.2 10/13/2018 1127   GFRNONAA 45 (L) 10/28/2018 0348   GFRAA 52 (L) 10/28/2018 0348   Studies/Results: Ct Abdomen Pelvis W Contrast  Result Date:  10/28/2018 CLINICAL DATA:  Right upper quadrant pain. Cholelithiasis and post ERCP on 10/24/2018. Now with posterior CP pancreatitis. Weight loss. EXAM: CT ABDOMEN AND PELVIS WITH CONTRAST TECHNIQUE: Multidetector CT imaging of the abdomen and pelvis was performed using the standard protocol following bolus administration of intravenous contrast. CONTRAST:  148mL OMNIPAQUE IOHEXOL 300 MG/ML  SOLN COMPARISON:  08/17/2018 FINDINGS: Lower chest: Small bilateral pleural effusions with atelectasis in the lung bases. Hepatobiliary: No focal liver lesions. Pneumobilia is likely postprocedural. Increased density in the gallbladder may represent vicarious excretion of contrast material or gallbladder sludge. No gallbladder wall thickening or discrete stones. Pancreas: Swelling of the head of the pancreas with infiltration and edema around the head and body of the pancreas and extending to the anterior pararenal spaces. Changes are consistent with acute pancreatitis. There is a stent in the pancreatic duct. No pancreatic ductal dilatation. No loculated collections. No evidence of pancreatic necrosis. Spleen: Calcified granulomas in the spleen. Adrenals/Urinary Tract: No adrenal gland nodules. Bilateral renal parenchymal atrophy, greater on the left. Left ureteral stent is in place. Persistent left hydronephrosis possibly due to reflux. Bladder is incompletely distended. Foley catheter is present. Stomach/Bowel: Stomach, small bowel, and colon are not abnormally distended. No bowel wall thickening or inflammatory changes. Contrast  material flows through to the rectum without evidence of obstruction. Appendix is not identified. Vascular/Lymphatic: Extensive vascular calcifications of the aorta and branch vessels. Left common iliac artery stent. No significant lymphadenopathy. Mild prominence of retroperitoneal lymph nodes, likely reactive. No pathologic enlargement. Reproductive: Status post hysterectomy. No adnexal masses.  Other: No abdominal wall hernia or abnormality. No abdominopelvic ascites. Musculoskeletal: Healing fracture of the right superior and inferior pubic ramus. No acute bony abnormalities. IMPRESSION: 1. Changes of acute pancreatitis with peripancreatic infiltration and edema. Pancreatic duct stent in place. No evidence of pancreatic necrosis or loculated fluid collection. 2. Small bilateral pleural effusions with basilar atelectasis. 3. Extensive vascular calcifications. 4. Healing fracture of the right superior and inferior pubic ramus. Electronically Signed   By: Lucienne Capers M.D.   On: 10/28/2018 01:43   Assessment:  1.  Choledocholithiasis, s/p ERCP 4 days ago with stone extraction. 2.  Post-ERCP pancreatitis. 3.  Ongoing abdominal pain, which patient tells me started after her ERCP, and is not same as symptoms pre-ERCP.  No fevers or leukocytosis but has voluntary guarding.  Has improved a little bit over the past 24 hours.  CT yesterday with pancreatitis but without abscess/fluid collection/necrosis  Plan:  1.  Continue supportive care for pancreatitis. 2.  Trial clear liquid diet. 3.  OOBTC, mobilize as tolerated. 4.  Case reviewed with Dr. Evangeline Gula.   Landry Dyke 10/28/2018, 9:58 AM   Cell [phone] If no answer or after 5 PM call [phone]

## 2018-10-28 NOTE — Progress Notes (Signed)
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 Her course was complicated by post ERCP pancreatitis.  She was transferred here for further evaluation and management.  Due to persistent pain a CT scan of the abdomen and pelvis was obtained yesterday.  Fortunately it did not show any necrotizing pancreatitis.  Assessment & Plan:   Active Problems:   ALZHEIMERS DISEASE   COPD (chronic obstructive pulmonary disease) (HCC)   Seizure ([name])   HTN (hypertension)   Biliary colic   #)Post ERCP pancreatitis: Patient did have pancreatic stent placed. She began to complain of diffuse abdominal pain that did not improve much.   Yesterday morning her lipase was noted to be elevated in the 700s. -N.p.o. -IV fluids -Pain control -GI has ordered a repeat CT scan which did not show pancreatic necrosis --I have increased her Dilaudid to 1 mg every 2 hours as needed severe pain.    Is has improved her pain control. Case discussed with Dr. Paulita Fujita  #) choledocholithiasis status post ERCP on 10/24/2018:  - GI following, appreciate recommendations Patient will need upright and flatplate in 1 week approximately 10/31/2018 to evaluate for pancreatic stent placed -CT scan did not show any acute necrotic necrosis  #) Neobladder, complicated by recurrent  UTIs:Urine culture from [facility] growing E. 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 She is had episodes of small bowel bacterial overgrowth.  -Continuesodium bicarbonate 650 mg 3 times daily  #) Recent subdural: Patient apparently has had recent subdural with fall. - Patient taken off clopidogrel for peripheral vascular disease  #) Hypertension/hyperlipidemia: -Continue carvedilol 6.25 mg twice daily -Continue atorvastatin 10 mg daily -Continue amlodipine 2.5 mg q. at bedtime  #) Seizure disorder: -Continue phenytoin 20 mg nightly  #) Peripheral vascular disease status post stent, gated by cholesterol emboli: This appears to be stable -Patient was taken off clopidogrel due to concern for possible subdural hematoma -Continue statin  #) Dementia/pain/psych: -Continue venlafaxine 150 mg every morning -Continue donepezil 10 mg nightly  #) Stage III CKD: -Stable  #) Hypoproliferative anemia: Likely secondary to anemia chronic disease as well as CKD.  Fluids:IV fluids Electrodes: Monitor and supplement Nutrition:N.p.o.  Prophylaxis: Subcu heparin  Disposition: Pendingresolution of pancreatitis  Full code  Consultants:  Gastroenterology Dr. Watt Climes  General surgery consult  Procedures: 10/24/2018 ERCP: The major papilla appeared normal. - Choledocholithiasis was found. Complete removal was accomplished by biliary sphincterotomy and balloon extraction. - One plastic stent was placed into the ventral pancreatic duct to decrease the chance of pancreatitis and assist with cannulation. - A biliary sphincterotomy was performed. - The biliary tree was swept. Impression: moderate  sedation-none Moderate Sedation: - Clear liquid diet today.if doing well may have soft solids tomorrow - Continue present medications. - Return  to GI clinic PRN. - Telephone GI clinic if symptomatic PRN. - Perform a flat plate and upright abdominal x-ray in 1 week. to confirm pancreatic stent passing - Refer to a surgeon at appointment to be scheduled. possibly this can wait since she may not have any residual stones in the gallbladder or can see a surgeon in her [place]  town who had seen her in the emergency room or at least talk to the ER physician  Antimicrobials:   IV ceftriaxone started 10/23/2018 Subjective: Patient's pain better controlled today.  Has had a bowel movement this morning.  Her blood glucoses were fairly low and I have changed her IV fluid to D5 normal saline.  Objective: Vitals:   10/27/18 0503 10/27/18 1448 10/27/18 2118 10/28/18 0459  BP: (!) 155/57 (!) 177/54 (!) 147/64 (!) 170/54  Pulse: 71 (!) 59 (!) 59 62  Resp: 16 14 15    Temp: 98.3 F (36.8 C) (!) 97.5 F (36.4 C) (!) 97.4 F (36.3 C) 98.2 F (36.8 C)  TempSrc: Oral Oral Oral Oral  SpO2: 97% 99% 95% 98%  Weight:      Height:        Intake/Output Summary (Last 24 hours) at 10/28/2018 1429 Last data filed at 10/28/2018 1123 Gross per 24 hour  Intake 2176.48 ml  Output 800 ml  Net 1376.48 ml   Filed Weights   10/23/18 1343  Weight: 45.1 kg    Examination:  General exam: Appears calm and comfortable  Respiratory system: Clear to auscultation. Respiratory effort normal. Cardiovascular system: S1 & S2 heard, RRR. No JVD, murmurs, rubs, gallops or clicks. No pedal edema. Gastrointestinal system: Abdomen is nondistended, soft and nontender. No organomegaly or masses felt. Normal bowel sounds heard. Central nervous system: Alert and oriented. No focal neurological deficits. Extremities: Symmetric 5 x 5 power. Skin: No rashes, lesions or ulcers Psychiatry: Judgement and insight appear normal.  Mood & affect appropriate.     Data Reviewed: I have personally reviewed following labs and imaging studies  CBC: Recent Labs  Lab 10/23/18 1554 10/24/18 0325 10/25/18 0529 10/26/18 0314 10/27/18 0218 10/28/18 0348  WBC 8.3 5.3 8.8 11.5* 8.5 6.6  NEUTROABS 4.9  --   --   --   --   --   HGB 10.2* 9.5* 9.1* 9.7* 8.4* 7.9*  HCT 35.0* 31.9* 30.4* 33.2* 28.2* 27.4*  MCV 93.6 92.7 92.7 91.7 91.9 94.5  PLT 157 132* 112* 125* PLATELET CLUMPS NOTED ON SMEAR, UNABLE TO ESTIMATE 93*   Basic Metabolic Panel: Recent Labs  Lab 10/23/18 1554 10/24/18 0325 10/25/18 0529 10/26/18 0314 10/27/18 0218 10/28/18 0348  NA 138 138 136 139 142 139  K 4.6 4.7 4.7 3.6 3.8 3.8  CL 115* 117* 116* 116* 119* 116*  CO2 16* 15* 12* 13* 15* 14*  GLUCOSE 87 81 71 98 75 56*  BUN 15 14 22 13 16 16   CREATININE 1.42* 1.62* 1.57* 1.40* 1.25* 1.21*  CALCIUM 8.3* 8.0* 7.8* 8.0* 7.7* 7.7*  MG 2.1  --  1.9  --  1.8  --    GFR: Estimated Creatinine Clearance: 32.1 mL/min (A) (by C-G formula based on SCr of 1.21 mg/dL (H)). Liver Function Tests: Recent Labs  Lab 10/24/18 0325 10/25/18 0529 10/26/18 0314 10/27/18 0218 10/28/18 0348  AST 13* 14* 11* 13* 13*  ALT 10 11 8 9 8   ALKPHOS 166* 153* 149* 129* 115  BILITOT 0.4 0.3 0.6 0.4 0.6  PROT 6.0* 5.9* 5.8* 5.8* 5.4*  ALBUMIN 2.9* 2.9* 3.0* 2.7* 2.4*   Recent Labs  Lab 10/23/18 1554 10/26/18 0314 10/28/18 0348  LIPASE 33 720* 115*  AMYLASE 97  --   --    No results for input(s): AMMONIA in the last 168 hours. Coagulation Profile: Recent Labs  Lab 10/23/18 1554 10/24/18 0325  INR 1.08 1.16   Cardiac Enzymes: No results for input(s): CKTOTAL, CKMB, CKMBINDEX, TROPONINI in the last 168 hours. BNP (last 3 results) No results for input(s): PROBNP in the last 8760 hours. HbA1C: No results for input(s): HGBA1C in the last 72 hours. CBG: No results for input(s): GLUCAP in the last 168 hours. Lipid Profile: No results for input(s): CHOL, HDL,  LDLCALC, TRIG, CHOLHDL, LDLDIRECT in the last 72 hours. Thyroid Function Tests: No results for input(s): TSH, T4TOTAL, FREET4, T3FREE, THYROIDAB in the last 72 hours. Anemia Panel: No results for input(s): VITAMINB12, FOLATE, FERRITIN, TIBC, IRON, RETICCTPCT in the last 72 hours. Sepsis Labs: No results for input(s): PROCALCITON, LATICACIDVEN in the last 168 hours.  Recent Results (from the past 240 hour(s))  Urine culture     Status: None   Collection Time: 10/23/18  8:14 PM  Result Value Ref Range Status   Specimen Description URINE, SUPRAPUBIC  Final   Special Requests   Final    NONE Performed at [facility], [address], [place], [place] [address]    Culture   Final    Multiple bacterial morphotypes present, none predominant. Suggest appropriate recollection if clinically indicated.   Report Status 10/25/2018 FINAL  Final         Radiology Studies: Ct Abdomen Pelvis W Contrast  Result Date: 10/28/2018 CLINICAL DATA:  Right upper quadrant pain. Cholelithiasis and post ERCP on 10/24/2018. Now with posterior CP pancreatitis. Weight loss. EXAM: CT ABDOMEN AND PELVIS WITH CONTRAST TECHNIQUE: Multidetector CT imaging of the abdomen and pelvis was performed using the standard protocol following bolus administration of intravenous contrast. CONTRAST:  153mL OMNIPAQUE IOHEXOL 300 MG/ML  SOLN COMPARISON:  08/17/2018 FINDINGS: Lower chest: Small bilateral pleural effusions with atelectasis in the lung bases. Hepatobiliary: No focal liver lesions. Pneumobilia is likely postprocedural. Increased density in the gallbladder may represent vicarious excretion of contrast material or gallbladder sludge. No gallbladder wall thickening or discrete stones. Pancreas: Swelling of the head of the pancreas with infiltration and edema around the head and body of the pancreas and extending to the anterior pararenal spaces. Changes are consistent with acute pancreatitis. There is a stent in the  pancreatic duct. No pancreatic ductal dilatation. No loculated collections. No evidence of pancreatic necrosis. Spleen: Calcified granulomas in the spleen. Adrenals/Urinary Tract: No adrenal gland nodules. Bilateral renal parenchymal atrophy, greater on the left. Left ureteral stent is in place. Persistent left hydronephrosis possibly due to reflux. Bladder is incompletely distended. Foley catheter is present. Stomach/Bowel: Stomach, small bowel, and colon are not abnormally distended. No bowel wall thickening or inflammatory changes. Contrast material flows through to the rectum without evidence of obstruction. Appendix is not identified. Vascular/Lymphatic: Extensive vascular calcifications of the aorta and branch vessels. Left common iliac artery stent. No significant lymphadenopathy. Mild prominence of retroperitoneal lymph nodes, likely reactive. No pathologic enlargement. Reproductive: Status post hysterectomy. No adnexal masses. Other: No abdominal wall hernia or abnormality. No abdominopelvic ascites. Musculoskeletal: Healing fracture of the right superior and inferior pubic ramus. No acute bony abnormalities. IMPRESSION: 1. Changes of acute pancreatitis with peripancreatic infiltration and edema. Pancreatic duct stent in place. No evidence of pancreatic  necrosis or loculated fluid collection. 2. Small bilateral pleural effusions with basilar atelectasis. 3. Extensive vascular calcifications. 4. Healing fracture of the right superior and inferior pubic ramus. Electronically Signed   By: Lucienne Capers M.D.   On: 10/28/2018 01:43        Scheduled Meds: . amLODipine  2.5 mg Oral QPM  . atorvastatin  10 mg Oral Daily  . carvedilol  6.25 mg Oral BID WC  . donepezil  10 mg Oral QHS  . doxazosin  2 mg Oral Daily  . phenytoin  200 mg Oral QHS  . pneumococcal 23 valent vaccine  0.5 mL Intramuscular Tomorrow-1000  . sodium bicarbonate  650 mg Oral TID  . venlafaxine XR  150 mg Oral Q breakfast    Continuous Infusions: . dextrose 5 % and 0.9% NaCl 100 mL/hr at 10/28/18 1109     LOS: 5 days    Time spent: 30 minutes    Lady Deutscher, MD FACP [facility] Pager [phone]  If 7PM-7AM, please contact night-coverage [email or web address] Password [email or web address] 10/28/2018, 2:29 PM

## 2018-10-29 LAB — IRON AND TIBC
Iron: 36 ug/dL (ref 28–170)
SATURATION RATIOS: 16 % (ref 10.4–31.8)
TIBC: 218 ug/dL — ABNORMAL LOW (ref 250–450)
UIBC: 182 ug/dL

## 2018-10-29 LAB — FERRITIN: FERRITIN: 40 ng/mL (ref 11–307)

## 2018-10-29 MED ORDER — AMLODIPINE BESYLATE 5 MG PO TABS
5.0000 mg | ORAL_TABLET | Freq: Every evening | ORAL | Status: DC
Start: 2018-10-29 — End: 2018-10-30
  Administered 2018-10-29: 5 mg via ORAL
  Filled 2018-10-29: qty 1

## 2018-10-29 NOTE — Care Management Important Message (Signed)
Important Message  Patient Details  Name: [name] MRN: [id number] Date of Birth: 1951/03/30   Medicare Important Message Given:  Yes    Jalie Eiland 10/29/2018, 3:12 PM

## 2018-10-29 NOTE — Progress Notes (Signed)
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 Her course wascomplicatedby post ERCP pancreatitis. She was transferred here for further evaluation and management.  Due to persistent pain a CT scan of the abdomen and pelvis was obtained yesterday.  Fortunately it did not show any necrotizing pancreatitis.  10/29/2018: Patient seen and examined at bedside.  She reports diffused persistent pain in her abdomen improved with pain medications.  Nausea is improving.  Assessment/Plan: Active Problems:   ALZHEIMERS DISEASE   COPD (chronic obstructive pulmonary disease) (HCC)   Seizure (HCC)   HTN (hypertension)   Biliary colic  Choledocholithiasis status post ERCP, improving Transferred from [place] for management CT scan did not show pancreatic necrosis Pain management and bowel regimen in place GI following  Post ERCP pancreatitis Management as stated above CT abdomen and pelvis revealed pancreatitis but without abscess, fluid collection, or necrosis Started on full liquid diet  Uncontrolled hypertension On 2.5 mg with amlodipine, carvedilol 6.25 twice daily, doxazosin 2 mg daily Increase amlodipine to 5 daily Continue to monitor vital signs  Resolving AKI Creatinine on presentation 1.57 with GFR of 33 Creatinine today  1.21 with GFR of 45 Appears to be at her baseline Avoid nephrotoxic agents/dehydration/hypotension  Chronic normocytic anemia Baseline hemoglobin appears to be 9 Hemoglobin dropping to 7.9 No sign of overt bleeding  Neobladder, 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 coli -urine culture from here on 10/23/2018 no growth to date -Continue IV ceftriaxone started 10/23/2018 -Continue doxazosin 2 mg daily  Recent subdural: Patient apparently has had recent subdural with fall. 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  She is had episodes of small bowel bacterial overgrowth.   - Continue sodium bicarbonate 650 mg 3 times daily  Seizure disorder: -Continue phenytoin 20 mg nightly  Dementia/pain/psych: -Continue venlafaxine 150 mg every morning -Continue donepezil 10 mg nightly   Code Status: Full code  Family Communication: None at bedside  Disposition Plan: Home possibly 1 to 2 days   Consultants:  Gastroenterology Dr. Watt Climes  General surgery consult  Procedures: 10/24/2018 ERCP: The major papilla appeared normal. - Choledocholithiasis was found. Complete removal was accomplished by biliary sphincterotomy and balloon extraction. - One plastic stent was placed into the ventral pancreatic duct to decrease the chance of pancreatitis and assist with cannulation. - A biliary sphincterotomy was performed. - The biliary tree was swept.  Antimicrobials:  IV ceftriaxone started 10/23/2018  DVT prophylaxis: Subcu heparin    Objective: Vitals:   10/28/18 1634 10/28/18 2048 10/29/18 0552 10/29/18 1316  BP: (!) 193/64 (!) 184/58 (!) 163/78 (!) 183/56  Pulse: (!) 59 63 70 (!) 58  Resp: 18 16 18 18   Temp: 98.3 F (36.8 C) 99.1 F (37.3 C) 98.3 F (36.8 C) 97.8 F (36.6 C)    TempSrc: Oral Oral  Oral Oral  SpO2: 99% 98% 99% 100%  Weight:      Height:        Intake/Output Summary (Last 24 hours) at 10/29/2018 1458 Last data filed at 10/29/2018 1316 Gross per 24 hour  Intake 1797.83 ml  Output 1500 ml  Net 297.83 ml   Filed Weights   10/23/18 1343  Weight: 45.1 kg    Exam:  . General: 67 y.o. year-old female well developed well nourished in no acute distress.  Alert and interactive. . Cardiovascular: Regular rate and rhythm with no rubs or gallops.  No thyromegaly or JVD noted.   Marland Kitchen Respiratory: Clear to auscultation with no wheezes or rales. Good inspiratory effort. . Abdomen: Diffusely tender on palpation.  Hypoactive bowel sounds x4.   . Musculoskeletal: No lower extremity edema. 2/4 pulses in all 4 extremities. . Skin: No ulcerative lesions noted or rashes, . Psychiatry: Mood is appropriate for condition and setting   Data Reviewed: CBC: Recent Labs  Lab 10/23/18 1554 10/24/18 0325 10/25/18 0529 10/26/18 0314 10/27/18 0218 10/28/18 0348  WBC 8.3 5.3 8.8 11.5* 8.5 6.6  NEUTROABS 4.9  --   --   --   --   --   HGB 10.2* 9.5* 9.1* 9.7* 8.4* 7.9*  HCT 35.0* 31.9* 30.4* 33.2* 28.2* 27.4*  MCV 93.6 92.7 92.7 91.7 91.9 94.5  PLT 157 132* 112* 125* PLATELET CLUMPS NOTED ON SMEAR, UNABLE TO ESTIMATE 93*   Basic Metabolic Panel: Recent Labs  Lab 10/23/18 1554 10/24/18 0325 10/25/18 0529 10/26/18 0314 10/27/18 0218 10/28/18 0348  NA 138 138 136 139 142 139  K 4.6 4.7 4.7 3.6 3.8 3.8  CL 115* 117* 116* 116* 119* 116*  CO2 16* 15* 12* 13* 15* 14*  GLUCOSE 87 81 71 98 75 56*  BUN 15 14 22 13 16 16   CREATININE 1.42* 1.62* 1.57* 1.40* 1.25* 1.21*  CALCIUM 8.3* 8.0* 7.8* 8.0* 7.7* 7.7*  MG 2.1  --  1.9  --  1.8  --    GFR: Estimated Creatinine Clearance: 32.1 mL/min (A) (by C-G formula based on SCr of 1.21 mg/dL (H)). Liver Function Tests: Recent Labs  Lab 10/24/18 0325 10/25/18 0529 10/26/18 0314 10/27/18 0218 10/28/18 0348  AST  13* 14* 11* 13* 13*  ALT 10 11 8 9 8   ALKPHOS 166* 153* 149* 129* 115  BILITOT 0.4 0.3 0.6 0.4 0.6  PROT 6.0* 5.9* 5.8* 5.8* 5.4*  ALBUMIN 2.9* 2.9* 3.0* 2.7* 2.4*   Recent Labs  Lab 10/23/18 1554 10/26/18 0314 10/28/18 0348  LIPASE 33 720* 115*  AMYLASE 97  --   --    No results for input(s): AMMONIA in the last 168 hours. Coagulation Profile: Recent Labs  Lab 10/23/18 1554 10/24/18 0325  INR 1.08 1.16   Cardiac Enzymes: No results for input(s): CKTOTAL, CKMB, CKMBINDEX, TROPONINI in the last 168 hours. BNP (last 3 results) No results for input(s): PROBNP in the last 8760 hours. HbA1C: No results for input(s): HGBA1C in the last 72 hours. CBG: No results for input(s): GLUCAP in the last 168 hours. Lipid Profile: No results for input(s): CHOL, HDL, LDLCALC, TRIG, CHOLHDL, LDLDIRECT in the last 72 hours. Thyroid Function Tests: No results for input(s): TSH, T4TOTAL, FREET4, T3FREE, THYROIDAB in the last 72 hours. Anemia Panel: No results for input(s): VITAMINB12, FOLATE, FERRITIN, TIBC, IRON, RETICCTPCT in the last 72 hours. Urine analysis:    Component Value Date/Time   COLORURINE YELLOW 10/23/2018 2048   APPEARANCEUR HAZY (  A) 10/23/2018 2048   LABSPEC 1.012 10/23/2018 2048   PHURINE 6.0 10/23/2018 2048   GLUCOSEU NEGATIVE 10/23/2018 2048   HGBUR SMALL (A) 10/23/2018 2048   BILIRUBINUR NEGATIVE 10/23/2018 2048   KETONESUR NEGATIVE 10/23/2018 2048   PROTEINUR 30 (A) 10/23/2018 2048   UROBILINOGEN 0.2 01/24/2014 0500   NITRITE NEGATIVE 10/23/2018 2048   LEUKOCYTESUR LARGE (A) 10/23/2018 2048   Sepsis Labs: @LABRCNTIP (procalcitonin:4,lacticidven:4)  ) Recent Results (from the past 240 hour(s))  Urine culture     Status: None   Collection Time: 10/23/18  8:14 PM  Result Value Ref Range Status   Specimen Description URINE, SUPRAPUBIC  Final   Special Requests   Final    NONE Performed at [facility], [place] [address], [place], [place] [address]     Culture   Final    Multiple bacterial morphotypes present, none predominant. Suggest appropriate recollection if clinically indicated.   Report Status 10/25/2018 FINAL  Final      Studies: No results found.  Scheduled Meds: . amLODipine  2.5 mg Oral QPM  . atorvastatin  10 mg Oral Daily  . carvedilol  6.25 mg Oral BID WC  . donepezil  10 mg Oral QHS  . doxazosin  2 mg Oral Daily  . phenytoin  200 mg Oral QHS  . pneumococcal 23 valent vaccine  0.5 mL Intramuscular Tomorrow-1000  . sodium bicarbonate  650 mg Oral TID  . venlafaxine XR  150 mg Oral Q breakfast    Continuous Infusions: . dextrose 5 % and 0.9% NaCl 100 mL/hr at 10/28/18 2123     LOS: 6 days     Kayleen Memos, MD [facility] Pager [phone]  If 7PM-7AM, please contact night-coverage [email or web address] Password [facility] 10/29/2018, 2:58 PM

## 2018-10-29 NOTE — Progress Notes (Signed)
Subjective: Tolerating liquid diet. Abdominal pain slowly improving.  Objective: Vital signs in last 24 hours: Temp:  [97.8 F (36.6 C)-99.1 F (37.3 C)] 97.8 F (36.6 C) (11/20 1316) Pulse Rate:  [58-70] 58 (11/20 1316) Resp:  [16-18] 18 (11/20 1316) BP: (163-193)/(56-78) 183/56 (11/20 1316) SpO2:  [98 %-100 %] 100 % (11/20 1316) Weight change:  Last BM Date: 10/28/18  PE: GEN:  NAD ABD:  Soft, mild epigastric tenderness  Lab Results:   Assessment:  1. Choledocholithiasis, s/p ERCP this admission with stone extraction. 2. Post-ERCP pancreatitis. 3. Improving, but not resolved, abdominal pain, which patient tells me started after her ERCP, and is not same as symptoms pre-ERCP. No fevers or leukocytosis but has voluntary guarding.  CT couple days ago with pancreatitis but without abscess/fluid collection/necrosis  Plan:  1.  Trial full liquid diet. 2.  Mobilize patient, ambulate, OOBTC, as tolerated. 3.  IV fluids until oral intake improves. 4.  [facility] will follow.    Landry Dyke 10/29/2018, 2:04 PM   Cell [phone] If no answer or after 5 PM call [phone]

## 2018-10-30 ENCOUNTER — Inpatient Hospital Stay (HOSPITAL_COMMUNITY): Payer: Medicare HMO

## 2018-10-30 LAB — CBC
HCT: 29 % — ABNORMAL LOW (ref 36.0–46.0)
Hemoglobin: 8.8 g/dL — ABNORMAL LOW (ref 12.0–15.0)
MCH: 27.7 pg (ref 26.0–34.0)
MCHC: 30.3 g/dL (ref 30.0–36.0)
MCV: 91.2 fL (ref 80.0–100.0)
Platelets: 106 10*3/uL — ABNORMAL LOW (ref 150–400)
RBC: 3.18 MIL/uL — AB (ref 3.87–5.11)
RDW: 15 % (ref 11.5–15.5)
WBC: 4.3 10*3/uL (ref 4.0–10.5)
nRBC: 0 % (ref 0.0–0.2)

## 2018-10-30 LAB — BASIC METABOLIC PANEL
Anion gap: 5 (ref 5–15)
CHLORIDE: 119 mmol/L — AB (ref 98–111)
CO2: 19 mmol/L — ABNORMAL LOW (ref 22–32)
Calcium: 7.6 mg/dL — ABNORMAL LOW (ref 8.9–10.3)
Creatinine, Ser: 1.06 mg/dL — ABNORMAL HIGH (ref 0.44–1.00)
GFR calc Af Amer: >60 mL/min (ref >60)
GFR calc non Af Amer: 53 mL/min — ABNORMAL LOW (ref >60)
Glucose, Bld: 111 mg/dL — ABNORMAL HIGH (ref 70–99)
POTASSIUM: 2.9 mmol/L — AB (ref 3.5–5.1)
Sodium: 143 mmol/L (ref 135–145)

## 2018-10-30 LAB — LIPASE, BLOOD: Lipase: 60 U/L — ABNORMAL HIGH (ref 11–51)

## 2018-10-30 MED ORDER — AMLODIPINE BESYLATE 10 MG PO TABS
10.0000 mg | ORAL_TABLET | Freq: Every evening | ORAL | Status: DC
  Administered 2018-10-30 – 2018-10-31 (×2): 10 mg via ORAL
  Filled 2018-10-30 (×2): qty 1

## 2018-10-30 MED ORDER — FERROUS SULFATE 325 (65 FE) MG PO TABS
325.0000 mg | ORAL_TABLET | Freq: Two times a day (BID) | ORAL | Status: DC
  Administered 2018-10-30: 325 mg via ORAL
  Filled 2018-10-30: qty 1

## 2018-10-30 MED ORDER — FERROUS SULFATE 325 (65 FE) MG PO TABS
325.0000 mg | ORAL_TABLET | Freq: Two times a day (BID) | ORAL | Status: DC
  Administered 2018-10-31 (×2): 325 mg via ORAL
  Filled 2018-10-30 (×2): qty 1

## 2018-10-30 MED ORDER — NICOTINE 21 MG/24HR TD PT24
21.0000 mg | MEDICATED_PATCH | Freq: Every day | TRANSDERMAL | Status: DC
  Administered 2018-10-30 – 2018-10-31 (×2): 21 mg via TRANSDERMAL
  Filled 2018-10-30 (×2): qty 1

## 2018-10-30 MED ORDER — POTASSIUM CHLORIDE CRYS ER 20 MEQ PO TBCR
40.0000 meq | EXTENDED_RELEASE_TABLET | Freq: Three times a day (TID) | ORAL | Status: AC
  Administered 2018-10-30 – 2018-10-31 (×3): 40 meq via ORAL
  Filled 2018-10-30 (×3): qty 2

## 2018-10-30 MED ORDER — FERROUS SULFATE 325 (65 FE) MG PO TABS
325.0000 mg | ORAL_TABLET | Freq: Every day | ORAL | Status: DC
  Administered 2018-10-30: 325 mg via ORAL
  Filled 2018-10-30: qty 1

## 2018-10-30 NOTE — Progress Notes (Signed)
Subjective: Abdominal pain improving.  Objective: Vital signs in last 24 hours: Temp:  [98.2 F (36.8 C)-98.5 F (36.9 C)] 98.2 F (36.8 C) (11/21 1322) Pulse Rate:  [61-64] 64 (11/21 1322) Resp:  [14-18] 18 (11/21 0548) BP: (153-181)/(63-80) 153/63 (11/21 1322) SpO2:  [98 %-100 %] 100 % (11/21 1322) Weight change:  Last BM Date: 10/29/18  PE: GEN:  NAD ABD:  Soft, mild epigastric tenderness (much improved)  Lab Results: CBC    Component Value Date/Time   WBC 4.3 10/30/2018 0745   RBC 3.18 (L) 10/30/2018 0745   HGB 8.8 (L) 10/30/2018 0745   HGB 11.0 (L) 10/13/2018 1127   HCT 29.0 (L) 10/30/2018 0745   HCT 35.6 10/13/2018 1127   PLT 106 (L) 10/30/2018 0745   PLT 204 10/13/2018 1127   MCV 91.2 10/30/2018 0745   MCV 87 10/13/2018 1127   MCH 27.7 10/30/2018 0745   MCHC 30.3 10/30/2018 0745   RDW 15.0 10/30/2018 0745   RDW 13.6 10/13/2018 1127   LYMPHSABS 2.5 10/23/2018 1554   LYMPHSABS 2.3 10/13/2018 1127   MONOABS 0.8 10/23/2018 1554   EOSABS 0.1 10/23/2018 1554   EOSABS 0.2 10/13/2018 1127   BASOSABS 0.1 10/23/2018 1554   BASOSABS 0.1 10/13/2018 1127   CMP     Component Value Date/Time   NA 143 10/30/2018 0745   NA 138 10/13/2018 1127   K 2.9 (L) 10/30/2018 0745   CL 119 (H) 10/30/2018 0745   CO2 19 (L) 10/30/2018 0745   GLUCOSE 111 (H) 10/30/2018 0745   BUN <5 (L) 10/30/2018 0745   BUN 15 10/13/2018 1127   CREATININE 1.06 (H) 10/30/2018 0745   CALCIUM 7.6 (L) 10/30/2018 0745   PROT 5.4 (L) 10/28/2018 0348   PROT 7.1 10/13/2018 1127   ALBUMIN 2.4 (L) 10/28/2018 0348   ALBUMIN 4.1 10/13/2018 1127   AST 13 (L) 10/28/2018 0348   ALT 8 10/28/2018 0348   ALKPHOS 115 10/28/2018 0348   BILITOT 0.6 10/28/2018 0348   BILITOT <0.2 10/13/2018 1127   GFRNONAA 53 (L) 10/30/2018 0745   GFRAA >60 10/30/2018 0745    Assessment:  1. Choledocholithiasis, s/p ERCP this admission with stone extraction. 2. Post-ERCP pancreatitis. 3.  Abdominal pain, much  improved.  Plan:  1.  Full liquid diet, advance as tolerated. 2.  OOBTC, mobilize, hopefully home in a couple more days. 3.  [facility] will follow.   Landry Dyke 10/30/2018, 1:24 PM   Cell [phone] If no answer or after 5 PM call [phone]

## 2018-10-30 NOTE — Progress Notes (Addendum)
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 Her course wascomplicatedby post ERCP pancreatitis. She was transferred here for further evaluation and management.  Due to persistent pain a CT scan of the abdomen and pelvis was obtained yesterday.  Fortunately it did not show any necrotizing pancreatitis.  10/29/2018:  She reports diffused persistent pain in her abdomen improved with pain medications.  Nausea is improving.  10/30/2018: Patient states abdominal pain meds is better but persistent.  Lipase is trending down.  Assessment/Plan: Active Problems:   ALZHEIMERS DISEASE   COPD (chronic obstructive pulmonary disease) (HCC)   Seizure (HCC)   HTN (hypertension)   Biliary colic  Choledocholithiasis status post ERCP, improving Transferred from [place] for management CT scan did not show pancreatic necrosis Pain management and bowel regimen in place GI following  Post ERCP pancreatitis Management as stated above CT abdomen and pelvis revealed pancreatitis but without abscess, fluid collection, or necrosis Continue with diet as recommended by general surgery Lipase continues to trend down to 60 from 110  Uncontrolled hypertension, improving Increased amlodipine from 2.5 mg daily to 10 mg daily Continue carvedilol  6.25 twice daily Continue doxazosin 2 mg daily  Hypokalemia Potassium 2.9 Repleted Replete as indicated  Resolving AKI Creatinine on presentation 1.57 with GFR of 33 Creatinine today 1.06 from 1.21  Appears to be at her baseline Avoid nephrotoxic agents/dehydration/hypotension  Chronic normocytic anemia/iron deficiency anemia Baseline hemoglobin appears to be 9 Hemoglobin dropping to 7.9 No sign of overt bleeding\ Ferritin 40 and trans-sat 16 Start ferrous sulfate 325 mg twice daily  Neobladder, 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 coli -urine culture from here on 10/23/2018 no growth to date -Completed IV ceftriaxone started 10/23/2018 -Continue doxazosin 2 mg daily  Recent subdural: Patient apparently has had recent subdural with fall. 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  She is had episodes of small bowel bacterial overgrowth.   - Continue sodium bicarbonate 650 mg 3 times daily  Seizure disorder: -Continue phenytoin 20 mg nightly  Dementia/pain/psych: -Continue venlafaxine 150 mg every morning -Continue donepezil 10 mg nightly  Physical debility PT to assess Out of bed to chair as tolerated  Chronic tobacco use disorder Nicotine patch   Code Status: Full code  Family Communication: None at bedside  Disposition Plan: Home possibly tomorrow 10/31/2018   Consultants:  Gastroenterology Dr. Watt Climes  General surgery consult  Procedures: 10/24/2018 ERCP: The major papilla appeared normal. - Choledocholithiasis was found. Complete removal was accomplished by biliary sphincterotomy and balloon extraction. - One plastic stent was placed into the ventral pancreatic duct to decrease the chance of pancreatitis and assist with cannulation. - A biliary  sphincterotomy was performed. - The biliary tree was swept.  Antimicrobials:  IV ceftriaxone started 10/23/2018  DVT prophylaxis: Subcu heparin  Objective: Vitals:   10/30/18 0140 10/30/18 0548 10/30/18 0600 10/30/18 1322  BP: (!) 177/65 (!) 181/80 (!) 180/80 (!) 153/63  Pulse:  61  64  Resp:  18    Temp:  98.5 F (36.9 C)  98.2 F (36.8 C)  TempSrc:  Oral  Oral  SpO2:  99%  100%  Weight:      Height:        Intake/Output Summary (Last 24 hours) at 10/30/2018 1328 Last data filed at 10/30/2018 1700 Gross per 24 hour  Intake 2752.82 ml  Output 3000 ml  Net -247.18 ml   Filed Weights   10/23/18 1343  Weight: 45.1 kg    Exam:  . General: 67 y.o. year-old female well-developed well-nourished in no acute distress.  Alert and interactive.   . Cardiovascular: Rhythm with no rubs or gallops.  No JVD or thyromegaly noted.  Respiratory: Clear to auscultation with no wheezes or rales.  Good inspiratory effort.. . Abdomen: Diffusely tender on palpation.  Hypoactive bowel sounds x4.   . Musculoskeletal: No lower extremity edema. 2/4 pulses in all 4 extremities. . Skin: No ulcerative lesions noted or rashes, . Psychiatry: Mood is appropriate for condition and setting   Data Reviewed: CBC: Recent Labs  Lab 10/23/18 1554  10/25/18 0529 10/26/18 0314 10/27/18 0218 10/28/18 0348 10/30/18 0745  WBC 8.3   < > 8.8 11.5* 8.5 6.6 4.3  NEUTROABS 4.9  --   --   --   --   --   --   HGB 10.2*   < > 9.1* 9.7* 8.4* 7.9* 8.8*  HCT 35.0*   < > 30.4* 33.2* 28.2* 27.4* 29.0*  MCV 93.6   < > 92.7 91.7 91.9 94.5 91.2  PLT 157   < > 112* 125* PLATELET CLUMPS NOTED ON SMEAR, UNABLE TO ESTIMATE 93* 106*   < > = values in this interval not displayed.   Basic Metabolic Panel: Recent Labs  Lab 10/23/18 1554  10/25/18 0529 10/26/18 0314 10/27/18 0218 10/28/18 0348 10/30/18 0745  NA 138   < > 136 139 142 139 143  K 4.6   < > 4.7 3.6 3.8 3.8 2.9*  CL 115*   < > 116* 116* 119* 116*  119*  CO2 16*   < > 12* 13* 15* 14* 19*  GLUCOSE 87   < > 71 98 75 56* 111*  BUN 15   < > 22 13 16 16  <5*  CREATININE 1.42*   < > 1.57* 1.40* 1.25* 1.21* 1.06*  CALCIUM 8.3*   < > 7.8* 8.0* 7.7* 7.7* 7.6*  MG 2.1  --  1.9  --  1.8  --   --    < > = values in this interval not displayed.   GFR: Estimated Creatinine Clearance: 36.7 mL/min (A) (by C-G formula based on SCr of 1.06 mg/dL (H)). Liver Function Tests: Recent Labs  Lab 10/24/18 0325 10/25/18 0529 10/26/18 0314 10/27/18 0218 10/28/18 0348  AST 13* 14* 11* 13* 13*  ALT 10 11 8 9 8   ALKPHOS 166* 153* 149* 129* 115  BILITOT 0.4 0.3 0.6 0.4 0.6  PROT 6.0* 5.9* 5.8* 5.8* 5.4*  ALBUMIN 2.9* 2.9* 3.0* 2.7* 2.4*   Recent Labs  Lab 10/23/18 1554 10/26/18 0314 10/28/18 0348 10/30/18 0745  LIPASE 33 720* 115* 60*  AMYLASE 97  --   --   --    No results for input(s): AMMONIA in the last 168 hours. Coagulation Profile:  Recent Labs  Lab 10/23/18 1554 10/24/18 0325  INR 1.08 1.16   Cardiac Enzymes: No results for input(s): CKTOTAL, CKMB, CKMBINDEX, TROPONINI in the last 168 hours. BNP (last 3 results) No results for input(s): PROBNP in the last 8760 hours. HbA1C: No results for input(s): HGBA1C in the last 72 hours. CBG: No results for input(s): GLUCAP in the last 168 hours. Lipid Profile: No results for input(s): CHOL, HDL, LDLCALC, TRIG, CHOLHDL, LDLDIRECT in the last 72 hours. Thyroid Function Tests: No results for input(s): TSH, T4TOTAL, FREET4, T3FREE, THYROIDAB in the last 72 hours. Anemia Panel: Recent Labs    10/29/18 1532  FERRITIN 40  TIBC 218*  IRON 36   Urine analysis:    Component Value Date/Time   COLORURINE YELLOW 10/23/2018 2048   APPEARANCEUR HAZY (A) 10/23/2018 2048   LABSPEC 1.012 10/23/2018 2048   PHURINE 6.0 10/23/2018 2048   GLUCOSEU NEGATIVE 10/23/2018 2048   HGBUR SMALL (A) 10/23/2018 2048   BILIRUBINUR NEGATIVE 10/23/2018 2048   KETONESUR NEGATIVE 10/23/2018 2048   PROTEINUR  30 (A) 10/23/2018 2048   UROBILINOGEN 0.2 01/24/2014 0500   NITRITE NEGATIVE 10/23/2018 2048   LEUKOCYTESUR LARGE (A) 10/23/2018 2048   Sepsis Labs: @LABRCNTIP (procalcitonin:4,lacticidven:4)  ) Recent Results (from the past 240 hour(s))  Urine culture     Status: None   Collection Time: 10/23/18  8:14 PM  Result Value Ref Range Status   Specimen Description URINE, SUPRAPUBIC  Final   Special Requests   Final    NONE Performed at [facility], [place] [address] [place], [place] [address]    Culture   Final    Multiple bacterial morphotypes present, none predominant. Suggest appropriate recollection if clinically indicated.   Report Status 10/25/2018 FINAL  Final      Studies: No results found.  Scheduled Meds: . amLODipine  10 mg Oral QPM  . atorvastatin  10 mg Oral Daily  . carvedilol  6.25 mg Oral BID WC  . donepezil  10 mg Oral QHS  . doxazosin  2 mg Oral Daily  . ferrous sulfate  325 mg Oral Q breakfast  . nicotine  21 mg Transdermal Daily  . phenytoin  200 mg Oral QHS  . potassium chloride  40 mEq Oral TID  . sodium bicarbonate  650 mg Oral TID  . venlafaxine XR  150 mg Oral Q breakfast    Continuous Infusions: . dextrose 5 % and 0.9% NaCl 20 mL/hr at 10/30/18 2505     LOS: 7 days     Kayleen Memos, MD [facility] Pager [phone]  If 7PM-7AM, please contact night-coverage [email or web address] Password [email or web address] 10/30/2018, 1:28 PM

## 2018-10-30 NOTE — Progress Notes (Signed)
Notified MD on call of pt's Bp, 181/80, pt asymptomatic. Awaiting for orders.

## 2018-10-30 NOTE — Evaluation (Signed)
Physical Therapy Evaluation Patient Details Name: [name] MRN: [id number] DOB: 1951/02/28 Today's Date: 10/30/2018   History of Present Illness  Patient is a 67 y/o female presenting to the ED on 10/23/18 due to R upper quadrant pain. At the [facility] emergency room patient was noted to have a 7 mm stone in the common bile duct. 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 ERCP on 10/24/18 with Post-ERCP pancreatitis.    Clinical Impression  [name] is a very pleasant 67 y/o female admitted with the above listed diagnosis. Patient reports that prior to admission she lived with her son who assisted with household chores, but otherwise was Mod I with mobility with rollator. Patient today requiring overall for safety, however no physical assist provided. Will recommend HHPT at discharge to ensure safe and independent functional mobility in the home environment. PT to follow acutely.     Follow Up Recommendations Home health PT    Equipment Recommendations  Rolling walker with 5" wheels(patient has rollator - would prefer a RW)    Recommendations for Other Services       Precautions / Restrictions Precautions Precautions: Fall Restrictions Weight Bearing Restrictions: No      Mobility  Bed Mobility Overal bed mobility: Modified Independent                Transfers Overall transfer level: Needs assistance Equipment used: Rolling walker (2 wheeled) Transfers: Sit to/from Stand Sit to Stand: Min guard         General transfer comment: for immediate standing balance  Ambulation/Gait Ambulation/Gait assistance: Min guard;Supervision Gait Distance (Feet): 150 Feet Assistive device: Rolling walker (2 wheeled) Gait Pattern/deviations: Step-through pattern;Decreased stride length Gait velocity: decreased    General Gait Details: very slow pace of gait  Stairs            Wheelchair Mobility    Modified Rankin (Stroke Patients Only)       Balance Overall balance assessment: Mild deficits observed, not formally tested                                           Pertinent Vitals/Pain Pain Assessment: Faces Faces Pain Scale: Hurts little more Pain Location: stomach Pain Descriptors / Indicators: Aching;Discomfort;Guarding Pain Intervention(s): Limited activity within patient's tolerance;Monitored during session    Home Living Family/patient expects to be discharged to:: Private residence Living Arrangements: Children Available Help at Discharge: Family;Available 24 hours/day Type of Home: House Home Access: Stairs to enter Entrance Stairs-Rails: Right Entrance Stairs-Number of Steps: 3 Home Layout: One level Home Equipment: Cane - single point;Walker - 4 wheels;Shower seat Additional Comments: has a rollator - would prefer a RW    Prior Function Level of Independence: Needs assistance   Gait / Transfers Assistance Needed: use of rollator  ADL's / Homemaking Assistance Needed: reports son does her cooking        Hand Dominance        Extremity/Trunk Assessment   Upper Extremity Assessment Upper Extremity Assessment: Defer to OT evaluation    Lower Extremity Assessment Lower Extremity Assessment: Generalized weakness    Cervical / Trunk Assessment Cervical / Trunk Assessment: Normal  Communication   Communication: No difficulties  Cognition Arousal/Alertness: Awake/alert Behavior During Therapy: WFL for  tasks assessed/performed Overall Cognitive Status: Within Functional Limits for tasks assessed                                        General Comments      Exercises     Assessment/Plan    PT Assessment Patient needs continued PT services  PT Problem List Decreased strength;Decreased activity tolerance;Decreased  balance;Decreased mobility;Decreased safety awareness       PT Treatment Interventions DME instruction;Gait training;Stair training;Functional mobility training;Therapeutic activities;Therapeutic exercise;Balance training;Patient/family education    PT Goals (Current goals can be found in the Care Plan section)  Acute Rehab PT Goals Patient Stated Goal: return home PT Goal Formulation: With patient Time For Goal Achievement: 11/13/18 Potential to Achieve Goals: Good    Frequency Min 3X/week   Barriers to discharge        Co-evaluation               AM-PAC PT "6 Clicks" Daily Activity  Outcome Measure Difficulty turning over in bed (including adjusting bedclothes, sheets and blankets)?: A Little Difficulty moving from lying on back to sitting on the side of the bed? : A Little Difficulty sitting down on and standing up from a chair with arms (e.g., wheelchair, bedside commode, etc,.)?: Unable Help needed moving to and from a bed to chair (including a wheelchair)?: A Little Help needed walking in hospital room?: A Little Help needed climbing 3-5 steps with a railing? : A Lot 6 Click Score: 15    End of Session Equipment Utilized During Treatment: Gait belt Activity Tolerance: Patient tolerated treatment well Patient left: in chair;with call bell/phone within reach Nurse Communication: Mobility status PT Visit Diagnosis: Unsteadiness on feet (R26.81);Other abnormalities of gait and mobility (R26.89)    Time: 9798-9211 PT Time Calculation (min) (ACUTE ONLY): 23 min   Charges:   PT Evaluation $PT Eval Moderate Complexity: 1 Mod PT Treatments $Gait Training: 8-22 mins        Lanney Gins, PT, DPT Supplemental Physical Therapist 10/30/18 3:19 PM Pager: [phone] Office: [phone]

## 2018-10-31 LAB — CBC WITH DIFFERENTIAL/PLATELET
Abs Immature Granulocytes: 0.02 10*3/uL (ref 0.00–0.07)
Basophils Absolute: 0 10*3/uL (ref 0.0–0.1)
Basophils Relative: 0 %
EOS ABS: 0.1 10*3/uL (ref 0.0–0.5)
Eosinophils Relative: 2 %
HEMATOCRIT: 27.7 % — AB (ref 36.0–46.0)
HEMOGLOBIN: 8.1 g/dL — AB (ref 12.0–15.0)
Immature Granulocytes: 0 %
LYMPHS ABS: 1.1 10*3/uL (ref 0.7–4.0)
LYMPHS PCT: 24 %
MCH: 27.2 pg (ref 26.0–34.0)
MCHC: 29.2 g/dL — ABNORMAL LOW (ref 30.0–36.0)
MCV: 93 fL (ref 80.0–100.0)
Monocytes Absolute: 0.6 10*3/uL (ref 0.1–1.0)
Monocytes Relative: 13 %
NRBC: 0 % (ref 0.0–0.2)
Neutro Abs: 2.8 10*3/uL (ref 1.7–7.7)
Neutrophils Relative %: 61 %
Platelets: 92 10*3/uL — ABNORMAL LOW (ref 150–400)
RBC: 2.98 MIL/uL — AB (ref 3.87–5.11)
RDW: 15.2 % (ref 11.5–15.5)
WBC: 4.6 10*3/uL (ref 4.0–10.5)

## 2018-10-31 LAB — BASIC METABOLIC PANEL
ANION GAP: 4 — AB (ref 5–15)
BUN: <5 mg/dL — ABNORMAL LOW (ref 8–23)
CO2: 19 mmol/L — AB (ref 22–32)
Calcium: 7.5 mg/dL — ABNORMAL LOW (ref 8.9–10.3)
Chloride: 118 mmol/L — ABNORMAL HIGH (ref 98–111)
Creatinine, Ser: 1.07 mg/dL — ABNORMAL HIGH (ref 0.44–1.00)
GFR calc Af Amer: >60 mL/min (ref >60)
GFR calc non Af Amer: 52 mL/min — ABNORMAL LOW (ref >60)
GLUCOSE: 108 mg/dL — AB (ref 70–99)
POTASSIUM: 3.8 mmol/L (ref 3.5–5.1)
Sodium: 141 mmol/L (ref 135–145)

## 2018-10-31 LAB — LIPASE, BLOOD: Lipase: 45 U/L (ref 11–51)

## 2018-10-31 MED ORDER — SODIUM BICARBONATE 650 MG PO TABS: 650.0000 mg | ORAL_TABLET | Freq: Three times a day (TID) | ORAL | 0 refills | Status: AC

## 2018-10-31 MED ORDER — FERROUS SULFATE 325 (65 FE) MG PO TABS: 325.0000 mg | ORAL_TABLET | Freq: Two times a day (BID) | ORAL | 0 refills | Status: DC

## 2018-10-31 MED ORDER — HYDROCHLOROTHIAZIDE 12.5 MG PO CAPS
12.5000 mg | ORAL_CAPSULE | Freq: Every day | ORAL | Status: DC
  Administered 2018-10-31: 12.5 mg via ORAL
  Filled 2018-10-31: qty 1

## 2018-10-31 MED ORDER — DICLOFENAC SODIUM 1 % TD GEL: 2.0000 g | Freq: Three times a day (TID) | TRANSDERMAL | 0 refills | Status: DC

## 2018-10-31 MED ORDER — OXYCODONE HCL 5 MG PO TABS: 2.5000 mg | ORAL_TABLET | Freq: Three times a day (TID) | ORAL | 0 refills | Status: DC | PRN

## 2018-10-31 MED ORDER — HYDRALAZINE HCL 20 MG/ML IJ SOLN
10.0000 mg | Freq: Once | INTRAMUSCULAR | Status: AC
  Administered 2018-10-31: 10 mg via INTRAVENOUS
  Filled 2018-10-31: qty 1

## 2018-10-31 MED ORDER — HYDROCHLOROTHIAZIDE 12.5 MG PO CAPS: 12.5000 mg | ORAL_CAPSULE | Freq: Every day | ORAL | 0 refills | Status: DC

## 2018-10-31 MED ORDER — NICOTINE 21 MG/24HR TD PT24: 21.0000 mg | MEDICATED_PATCH | Freq: Every day | TRANSDERMAL | 0 refills | Status: DC

## 2018-10-31 MED ORDER — ATORVASTATIN CALCIUM 10 MG PO TABS: 10.0000 mg | ORAL_TABLET | Freq: Every day | ORAL | 0 refills | Status: DC

## 2018-10-31 MED ORDER — DICLOFENAC SODIUM 1 % TD GEL
2.0000 g | Freq: Three times a day (TID) | TRANSDERMAL | Status: DC
  Administered 2018-10-31 (×2): 2 g via TOPICAL
  Filled 2018-10-31: qty 100

## 2018-10-31 MED ORDER — AMLODIPINE BESYLATE 10 MG PO TABS: 10.0000 mg | ORAL_TABLET | Freq: Every evening | ORAL | 0 refills | Status: DC

## 2018-10-31 NOTE — Care Management Note (Addendum)
Case Management Note  Patient Details  Name: SIDRA OLDFIELD MRN: [id number] Date of Birth: [birth year]  Subjective/Objective:                    Action/Plan:Orders and discharge summary faxed to [facility]   Patient has used [facility] phone [phone] fax [phone] and would like then again. Spoke with Tillie Rung at [facility] , referral accepted. Once orders signed will fax. Discharge summary will need to be faxed also.  Ordered rolling walker through Reggie with [facility], he will bring to patient's room today.  Expected Discharge Date:  10/19/18               Expected Discharge Plan:  [place]  In-House Referral:     Discharge planning Services  CM Consult  Post Acute Care Choice:  Home Health, Durable Medical Equipment Choice offered to:  Patient  DME Arranged:  Walker rolling DME Agency:  [place]:  PT [place]:  [facility]  Status of Service:  In process, will continue to follow  If discussed at Long Length of Stay Meetings, dates discussed:    Additional Comments:  Marilu Favre, RN 10/31/2018, 10:56 AM

## 2018-10-31 NOTE — Progress Notes (Signed)
Physical Therapy Treatment Patient Details Name: [name] MRN: [id number] DOB: 1951/02/15 Today's Date: 10/31/2018    History of Present Illness Patient is a 67 y/o female presenting to the ED on 10/23/18 due to R upper quadrant pain. At the [facility] emergency room patient was noted to have a 7 mm stone in the common bile duct. 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 ERCP on 10/24/18 with Post-ERCP pancreatitis.    PT Comments    Patient seen for mobility progression.  Pt requires supervision/min guard for gait training and min A for stair training. Pt c/o dizziness upon descending steps and brief seated rest break required until dizziness subsided. SpO2 and HR WNL. Pt able to ambulate back to room with min guard for safety. Current plan remains appropriate.   Follow Up Recommendations  Home health PT     Equipment Recommendations  Rolling walker with 5" wheels(RW delivered to room 11/22)    Recommendations for Other Services       Precautions / Restrictions Precautions Precautions: Fall    Mobility  Bed Mobility Overal bed mobility: Modified Independent                Transfers Overall transfer level: Needs assistance Equipment used: Rolling walker (2 wheeled) Transfers: Sit to/from Stand Sit to Stand: Min guard         General transfer comment: min guard for safety; cues for safe hand placement  Ambulation/Gait Ambulation/Gait assistance: Min guard Gait Distance (Feet): 200 Feet(with seated rest break) Assistive device: Rolling walker (2 wheeled) Gait Pattern/deviations: Step-through pattern;Decreased stride length Gait velocity: decreased   General Gait Details: cues for cadence and stride length   Stairs Stairs: Yes Stairs assistance: Min assist Stair Management: One rail  Right;Step to pattern;Forwards Number of Stairs: 4 General stair comments: HHA on R and rail on L to ascend; pt has bilat hand rails and can reach both at same time at home   Wheelchair Mobility    Modified Rankin (Stroke Patients Only)       Balance Overall balance assessment: Mild deficits observed, not formally tested                                          Cognition Arousal/Alertness: Awake/alert Behavior During Therapy: WFL for tasks assessed/performed Overall Cognitive Status: Within Functional Limits for tasks assessed                                        Exercises      General Comments General comments (skin integrity, edema, etc.): SpO2 and HR WNL; seated rest break required after descending stairs due to c/o dizziness; pt reports dizziness subsided with rest       Pertinent Vitals/Pain Pain Assessment: Faces Faces Pain Scale: Hurts a little bit Pain Location: stomach Pain Descriptors / Indicators: Discomfort Pain Intervention(s): Monitored during session    Home Living                      Prior Function            PT Goals (current goals can now be found in the care  plan section) Acute Rehab PT Goals Patient Stated Goal: return home Progress towards PT goals: Progressing toward goals    Frequency    Min 3X/week      PT Plan Current plan remains appropriate    Co-evaluation              AM-PAC PT "6 Clicks" Daily Activity  Outcome Measure  Difficulty turning over in bed (including adjusting bedclothes, sheets and blankets)?: A Little Difficulty moving from lying on back to sitting on the side of the bed? : A Little Difficulty sitting down on and standing up from a chair with arms (e.g., wheelchair, bedside commode, etc,.)?: Unable Help needed moving to and from a bed to chair (including a wheelchair)?: A Little Help needed walking in hospital room?: A Little Help needed climbing 3-5 steps  with a railing? : A Little 6 Click Score: 16    End of Session Equipment Utilized During Treatment: Gait belt Activity Tolerance: Patient tolerated treatment well Patient left: in chair;with call bell/phone within reach;with family/visitor present Nurse Communication: Mobility status PT Visit Diagnosis: Unsteadiness on feet (R26.81);Other abnormalities of gait and mobility (R26.89)     Time: 8472-0721 PT Time Calculation (min) (ACUTE ONLY): 16 min  Charges:  $Gait Training: 8-22 mins                     Earney Navy, PTA Acute Rehabilitation Services Pager: [phone] Office: [phone]     Darliss Cheney 10/31/2018, 3:48 PM

## 2018-10-31 NOTE — Discharge Summary (Addendum)
Discharge Summary  [name] WPY:[id number] DOB: Nov 09, 1951  PCP: Marco Collie, MD  Admit date: 10/23/2018 Discharge date: 10/31/2018  Time spent: 35 minutes  Recommendations for Outpatient Follow-up:  1. Follow-up with GI 2. Follow-up with PCP 3. Take your medications as prescribed  Discharge Diagnoses:  Active Hospital Problems   Diagnosis Date Noted  . Biliary colic 05/39/7673  . HTN (hypertension) 06/06/2018  . Seizure ([place]) 11/22/2017  . [place] DISEASE 10/25/2008  . COPD (chronic obstructive pulmonary disease) ([place]) 10/25/2008    Resolved Hospital Problems  No resolved problems to display.    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 Her course wascomplicatedby post ERCP pancreatitis. She was transferred here for further evaluation and management.Due to persistent pain, a CT scan of the abdomen and pelvis with contrast done on 10/27/18 was obtained. Fortunately it did not show any necrotizing pancreatitis. Elevated lipase. Trending down.  10/31/18: No acute events overnight. Tolerated full liquid diet well. Advancing diet to solids. Abdominal pain is improving. Lipase level is  normalizing.  Will discharge to home with Home Health PT RN once GI signs off.   Hospital Course:  Active Problems:   [place] DISEASE   COPD (chronic obstructive pulmonary disease) (HCC)   Seizure ([place])   HTN (hypertension)   Biliary colic  Choledocholithiasis status post ERCP, improving Transferred from [place] for management CT scan abd pelvis with contrast did not show pancreatic necrosis GI following  Post ERCP pancreatitis Management as stated above CT abdomen and pelvis done on 10/27/18 revealed pancreatitis but without abscess, fluid collection, or necrosis Continue with diet as recommended by general surgery Lipase continues to trend down from 60 to 40  Uncontrolled hypertension, improving Increased amlodipine from 2.5 mg to 10 mg daily Continue carvedilol 6.25 twice daily Continue doxazosin 2 mg daily Added hctz 12.5 mg daily  Resolved Hypokalemia post repletion Potassium 3.8 from 2.9  Resolving AKI Creatinine on presentation 1.57 with GFR of 33 Creatinine today 1.07 from 1.21  Appears to be at her baseline Follow up with PCP post hospitalization  Chronic normocytic anemia/iron deficiency anemia Baseline hemoglobin appears to be 9 Hemoglobin 8.1 No sign of overt bleeding Could be dilutional, DC IV fluid Ferritin 40 and trans-sat 16 Continue ferrous sulfate 325 mg twice daily  Neobladder, 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 coli -urine culture fromhere on11/14/2019 no growth to date -Completed course of  IV ceftriaxone  -Continue doxazosin 2 mg daily  Recent subdural: Patient apparently has had recent subdural with fall. 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  - She is had episodes of small bowel bacterial overgrowth.   -  Continuesodium bicarbonate 650 mg 3 times daily - Chemistry bicarb 19  Seizure disorder: -Continue phenytoin 20 mg nightly  Dementia/pain/psych: -Continue venlafaxine 150 mg every morning -Continue donepezil 10 mg nightly  Physical debility PT recommends HHPT Out of bed to chair as tolerated  Chronic tobacco use disorder Nicotine patch   Code Status: Full code  Family Communication: None at bedside  Disposition Plan: Home when GI signs off   Consultants:  Gastroenterology Dr. Judie Petit  General surgery  Procedures: 10/24/2018 ERCP: The major papilla appeared normal. - Choledocholithiasis was found. Complete removal was accomplished by biliary sphincterotomy and balloon extraction. - One plastic stent was placed into the ventral pancreatic duct to decrease the chance of pancreatitis and assist with cannulation. - A biliary sphincterotomy was performed. - The biliary tree was swept.     Discharge Exam: BP (!) 160/66 (BP Location: Right Arm)   Pulse 69   Temp 98.6 F (37 C) (Oral)   Resp 18   Ht 5\' 1"  (1.549 m)   Wt 45.1 kg   SpO2 100%   BMI 18.78 kg/m  . General: 67 y.o. year-old female well developed well nourished in no acute distress.  Alert and oriented x3. . Cardiovascular: Regular rate and rhythm with no rubs or gallops.  No thyromegaly or JVD noted.   Marland Kitchen Respiratory: Clear to auscultation with no wheezes or rales. Good inspiratory effort. . Abdomen: Soft nontender nondistended with normal bowel sounds x4 quadrants. . Musculoskeletal: No lower extremity edema. 2/4 pulses in all 4 extremities. . Skin: No ulcerative lesions noted or rashes, . Psychiatry: Mood is appropriate for condition and setting  Discharge Instructions You were cared for by a hospitalist during your hospital stay. If you have any questions about your discharge medications or the care you received while you were in the hospital after you are discharged, you can  call the unit and asked to speak with the hospitalist on call if the hospitalist that took care of you is not available. Once you are discharged, your primary care physician will handle any further medical issues. Please note that NO REFILLS for any discharge medications will be authorized once you are discharged, as it is imperative that you return to your primary care physician (or establish a relationship with a primary care physician if you do not have one) for your aftercare needs so that they can reassess your need for medications and monitor your lab values.   Allergies as of 10/31/2018      Reactions   Codeine Nausea And Vomiting   REACTION: intolerance   Latex Other (See Comments)   itching      Medication List    TAKE these medications   amLODipine 10 MG tablet Commonly known as:  NORVASC Take 1 tablet (10 mg total) by mouth every evening. What changed:    medication strength  how much to take   ANORO ELLIPTA 62.5-25 MCG/INH Aepb Generic drug:  umeclidinium-vilanterol Inhale 1 puff into the lungs daily as needed (shortness of breath or wheezing).   atorvastatin 10 MG tablet Commonly known as:  LIPITOR Take 1 tablet (10 mg total) by mouth daily. Start taking on:  11/01/2018 What changed:    medication strength  how much to take   carvedilol 12.5 MG tablet Commonly known as:  COREG Take 6.25 mg by mouth 2 (two) times daily with a meal.   diclofenac sodium 1 % Gel Commonly known as:  VOLTAREN Apply 2 g topically 3 (three) times  daily.   donepezil 10 MG tablet Commonly known as:  ARICEPT TAKE 1 BY MOUTH AT BEDTIME What changed:    how much to take  how to take this  when to take this  additional instructions   doxazosin 2 MG tablet Commonly known as:  CARDURA Take 2 mg by mouth daily.   ferrous sulfate 325 (65 FE) MG tablet Take 1 tablet (325 mg total) by mouth 2 (two) times daily with a meal.   hydrochlorothiazide 12.5 MG capsule Commonly known  as:  MICROZIDE Take 1 capsule (12.5 mg total) by mouth daily. Start taking on:  11/01/2018   nicotine 21 mg/24hr patch Commonly known as:  NICODERM CQ - dosed in mg/24 hours Place 1 patch (21 mg total) onto the skin daily. Start taking on:  11/01/2018   oxyCODONE 5 MG immediate release tablet Commonly known as:  Oxy IR/ROXICODONE Take 0.5 tablets (2.5 mg total) by mouth every 8 (eight) hours as needed (pain).   phenytoin 100 MG ER capsule Commonly known as:  DILANTIN Take 2 capsules (200 mg total) by mouth at bedtime.   sodium bicarbonate 650 MG tablet Take 1 tablet (650 mg total) by mouth 3 (three) times daily.   venlafaxine XR 150 MG 24 hr capsule Commonly known as:  EFFEXOR-XR Take 150 mg by mouth daily with breakfast.            Durable Medical Equipment  (From admission, onward)         Start     Ordered   10/31/18 1050  For home use only DME Walker rolling  Once    Question:  Patient needs a walker to treat with the following condition  Answer:  Choledocholithiasis   10/31/18 1050         Allergies  Allergen Reactions  . Codeine Nausea And Vomiting    REACTION: intolerance  . Latex Other (See Comments)    itching   Follow-up Information    Marco Collie, MD. Call in 1 day(s).   Specialty:  Family Medicine Why:  Please call for a post hospital follow-up appointment. Contact information: [place] [address] [phone]        Arta Silence, MD. Call in 1 day(s).   Specialty:  Gastroenterology Why:  Please call for a post hospital follow-up appointment Contact information: [address] [place] [address] [phone]            The results of significant diagnostics from this hospitalization (including imaging, microbiology, ancillary and laboratory) are listed below for reference.    Significant Diagnostic Studies: X-ray Chest Pa And Lateral  Result Date: 10/23/2018 CLINICAL DATA:   Cholecystitis EXAM: CHEST - 2 VIEW COMPARISON:  09/17/2018 CXR FINDINGS: Top-normal heart size. Moderate aortic atherosclerosis. No pulmonary consolidation to suggest pneumonia. Minimal scarring or atelectasis in the left mid lung. Remote right-sided rib fractures. No acute osseous abnormality. There is mild dextroconvex curvature of the thoracic spine. Pigtail catheter is seen on the lateral view below the diaphragm. IMPRESSION: No active cardiopulmonary disease. Aortic atherosclerosis. Electronically Signed   By: Ashley Royalty M.D.   On: 10/23/2018 19:20   US Abdomen Complete  Result Date: 10/23/2018 CLINICAL DATA:  Acute onset of right upper quadrant abdominal pain. Assess for cholecystitis. EXAM: ABDOMEN ULTRASOUND COMPLETE COMPARISON:  Right upper quadrant ultrasound performed earlier today at 8:16 a.m., and CT of the abdomen and pelvis performed 08/17/2018 FINDINGS: Gallbladder: No gallstones or wall thickening visualized.  No sonographic Murphy sign noted by sonographer. Common bile duct: Diameter: 0.5 cm, within normal limits in caliber. A 7 mm stone was noted in the mid common bile duct on the prior ultrasound. Liver: No focal lesion identified. There is prominence of the intrahepatic biliary ducts, which raises question for distal obstruction. Within normal limits in parenchymal echogenicity. Portal vein is patent on color Doppler imaging with normal direction of blood flow towards the liver. IVC: No abnormality visualized. Pancreas: Visualized portion unremarkable. Spleen: Size and appearance within normal limits. The vasculature within the spleen is mildly hyperechoic, thought to reflect normal barium. Right Kidney: Length: 10.6 cm. Increased parenchymal echogenicity is noted. Mild right-sided hydronephrosis is seen. A 1.4 cm cyst is noted at the interpole region of the right kidney, with a thin septation. Left Kidney: Length: 10.2 cm. Increased parenchymal echogenicity is noted. Mild left-sided  hydronephrosis is seen. No mass visualized. Abdominal aorta: No aneurysm visualized. Other findings: None. IMPRESSION: 1. Prominence of the intrahepatic biliary ducts raises question for distal obstruction. Would correlate with LFTs. The common bile duct remains normal in caliber, though a 7 mm stone was noted in the mid common bile duct on the prior ultrasound. 2. Mildly increased renal parenchymal echogenicity raises concern for medical renal disease. 3. Mild bilateral hydronephrosis, of uncertain significance. 4. 1.4 cm right renal cyst incidentally noted, with a thin septation. Electronically Signed   By: Garald Balding M.D.   On: 10/23/2018 23:42   Ct Abdomen Pelvis W Contrast  Result Date: 10/28/2018 CLINICAL DATA:  Right upper quadrant pain. Cholelithiasis and post ERCP on 10/24/2018. Now with posterior CP pancreatitis. Weight loss. EXAM: CT ABDOMEN AND PELVIS WITH CONTRAST TECHNIQUE: Multidetector CT imaging of the abdomen and pelvis was performed using the standard protocol following bolus administration of intravenous contrast. CONTRAST:  153mL OMNIPAQUE IOHEXOL 300 MG/ML  SOLN COMPARISON:  08/17/2018 FINDINGS: Lower chest: Small bilateral pleural effusions with atelectasis in the lung bases. Hepatobiliary: No focal liver lesions. Pneumobilia is likely postprocedural. Increased density in the gallbladder may represent vicarious excretion of contrast material or gallbladder sludge. No gallbladder wall thickening or discrete stones. Pancreas: Swelling of the head of the pancreas with infiltration and edema around the head and body of the pancreas and extending to the anterior pararenal spaces. Changes are consistent with acute pancreatitis. There is a stent in the pancreatic duct. No pancreatic ductal dilatation. No loculated collections. No evidence of pancreatic necrosis. Spleen: Calcified granulomas in the spleen. Adrenals/Urinary Tract: No adrenal gland nodules. Bilateral renal parenchymal atrophy,  greater on the left. Left ureteral stent is in place. Persistent left hydronephrosis possibly due to reflux. Bladder is incompletely distended. Foley catheter is present. Stomach/Bowel: Stomach, small bowel, and colon are not abnormally distended. No bowel wall thickening or inflammatory changes. Contrast material flows through to the rectum without evidence of obstruction. Appendix is not identified. Vascular/Lymphatic: Extensive vascular calcifications of the aorta and branch vessels. Left common iliac artery stent. No significant lymphadenopathy. Mild prominence of retroperitoneal lymph nodes, likely reactive. No pathologic enlargement. Reproductive: Status post hysterectomy. No adnexal masses. Other: No abdominal wall hernia or abnormality. No abdominopelvic ascites. Musculoskeletal: Healing fracture of the right superior and inferior pubic ramus. No acute bony abnormalities. IMPRESSION: 1. Changes of acute pancreatitis with peripancreatic infiltration and edema. Pancreatic duct stent in place. No evidence of pancreatic necrosis or loculated fluid collection. 2. Small bilateral pleural effusions with basilar atelectasis. 3. Extensive vascular calcifications. 4. Healing fracture of the right superior and inferior  pubic ramus. Electronically Signed   By: Lucienne Capers M.D.   On: 10/28/2018 01:43   Dg Ercp Biliary & Pancreatic Ducts  Result Date: 10/24/2018 CLINICAL DATA:  Choledocholithiasis EXAM: ERCP with sphincterotomy and stone removal TECHNIQUE: Multiple spot images obtained with the fluoroscopic device and submitted for interpretation post-procedure. FLUOROSCOPY TIME:  Fluoroscopy Time:  7 minutes 33 seconds Radiation Exposure Index (if provided by the fluoroscopic device): None available Number of Acquired Spot Images: 2 COMPARISON:  10/23/2018 FINDINGS: Two spot fluoroscopic views during the ERCP. Limited opacification biliary tree. Sphincterotomy and balloon sweep performed. Please refer to the  ERCP report for full details of the procedure. No gross biliary obstruction. IMPRESSION: Limited imaging during ERCP with sphincterotomy and balloon sweep. No biliary obstruction. These images were submitted for radiologic interpretation only. Please see the procedural report for the amount of contrast and the fluoroscopy time utilized. Electronically Signed   By: Jerilynn Mages.  Shick M.D.   On: 10/24/2018 15:26   Dg Abd 2 Views  Result Date: 10/30/2018 CLINICAL DATA:  Displaced biliary stent open (peripancreatic stent placed October 24, 2018). History nausea and vomiting. EXAM: ABDOMEN - 2 VIEW COMPARISON:  CT abdomen and pelvis October 27, 2018 FINDINGS: Stent RIGHT mid abdomen with single retaining loop. Double-lumen LEFT nephroureteral stent. LEFT common iliac artery stent. Numerous surgical clips RIGHT greater than LEFT pelvis. Mild pneumobilia similar to prior CT. Single loop of gas distended bowel measuring 4.7 cm LEFT upper quadrant, scattered air-fluid levels. No intraperitoneal free air. No intra-abdominal mass effect. Single loop of contrast filled small bowel in the pelvis. Soft tissue planes and included osseous structures are unchanged. Old LEFT rib fractures. IMPRESSION: 1. Pancreatic stent projects mid abdomen, stable pneumobilia. 2. Focal LEFT upper quadrant ileus versus early bowel obstruction. Electronically Signed   By: Elon Alas M.D.   On: 10/30/2018 20:16    Microbiology: Recent Results (from the past 240 hour(s))  Urine culture     Status: None   Collection Time: 10/23/18  8:14 PM  Result Value Ref Range Status   Specimen Description URINE, SUPRAPUBIC  Final   Special Requests   Final    NONE Performed at [facility], [address] [place], [place] [address]    Culture   Final    Multiple bacterial morphotypes present, none predominant. Suggest appropriate recollection if clinically indicated.   Report Status 10/25/2018 FINAL  Final     Labs: Basic Metabolic  Panel: Recent Labs  Lab 10/25/18 0529 10/26/18 0314 10/27/18 0218 10/28/18 0348 10/30/18 0745 10/31/18 0620  NA 136 139 142 139 143 141  K 4.7 3.6 3.8 3.8 2.9* 3.8  CL 116* 116* 119* 116* 119* 118*  CO2 12* 13* 15* 14* 19* 19*  GLUCOSE 71 98 75 56* 111* 108*  BUN 22 13 16 16  <5* <5*  CREATININE 1.57* 1.40* 1.25* 1.21* 1.06* 1.07*  CALCIUM 7.8* 8.0* 7.7* 7.7* 7.6* 7.5*  MG 1.9  --  1.8  --   --   --    Liver Function Tests: Recent Labs  Lab 10/25/18 0529 10/26/18 0314 10/27/18 0218 10/28/18 0348  AST 14* 11* 13* 13*  ALT 11 8 9 8   ALKPHOS 153* 149* 129* 115  BILITOT 0.3 0.6 0.4 0.6  PROT 5.9* 5.8* 5.8* 5.4*  ALBUMIN 2.9* 3.0* 2.7* 2.4*   Recent Labs  Lab 10/26/18 0314 10/28/18 0348 10/30/18 0745 10/31/18 0620  LIPASE 720* 115* 60* 45   No results for input(s): AMMONIA  in the last 168 hours. CBC: Recent Labs  Lab 10/26/18 0314 10/27/18 0218 10/28/18 0348 10/30/18 0745 10/31/18 0620  WBC 11.5* 8.5 6.6 4.3 4.6  NEUTROABS  --   --   --   --  2.8  HGB 9.7* 8.4* 7.9* 8.8* 8.1*  HCT 33.2* 28.2* 27.4* 29.0* 27.7*  MCV 91.7 91.9 94.5 91.2 93.0  PLT 125* PLATELET CLUMPS NOTED ON SMEAR, UNABLE TO ESTIMATE 93* 106* 92*   Cardiac Enzymes: No results for input(s): CKTOTAL, CKMB, CKMBINDEX, TROPONINI in the last 168 hours. BNP: BNP (last 3 results) No results for input(s): BNP in the last 8760 hours.  ProBNP (last 3 results) No results for input(s): PROBNP in the last 8760 hours.  CBG: No results for input(s): GLUCAP in the last 168 hours.     Signed:  Kayleen Memos, MD [facility] 10/31/2018, 4:55 PM

## 2018-10-31 NOTE — Progress Notes (Signed)
Subjective: Abdominal pain resolved.  Objective: Vital signs in last 24 hours: Temp:  [98 F (36.7 C)-99.1 F (37.3 C)] 98.6 F (37 C) (11/22 1357) Pulse Rate:  [61-69] 69 (11/22 1357) Resp:  [16-18] 18 (11/22 1357) BP: (160-194)/(62-79) 160/66 (11/22 1357) SpO2:  [97 %-100 %] 100 % (11/22 1357) Weight change:  Last BM Date: 10/30/18  PE: GEN:  NAD  Lab Results: CBC    Component Value Date/Time   WBC 4.6 10/31/2018 0620   RBC 2.98 (L) 10/31/2018 0620   HGB 8.1 (L) 10/31/2018 0620   HGB 11.0 (L) 10/13/2018 1127   HCT 27.7 (L) 10/31/2018 0620   HCT 35.6 10/13/2018 1127   PLT 92 (L) 10/31/2018 0620   PLT 204 10/13/2018 1127   MCV 93.0 10/31/2018 0620   MCV 87 10/13/2018 1127   MCH 27.2 10/31/2018 0620   MCHC 29.2 (L) 10/31/2018 0620   RDW 15.2 10/31/2018 0620   RDW 13.6 10/13/2018 1127   LYMPHSABS 1.1 10/31/2018 0620   LYMPHSABS 2.3 10/13/2018 1127   MONOABS 0.6 10/31/2018 0620   EOSABS 0.1 10/31/2018 0620   EOSABS 0.2 10/13/2018 1127   BASOSABS 0.0 10/31/2018 0620   BASOSABS 0.1 10/13/2018 1127   CMP     Component Value Date/Time   NA 141 10/31/2018 0620   NA 138 10/13/2018 1127   K 3.8 10/31/2018 0620   CL 118 (H) 10/31/2018 0620   CO2 19 (L) 10/31/2018 0620   GLUCOSE 108 (H) 10/31/2018 0620   BUN <5 (L) 10/31/2018 0620   BUN 15 10/13/2018 1127   CREATININE 1.07 (H) 10/31/2018 0620   CALCIUM 7.5 (L) 10/31/2018 0620   PROT 5.4 (L) 10/28/2018 0348   PROT 7.1 10/13/2018 1127   ALBUMIN 2.4 (L) 10/28/2018 0348   ALBUMIN 4.1 10/13/2018 1127   AST 13 (L) 10/28/2018 0348   ALT 8 10/28/2018 0348   ALKPHOS 115 10/28/2018 0348   BILITOT 0.6 10/28/2018 0348   BILITOT <0.2 10/13/2018 1127   GFRNONAA 52 (L) 10/31/2018 0620   GFRAA >60 10/31/2018 2956    Studies/Results: abd xray:  Pd stent still in place  Assessment:  1. Choledocholithiasis, s/p ERCP this admissionwith stone extraction. 2. Post-ERCP pancreatitis. 3.  Abdominal pain,  resolved.  Plan:  1.  OK to d/c from GI perspective. 2.  Needs EGD + stent removal electively in next couple weeks. 3.  Will sign-off; please call with questions.  Landry Dyke 10/31/2018, 4:30 PM   Cell [phone] If no answer or after 5 PM call [phone]

## 2018-10-31 NOTE — Discharge Instructions (Signed)
Cholelithiasis Cholelithiasis is also called "gallstones." It is a kind of gallbladder disease. The gallbladder is an organ that stores a liquid (bile) that helps you digest fat. Gallstones may not cause symptoms (may be silent gallstones) until they cause a blockage, and then they can cause pain (gallbladder attack). Follow these instructions at home:  Take over-the-counter and prescription medicines only as told by your doctor.  Stay at a healthy weight.  Eat healthy foods. This includes: ? Eating fewer fatty foods, like fried foods. ? Eating fewer refined carbs (refined carbohydrates). Refined carbs are breads and grains that are highly processed, like white bread and white rice. Instead, choose whole grains like whole-wheat bread and brown rice. ? Eating more fiber. Almonds, fresh fruit, and beans are healthy sources of fiber.  Keep all follow-up visits as told by your doctor. This is important. Contact a doctor if:  You have sudden pain in the upper right side of your belly (abdomen). Pain might spread to your right shoulder or your chest. This may be a sign of a gallbladder attack.  You feel sick to your stomach (are nauseous).  You throw up (vomit).  You have been diagnosed with gallstones that have no symptoms and you get: ? Belly pain. ? Discomfort, burning, or fullness in the upper part of your belly (indigestion). Get help right away if:  You have sudden pain in the upper right side of your belly, and it lasts for more than 2 hours.  You have belly pain that lasts for more than 5 hours.  You have a fever or chills.  You keep feeling sick to your stomach or you keep throwing up.  Your skin or the whites of your eyes turn yellow (jaundice).  You have dark-colored pee (urine).  You have light-colored poop (stool). Summary  Cholelithiasis is also called "gallstones."  The gallbladder is an organ that stores a liquid (bile) that helps you digest fat.  Silent  gallstones are gallstones that do not cause symptoms.  A gallbladder attack may cause sudden pain in the upper right side of your belly. Pain might spread to your right shoulder or your chest. If this happens, contact your doctor.  If you have sudden pain in the upper right side of your belly that lasts for more than 2 hours, get help right away. This information is not intended to replace advice given to you by your health care provider. Make sure you discuss any questions you have with your health care provider. Document Released: 05/14/2008 Document Revised: 08/12/2016 Document Reviewed: 08/12/2016 Elsevier Interactive Patient Education  2017 [employer]  

## 2018-11-03 DIAGNOSIS — N183 Chronic kidney disease, stage 3 (moderate): Secondary | ICD-10-CM | POA: Diagnosis not present

## 2018-11-03 DIAGNOSIS — D631 Anemia in chronic kidney disease: Secondary | ICD-10-CM | POA: Diagnosis not present

## 2018-11-03 DIAGNOSIS — G40802 Other epilepsy, not intractable, without status epilepticus: Secondary | ICD-10-CM | POA: Diagnosis not present

## 2018-11-03 DIAGNOSIS — K859 Acute pancreatitis without necrosis or infection, unspecified: Secondary | ICD-10-CM | POA: Diagnosis not present

## 2018-11-03 DIAGNOSIS — N319 Neuromuscular dysfunction of bladder, unspecified: Secondary | ICD-10-CM | POA: Diagnosis not present

## 2018-11-03 DIAGNOSIS — K9189 Other postprocedural complications and disorders of digestive system: Secondary | ICD-10-CM | POA: Diagnosis not present

## 2018-11-03 DIAGNOSIS — I129 Hypertensive chronic kidney disease with stage 1 through stage 4 chronic kidney disease, or unspecified chronic kidney disease: Secondary | ICD-10-CM | POA: Diagnosis not present

## 2018-11-03 DIAGNOSIS — K8050 Calculus of bile duct without cholangitis or cholecystitis without obstruction: Secondary | ICD-10-CM | POA: Diagnosis not present

## 2018-11-03 DIAGNOSIS — G300 Alzheimer's disease with early onset: Secondary | ICD-10-CM | POA: Diagnosis not present

## 2018-11-03 DIAGNOSIS — R69 Illness, unspecified: Secondary | ICD-10-CM | POA: Diagnosis not present

## 2018-11-05 DIAGNOSIS — R69 Illness, unspecified: Secondary | ICD-10-CM | POA: Diagnosis not present

## 2018-11-05 DIAGNOSIS — K8050 Calculus of bile duct without cholangitis or cholecystitis without obstruction: Secondary | ICD-10-CM | POA: Diagnosis not present

## 2018-11-05 DIAGNOSIS — Z1331 Encounter for screening for depression: Secondary | ICD-10-CM | POA: Diagnosis not present

## 2018-11-05 DIAGNOSIS — K859 Acute pancreatitis without necrosis or infection, unspecified: Secondary | ICD-10-CM | POA: Diagnosis not present

## 2018-11-05 DIAGNOSIS — Z09 Encounter for follow-up examination after completed treatment for conditions other than malignant neoplasm: Secondary | ICD-10-CM | POA: Diagnosis not present

## 2018-11-05 DIAGNOSIS — Z681 Body mass index (BMI) 19 or less, adult: Secondary | ICD-10-CM | POA: Diagnosis not present

## 2018-11-05 DIAGNOSIS — Z79899 Other long term (current) drug therapy: Secondary | ICD-10-CM | POA: Diagnosis not present

## 2018-11-07 DIAGNOSIS — G300 Alzheimer's disease with early onset: Secondary | ICD-10-CM | POA: Diagnosis not present

## 2018-11-07 DIAGNOSIS — N183 Chronic kidney disease, stage 3 (moderate): Secondary | ICD-10-CM | POA: Diagnosis not present

## 2018-11-07 DIAGNOSIS — I129 Hypertensive chronic kidney disease with stage 1 through stage 4 chronic kidney disease, or unspecified chronic kidney disease: Secondary | ICD-10-CM | POA: Diagnosis not present

## 2018-11-07 DIAGNOSIS — N319 Neuromuscular dysfunction of bladder, unspecified: Secondary | ICD-10-CM | POA: Diagnosis not present

## 2018-11-07 DIAGNOSIS — K859 Acute pancreatitis without necrosis or infection, unspecified: Secondary | ICD-10-CM | POA: Diagnosis not present

## 2018-11-07 DIAGNOSIS — K9189 Other postprocedural complications and disorders of digestive system: Secondary | ICD-10-CM | POA: Diagnosis not present

## 2018-11-07 DIAGNOSIS — G40802 Other epilepsy, not intractable, without status epilepticus: Secondary | ICD-10-CM | POA: Diagnosis not present

## 2018-11-07 DIAGNOSIS — K8050 Calculus of bile duct without cholangitis or cholecystitis without obstruction: Secondary | ICD-10-CM | POA: Diagnosis not present

## 2018-11-07 DIAGNOSIS — R69 Illness, unspecified: Secondary | ICD-10-CM | POA: Diagnosis not present

## 2018-11-07 DIAGNOSIS — D631 Anemia in chronic kidney disease: Secondary | ICD-10-CM | POA: Diagnosis not present

## 2018-11-11 DIAGNOSIS — K8070 Calculus of gallbladder and bile duct without cholecystitis without obstruction: Secondary | ICD-10-CM | POA: Diagnosis not present

## 2018-11-12 DIAGNOSIS — K9189 Other postprocedural complications and disorders of digestive system: Secondary | ICD-10-CM | POA: Diagnosis not present

## 2018-11-12 DIAGNOSIS — K8050 Calculus of bile duct without cholangitis or cholecystitis without obstruction: Secondary | ICD-10-CM | POA: Diagnosis not present

## 2018-11-12 DIAGNOSIS — N319 Neuromuscular dysfunction of bladder, unspecified: Secondary | ICD-10-CM | POA: Diagnosis not present

## 2018-11-12 DIAGNOSIS — D631 Anemia in chronic kidney disease: Secondary | ICD-10-CM | POA: Diagnosis not present

## 2018-11-12 DIAGNOSIS — K859 Acute pancreatitis without necrosis or infection, unspecified: Secondary | ICD-10-CM | POA: Diagnosis not present

## 2018-11-12 DIAGNOSIS — G40802 Other epilepsy, not intractable, without status epilepticus: Secondary | ICD-10-CM | POA: Diagnosis not present

## 2018-11-12 DIAGNOSIS — G300 Alzheimer's disease with early onset: Secondary | ICD-10-CM | POA: Diagnosis not present

## 2018-11-12 DIAGNOSIS — N183 Chronic kidney disease, stage 3 (moderate): Secondary | ICD-10-CM | POA: Diagnosis not present

## 2018-11-12 DIAGNOSIS — I129 Hypertensive chronic kidney disease with stage 1 through stage 4 chronic kidney disease, or unspecified chronic kidney disease: Secondary | ICD-10-CM | POA: Diagnosis not present

## 2018-11-12 DIAGNOSIS — R69 Illness, unspecified: Secondary | ICD-10-CM | POA: Diagnosis not present

## 2018-11-13 DIAGNOSIS — D631 Anemia in chronic kidney disease: Secondary | ICD-10-CM | POA: Diagnosis not present

## 2018-11-13 DIAGNOSIS — R69 Illness, unspecified: Secondary | ICD-10-CM | POA: Diagnosis not present

## 2018-11-13 DIAGNOSIS — G40802 Other epilepsy, not intractable, without status epilepticus: Secondary | ICD-10-CM | POA: Diagnosis not present

## 2018-11-13 DIAGNOSIS — N319 Neuromuscular dysfunction of bladder, unspecified: Secondary | ICD-10-CM | POA: Diagnosis not present

## 2018-11-13 DIAGNOSIS — K859 Acute pancreatitis without necrosis or infection, unspecified: Secondary | ICD-10-CM | POA: Diagnosis not present

## 2018-11-13 DIAGNOSIS — K9189 Other postprocedural complications and disorders of digestive system: Secondary | ICD-10-CM | POA: Diagnosis not present

## 2018-11-13 DIAGNOSIS — G300 Alzheimer's disease with early onset: Secondary | ICD-10-CM | POA: Diagnosis not present

## 2018-11-13 DIAGNOSIS — I129 Hypertensive chronic kidney disease with stage 1 through stage 4 chronic kidney disease, or unspecified chronic kidney disease: Secondary | ICD-10-CM | POA: Diagnosis not present

## 2018-11-13 DIAGNOSIS — K8050 Calculus of bile duct without cholangitis or cholecystitis without obstruction: Secondary | ICD-10-CM | POA: Diagnosis not present

## 2018-11-13 DIAGNOSIS — N183 Chronic kidney disease, stage 3 (moderate): Secondary | ICD-10-CM | POA: Diagnosis not present

## 2018-11-18 DIAGNOSIS — K8050 Calculus of bile duct without cholangitis or cholecystitis without obstruction: Secondary | ICD-10-CM | POA: Diagnosis not present

## 2018-11-18 DIAGNOSIS — G300 Alzheimer's disease with early onset: Secondary | ICD-10-CM | POA: Diagnosis not present

## 2018-11-18 DIAGNOSIS — I129 Hypertensive chronic kidney disease with stage 1 through stage 4 chronic kidney disease, or unspecified chronic kidney disease: Secondary | ICD-10-CM | POA: Diagnosis not present

## 2018-11-18 DIAGNOSIS — N183 Chronic kidney disease, stage 3 (moderate): Secondary | ICD-10-CM | POA: Diagnosis not present

## 2018-11-18 DIAGNOSIS — D631 Anemia in chronic kidney disease: Secondary | ICD-10-CM | POA: Diagnosis not present

## 2018-11-18 DIAGNOSIS — N319 Neuromuscular dysfunction of bladder, unspecified: Secondary | ICD-10-CM | POA: Diagnosis not present

## 2018-11-18 DIAGNOSIS — K9189 Other postprocedural complications and disorders of digestive system: Secondary | ICD-10-CM | POA: Diagnosis not present

## 2018-11-18 DIAGNOSIS — G40802 Other epilepsy, not intractable, without status epilepticus: Secondary | ICD-10-CM | POA: Diagnosis not present

## 2018-11-18 DIAGNOSIS — K859 Acute pancreatitis without necrosis or infection, unspecified: Secondary | ICD-10-CM | POA: Diagnosis not present

## 2018-11-18 DIAGNOSIS — R69 Illness, unspecified: Secondary | ICD-10-CM | POA: Diagnosis not present

## 2018-11-20 DIAGNOSIS — R69 Illness, unspecified: Secondary | ICD-10-CM | POA: Diagnosis not present

## 2018-11-20 DIAGNOSIS — Z8673 Personal history of transient ischemic attack (TIA), and cerebral infarction without residual deficits: Secondary | ICD-10-CM | POA: Diagnosis not present

## 2018-11-20 DIAGNOSIS — K915 Postcholecystectomy syndrome: Secondary | ICD-10-CM | POA: Diagnosis not present

## 2018-11-20 DIAGNOSIS — K811 Chronic cholecystitis: Secondary | ICD-10-CM | POA: Diagnosis not present

## 2018-11-20 DIAGNOSIS — Z7902 Long term (current) use of antithrombotics/antiplatelets: Secondary | ICD-10-CM | POA: Diagnosis not present

## 2018-11-20 DIAGNOSIS — Z79899 Other long term (current) drug therapy: Secondary | ICD-10-CM | POA: Diagnosis not present

## 2018-11-20 DIAGNOSIS — I10 Essential (primary) hypertension: Secondary | ICD-10-CM | POA: Diagnosis not present

## 2018-11-20 DIAGNOSIS — K8010 Calculus of gallbladder with chronic cholecystitis without obstruction: Secondary | ICD-10-CM | POA: Diagnosis not present

## 2018-11-25 DIAGNOSIS — I129 Hypertensive chronic kidney disease with stage 1 through stage 4 chronic kidney disease, or unspecified chronic kidney disease: Secondary | ICD-10-CM | POA: Diagnosis not present

## 2018-11-25 DIAGNOSIS — K8050 Calculus of bile duct without cholangitis or cholecystitis without obstruction: Secondary | ICD-10-CM | POA: Diagnosis not present

## 2018-11-25 DIAGNOSIS — K859 Acute pancreatitis without necrosis or infection, unspecified: Secondary | ICD-10-CM | POA: Diagnosis not present

## 2018-11-25 DIAGNOSIS — N319 Neuromuscular dysfunction of bladder, unspecified: Secondary | ICD-10-CM | POA: Diagnosis not present

## 2018-11-25 DIAGNOSIS — D631 Anemia in chronic kidney disease: Secondary | ICD-10-CM | POA: Diagnosis not present

## 2018-11-25 DIAGNOSIS — G300 Alzheimer's disease with early onset: Secondary | ICD-10-CM | POA: Diagnosis not present

## 2018-11-25 DIAGNOSIS — G40802 Other epilepsy, not intractable, without status epilepticus: Secondary | ICD-10-CM | POA: Diagnosis not present

## 2018-11-25 DIAGNOSIS — N183 Chronic kidney disease, stage 3 (moderate): Secondary | ICD-10-CM | POA: Diagnosis not present

## 2018-11-25 DIAGNOSIS — R69 Illness, unspecified: Secondary | ICD-10-CM | POA: Diagnosis not present

## 2018-11-25 DIAGNOSIS — K9189 Other postprocedural complications and disorders of digestive system: Secondary | ICD-10-CM | POA: Diagnosis not present

## 2018-11-28 DIAGNOSIS — R339 Retention of urine, unspecified: Secondary | ICD-10-CM | POA: Diagnosis not present

## 2018-11-28 DIAGNOSIS — N390 Urinary tract infection, site not specified: Secondary | ICD-10-CM | POA: Diagnosis not present

## 2018-11-28 DIAGNOSIS — Z09 Encounter for follow-up examination after completed treatment for conditions other than malignant neoplasm: Secondary | ICD-10-CM | POA: Diagnosis not present

## 2018-11-28 DIAGNOSIS — Z681 Body mass index (BMI) 19 or less, adult: Secondary | ICD-10-CM | POA: Diagnosis not present

## 2018-12-02 DIAGNOSIS — N319 Neuromuscular dysfunction of bladder, unspecified: Secondary | ICD-10-CM | POA: Diagnosis not present

## 2018-12-02 DIAGNOSIS — D631 Anemia in chronic kidney disease: Secondary | ICD-10-CM | POA: Diagnosis not present

## 2018-12-02 DIAGNOSIS — K859 Acute pancreatitis without necrosis or infection, unspecified: Secondary | ICD-10-CM | POA: Diagnosis not present

## 2018-12-02 DIAGNOSIS — K8050 Calculus of bile duct without cholangitis or cholecystitis without obstruction: Secondary | ICD-10-CM | POA: Diagnosis not present

## 2018-12-02 DIAGNOSIS — K9189 Other postprocedural complications and disorders of digestive system: Secondary | ICD-10-CM | POA: Diagnosis not present

## 2018-12-02 DIAGNOSIS — N183 Chronic kidney disease, stage 3 (moderate): Secondary | ICD-10-CM | POA: Diagnosis not present

## 2018-12-02 DIAGNOSIS — I129 Hypertensive chronic kidney disease with stage 1 through stage 4 chronic kidney disease, or unspecified chronic kidney disease: Secondary | ICD-10-CM | POA: Diagnosis not present

## 2018-12-02 DIAGNOSIS — G40802 Other epilepsy, not intractable, without status epilepticus: Secondary | ICD-10-CM | POA: Diagnosis not present

## 2018-12-02 DIAGNOSIS — R69 Illness, unspecified: Secondary | ICD-10-CM | POA: Diagnosis not present

## 2018-12-02 DIAGNOSIS — G300 Alzheimer's disease with early onset: Secondary | ICD-10-CM | POA: Diagnosis not present

## 2018-12-08 ENCOUNTER — Telehealth: Payer: Self-pay | Admitting: Neurology

## 2018-12-08 ENCOUNTER — Other Ambulatory Visit: Payer: Self-pay | Admitting: Neurology

## 2018-12-08 MED ORDER — DONEPEZIL HCL 10 MG PO TABS: ORAL_TABLET | ORAL | 1 refills | Status: DC

## 2018-12-08 NOTE — Telephone Encounter (Signed)
im not sure why the medication was denied I have refilled the aricept for the patient and sent to the pharmacy on the patient's behalf.

## 2018-12-08 NOTE — Telephone Encounter (Signed)
[facility] [phone] request refill for donepezil (ARICEPT) 10 MG tablet. She said it was sent electronically and was denied stating the patient was unknown

## 2018-12-18 DIAGNOSIS — Z9049 Acquired absence of other specified parts of digestive tract: Secondary | ICD-10-CM | POA: Diagnosis not present

## 2018-12-22 DIAGNOSIS — E43 Unspecified severe protein-calorie malnutrition: Secondary | ICD-10-CM | POA: Diagnosis not present

## 2018-12-22 DIAGNOSIS — G9341 Metabolic encephalopathy: Secondary | ICD-10-CM | POA: Diagnosis not present

## 2018-12-22 DIAGNOSIS — D500 Iron deficiency anemia secondary to blood loss (chronic): Secondary | ICD-10-CM | POA: Diagnosis not present

## 2018-12-22 DIAGNOSIS — J984 Other disorders of lung: Secondary | ICD-10-CM | POA: Diagnosis not present

## 2018-12-22 DIAGNOSIS — I10 Essential (primary) hypertension: Secondary | ICD-10-CM | POA: Diagnosis not present

## 2018-12-22 DIAGNOSIS — D689 Coagulation defect, unspecified: Secondary | ICD-10-CM | POA: Diagnosis not present

## 2018-12-22 DIAGNOSIS — I63233 Cerebral infarction due to unspecified occlusion or stenosis of bilateral carotid arteries: Secondary | ICD-10-CM | POA: Diagnosis not present

## 2018-12-22 DIAGNOSIS — D696 Thrombocytopenia, unspecified: Secondary | ICD-10-CM | POA: Diagnosis not present

## 2018-12-22 DIAGNOSIS — I7774 Dissection of vertebral artery: Secondary | ICD-10-CM | POA: Diagnosis not present

## 2018-12-22 DIAGNOSIS — Z452 Encounter for adjustment and management of vascular access device: Secondary | ICD-10-CM | POA: Diagnosis not present

## 2018-12-22 DIAGNOSIS — N390 Urinary tract infection, site not specified: Secondary | ICD-10-CM | POA: Diagnosis not present

## 2018-12-22 DIAGNOSIS — D631 Anemia in chronic kidney disease: Secondary | ICD-10-CM | POA: Diagnosis not present

## 2018-12-22 DIAGNOSIS — J449 Chronic obstructive pulmonary disease, unspecified: Secondary | ICD-10-CM | POA: Diagnosis not present

## 2018-12-22 DIAGNOSIS — Z8673 Personal history of transient ischemic attack (TIA), and cerebral infarction without residual deficits: Secondary | ICD-10-CM | POA: Diagnosis not present

## 2018-12-22 DIAGNOSIS — N135 Crossing vessel and stricture of ureter without hydronephrosis: Secondary | ICD-10-CM | POA: Diagnosis not present

## 2018-12-22 DIAGNOSIS — R531 Weakness: Secondary | ICD-10-CM | POA: Diagnosis not present

## 2018-12-22 DIAGNOSIS — I129 Hypertensive chronic kidney disease with stage 1 through stage 4 chronic kidney disease, or unspecified chronic kidney disease: Secondary | ICD-10-CM | POA: Diagnosis not present

## 2018-12-22 DIAGNOSIS — I639 Cerebral infarction, unspecified: Secondary | ICD-10-CM | POA: Diagnosis not present

## 2018-12-22 DIAGNOSIS — R29818 Other symptoms and signs involving the nervous system: Secondary | ICD-10-CM | POA: Diagnosis not present

## 2018-12-22 DIAGNOSIS — G309 Alzheimer's disease, unspecified: Secondary | ICD-10-CM | POA: Diagnosis not present

## 2018-12-22 DIAGNOSIS — K449 Diaphragmatic hernia without obstruction or gangrene: Secondary | ICD-10-CM | POA: Diagnosis not present

## 2018-12-22 DIAGNOSIS — G8191 Hemiplegia, unspecified affecting right dominant side: Secondary | ICD-10-CM | POA: Diagnosis not present

## 2018-12-22 DIAGNOSIS — E872 Acidosis: Secondary | ICD-10-CM | POA: Diagnosis not present

## 2018-12-22 DIAGNOSIS — R1011 Right upper quadrant pain: Secondary | ICD-10-CM | POA: Diagnosis not present

## 2018-12-22 DIAGNOSIS — K858 Other acute pancreatitis without necrosis or infection: Secondary | ICD-10-CM | POA: Diagnosis not present

## 2018-12-22 DIAGNOSIS — N10 Acute tubulo-interstitial nephritis: Secondary | ICD-10-CM | POA: Diagnosis not present

## 2018-12-22 DIAGNOSIS — K2970 Gastritis, unspecified, without bleeding: Secondary | ICD-10-CM | POA: Diagnosis not present

## 2018-12-22 DIAGNOSIS — N183 Chronic kidney disease, stage 3 (moderate): Secondary | ICD-10-CM | POA: Diagnosis not present

## 2018-12-22 DIAGNOSIS — E785 Hyperlipidemia, unspecified: Secondary | ICD-10-CM | POA: Diagnosis not present

## 2018-12-22 DIAGNOSIS — D62 Acute posthemorrhagic anemia: Secondary | ICD-10-CM | POA: Diagnosis not present

## 2018-12-22 DIAGNOSIS — I160 Hypertensive urgency: Secondary | ICD-10-CM | POA: Diagnosis not present

## 2018-12-22 DIAGNOSIS — D649 Anemia, unspecified: Secondary | ICD-10-CM | POA: Diagnosis not present

## 2018-12-22 DIAGNOSIS — I340 Nonrheumatic mitral (valve) insufficiency: Secondary | ICD-10-CM | POA: Diagnosis not present

## 2018-12-22 DIAGNOSIS — G300 Alzheimer's disease with early onset: Secondary | ICD-10-CM | POA: Diagnosis not present

## 2018-12-22 DIAGNOSIS — K921 Melena: Secondary | ICD-10-CM | POA: Diagnosis not present

## 2018-12-22 DIAGNOSIS — K859 Acute pancreatitis without necrosis or infection, unspecified: Secondary | ICD-10-CM | POA: Diagnosis not present

## 2018-12-22 DIAGNOSIS — R1010 Upper abdominal pain, unspecified: Secondary | ICD-10-CM | POA: Diagnosis not present

## 2018-12-27 DIAGNOSIS — Z01818 Encounter for other preprocedural examination: Secondary | ICD-10-CM

## 2018-12-27 DIAGNOSIS — K859 Acute pancreatitis without necrosis or infection, unspecified: Secondary | ICD-10-CM

## 2018-12-28 DIAGNOSIS — D649 Anemia, unspecified: Secondary | ICD-10-CM

## 2018-12-28 DIAGNOSIS — D696 Thrombocytopenia, unspecified: Secondary | ICD-10-CM

## 2018-12-28 DIAGNOSIS — D689 Coagulation defect, unspecified: Secondary | ICD-10-CM

## 2018-12-29 DIAGNOSIS — D696 Thrombocytopenia, unspecified: Secondary | ICD-10-CM

## 2018-12-29 DIAGNOSIS — D649 Anemia, unspecified: Secondary | ICD-10-CM

## 2018-12-29 DIAGNOSIS — D689 Coagulation defect, unspecified: Secondary | ICD-10-CM

## 2018-12-29 DIAGNOSIS — I340 Nonrheumatic mitral (valve) insufficiency: Secondary | ICD-10-CM

## 2019-01-01 DIAGNOSIS — D689 Coagulation defect, unspecified: Secondary | ICD-10-CM

## 2019-01-01 DIAGNOSIS — D696 Thrombocytopenia, unspecified: Secondary | ICD-10-CM

## 2019-01-01 DIAGNOSIS — D649 Anemia, unspecified: Secondary | ICD-10-CM

## 2019-01-05 DIAGNOSIS — I129 Hypertensive chronic kidney disease with stage 1 through stage 4 chronic kidney disease, or unspecified chronic kidney disease: Secondary | ICD-10-CM | POA: Diagnosis not present

## 2019-01-05 DIAGNOSIS — D689 Coagulation defect, unspecified: Secondary | ICD-10-CM | POA: Diagnosis not present

## 2019-01-05 DIAGNOSIS — G300 Alzheimer's disease with early onset: Secondary | ICD-10-CM | POA: Diagnosis not present

## 2019-01-05 DIAGNOSIS — Z87440 Personal history of urinary (tract) infections: Secondary | ICD-10-CM | POA: Diagnosis not present

## 2019-01-05 DIAGNOSIS — I739 Peripheral vascular disease, unspecified: Secondary | ICD-10-CM | POA: Diagnosis not present

## 2019-01-05 DIAGNOSIS — M810 Age-related osteoporosis without current pathological fracture: Secondary | ICD-10-CM | POA: Diagnosis not present

## 2019-01-05 DIAGNOSIS — Z79891 Long term (current) use of opiate analgesic: Secondary | ICD-10-CM | POA: Diagnosis not present

## 2019-01-05 DIAGNOSIS — Z9181 History of falling: Secondary | ICD-10-CM | POA: Diagnosis not present

## 2019-01-05 DIAGNOSIS — J439 Emphysema, unspecified: Secondary | ICD-10-CM | POA: Diagnosis not present

## 2019-01-05 DIAGNOSIS — R339 Retention of urine, unspecified: Secondary | ICD-10-CM | POA: Diagnosis not present

## 2019-01-05 DIAGNOSIS — I7774 Dissection of vertebral artery: Secondary | ICD-10-CM | POA: Diagnosis not present

## 2019-01-05 DIAGNOSIS — Z8673 Personal history of transient ischemic attack (TIA), and cerebral infarction without residual deficits: Secondary | ICD-10-CM | POA: Diagnosis not present

## 2019-01-05 DIAGNOSIS — Z466 Encounter for fitting and adjustment of urinary device: Secondary | ICD-10-CM | POA: Diagnosis not present

## 2019-01-05 DIAGNOSIS — E43 Unspecified severe protein-calorie malnutrition: Secondary | ICD-10-CM | POA: Diagnosis not present

## 2019-01-05 DIAGNOSIS — N319 Neuromuscular dysfunction of bladder, unspecified: Secondary | ICD-10-CM | POA: Diagnosis not present

## 2019-01-05 DIAGNOSIS — N183 Chronic kidney disease, stage 3 (moderate): Secondary | ICD-10-CM | POA: Diagnosis not present

## 2019-01-05 DIAGNOSIS — D696 Thrombocytopenia, unspecified: Secondary | ICD-10-CM | POA: Diagnosis not present

## 2019-01-05 DIAGNOSIS — K219 Gastro-esophageal reflux disease without esophagitis: Secondary | ICD-10-CM | POA: Diagnosis not present

## 2019-01-05 DIAGNOSIS — N135 Crossing vessel and stricture of ureter without hydronephrosis: Secondary | ICD-10-CM | POA: Diagnosis not present

## 2019-01-05 DIAGNOSIS — D631 Anemia in chronic kidney disease: Secondary | ICD-10-CM | POA: Diagnosis not present

## 2019-01-06 DIAGNOSIS — Z466 Encounter for fitting and adjustment of urinary device: Secondary | ICD-10-CM | POA: Diagnosis not present

## 2019-01-06 DIAGNOSIS — K219 Gastro-esophageal reflux disease without esophagitis: Secondary | ICD-10-CM | POA: Diagnosis not present

## 2019-01-06 DIAGNOSIS — J439 Emphysema, unspecified: Secondary | ICD-10-CM | POA: Diagnosis not present

## 2019-01-06 DIAGNOSIS — I129 Hypertensive chronic kidney disease with stage 1 through stage 4 chronic kidney disease, or unspecified chronic kidney disease: Secondary | ICD-10-CM | POA: Diagnosis not present

## 2019-01-06 DIAGNOSIS — Z87440 Personal history of urinary (tract) infections: Secondary | ICD-10-CM | POA: Diagnosis not present

## 2019-01-06 DIAGNOSIS — N183 Chronic kidney disease, stage 3 (moderate): Secondary | ICD-10-CM | POA: Diagnosis not present

## 2019-01-06 DIAGNOSIS — E43 Unspecified severe protein-calorie malnutrition: Secondary | ICD-10-CM | POA: Diagnosis not present

## 2019-01-06 DIAGNOSIS — D689 Coagulation defect, unspecified: Secondary | ICD-10-CM | POA: Diagnosis not present

## 2019-01-06 DIAGNOSIS — I7774 Dissection of vertebral artery: Secondary | ICD-10-CM | POA: Diagnosis not present

## 2019-01-06 DIAGNOSIS — G300 Alzheimer's disease with early onset: Secondary | ICD-10-CM | POA: Diagnosis not present

## 2019-01-06 DIAGNOSIS — Z79891 Long term (current) use of opiate analgesic: Secondary | ICD-10-CM | POA: Diagnosis not present

## 2019-01-06 DIAGNOSIS — Z8673 Personal history of transient ischemic attack (TIA), and cerebral infarction without residual deficits: Secondary | ICD-10-CM | POA: Diagnosis not present

## 2019-01-06 DIAGNOSIS — Z9181 History of falling: Secondary | ICD-10-CM | POA: Diagnosis not present

## 2019-01-06 DIAGNOSIS — I739 Peripheral vascular disease, unspecified: Secondary | ICD-10-CM | POA: Diagnosis not present

## 2019-01-06 DIAGNOSIS — D696 Thrombocytopenia, unspecified: Secondary | ICD-10-CM | POA: Diagnosis not present

## 2019-01-06 DIAGNOSIS — D631 Anemia in chronic kidney disease: Secondary | ICD-10-CM | POA: Diagnosis not present

## 2019-01-06 DIAGNOSIS — N319 Neuromuscular dysfunction of bladder, unspecified: Secondary | ICD-10-CM | POA: Diagnosis not present

## 2019-01-06 DIAGNOSIS — M810 Age-related osteoporosis without current pathological fracture: Secondary | ICD-10-CM | POA: Diagnosis not present

## 2019-01-09 DIAGNOSIS — Z79899 Other long term (current) drug therapy: Secondary | ICD-10-CM | POA: Diagnosis not present

## 2019-01-09 DIAGNOSIS — G4700 Insomnia, unspecified: Secondary | ICD-10-CM | POA: Diagnosis not present

## 2019-01-09 DIAGNOSIS — J449 Chronic obstructive pulmonary disease, unspecified: Secondary | ICD-10-CM | POA: Diagnosis not present

## 2019-01-09 DIAGNOSIS — I10 Essential (primary) hypertension: Secondary | ICD-10-CM | POA: Diagnosis not present

## 2019-01-09 DIAGNOSIS — K859 Acute pancreatitis without necrosis or infection, unspecified: Secondary | ICD-10-CM | POA: Diagnosis not present

## 2019-01-16 DIAGNOSIS — Z9181 History of falling: Secondary | ICD-10-CM | POA: Diagnosis not present

## 2019-01-16 DIAGNOSIS — I739 Peripheral vascular disease, unspecified: Secondary | ICD-10-CM | POA: Diagnosis not present

## 2019-01-16 DIAGNOSIS — I129 Hypertensive chronic kidney disease with stage 1 through stage 4 chronic kidney disease, or unspecified chronic kidney disease: Secondary | ICD-10-CM | POA: Diagnosis not present

## 2019-01-16 DIAGNOSIS — D696 Thrombocytopenia, unspecified: Secondary | ICD-10-CM | POA: Diagnosis not present

## 2019-01-16 DIAGNOSIS — I7774 Dissection of vertebral artery: Secondary | ICD-10-CM | POA: Diagnosis not present

## 2019-01-16 DIAGNOSIS — D631 Anemia in chronic kidney disease: Secondary | ICD-10-CM | POA: Diagnosis not present

## 2019-01-16 DIAGNOSIS — Z8673 Personal history of transient ischemic attack (TIA), and cerebral infarction without residual deficits: Secondary | ICD-10-CM | POA: Diagnosis not present

## 2019-01-16 DIAGNOSIS — N183 Chronic kidney disease, stage 3 (moderate): Secondary | ICD-10-CM | POA: Diagnosis not present

## 2019-01-16 DIAGNOSIS — J439 Emphysema, unspecified: Secondary | ICD-10-CM | POA: Diagnosis not present

## 2019-01-16 DIAGNOSIS — M810 Age-related osteoporosis without current pathological fracture: Secondary | ICD-10-CM | POA: Diagnosis not present

## 2019-01-16 DIAGNOSIS — N319 Neuromuscular dysfunction of bladder, unspecified: Secondary | ICD-10-CM | POA: Diagnosis not present

## 2019-01-16 DIAGNOSIS — Z79891 Long term (current) use of opiate analgesic: Secondary | ICD-10-CM | POA: Diagnosis not present

## 2019-01-16 DIAGNOSIS — D689 Coagulation defect, unspecified: Secondary | ICD-10-CM | POA: Diagnosis not present

## 2019-01-16 DIAGNOSIS — Z466 Encounter for fitting and adjustment of urinary device: Secondary | ICD-10-CM | POA: Diagnosis not present

## 2019-01-16 DIAGNOSIS — K219 Gastro-esophageal reflux disease without esophagitis: Secondary | ICD-10-CM | POA: Diagnosis not present

## 2019-01-16 DIAGNOSIS — G300 Alzheimer's disease with early onset: Secondary | ICD-10-CM | POA: Diagnosis not present

## 2019-01-16 DIAGNOSIS — E43 Unspecified severe protein-calorie malnutrition: Secondary | ICD-10-CM | POA: Diagnosis not present

## 2019-01-16 DIAGNOSIS — Z87440 Personal history of urinary (tract) infections: Secondary | ICD-10-CM | POA: Diagnosis not present

## 2019-01-20 DIAGNOSIS — I7774 Dissection of vertebral artery: Secondary | ICD-10-CM | POA: Diagnosis not present

## 2019-01-20 DIAGNOSIS — I739 Peripheral vascular disease, unspecified: Secondary | ICD-10-CM | POA: Diagnosis not present

## 2019-01-20 DIAGNOSIS — E43 Unspecified severe protein-calorie malnutrition: Secondary | ICD-10-CM | POA: Diagnosis not present

## 2019-01-20 DIAGNOSIS — D696 Thrombocytopenia, unspecified: Secondary | ICD-10-CM | POA: Diagnosis not present

## 2019-01-20 DIAGNOSIS — M810 Age-related osteoporosis without current pathological fracture: Secondary | ICD-10-CM | POA: Diagnosis not present

## 2019-01-20 DIAGNOSIS — Z79891 Long term (current) use of opiate analgesic: Secondary | ICD-10-CM | POA: Diagnosis not present

## 2019-01-20 DIAGNOSIS — J439 Emphysema, unspecified: Secondary | ICD-10-CM | POA: Diagnosis not present

## 2019-01-20 DIAGNOSIS — Z8673 Personal history of transient ischemic attack (TIA), and cerebral infarction without residual deficits: Secondary | ICD-10-CM | POA: Diagnosis not present

## 2019-01-20 DIAGNOSIS — D689 Coagulation defect, unspecified: Secondary | ICD-10-CM | POA: Diagnosis not present

## 2019-01-20 DIAGNOSIS — Z466 Encounter for fitting and adjustment of urinary device: Secondary | ICD-10-CM | POA: Diagnosis not present

## 2019-01-20 DIAGNOSIS — N183 Chronic kidney disease, stage 3 (moderate): Secondary | ICD-10-CM | POA: Diagnosis not present

## 2019-01-20 DIAGNOSIS — Z87440 Personal history of urinary (tract) infections: Secondary | ICD-10-CM | POA: Diagnosis not present

## 2019-01-20 DIAGNOSIS — N319 Neuromuscular dysfunction of bladder, unspecified: Secondary | ICD-10-CM | POA: Diagnosis not present

## 2019-01-20 DIAGNOSIS — I129 Hypertensive chronic kidney disease with stage 1 through stage 4 chronic kidney disease, or unspecified chronic kidney disease: Secondary | ICD-10-CM | POA: Diagnosis not present

## 2019-01-20 DIAGNOSIS — D631 Anemia in chronic kidney disease: Secondary | ICD-10-CM | POA: Diagnosis not present

## 2019-01-20 DIAGNOSIS — Z9181 History of falling: Secondary | ICD-10-CM | POA: Diagnosis not present

## 2019-01-20 DIAGNOSIS — K219 Gastro-esophageal reflux disease without esophagitis: Secondary | ICD-10-CM | POA: Diagnosis not present

## 2019-01-20 DIAGNOSIS — G300 Alzheimer's disease with early onset: Secondary | ICD-10-CM | POA: Diagnosis not present

## 2019-01-25 DIAGNOSIS — S0003XA Contusion of scalp, initial encounter: Secondary | ICD-10-CM | POA: Diagnosis not present

## 2019-01-25 DIAGNOSIS — S99911A Unspecified injury of right ankle, initial encounter: Secondary | ICD-10-CM | POA: Diagnosis not present

## 2019-01-25 DIAGNOSIS — I10 Essential (primary) hypertension: Secondary | ICD-10-CM | POA: Diagnosis not present

## 2019-01-25 DIAGNOSIS — R51 Headache: Secondary | ICD-10-CM | POA: Diagnosis not present

## 2019-01-25 DIAGNOSIS — W19XXXA Unspecified fall, initial encounter: Secondary | ICD-10-CM | POA: Diagnosis not present

## 2019-01-25 DIAGNOSIS — M25572 Pain in left ankle and joints of left foot: Secondary | ICD-10-CM | POA: Diagnosis not present

## 2019-01-25 DIAGNOSIS — R911 Solitary pulmonary nodule: Secondary | ICD-10-CM | POA: Diagnosis not present

## 2019-01-25 DIAGNOSIS — M7989 Other specified soft tissue disorders: Secondary | ICD-10-CM | POA: Diagnosis not present

## 2019-01-25 DIAGNOSIS — M542 Cervicalgia: Secondary | ICD-10-CM | POA: Diagnosis not present

## 2019-01-25 DIAGNOSIS — S93409A Sprain of unspecified ligament of unspecified ankle, initial encounter: Secondary | ICD-10-CM | POA: Diagnosis not present

## 2019-01-25 DIAGNOSIS — S0990XA Unspecified injury of head, initial encounter: Secondary | ICD-10-CM | POA: Diagnosis not present

## 2019-01-25 DIAGNOSIS — M25571 Pain in right ankle and joints of right foot: Secondary | ICD-10-CM | POA: Diagnosis not present

## 2019-01-28 ENCOUNTER — Other Ambulatory Visit: Payer: Self-pay

## 2019-01-28 DIAGNOSIS — Z8673 Personal history of transient ischemic attack (TIA), and cerebral infarction without residual deficits: Secondary | ICD-10-CM | POA: Diagnosis not present

## 2019-01-28 DIAGNOSIS — D631 Anemia in chronic kidney disease: Secondary | ICD-10-CM | POA: Diagnosis not present

## 2019-01-28 DIAGNOSIS — Z466 Encounter for fitting and adjustment of urinary device: Secondary | ICD-10-CM | POA: Diagnosis not present

## 2019-01-28 DIAGNOSIS — I7774 Dissection of vertebral artery: Secondary | ICD-10-CM | POA: Diagnosis not present

## 2019-01-28 DIAGNOSIS — D689 Coagulation defect, unspecified: Secondary | ICD-10-CM | POA: Diagnosis not present

## 2019-01-28 DIAGNOSIS — N183 Chronic kidney disease, stage 3 (moderate): Secondary | ICD-10-CM | POA: Diagnosis not present

## 2019-01-28 DIAGNOSIS — N390 Urinary tract infection, site not specified: Secondary | ICD-10-CM | POA: Diagnosis not present

## 2019-01-28 DIAGNOSIS — I129 Hypertensive chronic kidney disease with stage 1 through stage 4 chronic kidney disease, or unspecified chronic kidney disease: Secondary | ICD-10-CM | POA: Diagnosis not present

## 2019-01-28 DIAGNOSIS — G300 Alzheimer's disease with early onset: Secondary | ICD-10-CM | POA: Diagnosis not present

## 2019-01-28 DIAGNOSIS — Z79891 Long term (current) use of opiate analgesic: Secondary | ICD-10-CM | POA: Diagnosis not present

## 2019-01-28 DIAGNOSIS — N319 Neuromuscular dysfunction of bladder, unspecified: Secondary | ICD-10-CM | POA: Diagnosis not present

## 2019-01-28 DIAGNOSIS — K219 Gastro-esophageal reflux disease without esophagitis: Secondary | ICD-10-CM | POA: Diagnosis not present

## 2019-01-28 DIAGNOSIS — N135 Crossing vessel and stricture of ureter without hydronephrosis: Secondary | ICD-10-CM | POA: Diagnosis not present

## 2019-01-28 DIAGNOSIS — I739 Peripheral vascular disease, unspecified: Secondary | ICD-10-CM | POA: Diagnosis not present

## 2019-01-28 DIAGNOSIS — J439 Emphysema, unspecified: Secondary | ICD-10-CM | POA: Diagnosis not present

## 2019-01-28 DIAGNOSIS — Z9181 History of falling: Secondary | ICD-10-CM | POA: Diagnosis not present

## 2019-01-28 DIAGNOSIS — E43 Unspecified severe protein-calorie malnutrition: Secondary | ICD-10-CM | POA: Diagnosis not present

## 2019-01-28 DIAGNOSIS — M810 Age-related osteoporosis without current pathological fracture: Secondary | ICD-10-CM | POA: Diagnosis not present

## 2019-01-28 DIAGNOSIS — D696 Thrombocytopenia, unspecified: Secondary | ICD-10-CM | POA: Diagnosis not present

## 2019-01-28 DIAGNOSIS — Z87440 Personal history of urinary (tract) infections: Secondary | ICD-10-CM | POA: Diagnosis not present

## 2019-01-28 NOTE — Patient Outreach (Signed)
[place] [facility]) Care Management  01/28/2019  [name] 1951-04-10 [id number]   Telephone Screen  Referral Date: 01/28/2019 Referral Source: [facility]. Referral Reason: "unable to afford Anoro inhaler" Insurance: [facility] Medicare   Outreach attempt # 1 to patient. No answer at present and unable to leave message.      Plan: RN CM will make outreach attempt to patient within 3-4 business days. RN CM will send unsuccessful outreach letter to patient.   Enzo Montgomery, RN,BSN,CCM [place] Management Telephonic Care Management Coordinator Direct Phone: [phone] Toll Free: [phone] Fax: [phone]

## 2019-01-29 ENCOUNTER — Telehealth: Payer: Self-pay | Admitting: Pharmacist

## 2019-01-29 ENCOUNTER — Other Ambulatory Visit: Payer: Self-pay

## 2019-01-29 NOTE — Patient Outreach (Signed)
[place] [facility]) Care Management  01/29/2019  [name] March 22, 1951 [id number]   Patient was called regarding medication assistance with Anoro. Unfortunately, she did not answer the phone. HIPAA compliant message could not be left. Her phone rang >4 times and did not allow a message to be left.  Plan: Send patient an unsuccessful outreach letter Call patient back in 5-7 business days.   [name], PharmD, [place] Clinical Pharmacist [phone]

## 2019-01-29 NOTE — Patient Outreach (Signed)
[place] [facility]) Care Management  01/29/2019  [name] 02-28-1951 [id number]   Telephone Screen  Referral Date: 01/28/2019 Referral Source: [facility] Dept. Referral Reason: "unable to afford Anoro inhaler" Insurance: [facility] Medicare   Outreach attempt #2 to patient.Spoke with patient briefly as she did not wish to talk long. She reports that she is unable to afford her inhaler. She then placed her son on the phone and stated he knew more about it. Son reported that patient has not used inhaler for several months due to inability to afford it. He thinks it costs about $80/month. He states that the one time patient got it she had a coupon that she was able to use. They have not looked into samples from MD and unsure if MD aware patient not taking med. RN CM encouraged them to do so. Son states patient taking about 10 meds and he helps her manage them. RN CM attempted to complete remainder of screening assessment but both patient and son reported they didn't need anything else except med assistance. Verbal consent for [facility] services given by patient and son.     Plan: RN CM will send [facility] pharmacy referral for possible med assistance.  [name] Montgomery, RN,BSN,CCM [place] Management Telephonic Care Management Coordinator Direct Phone: [phone] Toll Free: [phone] Fax: [phone]

## 2019-02-03 DIAGNOSIS — I10 Essential (primary) hypertension: Secondary | ICD-10-CM | POA: Diagnosis not present

## 2019-02-03 DIAGNOSIS — Z86718 Personal history of other venous thrombosis and embolism: Secondary | ICD-10-CM | POA: Diagnosis not present

## 2019-02-03 DIAGNOSIS — Z7902 Long term (current) use of antithrombotics/antiplatelets: Secondary | ICD-10-CM | POA: Diagnosis not present

## 2019-02-03 DIAGNOSIS — Z79899 Other long term (current) drug therapy: Secondary | ICD-10-CM | POA: Diagnosis not present

## 2019-02-03 DIAGNOSIS — N319 Neuromuscular dysfunction of bladder, unspecified: Secondary | ICD-10-CM | POA: Diagnosis not present

## 2019-02-03 DIAGNOSIS — E7800 Pure hypercholesterolemia, unspecified: Secondary | ICD-10-CM | POA: Diagnosis not present

## 2019-02-03 DIAGNOSIS — Z8673 Personal history of transient ischemic attack (TIA), and cerebral infarction without residual deficits: Secondary | ICD-10-CM | POA: Diagnosis not present

## 2019-02-03 DIAGNOSIS — N131 Hydronephrosis with ureteral stricture, not elsewhere classified: Secondary | ICD-10-CM | POA: Diagnosis not present

## 2019-02-03 DIAGNOSIS — N3289 Other specified disorders of bladder: Secondary | ICD-10-CM | POA: Diagnosis not present

## 2019-02-04 IMAGING — DX DG CHEST 2V
2 series · 2 of 2 positions shown · non-contrast
Comparison: 09/17/2018 CXR

CLINICAL DATA: Cholecystitis

EXAM:
CHEST - 2 VIEW

[w chest pa]
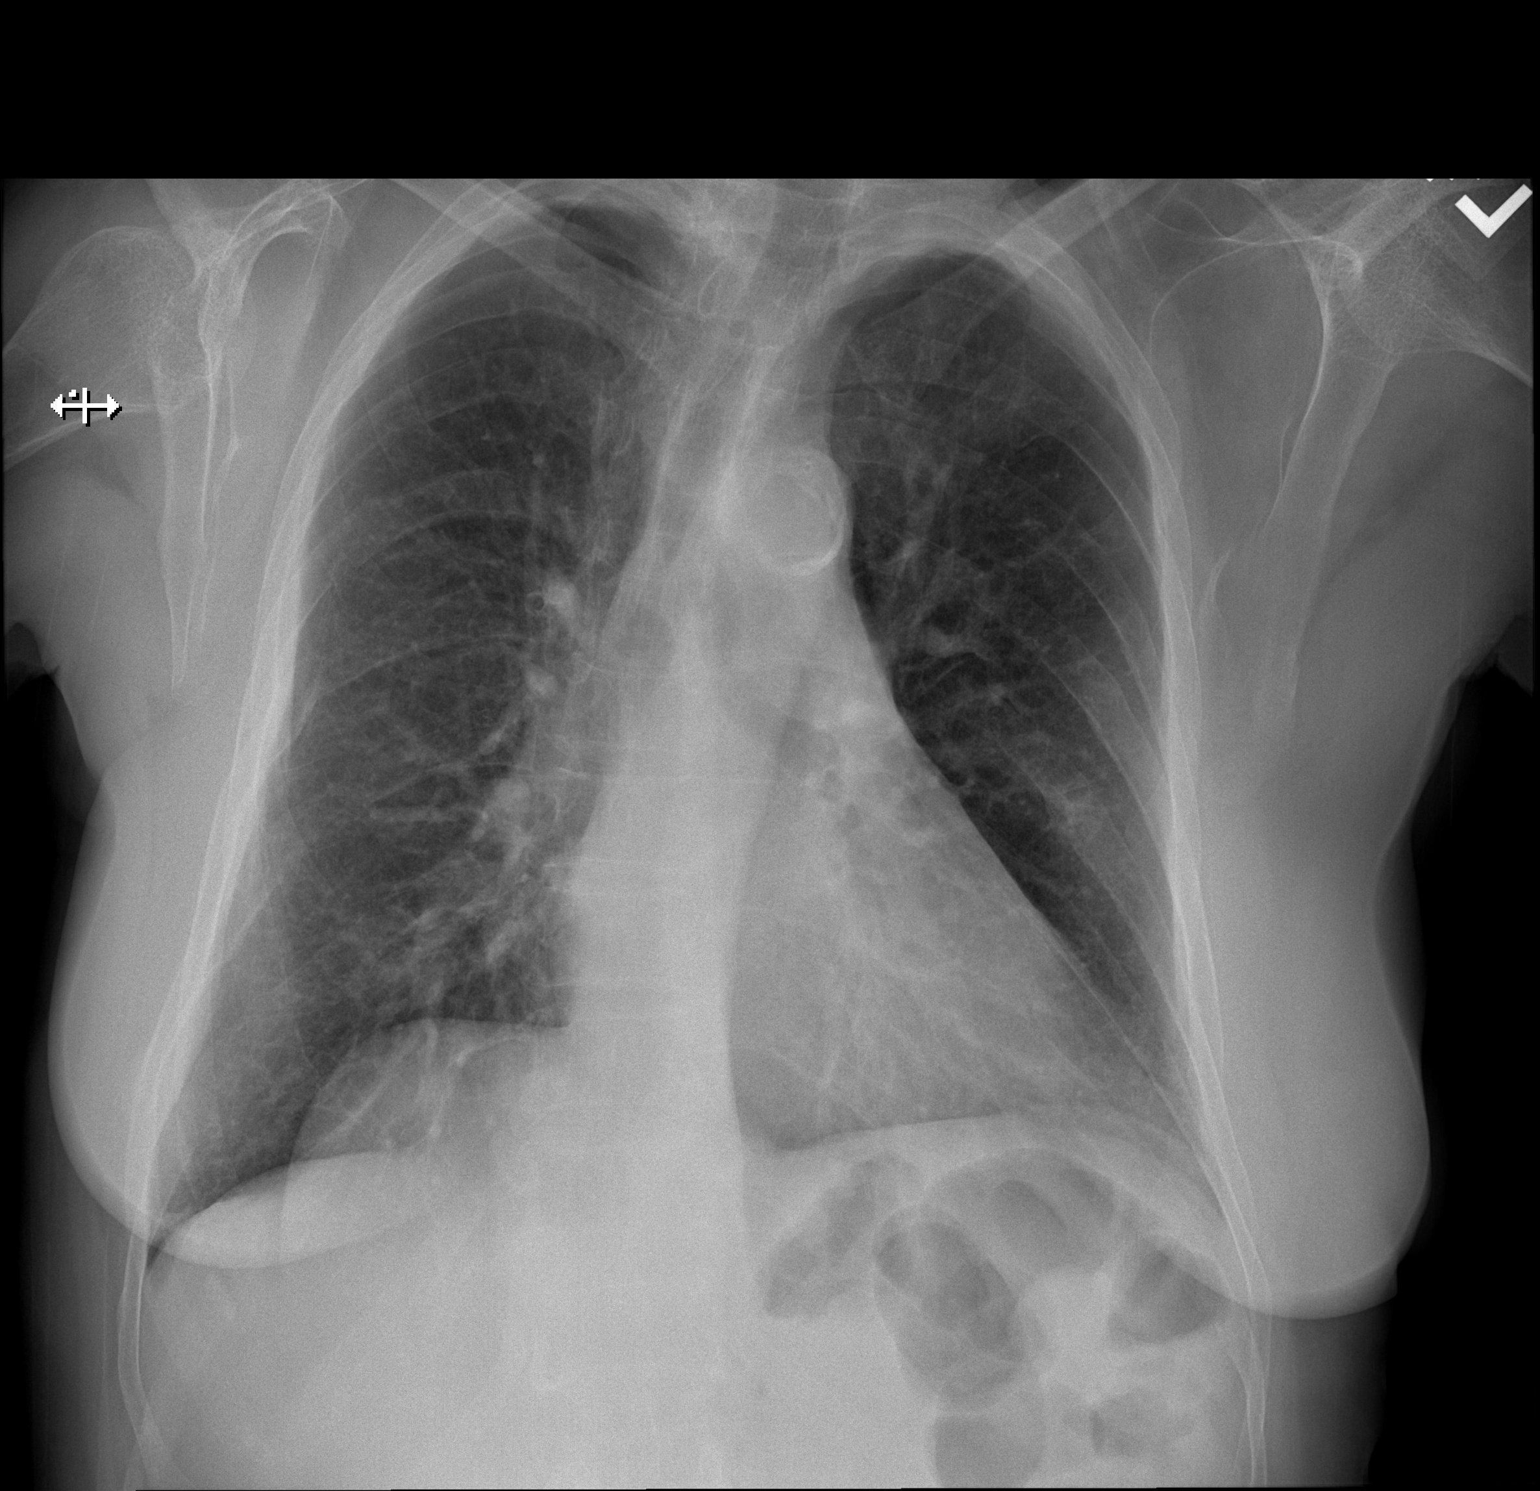

[w chest lat]
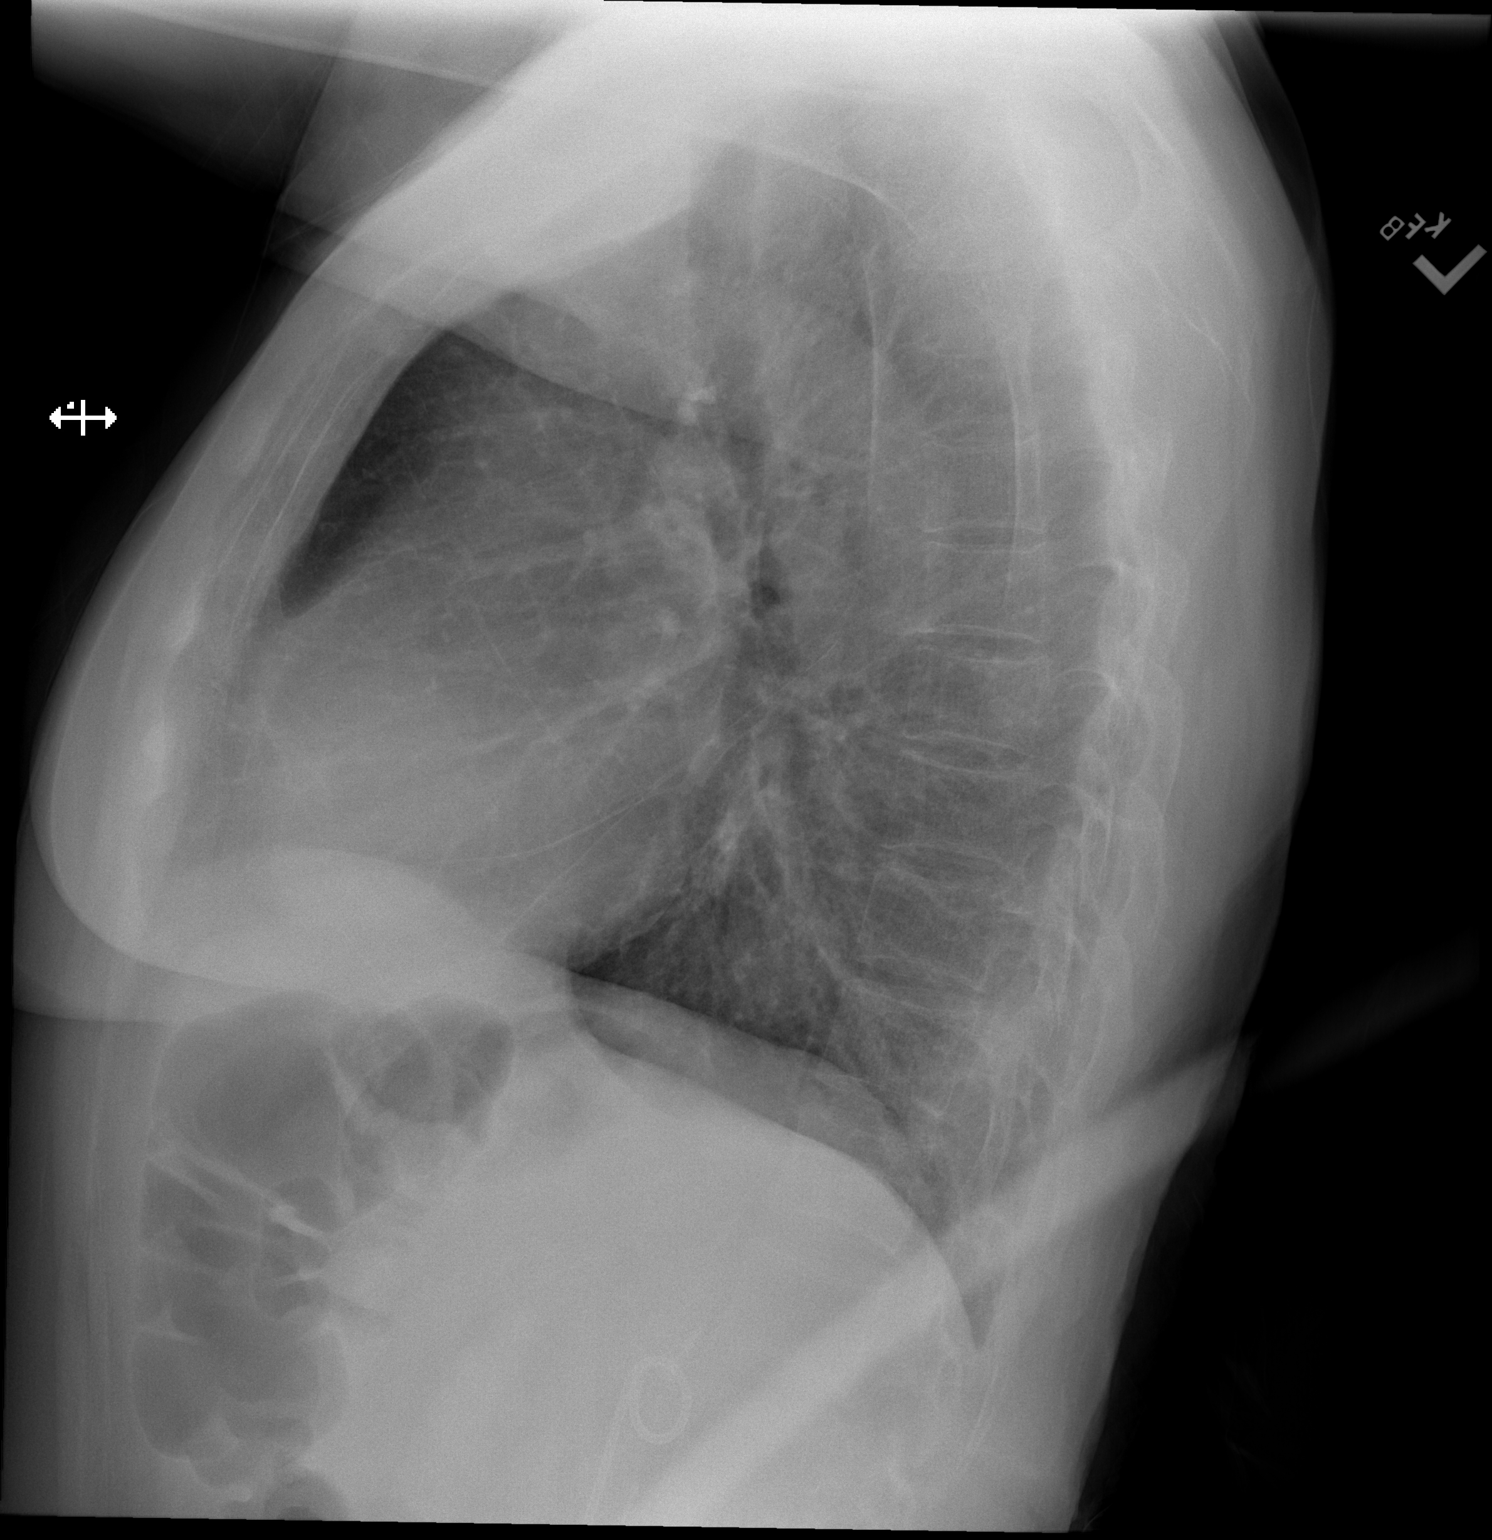

[2 of 2 positions shown; findings below may reference images not displayed]

FINDINGS: Top-normal heart size. Moderate aortic atherosclerosis. No pulmonary
consolidation to suggest pneumonia. Minimal scarring or atelectasis
in the left mid lung. Remote right-sided rib fractures. No acute
osseous abnormality. There is mild dextroconvex curvature of the
thoracic spine. Pigtail catheter is seen on the lateral view below
the diaphragm.
IMPRESSION: No active cardiopulmonary disease. Aortic atherosclerosis.

## 2019-02-05 IMAGING — RF DG ERCP WO/W SPHINCTEROTOMY
1 series · 2 of 2 positions shown · non-contrast
Comparison: 10/23/2018

CLINICAL DATA: Choledocholithiasis

EXAM:
ERCP with sphincterotomy and stone removal
TECHNIQUE: Multiple spot images obtained with the fluoroscopic device and
submitted for interpretation post-procedure.
FLUOROSCOPY TIME:  Fluoroscopy Time:  7 minutes 33 seconds
Radiation Exposure Index (if provided by the fluoroscopic device):
None available
Number of Acquired Spot Images: 2

[Series 1: run · 2 of 2 slices shown]
[im 1/2]
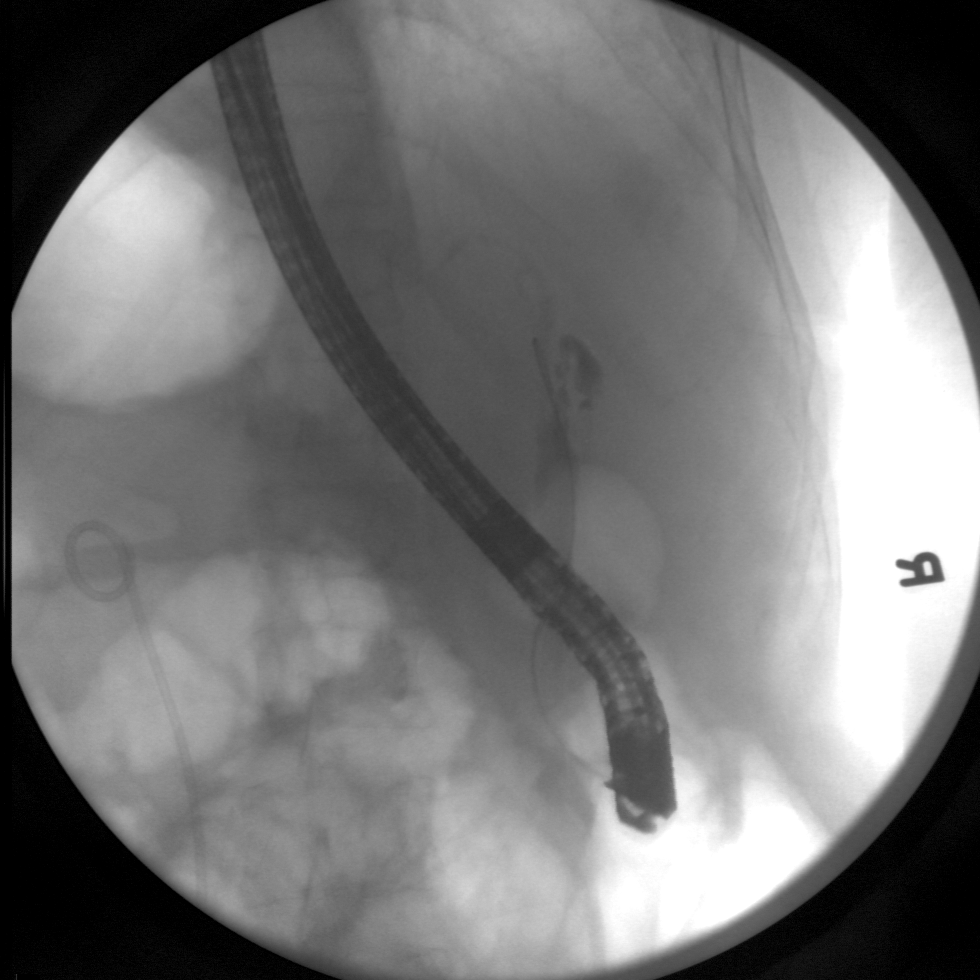
[im 2/2]
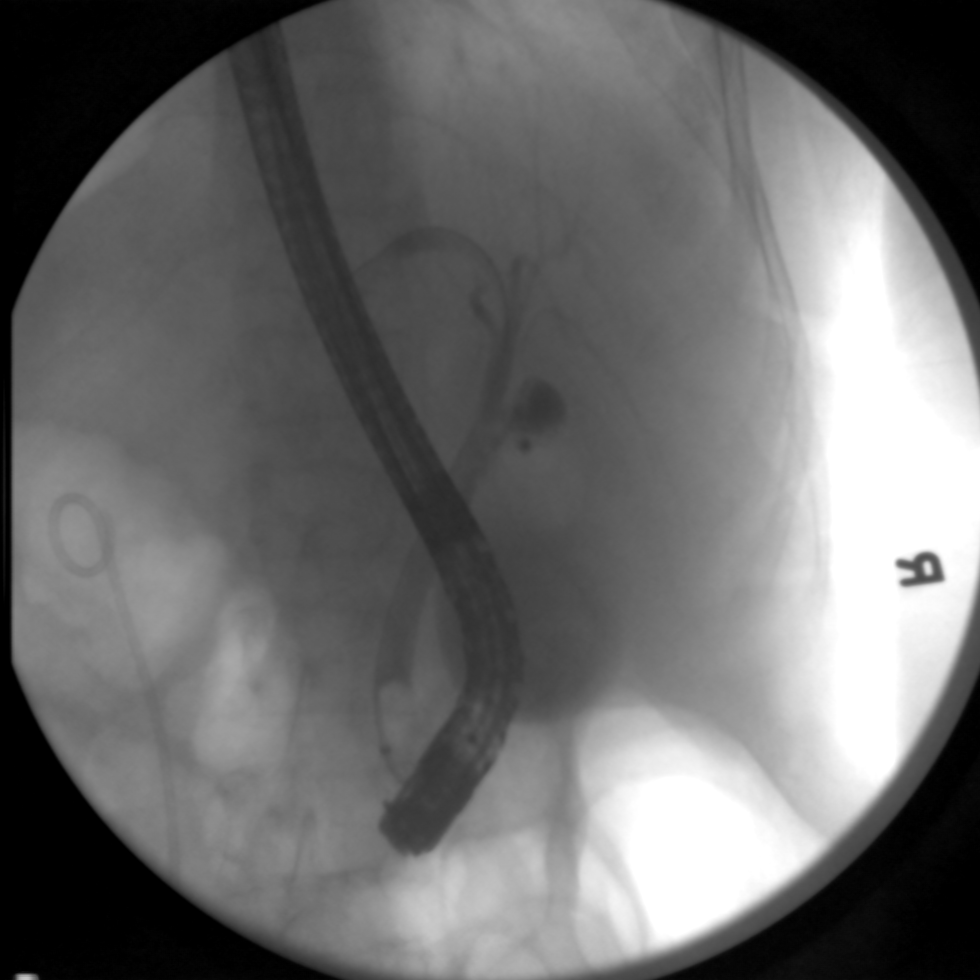

[2 of 2 positions shown; findings below may reference images not displayed]

FINDINGS: Two spot fluoroscopic views during the ERCP. Limited opacification
biliary tree. Sphincterotomy and balloon sweep performed. Please
refer to the ERCP report for full details of the procedure. No gross
biliary obstruction.
IMPRESSION: Limited imaging during ERCP with sphincterotomy and balloon sweep.
No biliary obstruction.

These images were submitted for radiologic interpretation only.
Please see the procedural report for the amount of contrast and the
fluoroscopy time utilized.

## 2019-02-09 ENCOUNTER — Other Ambulatory Visit: Payer: Self-pay | Admitting: Pharmacist

## 2019-02-09 NOTE — Patient Outreach (Addendum)
[place] [facility]) Care Management  [place]   02/09/2019  [name] [birth year] [id number]  Reason for referral: Medication Assistance with Anoro  Referral source: [facility] RN Current insurance:[employer]  PMHx includes but not limited to:    Alzheimer's disease, COPD, Esophageal stricture, hypertension, hyperlipidemia, headache, seizures, and history of thromboembolism of lower extremity artery.  Outreach:  Successful telephone call with patient and her caregiver.  HIPAA identifiers verified.   Subjective:  Patient is a 68 year old female with the medical conditions listed above. Sh e   Objective: Lab Results  Component Value Date   CREATININE 1.07 (H) 10/31/2018   CREATININE 1.06 (H) 10/30/2018   CREATININE 1.21 (H) 10/28/2018    No results found for: HGBA1C  Lipid Panel     Component Value Date/Time   TRIG 224 (H) 11/23/2017 0612    BP Readings from Last 3 Encounters:  10/31/18 (!) 160/66  10/13/18 (!) 160/78  06/09/18 (!) 183/80    Allergies  Allergen Reactions  . Codeine Nausea And Vomiting    REACTION: intolerance  . Latex Other (See Comments)    itching    Medications Reviewed Today    Reviewed by [name], [facility] (Pharmacist) on 02/09/19 at 1124  Med List Status: <None>  Medication Order Taking? Sig Documenting Provider Last Dose Status Informant  amLODipine (NORVASC) 10 MG tablet [id number] Yes Take 1 tablet (10 mg total) by mouth every evening. Kayleen Memos, DO Taking Active   atorvastatin (LIPITOR) 20 MG tablet [id number] Yes Take 20 mg by mouth daily. [provider] Taking Active   carvedilol (COREG) 6.25 MG tablet [id number] Yes Take 6.25 mg by mouth 2 (two) times daily. [provider] Taking Active   cyclobenzaprine (FLEXERIL) 5 MG tablet [id number] Yes Take 5 mg by mouth every 8 (eight) hours. [provider] Taking Active   diclofenac (VOLTAREN) 50 MG EC tablet [id number] Yes Take 50 mg by  mouth 3 (three) times daily. [provider] Taking Active   diclofenac sodium (VOLTAREN) 1 % GEL [id number] Yes Apply 2 g topically 3 (three) times daily. Kayleen Memos, DO Taking Active   donepezil (ARICEPT) 10 MG tablet [id number] Yes TAKE 1 BY MOUTH AT BEDTIME Ward Givens, NP Taking Active   doxazosin (CARDURA) 2 MG tablet [id number] Yes Take 2 mg by mouth daily. [provider] Taking Active Child  ferrous sulfate 325 (65 FE) MG tablet [id number] Yes Take 1 tablet (325 mg total) by mouth 2 (two) times daily with a meal. Kayleen Memos, DO Taking Active   levETIRAcetam (KEPPRA) 500 MG tablet [id number] Yes Take 1 tablet by mouth 2 (two) times daily. [provider] Taking Active   mirtazapine (REMERON) 30 MG tablet [id number] Yes Take 1 tablet by mouth at bedtime. [provider] Taking Active   pantoprazole (PROTONIX) 40 MG tablet [id number] Yes Take 1 tablet by mouth daily. [provider] Taking Active   phenytoin (DILANTIN) 100 MG ER capsule [id number] Yes Take 2 capsules (200 mg total) by mouth at bedtime. Ward Givens, NP Taking Active Child  sodium bicarbonate 650 MG tablet [id number] Yes Take 1 tablet (650 mg total) by mouth 3 (three) times daily.  Patient taking differently:  Take 650 mg by mouth 2 (two) times daily.    Kayleen Memos, DO Taking Active   umeclidinium-vilanterol (ANORO ELLIPTA) 62.5-25 MCG/INH AEPB [id number] No Inhale 1 puff into the lungs daily as needed (shortness of breath  or wheezing). [provider] Not Taking Active Child           Med Note Corinna Lines Feb 09, 2019 11:24 AM) Due to cost        Discontinued 02/09/19 1124 (Discontinued by provider)           Assessment:  Drugs sorted by system:  Neurologic/Psychologic: Donepezil, Levetiracetam, Mirtazapine, Phenytoin,    Cardiovascular: Amlodipine, Atorvastatin, Carvedilol,  Doxazosin  Pulmonary/Allergy: Anoro  Gastrointestinal: Pantoprazole, Sodium Bicarbonate Endocrine  Topical: Diclofenac gel  Pain: Diclofenac  Vitamins/Minerals/Supplements: Ferrous sulfate  Medication Assistance Findings:  Medication assistance needs identified.    Patient has [place] Medicare Complete Plan 2  Pharmacy Deductible $95 for tiers 3-5   Copay after deductible $47  Patient has not met her deductible that is why the copay was so much when she went to the pharmacy.  Patient said she was unable to pay for Anoro as it was >$100   Extra Help:   []  Already receiving Full Extra Help  []  Already receiving Partial Extra Help  []  Eligible based on reported income and assets  [x]  Not Eligible based on reported income and assets  Patient Assistance Programs: 1) Anoro made by [employer] o Income requirement met: [x]  Yes []  No []  Unknown o Out-of-pocket prescription expenditure met:    []  Yes [x]  No  []  Unknown  []  Not applicable - Alternative option is to switch to [employer] (made by [employer]).          Plan: Will route note to Dr. Nyra Capes requesting therapeutic switch to [facility]..   Follow up on the switch in 2-3 business days. If Dr. Nyra Capes approves the switch--send applications to her office via fax.  [name], PharmD, [place] Clinical Pharmacist [phone]

## 2019-02-10 DIAGNOSIS — D696 Thrombocytopenia, unspecified: Secondary | ICD-10-CM | POA: Diagnosis not present

## 2019-02-10 DIAGNOSIS — D631 Anemia in chronic kidney disease: Secondary | ICD-10-CM | POA: Diagnosis not present

## 2019-02-10 DIAGNOSIS — J439 Emphysema, unspecified: Secondary | ICD-10-CM | POA: Diagnosis not present

## 2019-02-10 DIAGNOSIS — Z9181 History of falling: Secondary | ICD-10-CM | POA: Diagnosis not present

## 2019-02-10 DIAGNOSIS — N319 Neuromuscular dysfunction of bladder, unspecified: Secondary | ICD-10-CM | POA: Diagnosis not present

## 2019-02-10 DIAGNOSIS — N390 Urinary tract infection, site not specified: Secondary | ICD-10-CM | POA: Diagnosis not present

## 2019-02-10 DIAGNOSIS — M810 Age-related osteoporosis without current pathological fracture: Secondary | ICD-10-CM | POA: Diagnosis not present

## 2019-02-10 DIAGNOSIS — E43 Unspecified severe protein-calorie malnutrition: Secondary | ICD-10-CM | POA: Diagnosis not present

## 2019-02-10 DIAGNOSIS — K219 Gastro-esophageal reflux disease without esophagitis: Secondary | ICD-10-CM | POA: Diagnosis not present

## 2019-02-10 DIAGNOSIS — Z8673 Personal history of transient ischemic attack (TIA), and cerebral infarction without residual deficits: Secondary | ICD-10-CM | POA: Diagnosis not present

## 2019-02-10 DIAGNOSIS — Z87440 Personal history of urinary (tract) infections: Secondary | ICD-10-CM | POA: Diagnosis not present

## 2019-02-10 DIAGNOSIS — N183 Chronic kidney disease, stage 3 (moderate): Secondary | ICD-10-CM | POA: Diagnosis not present

## 2019-02-10 DIAGNOSIS — I7774 Dissection of vertebral artery: Secondary | ICD-10-CM | POA: Diagnosis not present

## 2019-02-10 DIAGNOSIS — D689 Coagulation defect, unspecified: Secondary | ICD-10-CM | POA: Diagnosis not present

## 2019-02-10 DIAGNOSIS — Z466 Encounter for fitting and adjustment of urinary device: Secondary | ICD-10-CM | POA: Diagnosis not present

## 2019-02-10 DIAGNOSIS — B9620 Unspecified Escherichia coli [E. coli] as the cause of diseases classified elsewhere: Secondary | ICD-10-CM | POA: Diagnosis not present

## 2019-02-10 DIAGNOSIS — G300 Alzheimer's disease with early onset: Secondary | ICD-10-CM | POA: Diagnosis not present

## 2019-02-10 DIAGNOSIS — I129 Hypertensive chronic kidney disease with stage 1 through stage 4 chronic kidney disease, or unspecified chronic kidney disease: Secondary | ICD-10-CM | POA: Diagnosis not present

## 2019-02-10 DIAGNOSIS — Z79891 Long term (current) use of opiate analgesic: Secondary | ICD-10-CM | POA: Diagnosis not present

## 2019-02-10 DIAGNOSIS — I739 Peripheral vascular disease, unspecified: Secondary | ICD-10-CM | POA: Diagnosis not present

## 2019-02-11 DIAGNOSIS — N135 Crossing vessel and stricture of ureter without hydronephrosis: Secondary | ICD-10-CM | POA: Diagnosis not present

## 2019-02-11 DIAGNOSIS — R339 Retention of urine, unspecified: Secondary | ICD-10-CM | POA: Diagnosis not present

## 2019-02-13 ENCOUNTER — Other Ambulatory Visit: Payer: Self-pay | Admitting: Pharmacist

## 2019-02-13 ENCOUNTER — Ambulatory Visit: Payer: Self-pay | Admitting: Pharmacist

## 2019-02-13 NOTE — Patient Outreach (Signed)
[place]) Care Management  02/13/2019  [name] December 31, 1950 [id number]   Called Dr. Nyra Capes' office and left a message about the possibility to switching Anoro to [facility].  A note was routed earlier in the week but wanted to follow up with a phone call since I did not hear back.  If the provider deems the substitution appropriate, the patient will be sent medication assistance forms.   A phone message was left on the nurse voicemail.  Follow up in 3-5 business  [name], PharmD, [place] Clinical Pharmacist [phone]

## 2019-02-15 DIAGNOSIS — Z8673 Personal history of transient ischemic attack (TIA), and cerebral infarction without residual deficits: Secondary | ICD-10-CM | POA: Diagnosis not present

## 2019-02-15 DIAGNOSIS — G40909 Epilepsy, unspecified, not intractable, without status epilepticus: Secondary | ICD-10-CM | POA: Diagnosis not present

## 2019-02-15 DIAGNOSIS — N179 Acute kidney failure, unspecified: Secondary | ICD-10-CM

## 2019-02-15 DIAGNOSIS — E785 Hyperlipidemia, unspecified: Secondary | ICD-10-CM

## 2019-02-15 DIAGNOSIS — R4182 Altered mental status, unspecified: Secondary | ICD-10-CM

## 2019-02-15 DIAGNOSIS — G459 Transient cerebral ischemic attack, unspecified: Secondary | ICD-10-CM | POA: Diagnosis not present

## 2019-02-15 DIAGNOSIS — E872 Acidosis: Secondary | ICD-10-CM | POA: Diagnosis not present

## 2019-02-15 DIAGNOSIS — B9689 Other specified bacterial agents as the cause of diseases classified elsewhere: Secondary | ICD-10-CM | POA: Diagnosis not present

## 2019-02-15 DIAGNOSIS — N390 Urinary tract infection, site not specified: Secondary | ICD-10-CM

## 2019-02-15 DIAGNOSIS — I10 Essential (primary) hypertension: Secondary | ICD-10-CM

## 2019-02-15 DIAGNOSIS — J449 Chronic obstructive pulmonary disease, unspecified: Secondary | ICD-10-CM

## 2019-02-15 DIAGNOSIS — G309 Alzheimer's disease, unspecified: Secondary | ICD-10-CM

## 2019-02-16 DIAGNOSIS — B9689 Other specified bacterial agents as the cause of diseases classified elsewhere: Secondary | ICD-10-CM | POA: Diagnosis not present

## 2019-02-16 DIAGNOSIS — N390 Urinary tract infection, site not specified: Secondary | ICD-10-CM | POA: Diagnosis not present

## 2019-02-16 DIAGNOSIS — Z79899 Other long term (current) drug therapy: Secondary | ICD-10-CM | POA: Diagnosis not present

## 2019-02-16 DIAGNOSIS — E7800 Pure hypercholesterolemia, unspecified: Secondary | ICD-10-CM | POA: Diagnosis not present

## 2019-02-16 DIAGNOSIS — Z8673 Personal history of transient ischemic attack (TIA), and cerebral infarction without residual deficits: Secondary | ICD-10-CM | POA: Diagnosis not present

## 2019-02-16 DIAGNOSIS — R4701 Aphasia: Secondary | ICD-10-CM | POA: Diagnosis not present

## 2019-02-16 DIAGNOSIS — Z888 Allergy status to other drugs, medicaments and biological substances status: Secondary | ICD-10-CM | POA: Diagnosis not present

## 2019-02-16 DIAGNOSIS — E872 Acidosis: Secondary | ICD-10-CM | POA: Diagnosis not present

## 2019-02-16 DIAGNOSIS — R4182 Altered mental status, unspecified: Secondary | ICD-10-CM | POA: Diagnosis not present

## 2019-02-16 DIAGNOSIS — G40909 Epilepsy, unspecified, not intractable, without status epilepticus: Secondary | ICD-10-CM | POA: Diagnosis not present

## 2019-02-16 DIAGNOSIS — M1990 Unspecified osteoarthritis, unspecified site: Secondary | ICD-10-CM | POA: Diagnosis not present

## 2019-02-16 DIAGNOSIS — I129 Hypertensive chronic kidney disease with stage 1 through stage 4 chronic kidney disease, or unspecified chronic kidney disease: Secondary | ICD-10-CM | POA: Diagnosis not present

## 2019-02-16 DIAGNOSIS — N179 Acute kidney failure, unspecified: Secondary | ICD-10-CM | POA: Diagnosis not present

## 2019-02-16 DIAGNOSIS — Z885 Allergy status to narcotic agent status: Secondary | ICD-10-CM | POA: Diagnosis not present

## 2019-02-16 DIAGNOSIS — G309 Alzheimer's disease, unspecified: Secondary | ICD-10-CM | POA: Diagnosis not present

## 2019-02-16 DIAGNOSIS — I6523 Occlusion and stenosis of bilateral carotid arteries: Secondary | ICD-10-CM | POA: Diagnosis not present

## 2019-02-16 DIAGNOSIS — Z881 Allergy status to other antibiotic agents status: Secondary | ICD-10-CM | POA: Diagnosis not present

## 2019-02-16 DIAGNOSIS — E785 Hyperlipidemia, unspecified: Secondary | ICD-10-CM | POA: Diagnosis not present

## 2019-02-16 DIAGNOSIS — N183 Chronic kidney disease, stage 3 (moderate): Secondary | ICD-10-CM | POA: Diagnosis not present

## 2019-02-16 DIAGNOSIS — J449 Chronic obstructive pulmonary disease, unspecified: Secondary | ICD-10-CM | POA: Diagnosis not present

## 2019-02-17 ENCOUNTER — Other Ambulatory Visit: Payer: Self-pay | Admitting: Pharmacist

## 2019-02-17 ENCOUNTER — Ambulatory Visit: Payer: Self-pay | Admitting: Pharmacist

## 2019-02-17 NOTE — Patient Outreach (Signed)
[place] [facility]) Care Management  02/17/2019  [name] Jan 10, 1951 [id number]   Patient was called to follow up on medication assistance. Unfortunately, she did not answer her phone.  Called Dr. Nyra Capes office again to request a therapeutic switch from Anoro to [employer].  Plan: Follow up in 7-10 days due to PAL.  [name], PharmD, [place] Clinical Pharmacist [phone]

## 2019-02-18 DIAGNOSIS — Z9181 History of falling: Secondary | ICD-10-CM | POA: Diagnosis not present

## 2019-02-18 DIAGNOSIS — Z466 Encounter for fitting and adjustment of urinary device: Secondary | ICD-10-CM | POA: Diagnosis not present

## 2019-02-18 DIAGNOSIS — I739 Peripheral vascular disease, unspecified: Secondary | ICD-10-CM | POA: Diagnosis not present

## 2019-02-18 DIAGNOSIS — Z8673 Personal history of transient ischemic attack (TIA), and cerebral infarction without residual deficits: Secondary | ICD-10-CM | POA: Diagnosis not present

## 2019-02-18 DIAGNOSIS — I129 Hypertensive chronic kidney disease with stage 1 through stage 4 chronic kidney disease, or unspecified chronic kidney disease: Secondary | ICD-10-CM | POA: Diagnosis not present

## 2019-02-18 DIAGNOSIS — Z79891 Long term (current) use of opiate analgesic: Secondary | ICD-10-CM | POA: Diagnosis not present

## 2019-02-18 DIAGNOSIS — N319 Neuromuscular dysfunction of bladder, unspecified: Secondary | ICD-10-CM | POA: Diagnosis not present

## 2019-02-18 DIAGNOSIS — G300 Alzheimer's disease with early onset: Secondary | ICD-10-CM | POA: Diagnosis not present

## 2019-02-18 DIAGNOSIS — B9620 Unspecified Escherichia coli [E. coli] as the cause of diseases classified elsewhere: Secondary | ICD-10-CM | POA: Diagnosis not present

## 2019-02-18 DIAGNOSIS — J439 Emphysema, unspecified: Secondary | ICD-10-CM | POA: Diagnosis not present

## 2019-02-18 DIAGNOSIS — I7774 Dissection of vertebral artery: Secondary | ICD-10-CM | POA: Diagnosis not present

## 2019-02-18 DIAGNOSIS — N390 Urinary tract infection, site not specified: Secondary | ICD-10-CM | POA: Diagnosis not present

## 2019-02-18 DIAGNOSIS — N183 Chronic kidney disease, stage 3 (moderate): Secondary | ICD-10-CM | POA: Diagnosis not present

## 2019-02-18 DIAGNOSIS — D631 Anemia in chronic kidney disease: Secondary | ICD-10-CM | POA: Diagnosis not present

## 2019-02-18 DIAGNOSIS — Z87440 Personal history of urinary (tract) infections: Secondary | ICD-10-CM | POA: Diagnosis not present

## 2019-02-18 DIAGNOSIS — E43 Unspecified severe protein-calorie malnutrition: Secondary | ICD-10-CM | POA: Diagnosis not present

## 2019-02-18 DIAGNOSIS — M810 Age-related osteoporosis without current pathological fracture: Secondary | ICD-10-CM | POA: Diagnosis not present

## 2019-02-18 DIAGNOSIS — D689 Coagulation defect, unspecified: Secondary | ICD-10-CM | POA: Diagnosis not present

## 2019-02-18 DIAGNOSIS — D696 Thrombocytopenia, unspecified: Secondary | ICD-10-CM | POA: Diagnosis not present

## 2019-02-18 DIAGNOSIS — K219 Gastro-esophageal reflux disease without esophagitis: Secondary | ICD-10-CM | POA: Diagnosis not present

## 2019-02-20 ENCOUNTER — Other Ambulatory Visit: Payer: Self-pay

## 2019-02-20 DIAGNOSIS — J439 Emphysema, unspecified: Secondary | ICD-10-CM | POA: Diagnosis not present

## 2019-02-20 DIAGNOSIS — Z87440 Personal history of urinary (tract) infections: Secondary | ICD-10-CM | POA: Diagnosis not present

## 2019-02-20 DIAGNOSIS — J449 Chronic obstructive pulmonary disease, unspecified: Secondary | ICD-10-CM | POA: Diagnosis not present

## 2019-02-20 DIAGNOSIS — E43 Unspecified severe protein-calorie malnutrition: Secondary | ICD-10-CM | POA: Diagnosis not present

## 2019-02-20 DIAGNOSIS — G4700 Insomnia, unspecified: Secondary | ICD-10-CM | POA: Diagnosis not present

## 2019-02-20 DIAGNOSIS — I129 Hypertensive chronic kidney disease with stage 1 through stage 4 chronic kidney disease, or unspecified chronic kidney disease: Secondary | ICD-10-CM | POA: Diagnosis not present

## 2019-02-20 DIAGNOSIS — Z79899 Other long term (current) drug therapy: Secondary | ICD-10-CM | POA: Diagnosis not present

## 2019-02-20 DIAGNOSIS — D631 Anemia in chronic kidney disease: Secondary | ICD-10-CM | POA: Diagnosis not present

## 2019-02-20 DIAGNOSIS — G300 Alzheimer's disease with early onset: Secondary | ICD-10-CM | POA: Diagnosis not present

## 2019-02-20 DIAGNOSIS — B9620 Unspecified Escherichia coli [E. coli] as the cause of diseases classified elsewhere: Secondary | ICD-10-CM | POA: Diagnosis not present

## 2019-02-20 DIAGNOSIS — N319 Neuromuscular dysfunction of bladder, unspecified: Secondary | ICD-10-CM | POA: Diagnosis not present

## 2019-02-20 DIAGNOSIS — Z8673 Personal history of transient ischemic attack (TIA), and cerebral infarction without residual deficits: Secondary | ICD-10-CM | POA: Diagnosis not present

## 2019-02-20 DIAGNOSIS — I739 Peripheral vascular disease, unspecified: Secondary | ICD-10-CM | POA: Diagnosis not present

## 2019-02-20 DIAGNOSIS — Z9181 History of falling: Secondary | ICD-10-CM | POA: Diagnosis not present

## 2019-02-20 DIAGNOSIS — N183 Chronic kidney disease, stage 3 (moderate): Secondary | ICD-10-CM | POA: Diagnosis not present

## 2019-02-20 DIAGNOSIS — D696 Thrombocytopenia, unspecified: Secondary | ICD-10-CM | POA: Diagnosis not present

## 2019-02-20 DIAGNOSIS — N390 Urinary tract infection, site not specified: Secondary | ICD-10-CM | POA: Diagnosis not present

## 2019-02-20 DIAGNOSIS — M810 Age-related osteoporosis without current pathological fracture: Secondary | ICD-10-CM | POA: Diagnosis not present

## 2019-02-20 DIAGNOSIS — K219 Gastro-esophageal reflux disease without esophagitis: Secondary | ICD-10-CM | POA: Diagnosis not present

## 2019-02-20 DIAGNOSIS — Z79891 Long term (current) use of opiate analgesic: Secondary | ICD-10-CM | POA: Diagnosis not present

## 2019-02-20 DIAGNOSIS — I7774 Dissection of vertebral artery: Secondary | ICD-10-CM | POA: Diagnosis not present

## 2019-02-20 DIAGNOSIS — Z466 Encounter for fitting and adjustment of urinary device: Secondary | ICD-10-CM | POA: Diagnosis not present

## 2019-02-20 DIAGNOSIS — D689 Coagulation defect, unspecified: Secondary | ICD-10-CM | POA: Diagnosis not present

## 2019-02-20 DIAGNOSIS — I10 Essential (primary) hypertension: Secondary | ICD-10-CM | POA: Diagnosis not present

## 2019-02-20 NOTE — Patient Outreach (Signed)
New referral: Discharged from [facility]. MD office does transition of care:  Placed call to patient with no answer. No machine.   PLAN: will mail outreach letter and attempt again in 3 days.  Tomasa Rand, RN, BSN, CEN [facility] [employer] [phone]

## 2019-02-25 ENCOUNTER — Other Ambulatory Visit: Payer: Self-pay

## 2019-02-25 NOTE — Patient Outreach (Signed)
Screening: 2nd attempt to reach patient. Placed call to patient and female answered phone and states patient is asleep. Provided my name and contact number and requested a call back.  PLAN: will await a call back. If no response will call back in 3 business days.  Tomasa Rand, RN, BSN, CEN [facility] [employer] [phone]

## 2019-02-26 ENCOUNTER — Other Ambulatory Visit: Payer: Self-pay | Admitting: Pharmacist

## 2019-02-26 DIAGNOSIS — N319 Neuromuscular dysfunction of bladder, unspecified: Secondary | ICD-10-CM | POA: Diagnosis not present

## 2019-02-26 DIAGNOSIS — D631 Anemia in chronic kidney disease: Secondary | ICD-10-CM | POA: Diagnosis not present

## 2019-02-26 DIAGNOSIS — D696 Thrombocytopenia, unspecified: Secondary | ICD-10-CM | POA: Diagnosis not present

## 2019-02-26 DIAGNOSIS — N183 Chronic kidney disease, stage 3 (moderate): Secondary | ICD-10-CM | POA: Diagnosis not present

## 2019-02-26 DIAGNOSIS — I129 Hypertensive chronic kidney disease with stage 1 through stage 4 chronic kidney disease, or unspecified chronic kidney disease: Secondary | ICD-10-CM | POA: Diagnosis not present

## 2019-02-26 DIAGNOSIS — Z9181 History of falling: Secondary | ICD-10-CM | POA: Diagnosis not present

## 2019-02-26 DIAGNOSIS — N390 Urinary tract infection, site not specified: Secondary | ICD-10-CM | POA: Diagnosis not present

## 2019-02-26 DIAGNOSIS — B9620 Unspecified Escherichia coli [E. coli] as the cause of diseases classified elsewhere: Secondary | ICD-10-CM | POA: Diagnosis not present

## 2019-02-26 DIAGNOSIS — G300 Alzheimer's disease with early onset: Secondary | ICD-10-CM | POA: Diagnosis not present

## 2019-02-26 DIAGNOSIS — M810 Age-related osteoporosis without current pathological fracture: Secondary | ICD-10-CM | POA: Diagnosis not present

## 2019-02-26 DIAGNOSIS — I7774 Dissection of vertebral artery: Secondary | ICD-10-CM | POA: Diagnosis not present

## 2019-02-26 DIAGNOSIS — E43 Unspecified severe protein-calorie malnutrition: Secondary | ICD-10-CM | POA: Diagnosis not present

## 2019-02-26 DIAGNOSIS — D689 Coagulation defect, unspecified: Secondary | ICD-10-CM | POA: Diagnosis not present

## 2019-02-26 DIAGNOSIS — J439 Emphysema, unspecified: Secondary | ICD-10-CM | POA: Diagnosis not present

## 2019-02-26 DIAGNOSIS — Z8673 Personal history of transient ischemic attack (TIA), and cerebral infarction without residual deficits: Secondary | ICD-10-CM | POA: Diagnosis not present

## 2019-02-26 DIAGNOSIS — Z87440 Personal history of urinary (tract) infections: Secondary | ICD-10-CM | POA: Diagnosis not present

## 2019-02-26 DIAGNOSIS — Z79891 Long term (current) use of opiate analgesic: Secondary | ICD-10-CM | POA: Diagnosis not present

## 2019-02-26 DIAGNOSIS — K219 Gastro-esophageal reflux disease without esophagitis: Secondary | ICD-10-CM | POA: Diagnosis not present

## 2019-02-26 DIAGNOSIS — I739 Peripheral vascular disease, unspecified: Secondary | ICD-10-CM | POA: Diagnosis not present

## 2019-02-26 DIAGNOSIS — Z466 Encounter for fitting and adjustment of urinary device: Secondary | ICD-10-CM | POA: Diagnosis not present

## 2019-02-26 NOTE — Patient Outreach (Signed)
[place] [facility]) Care Management  02/26/2019  [name] 08-Aug-1951 [id number]   Patient was called to follow up on Anoro/Siolto switch. HIPAA identifiers were obtained. Patient stated she had not heard anything and still did not have an inhaler because it was too expensive. Dr. Nyra Capes' office was called again today and another message was left. In addition, a "Physician Barrier's Letter" was sent to Dr. Nyra Capes' office today as the patient is without her medication and multiple telephone calls have been placed.   Plan: Follow up in 5-7 business days.   [name], PharmD, [place] Clinical Pharmacist [phone]

## 2019-02-27 ENCOUNTER — Ambulatory Visit: Payer: Self-pay | Admitting: Pharmacist

## 2019-03-02 ENCOUNTER — Other Ambulatory Visit: Payer: Self-pay

## 2019-03-02 NOTE — Patient Outreach (Signed)
Screening:  3rd attempt to reach patient was successful.  Explained reason for call and [facility] services.  Patient reports she does not need anything except help with her medications. On reviewed of electronic medical record, noted patient is active with Denyse Amass.   PLAN: will close to nursing as patient declines needs.   Tomasa Rand, RN, BSN, CEN [facility] [employer] [phone]

## 2019-03-03 DIAGNOSIS — I7774 Dissection of vertebral artery: Secondary | ICD-10-CM | POA: Diagnosis not present

## 2019-03-03 DIAGNOSIS — Z87440 Personal history of urinary (tract) infections: Secondary | ICD-10-CM | POA: Diagnosis not present

## 2019-03-03 DIAGNOSIS — B9620 Unspecified Escherichia coli [E. coli] as the cause of diseases classified elsewhere: Secondary | ICD-10-CM | POA: Diagnosis not present

## 2019-03-03 DIAGNOSIS — N319 Neuromuscular dysfunction of bladder, unspecified: Secondary | ICD-10-CM | POA: Diagnosis not present

## 2019-03-03 DIAGNOSIS — I129 Hypertensive chronic kidney disease with stage 1 through stage 4 chronic kidney disease, or unspecified chronic kidney disease: Secondary | ICD-10-CM | POA: Diagnosis not present

## 2019-03-03 DIAGNOSIS — Z8673 Personal history of transient ischemic attack (TIA), and cerebral infarction without residual deficits: Secondary | ICD-10-CM | POA: Diagnosis not present

## 2019-03-03 DIAGNOSIS — Z9181 History of falling: Secondary | ICD-10-CM | POA: Diagnosis not present

## 2019-03-03 DIAGNOSIS — I739 Peripheral vascular disease, unspecified: Secondary | ICD-10-CM | POA: Diagnosis not present

## 2019-03-03 DIAGNOSIS — M810 Age-related osteoporosis without current pathological fracture: Secondary | ICD-10-CM | POA: Diagnosis not present

## 2019-03-03 DIAGNOSIS — N390 Urinary tract infection, site not specified: Secondary | ICD-10-CM | POA: Diagnosis not present

## 2019-03-03 DIAGNOSIS — Z79891 Long term (current) use of opiate analgesic: Secondary | ICD-10-CM | POA: Diagnosis not present

## 2019-03-03 DIAGNOSIS — G300 Alzheimer's disease with early onset: Secondary | ICD-10-CM | POA: Diagnosis not present

## 2019-03-03 DIAGNOSIS — Z466 Encounter for fitting and adjustment of urinary device: Secondary | ICD-10-CM | POA: Diagnosis not present

## 2019-03-03 DIAGNOSIS — E43 Unspecified severe protein-calorie malnutrition: Secondary | ICD-10-CM | POA: Diagnosis not present

## 2019-03-03 DIAGNOSIS — D631 Anemia in chronic kidney disease: Secondary | ICD-10-CM | POA: Diagnosis not present

## 2019-03-03 DIAGNOSIS — J439 Emphysema, unspecified: Secondary | ICD-10-CM | POA: Diagnosis not present

## 2019-03-03 DIAGNOSIS — D696 Thrombocytopenia, unspecified: Secondary | ICD-10-CM | POA: Diagnosis not present

## 2019-03-03 DIAGNOSIS — N183 Chronic kidney disease, stage 3 (moderate): Secondary | ICD-10-CM | POA: Diagnosis not present

## 2019-03-03 DIAGNOSIS — K219 Gastro-esophageal reflux disease without esophagitis: Secondary | ICD-10-CM | POA: Diagnosis not present

## 2019-03-03 DIAGNOSIS — D689 Coagulation defect, unspecified: Secondary | ICD-10-CM | POA: Diagnosis not present

## 2019-03-06 ENCOUNTER — Ambulatory Visit: Payer: Self-pay | Admitting: Pharmacist

## 2019-03-06 ENCOUNTER — Other Ambulatory Visit: Payer: Self-pay | Admitting: Pharmacist

## 2019-03-06 DIAGNOSIS — R339 Retention of urine, unspecified: Secondary | ICD-10-CM | POA: Diagnosis not present

## 2019-03-06 DIAGNOSIS — N3289 Other specified disorders of bladder: Secondary | ICD-10-CM | POA: Diagnosis not present

## 2019-03-06 NOTE — Patient Outreach (Signed)
[place] [facility]) Care Management  03/06/2019  GENIYAH EISCHEID [birth year] [id number]   Called patient to follow up on Anoro/Stiolto switch. Unfortunately, she did not answer her phone. HIPAA compliant message was left with a man who identified himself as her son.    [facility] to see if [facility] Nyra Capes office had called in the [facility] prescription. [facility] said they [facility] Carvedilol for the patient today but did not have Stiolto or Anoro on file.  [facility] was called.  They reported the patient did not have Anoro on file for the patient.  Plan: Await call back from patient. Call patient back in 5-7 business days and encourage her to call Dr. Nyra Capes' office. Dr. Nyra Capes' office has been call multiple times and a barriers letter was sent without response.  [name], PharmD, [place] Clinical Pharmacist [phone]

## 2019-03-09 DIAGNOSIS — Z79899 Other long term (current) drug therapy: Secondary | ICD-10-CM | POA: Diagnosis not present

## 2019-03-09 DIAGNOSIS — N183 Chronic kidney disease, stage 3 (moderate): Secondary | ICD-10-CM | POA: Diagnosis not present

## 2019-03-09 DIAGNOSIS — N390 Urinary tract infection, site not specified: Secondary | ICD-10-CM | POA: Diagnosis not present

## 2019-03-09 DIAGNOSIS — J449 Chronic obstructive pulmonary disease, unspecified: Secondary | ICD-10-CM | POA: Diagnosis not present

## 2019-03-09 DIAGNOSIS — B9689 Other specified bacterial agents as the cause of diseases classified elsewhere: Secondary | ICD-10-CM | POA: Diagnosis not present

## 2019-03-09 DIAGNOSIS — Z8673 Personal history of transient ischemic attack (TIA), and cerebral infarction without residual deficits: Secondary | ICD-10-CM | POA: Diagnosis not present

## 2019-03-09 DIAGNOSIS — T83511A Infection and inflammatory reaction due to indwelling urethral catheter, initial encounter: Secondary | ICD-10-CM | POA: Diagnosis not present

## 2019-03-09 DIAGNOSIS — I129 Hypertensive chronic kidney disease with stage 1 through stage 4 chronic kidney disease, or unspecified chronic kidney disease: Secondary | ICD-10-CM | POA: Diagnosis not present

## 2019-03-12 ENCOUNTER — Other Ambulatory Visit: Payer: Self-pay | Admitting: Pharmacist

## 2019-03-12 DIAGNOSIS — Z09 Encounter for follow-up examination after completed treatment for conditions other than malignant neoplasm: Secondary | ICD-10-CM | POA: Diagnosis not present

## 2019-03-12 DIAGNOSIS — N390 Urinary tract infection, site not specified: Secondary | ICD-10-CM | POA: Diagnosis not present

## 2019-03-12 NOTE — Patient Outreach (Addendum)
[place] [facility]) Care Management  03/12/2019  [name] 1951-09-28 [id number]   Patient was called. Unfortunately, she did not answer her phone and did not have voicemail.  I received an email from [place], Orlin Hilding that the patient's PCP had changed to [facility].  A message about the Stiolto vs Anoro due to insurance was left with Dr. Ethlyn Daniels front office staff.  Called patient's emergency contact, [name] (her son).  [name] said he had taken his mother back to the provider's office today because she was not doing well (needing a walker, sleeping a lot..etc).   Patient was started on Amipcillin, Cephalexin and Oxycodone on 03/10/2019.  Plan: Follow up with patient and her caregiver after their appointment.   [name], PharmD, BCACP [facility] Clinical Pharmacist [phone]  ADDENDUM  Patient's son [name]) called me back after patient's appointment. HIPAA identiifers were obtained. [name] said he spoke with Dr. Alma Friendly and her nurse should be addressing the inhaler issue (Stiolto to [employer]).  In addition, [name] said Dr. Alma Friendly recommended that they get in touch with Dr. Arlana [place]au office about her UTI therapy.  Plan: Follow up with [name] and the patient in 1 week.   [name], PharmD, [place] Clinical Pharmacist [phone]

## 2019-03-13 DIAGNOSIS — Z466 Encounter for fitting and adjustment of urinary device: Secondary | ICD-10-CM | POA: Diagnosis not present

## 2019-03-13 DIAGNOSIS — I130 Hypertensive heart and chronic kidney disease with heart failure and stage 1 through stage 4 chronic kidney disease, or unspecified chronic kidney disease: Secondary | ICD-10-CM | POA: Diagnosis not present

## 2019-03-13 DIAGNOSIS — I739 Peripheral vascular disease, unspecified: Secondary | ICD-10-CM | POA: Diagnosis not present

## 2019-03-13 DIAGNOSIS — G300 Alzheimer's disease with early onset: Secondary | ICD-10-CM | POA: Diagnosis not present

## 2019-03-13 DIAGNOSIS — D696 Thrombocytopenia, unspecified: Secondary | ICD-10-CM | POA: Diagnosis not present

## 2019-03-13 DIAGNOSIS — D689 Coagulation defect, unspecified: Secondary | ICD-10-CM | POA: Diagnosis not present

## 2019-03-13 DIAGNOSIS — N390 Urinary tract infection, site not specified: Secondary | ICD-10-CM | POA: Diagnosis not present

## 2019-03-13 DIAGNOSIS — N179 Acute kidney failure, unspecified: Secondary | ICD-10-CM | POA: Diagnosis not present

## 2019-03-13 DIAGNOSIS — K219 Gastro-esophageal reflux disease without esophagitis: Secondary | ICD-10-CM | POA: Diagnosis not present

## 2019-03-13 DIAGNOSIS — I7774 Dissection of vertebral artery: Secondary | ICD-10-CM | POA: Diagnosis not present

## 2019-03-13 DIAGNOSIS — I5033 Acute on chronic diastolic (congestive) heart failure: Secondary | ICD-10-CM | POA: Diagnosis not present

## 2019-03-13 DIAGNOSIS — N183 Chronic kidney disease, stage 3 (moderate): Secondary | ICD-10-CM | POA: Diagnosis not present

## 2019-03-13 DIAGNOSIS — Z8673 Personal history of transient ischemic attack (TIA), and cerebral infarction without residual deficits: Secondary | ICD-10-CM | POA: Diagnosis not present

## 2019-03-13 DIAGNOSIS — D631 Anemia in chronic kidney disease: Secondary | ICD-10-CM | POA: Diagnosis not present

## 2019-03-13 DIAGNOSIS — J439 Emphysema, unspecified: Secondary | ICD-10-CM | POA: Diagnosis not present

## 2019-03-13 DIAGNOSIS — E43 Unspecified severe protein-calorie malnutrition: Secondary | ICD-10-CM | POA: Diagnosis not present

## 2019-03-13 DIAGNOSIS — T39391D Poisoning by other nonsteroidal anti-inflammatory drugs [NSAID], accidental (unintentional), subsequent encounter: Secondary | ICD-10-CM | POA: Diagnosis not present

## 2019-03-13 DIAGNOSIS — J9601 Acute respiratory failure with hypoxia: Secondary | ICD-10-CM | POA: Diagnosis not present

## 2019-03-13 DIAGNOSIS — M810 Age-related osteoporosis without current pathological fracture: Secondary | ICD-10-CM | POA: Diagnosis not present

## 2019-03-13 DIAGNOSIS — N319 Neuromuscular dysfunction of bladder, unspecified: Secondary | ICD-10-CM | POA: Diagnosis not present

## 2019-03-18 ENCOUNTER — Other Ambulatory Visit: Payer: Self-pay | Admitting: Pharmacist

## 2019-03-18 NOTE — Patient Outreach (Signed)
[place] [facility]) Care Management  03/18/2019  DANAE OLAND [birth year] [id number]   Patient and her son Larene Beach) were called to follow up.  Unfortunately, patient's phone number is not operational.  A HIPAA compliant message was left on Shannon's voicemail.  Plan: Call patient back in 7-10 business days.   [name], PharmD, [place] Clinical Pharmacist [phone]

## 2019-03-20 ENCOUNTER — Ambulatory Visit: Payer: Self-pay | Admitting: Pharmacist

## 2019-03-23 DIAGNOSIS — R1084 Generalized abdominal pain: Secondary | ICD-10-CM | POA: Diagnosis not present

## 2019-03-23 DIAGNOSIS — R0689 Other abnormalities of breathing: Secondary | ICD-10-CM | POA: Diagnosis not present

## 2019-03-23 DIAGNOSIS — R1031 Right lower quadrant pain: Secondary | ICD-10-CM | POA: Diagnosis not present

## 2019-03-23 DIAGNOSIS — R339 Retention of urine, unspecified: Secondary | ICD-10-CM | POA: Diagnosis not present

## 2019-03-23 DIAGNOSIS — N390 Urinary tract infection, site not specified: Secondary | ICD-10-CM | POA: Diagnosis not present

## 2019-03-23 DIAGNOSIS — R52 Pain, unspecified: Secondary | ICD-10-CM | POA: Diagnosis not present

## 2019-03-23 DIAGNOSIS — R069 Unspecified abnormalities of breathing: Secondary | ICD-10-CM | POA: Diagnosis not present

## 2019-03-24 ENCOUNTER — Other Ambulatory Visit: Payer: Self-pay | Admitting: Infectious Diseases

## 2019-03-24 ENCOUNTER — Inpatient Hospital Stay (HOSPITAL_COMMUNITY)
Admission: AD | Admit: 2019-03-24 | Discharge: 2019-03-30 | DRG: 917 | Disposition: A | Discharge status: Home Health | Payer: Medicare Other | Source: Other Acute Inpatient Hospital | Attending: Internal Medicine | Admitting: Internal Medicine

## 2019-03-24 ENCOUNTER — Inpatient Hospital Stay (HOSPITAL_COMMUNITY): Payer: Medicare Other

## 2019-03-24 ENCOUNTER — Encounter (HOSPITAL_COMMUNITY): Payer: Self-pay

## 2019-03-24 ENCOUNTER — Inpatient Hospital Stay: Payer: Self-pay

## 2019-03-24 DIAGNOSIS — R197 Diarrhea, unspecified: Secondary | ICD-10-CM | POA: Diagnosis not present

## 2019-03-24 DIAGNOSIS — T83511A Infection and inflammatory reaction due to indwelling urethral catheter, initial encounter: Secondary | ICD-10-CM | POA: Diagnosis not present

## 2019-03-24 DIAGNOSIS — N179 Acute kidney failure, unspecified: Secondary | ICD-10-CM | POA: Diagnosis not present

## 2019-03-24 DIAGNOSIS — Z66 Do not resuscitate: Secondary | ICD-10-CM | POA: Diagnosis present

## 2019-03-24 DIAGNOSIS — D649 Anemia, unspecified: Secondary | ICD-10-CM | POA: Diagnosis present

## 2019-03-24 DIAGNOSIS — Z20828 Contact with and (suspected) exposure to other viral communicable diseases: Secondary | ICD-10-CM | POA: Diagnosis present

## 2019-03-24 DIAGNOSIS — Z8614 Personal history of Methicillin resistant Staphylococcus aureus infection: Secondary | ICD-10-CM

## 2019-03-24 DIAGNOSIS — Z7189 Other specified counseling: Secondary | ICD-10-CM | POA: Diagnosis not present

## 2019-03-24 DIAGNOSIS — K912 Postsurgical malabsorption, not elsewhere classified: Secondary | ICD-10-CM | POA: Diagnosis not present

## 2019-03-24 DIAGNOSIS — J189 Pneumonia, unspecified organism: Secondary | ICD-10-CM | POA: Diagnosis not present

## 2019-03-24 DIAGNOSIS — F17210 Nicotine dependence, cigarettes, uncomplicated: Secondary | ICD-10-CM | POA: Diagnosis present

## 2019-03-24 DIAGNOSIS — E872 Acidosis, unspecified: Secondary | ICD-10-CM

## 2019-03-24 DIAGNOSIS — N183 Chronic kidney disease, stage 3 (moderate): Secondary | ICD-10-CM | POA: Diagnosis not present

## 2019-03-24 DIAGNOSIS — R0600 Dyspnea, unspecified: Secondary | ICD-10-CM | POA: Diagnosis not present

## 2019-03-24 DIAGNOSIS — R410 Disorientation, unspecified: Secondary | ICD-10-CM

## 2019-03-24 DIAGNOSIS — G309 Alzheimer's disease, unspecified: Secondary | ICD-10-CM | POA: Diagnosis not present

## 2019-03-24 DIAGNOSIS — G301 Alzheimer's disease with late onset: Secondary | ICD-10-CM | POA: Diagnosis not present

## 2019-03-24 DIAGNOSIS — Z888 Allergy status to other drugs, medicaments and biological substances status: Secondary | ICD-10-CM

## 2019-03-24 DIAGNOSIS — F0280 Dementia in other diseases classified elsewhere without behavioral disturbance: Secondary | ICD-10-CM | POA: Diagnosis not present

## 2019-03-24 DIAGNOSIS — R109 Unspecified abdominal pain: Secondary | ICD-10-CM

## 2019-03-24 DIAGNOSIS — R0602 Shortness of breath: Secondary | ICD-10-CM

## 2019-03-24 DIAGNOSIS — I5033 Acute on chronic diastolic (congestive) heart failure: Secondary | ICD-10-CM | POA: Diagnosis present

## 2019-03-24 DIAGNOSIS — Z515 Encounter for palliative care: Secondary | ICD-10-CM | POA: Diagnosis not present

## 2019-03-24 DIAGNOSIS — N390 Urinary tract infection, site not specified: Secondary | ICD-10-CM | POA: Diagnosis present

## 2019-03-24 DIAGNOSIS — E785 Hyperlipidemia, unspecified: Secondary | ICD-10-CM | POA: Diagnosis present

## 2019-03-24 DIAGNOSIS — E876 Hypokalemia: Secondary | ICD-10-CM | POA: Diagnosis present

## 2019-03-24 DIAGNOSIS — I739 Peripheral vascular disease, unspecified: Secondary | ICD-10-CM | POA: Diagnosis present

## 2019-03-24 DIAGNOSIS — N170 Acute kidney failure with tubular necrosis: Secondary | ICD-10-CM | POA: Diagnosis not present

## 2019-03-24 DIAGNOSIS — R64 Cachexia: Secondary | ICD-10-CM | POA: Diagnosis not present

## 2019-03-24 DIAGNOSIS — Z681 Body mass index (BMI) 19 or less, adult: Secondary | ICD-10-CM

## 2019-03-24 DIAGNOSIS — G4700 Insomnia, unspecified: Secondary | ICD-10-CM | POA: Diagnosis present

## 2019-03-24 DIAGNOSIS — K902 Blind loop syndrome, not elsewhere classified: Secondary | ICD-10-CM | POA: Diagnosis present

## 2019-03-24 DIAGNOSIS — R1013 Epigastric pain: Secondary | ICD-10-CM | POA: Diagnosis not present

## 2019-03-24 DIAGNOSIS — Z79899 Other long term (current) drug therapy: Secondary | ICD-10-CM

## 2019-03-24 DIAGNOSIS — J9601 Acute respiratory failure with hypoxia: Secondary | ICD-10-CM | POA: Diagnosis present

## 2019-03-24 DIAGNOSIS — Z90710 Acquired absence of both cervix and uterus: Secondary | ICD-10-CM

## 2019-03-24 DIAGNOSIS — R0603 Acute respiratory distress: Secondary | ICD-10-CM | POA: Diagnosis not present

## 2019-03-24 DIAGNOSIS — J811 Chronic pulmonary edema: Secondary | ICD-10-CM

## 2019-03-24 DIAGNOSIS — F05 Delirium due to known physiological condition: Secondary | ICD-10-CM | POA: Diagnosis present

## 2019-03-24 DIAGNOSIS — J9621 Acute and chronic respiratory failure with hypoxia: Secondary | ICD-10-CM | POA: Diagnosis present

## 2019-03-24 DIAGNOSIS — I509 Heart failure, unspecified: Secondary | ICD-10-CM | POA: Diagnosis not present

## 2019-03-24 DIAGNOSIS — G40301 Generalized idiopathic epilepsy and epileptic syndromes, not intractable, with status epilepticus: Secondary | ICD-10-CM

## 2019-03-24 DIAGNOSIS — T39391A Poisoning by other nonsteroidal anti-inflammatory drugs [NSAID], accidental (unintentional), initial encounter: Secondary | ICD-10-CM | POA: Diagnosis not present

## 2019-03-24 DIAGNOSIS — J440 Chronic obstructive pulmonary disease with acute lower respiratory infection: Secondary | ICD-10-CM | POA: Diagnosis not present

## 2019-03-24 DIAGNOSIS — G9341 Metabolic encephalopathy: Secondary | ICD-10-CM | POA: Diagnosis not present

## 2019-03-24 DIAGNOSIS — Z91040 Latex allergy status: Secondary | ICD-10-CM

## 2019-03-24 DIAGNOSIS — Z8673 Personal history of transient ischemic attack (TIA), and cerebral infarction without residual deficits: Secondary | ICD-10-CM

## 2019-03-24 DIAGNOSIS — J431 Panlobular emphysema: Secondary | ICD-10-CM | POA: Diagnosis not present

## 2019-03-24 DIAGNOSIS — J810 Acute pulmonary edema: Secondary | ICD-10-CM | POA: Diagnosis not present

## 2019-03-24 DIAGNOSIS — J441 Chronic obstructive pulmonary disease with (acute) exacerbation: Secondary | ICD-10-CM | POA: Diagnosis not present

## 2019-03-24 DIAGNOSIS — Z9049 Acquired absence of other specified parts of digestive tract: Secondary | ICD-10-CM

## 2019-03-24 DIAGNOSIS — I129 Hypertensive chronic kidney disease with stage 1 through stage 4 chronic kidney disease, or unspecified chronic kidney disease: Secondary | ICD-10-CM | POA: Diagnosis not present

## 2019-03-24 DIAGNOSIS — J129 Viral pneumonia, unspecified: Secondary | ICD-10-CM | POA: Diagnosis not present

## 2019-03-24 DIAGNOSIS — D638 Anemia in other chronic diseases classified elsewhere: Secondary | ICD-10-CM | POA: Diagnosis not present

## 2019-03-24 DIAGNOSIS — Z885 Allergy status to narcotic agent status: Secondary | ICD-10-CM

## 2019-03-24 DIAGNOSIS — I130 Hypertensive heart and chronic kidney disease with heart failure and stage 1 through stage 4 chronic kidney disease, or unspecified chronic kidney disease: Secondary | ICD-10-CM | POA: Diagnosis not present

## 2019-03-24 DIAGNOSIS — I959 Hypotension, unspecified: Secondary | ICD-10-CM | POA: Diagnosis present

## 2019-03-24 DIAGNOSIS — J449 Chronic obstructive pulmonary disease, unspecified: Secondary | ICD-10-CM | POA: Diagnosis present

## 2019-03-24 DIAGNOSIS — G40909 Epilepsy, unspecified, not intractable, without status epilepticus: Secondary | ICD-10-CM | POA: Diagnosis present

## 2019-03-24 DIAGNOSIS — D696 Thrombocytopenia, unspecified: Secondary | ICD-10-CM | POA: Diagnosis present

## 2019-03-24 DIAGNOSIS — F0281 Dementia in other diseases classified elsewhere with behavioral disturbance: Secondary | ICD-10-CM | POA: Diagnosis not present

## 2019-03-24 DIAGNOSIS — T39094A Poisoning by salicylates, undetermined, initial encounter: Secondary | ICD-10-CM | POA: Diagnosis not present

## 2019-03-24 DIAGNOSIS — I10 Essential (primary) hypertension: Secondary | ICD-10-CM | POA: Diagnosis present

## 2019-03-24 DIAGNOSIS — Z89422 Acquired absence of other left toe(s): Secondary | ICD-10-CM

## 2019-03-24 DIAGNOSIS — T39011A Poisoning by aspirin, accidental (unintentional), initial encounter: Principal | ICD-10-CM | POA: Diagnosis present

## 2019-03-24 HISTORY — DX: Chronic kidney disease, stage 3 (moderate): N18.3

## 2019-03-24 HISTORY — DX: Chronic obstructive pulmonary disease with (acute) exacerbation: J44.1

## 2019-03-24 HISTORY — DX: Cerebral infarction, unspecified: I63.9

## 2019-03-24 HISTORY — DX: Disorientation, unspecified: R41.0

## 2019-03-24 HISTORY — DX: Acute kidney failure, unspecified: N17.9

## 2019-03-24 HISTORY — DX: Anemia, unspecified: D64.9

## 2019-03-24 LAB — PROCALCITONIN: Procalcitonin: 1.17 ng/mL

## 2019-03-24 LAB — BLOOD GAS, ARTERIAL
Acid-base deficit: 6.7 mmol/L — ABNORMAL HIGH (ref 0.0–2.0)
Acid-base deficit: 5.1 mmol/L — ABNORMAL HIGH (ref 0.0–2.0)
Bicarbonate: 16.9 mmol/L — ABNORMAL LOW (ref 20.0–28.0)
Bicarbonate: 18.6 mmol/L — ABNORMAL LOW (ref 20.0–28.0)
Drawn by: 54879
Drawn by: 36496
FIO2: 100
O2 Content: 6 L/min
O2 Saturation: 83.6 %
O2 Saturation: 98.6 %
Patient temperature: 97.6
Patient temperature: 97.3
pCO2 arterial: 25.6 mmHg — ABNORMAL LOW (ref 32.0–48.0)
pCO2 arterial: 28.2 mmHg — ABNORMAL LOW (ref 32.0–48.0)
pH, Arterial: 7.432 (ref 7.350–7.450)
pH, Arterial: 7.431 (ref 7.350–7.450)
pO2, Arterial: 43.8 mmHg — ABNORMAL LOW (ref 83.0–108.0)
pO2, Arterial: 120 mmHg — ABNORMAL HIGH (ref 83.0–108.0)

## 2019-03-24 LAB — URINALYSIS, ROUTINE W REFLEX MICROSCOPIC
Bilirubin Urine: NEGATIVE
Glucose, UA: 150 mg/dL — AB
Ketones, ur: NEGATIVE mg/dL
Nitrite: NEGATIVE
Protein, ur: 30 mg/dL — AB
RBC / HPF: >50 RBC/hpf — ABNORMAL HIGH (ref 0–5)
Specific Gravity, Urine: 1.019 (ref 1.005–1.030)
WBC, UA: >50 WBC/hpf — ABNORMAL HIGH (ref 0–5)
pH: 5 (ref 5.0–8.0)

## 2019-03-24 LAB — COMPREHENSIVE METABOLIC PANEL
ALT: 6 U/L (ref 0–44)
AST: 32 U/L (ref 15–41)
Albumin: 1.9 g/dL — ABNORMAL LOW (ref 3.5–5.0)
Alkaline Phosphatase: 69 U/L (ref 38–126)
Anion gap: 14 (ref 5–15)
BUN: 28 mg/dL — ABNORMAL HIGH (ref 8–23)
CO2: 16 mmol/L — ABNORMAL LOW (ref 22–32)
Calcium: 6 mg/dL — CL (ref 8.9–10.3)
Chloride: 113 mmol/L — ABNORMAL HIGH (ref 98–111)
Creatinine, Ser: 1.98 mg/dL — ABNORMAL HIGH (ref 0.44–1.00)
GFR calc Af Amer: 30 mL/min — ABNORMAL LOW (ref >60)
GFR calc non Af Amer: 26 mL/min — ABNORMAL LOW (ref >60)
Glucose, Bld: 86 mg/dL (ref 70–99)
Potassium: 2.6 mmol/L — CL (ref 3.5–5.1)
Sodium: 143 mmol/L (ref 135–145)
Total Bilirubin: 0.5 mg/dL (ref 0.3–1.2)
Total Protein: 4.2 g/dL — ABNORMAL LOW (ref 6.5–8.1)

## 2019-03-24 LAB — SODIUM, URINE, RANDOM: Sodium, Ur: 24 mmol/L

## 2019-03-24 LAB — TROPONIN I: Troponin I: <0.03 ng/mL (ref <0.03)

## 2019-03-24 LAB — ACETAMINOPHEN LEVEL: Acetaminophen (Tylenol), Serum: <10 ug/mL — ABNORMAL LOW (ref 10–30)

## 2019-03-24 LAB — C-REACTIVE PROTEIN: CRP: 13 mg/dL — ABNORMAL HIGH (ref <1.0)

## 2019-03-24 LAB — SARS CORONAVIRUS 2 BY RT PCR (HOSPITAL ORDER, PERFORMED IN ~~LOC~~ HOSPITAL LAB): SARS Coronavirus 2: NEGATIVE

## 2019-03-24 LAB — PHENYTOIN LEVEL, TOTAL: Phenytoin Lvl: 5.1 ug/mL — ABNORMAL LOW (ref 10.0–20.0)

## 2019-03-24 LAB — CREATININE, URINE, RANDOM: Creatinine, Urine: 46.58 mg/dL

## 2019-03-24 LAB — SALICYLATE LEVEL: Salicylate Lvl: 19.6 mg/dL (ref 2.8–30.0)

## 2019-03-24 LAB — LACTATE DEHYDROGENASE: LDH: 438 U/L — ABNORMAL HIGH (ref 98–192)

## 2019-03-24 LAB — FERRITIN: Ferritin: 151 ng/mL (ref 11–307)

## 2019-03-24 LAB — D-DIMER, QUANTITATIVE: D-Dimer, Quant: 2.79 ug/mL-FEU — ABNORMAL HIGH (ref 0.00–0.50)

## 2019-03-24 MED ORDER — VANCOMYCIN VARIABLE DOSE PER UNSTABLE RENAL FUNCTION (PHARMACIST DOSING): Status: DC

## 2019-03-24 MED ORDER — POTASSIUM CHLORIDE 10 MEQ/100ML IV SOLN
10.0000 meq | INTRAVENOUS | Status: AC
  Administered 2019-03-24 (×3): 10 meq via INTRAVENOUS
  Filled 2019-03-24 (×3): qty 100

## 2019-03-24 MED ORDER — METHYLPREDNISOLONE SODIUM SUCC 40 MG IJ SOLR
40.0000 mg | Freq: Three times a day (TID) | INTRAMUSCULAR | Status: DC
  Administered 2019-03-24 – 2019-03-27 (×8): 40 mg via INTRAVENOUS
  Filled 2019-03-24 (×9): qty 1

## 2019-03-24 MED ORDER — LORAZEPAM 2 MG/ML IJ SOLN
0.5000 mg | Freq: Once | INTRAMUSCULAR | Status: AC
  Administered 2019-03-24: 0.5 mg via INTRAVENOUS
  Filled 2019-03-24: qty 1

## 2019-03-24 MED ORDER — STERILE WATER FOR INJECTION IV SOLN
INTRAVENOUS | Status: DC
  Administered 2019-03-24 – 2019-03-25 (×2): via INTRAVENOUS
  Filled 2019-03-24 (×6): qty 850

## 2019-03-24 MED ORDER — ACETAMINOPHEN 325 MG PO TABS: 650.0000 mg | ORAL_TABLET | Freq: Four times a day (QID) | ORAL | Status: DC | PRN

## 2019-03-24 MED ORDER — DOCUSATE SODIUM 100 MG PO CAPS
100.0000 mg | ORAL_CAPSULE | Freq: Two times a day (BID) | ORAL | Status: DC
  Administered 2019-03-27 – 2019-03-30 (×6): 100 mg via ORAL
  Filled 2019-03-24 (×7): qty 1

## 2019-03-24 MED ORDER — ALBUTEROL SULFATE HFA 108 (90 BASE) MCG/ACT IN AERS
2.0000 | INHALATION_SPRAY | Freq: Four times a day (QID) | RESPIRATORY_TRACT | Status: DC
  Administered 2019-03-24 – 2019-03-25 (×2): 2 via RESPIRATORY_TRACT
  Filled 2019-03-24: qty 6.7

## 2019-03-24 MED ORDER — HYDROXYZINE HCL 25 MG PO TABS: 25.0000 mg | ORAL_TABLET | Freq: Once | ORAL | Status: DC

## 2019-03-24 MED ORDER — MORPHINE SULFATE (PF) 2 MG/ML IV SOLN
2.0000 mg | INTRAVENOUS | Status: DC | PRN
  Administered 2019-03-24: 2 mg via INTRAVENOUS
  Filled 2019-03-24: qty 1

## 2019-03-24 MED ORDER — LACTATED RINGERS IV SOLN
INTRAVENOUS | Status: DC
  Administered 2019-03-24: 13:00:00 via INTRAVENOUS

## 2019-03-24 MED ORDER — PHENYTOIN SODIUM 50 MG/ML IJ SOLN
65.0000 mg | Freq: Three times a day (TID) | INTRAMUSCULAR | Status: DC
  Administered 2019-03-24 – 2019-03-30 (×19): 65 mg via INTRAVENOUS
  Filled 2019-03-24 (×4): qty 2
  Filled 2019-03-24 (×2): qty 1.3
  Filled 2019-03-24: qty 2
  Filled 2019-03-24 (×3): qty 1.3
  Filled 2019-03-24: qty 2
  Filled 2019-03-24 (×4): qty 1.3
  Filled 2019-03-24: qty 2
  Filled 2019-03-24: qty 1.3
  Filled 2019-03-24: qty 2
  Filled 2019-03-24 (×3): qty 1.3
  Filled 2019-03-24: qty 2

## 2019-03-24 MED ORDER — BISACODYL 5 MG PO TBEC: 5.0000 mg | DELAYED_RELEASE_TABLET | Freq: Every day | ORAL | Status: DC | PRN

## 2019-03-24 MED ORDER — PIPERACILLIN-TAZOBACTAM 3.375 G IVPB 30 MIN
3.3750 g | Freq: Once | INTRAVENOUS | Status: AC
  Administered 2019-03-24: 3.375 g via INTRAVENOUS
  Filled 2019-03-24: qty 50

## 2019-03-24 MED ORDER — VANCOMYCIN HCL 1000 MG IV SOLR
1000.0000 mg | Freq: Once | INTRAVENOUS | Status: AC
  Administered 2019-03-24: 1000 mg via INTRAVENOUS
  Filled 2019-03-24: qty 1000

## 2019-03-24 MED ORDER — PIPERACILLIN-TAZOBACTAM 3.375 G IVPB
3.3750 g | Freq: Three times a day (TID) | INTRAVENOUS | Status: DC
  Administered 2019-03-24 – 2019-03-28 (×11): 3.375 g via INTRAVENOUS
  Filled 2019-03-24 (×15): qty 50

## 2019-03-24 MED ORDER — ONDANSETRON HCL 4 MG PO TABS: 4.0000 mg | ORAL_TABLET | Freq: Four times a day (QID) | ORAL | Status: DC | PRN

## 2019-03-24 MED ORDER — LEVETIRACETAM IN NACL 500 MG/100ML IV SOLN
500.0000 mg | Freq: Two times a day (BID) | INTRAVENOUS | Status: DC
  Administered 2019-03-24 – 2019-03-30 (×13): 500 mg via INTRAVENOUS
  Filled 2019-03-24 (×14): qty 100

## 2019-03-24 MED ORDER — SODIUM CHLORIDE 0.9% FLUSH: 10.0000 mL | INTRAVENOUS | Status: DC | PRN

## 2019-03-24 MED ORDER — ONDANSETRON HCL 4 MG/2ML IJ SOLN: 4.0000 mg | Freq: Four times a day (QID) | INTRAMUSCULAR | Status: DC | PRN

## 2019-03-24 MED ORDER — UMECLIDINIUM-VILANTEROL 62.5-25 MCG/INH IN AEPB
1.0000 | INHALATION_SPRAY | Freq: Every day | RESPIRATORY_TRACT | Status: DC
  Administered 2019-03-25: 1 via RESPIRATORY_TRACT
  Filled 2019-03-24: qty 14

## 2019-03-24 MED ORDER — OXYCODONE HCL 5 MG PO TABS
5.0000 mg | ORAL_TABLET | ORAL | Status: DC | PRN
  Administered 2019-03-25 – 2019-03-28 (×5): 5 mg via ORAL
  Filled 2019-03-24 (×5): qty 1

## 2019-03-24 MED ORDER — CALCIUM GLUCONATE-NACL 1-0.675 GM/50ML-% IV SOLN
1.0000 g | Freq: Once | INTRAVENOUS | Status: AC
  Administered 2019-03-24: 1000 mg via INTRAVENOUS
  Filled 2019-03-24: qty 50

## 2019-03-24 MED ORDER — ENOXAPARIN SODIUM 40 MG/0.4ML ~~LOC~~ SOLN
40.0000 mg | Freq: Every morning | SUBCUTANEOUS | Status: DC
  Administered 2019-03-25: 40 mg via SUBCUTANEOUS
  Filled 2019-03-24 (×2): qty 0.4

## 2019-03-24 MED ORDER — POLYETHYLENE GLYCOL 3350 17 G PO PACK: 17.0000 g | PACK | Freq: Every day | ORAL | Status: DC | PRN

## 2019-03-24 NOTE — Progress Notes (Signed)
RT called to room to assess patient. When I arrived, patient SAT was reading 86% on 3 LNC. Having a hard time picking up SAT though at times due to cold fingers. Placed on [place] HFNC. At [facility] sat reading 90%. Patient seems to be slightly confused and all over the bed. RN at bedside

## 2019-03-24 NOTE — Progress Notes (Signed)
RT NOTE: RT arrived to patients room to obtain ABG. Patient seemed confused yet was okay with RT obtaining ABG. Lab arrived with RT in room. RT attempted to stick x2 and patient pulled arm away, yelled at RT and Lab, became angry and somewhat combative towards RT and lab. RT and Lab notified RN. RN aware. RT will continue to monitor as needed.

## 2019-03-24 NOTE — Progress Notes (Signed)
Peripherally Inserted Central Catheter/Midline Placement  The IV Nurse has discussed with the patient and/or persons authorized to consent for the patient, the purpose of this procedure and the potential benefits and risks involved with this procedure.  The benefits include less needle sticks, lab draws from the catheter, and the patient may be discharged home with the catheter. Risks include, but not limited to, infection, bleeding, blood clot (thrombus formation), and puncture of an artery; nerve damage and irregular heartbeat and possibility to perform a PICC exchange if needed/ordered by physician.  Alternatives to this procedure were also discussed.  [employer] PICC patient education guide, fact sheet on infection prevention and patient information card has been provided to patient /or left at bedside.    PICC/Midline Placement Documentation  PICC Triple Lumen 03/24/19 PICC Right Brachial 36 cm 0 cm (Active)  Indication for Insertion or Continuance of Line Poor Vasculature-patient has had multiple peripheral attempts or PIVs lasting less than 24 hours 03/24/2019  5:31 PM  Exposed Catheter (cm) 0 cm 03/24/2019  5:31 PM  Site Assessment Clean;Dry;Intact 03/24/2019  5:31 PM  Lumen #1 Status Flushed;Blood return noted;Saline locked 03/24/2019  5:31 PM  Lumen #2 Status Flushed;No blood return;Saline locked 03/24/2019  5:31 PM  Lumen #3 Status Flushed;Blood return noted;Saline locked 03/24/2019  5:31 PM  Dressing Type Transparent 03/24/2019  5:31 PM  Dressing Status Clean;Dry;Intact;Antimicrobial disc in place 03/24/2019  5:31 PM  Dressing Change Due 03/31/19 03/24/2019  5:31 PM       [name] 03/24/2019, 5:58 PM

## 2019-03-24 NOTE — H&P (Signed)
History and Physical    [name]:[id number] DOB: Feb 16, 1951 DOA: 03/24/2019  PCP: Marco Collie, MD Consultants:  [facility] - neurology; [facility] - GI Patient coming from:  Home; NOK: Son, [phone]  Chief Complaint:  SOB  HPI: [name] is a 68 y.o. 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  She is unable to provide history.  I called and spoke with her son, [name].  Her son is her caregiver.  Sunday, she didn't feel well - he thought she had a UTI.  He called urology (Dr. Gerlene Burdock) yesterday and he diagnosed a bad UTI.  Last night, "she just didn't look right."  She was weak and SOB.  He gave her breathing treatments and neb treatments.  She didn't sleep.  She does not have any medication for pain - previously using oxycontin 5 mg.  She has been taking Bayer back and body aches.  She took 2 tabs at a time 2 times during the day, provided by a caregiver.  She does have it in her room and could have taken more.  She also takes Motrin.  No one else is sick in the home.  Her son and nephew both have asthma and neither of them has been ill.  She has not tested for COVID.  No fever but she was clammy last night.  She has COPD and has chronic cough but this was better yesterday.  No GI symptoms.  She is conversant at baseline but has dementia - she recognizes her son but does not know the year, president.  She is oriented to person and place at baseline.  She was intermittently confused yesterday.  She would want to be full code.   ED Course: Transfer from [facility] ER, discussed with Dr. Sabra Heck:  He reports that she was altered, tachypneic, acidotic with pH 7.0. Salicylate level  high. Given HCO3 with drip, level improved to 33. pH up to 7.15. She is still having tachypnea, wheezing with underlying COPD. She got a neb but CXR showed ground glass, ?COVID. Has been getting MDIs, sats 95%, persistent tachypnea (?related to ASA overdose). HR 90, BP stable 132/60. Repeat BMP with K+ 2.6, repleting. Diffuse wheezing on exam. She does not currently need intubation. If she becomes more hypoxic and not responding to MDIs, may need more aggressive bronchodilator therapy. Appears to still be appropriate for SDU.   Review of Systems: unable to perform    Past Medical History:  Diagnosis Date  . Alzheimer's dementia ([place])    takes Aricept nightly  . Anemia   . COPD (chronic obstructive pulmonary disease) ([place])   . History of blood transfusion    received 2units on Feb 17,2015  . History of MRSA infection    several yrs ago  . HLD (hyperlipidemia) 06/06/2018  . HTN (hypertension) 06/06/2018  . Hyperlipidemia    takes Atorvastatin daily  . Hypertension    takes Amlodipine and Carvedilol daily  . Insomnia    takes Ambien nightly  . Memory loss   . Renal disorder    kidney stones  . Seizures ([place])   . Stroke ([place])   . Subacute delirium 10/14/2013   takes Seroquel and Dilantin  nightly  . Toxic encephalopathy   . UTI (lower urinary tract infection)    takes Macrodantin daily    Past Surgical History:  Procedure Laterality Date  . ABDOMINAL ANGIOGRAM N/A 01/20/2014   Procedure: ABDOMINAL ANGIOGRAM;  Surgeon: Elam Dutch, MD;  Location: [facility] CATH LAB;  Service: Cardiovascular;  Laterality: N/A;  . ABDOMINAL HYSTERECTOMY    . AMPUTATION Left 02/08/2014   Procedure: AMPUTATION Left 4th & 5th toes;  Surgeon: Conrad Monterey, MD;  Location: [place];  Service: Vascular;  Laterality: Left;  . BLADDER SURGERY    . CHOLECYSTECTOMY  1995   Gall Bladder  . ERCP N/A 10/24/2018   Procedure: ENDOSCOPIC RETROGRADE CHOLANGIOPANCREATOGRAPHY (ERCP);  Surgeon: Clarene Essex, MD;  Location: [place];  Service: Endoscopy;  Laterality: N/A;  . INSERTION OF ILIAC STENT Left 01/26/2014   Procedure: INSERTION OF ILIAC STENT- LEFT COMMON ILIAC ARTERY STENT ;  Surgeon: Conrad Sigel, MD;  Location: [place];  Service: Vascular;  Laterality: Left;  . KNEE SURGERY    . PANCREATIC STENT PLACEMENT  10/24/2018   Procedure: PANCREATIC STENT PLACEMENT;  Surgeon: Clarene Essex, MD;  Location: [place];  Service: Endoscopy;;  . REMOVAL OF STONES  10/24/2018   Procedure: REMOVAL OF STONES;  Surgeon: Clarene Essex, MD;  Location: [place];  Service: Endoscopy;;  balloon sweep  . SPHINCTEROTOMY  10/24/2018   Procedure: SPHINCTEROTOMY;  Surgeon: Clarene Essex, MD;  Location: [facility] ENDOSCOPY;  Service: Endoscopy;;    Social History   Socioeconomic History  . Marital status: Married    Spouse name: Not on file  . Number of children: Not on file  . Years of education: 90  . Highest education level: Not on file  Occupational History  . Not on file  Social Needs  . Financial resource strain: Not on file  . Food insecurity:    Worry: Not on file    Inability: Not on file  . Transportation needs:    Medical: Not on file    Non-medical: Not on file  Tobacco Use  . Smoking status: Current Every Day Smoker    Packs/day: 1.00    Types: Cigarettes  . Smokeless tobacco: Never Used  Substance and Sexual Activity  . Alcohol use: No  . Drug use: No  . Sexual activity: Not on file  Lifestyle  . Physical activity:    Days per week: Not on file    Minutes per session: Not on file  . Stress: Not on file  Relationships  . Social connections:    Talks on phone: Not on file    Gets together: Not on file    Attends religious service: Not on file    Active member of club or organization: Not on file    Attends meetings of clubs or organizations: Not on file    Relationship status: Not on file  . Intimate partner violence:    Fear of current or ex partner: Not on file    Emotionally abused: Not on  file    Physically abused: Not on file    Forced sexual activity: Not on file  Other Topics Concern  . Not on file  Social History Narrative   10/13/18 Living with son, [name] at home.  Caregiver during the day.    Allergies  Allergen Reactions  . Codeine Nausea And Vomiting    REACTION: intolerance  . Ace Inhibitors   . Latex Other (See Comments)    itching  Family History  Problem Relation Age of Onset  . Cancer Brother     Prior to Admission medications   Medication Sig Start Date End Date Taking? Authorizing Provider  amLODipine (NORVASC) 10 MG tablet Take 1 tablet (10 mg total) by mouth every evening. 10/31/18   Kayleen Memos, DO  atorvastatin (LIPITOR) 20 MG tablet Take 20 mg by mouth daily. 11/07/18   [provider]  carvedilol (COREG) 6.25 MG tablet Take 6.25 mg by mouth 2 (two) times daily. 01/12/19   [provider]  cyclobenzaprine (FLEXERIL) 5 MG tablet Take 5 mg by mouth every 8 (eight) hours. 01/26/19   [provider]  diclofenac (VOLTAREN) 50 MG EC tablet Take 50 mg by mouth 3 (three) times daily. 01/26/19   [provider]  diclofenac sodium (VOLTAREN) 1 % GEL Apply 2 g topically 3 (three) times daily. 10/31/18   Kayleen Memos, DO  donepezil (ARICEPT) 10 MG tablet TAKE 1 BY MOUTH AT BEDTIME 12/08/18   Ward Givens, NP  doxazosin (CARDURA) 2 MG tablet Take 2 mg by mouth daily. 10/21/18   [provider]  ferrous sulfate 325 (65 FE) MG tablet Take 1 tablet (325 mg total) by mouth 2 (two) times daily with a meal. 10/31/18   Hall, Lorenda Cahill, DO  levETIRAcetam (KEPPRA) 500 MG tablet Take 1 tablet by mouth 2 (two) times daily. 01/20/19   [provider]  mirtazapine (REMERON) 45 MG tablet Take 1 tablet by mouth at bedtime. 02/20/19   [provider]  ondansetron (ZOFRAN-ODT) 4 MG disintegrating tablet DISSOLVE 1 TABLET UNDER THE TONGUE EVERY 6 HOURS AS NEEDED FOR NAUSEA/VOMITING 03/10/19   [provider]  oxyCODONE (OXY IR/ROXICODONE) 5 MG immediate release tablet Take 5 mg by mouth every 6 (six) hours as needed. for pain 03/10/19   [provider]  pantoprazole (PROTONIX) 40 MG tablet Take 1 tablet by mouth daily. 01/01/19   [provider]  phenytoin (DILANTIN) 100 MG ER capsule Take 2 capsules (200 mg total) by mouth at bedtime. 10/13/18   Ward Givens, NP  sodium bicarbonate 650 MG tablet Take 1 tablet (650 mg total) by mouth 3 (three) times daily. Patient taking differently: Take 650 mg by mouth 2 (two) times daily.  10/31/18   Kayleen Memos, DO  umeclidinium-vilanterol (ANORO ELLIPTA) 62.5-25 MCG/INH AEPB Inhale 1 puff into the lungs daily as needed (shortness of breath or wheezing).    [provider]    Physical Exam: Vitals:   03/24/19 1041 03/24/19 1300 03/24/19 1500 03/24/19 1600  BP:   98/82   Pulse:   77   Resp:   16   Temp:      TempSrc:      SpO2: 90% 91% 96% 94%     . General: Alert but unable to form coherent responses to questions; agitated and fidgeting in bed; cachectic . Eyes:   EOMI, normal lids, iris . ENT:  grossly normal hearing, lips & tongue, mmm . Neck:  no LAD, masses or thyromegaly . Cardiovascular:  RRR, no m/r/g. No LE edema.  Marland Kitchen Respiratory:   CTA bilaterally with no wheezes/rales/rhonchi.  Normal to mildly increased respiratory effort. . Abdomen:  soft, NT, ND, NABS . Skin:  no rash or induration seen on limited exam . Musculoskeletal:  grossly normal tone BUE/BLE, good ROM, no bony abnormality . Psychiatric: agitated, fidgety, attempts to respond to questions but with nonsensical speech . Neurologic: unable to effectively perform  Radiological Exams on Admission: [place] Ekg Site Rite  Result Date: 03/24/2019 If Site Rite image not attached, placement could not be confirmed due to current cardiac rhythm.   Labs on Admission: I have personally reviewed the available labs and imaging studies at the time  of the admission.  Pertinent labs from [place]:   WBC 16 Hgb 11 Platelets 138 HCO3 <5 BUN 26/Creatinine 2.0 Glucose 148 Lactate <0.5 PH 7.04/08/75 -> 7.16 LDH 938 CRP 38 Salicylate 38 CT abdomen with R > L BLL ground glass opacities  ABG here: 7.432/25.6/43.8/16.9/83.6% - this was confirmed by RT to be an arterial sample, while on 6L [place] O2) COVID negative by in-house test UA: 150 glucose, moderate Hgb, 30 protein, large LE, rare bacteria, >50 RBC, > 50 WBC Additional labs pending PICC line placement   Assessment/Plan Principal Problem:   Acute on chronic respiratory failure with hypoxia (HCC) Active Problems:   COPD (chronic obstructive pulmonary disease) (HCC)   Blind loop syndrome   Subacute delirium   Acute renal failure (HCC)   Seizure disorder (HCC)   HTN (hypertension)    Acute on chronic respiratory failure with hypoxia -She was generally unable to provide history but her son reports myalgias, SOB, chronic cough -While this could be related to salicylate poisoning (see below), her acidosis is improving but her oxygenation is significantly worsening -Additionally, her labs and CXR were concerning for novel coronavirus infection with COVID-19 -She does not have a usual home O2 requirement and is currently requiring 6L [place] O2 with pO2 43.8 by recent ABG -She was in-house COVID negative, but this test will be sent out to [facility] for further verification -The patient has comorbidities which may increase the risk for ARDS/MODS including: age, HTN, COPD -Exam is concerning for development of ARDS/MODS due to escalating hypoxemia,renal failure -Pertinent labs concerning for COVID include increased BUN/Creatinine; markedly elevated CRP; increased LDH -CXR with RLL infiltrate but CT chest showed R>L BLL ground glass opacities; this could be consistent with aspiration PNA but Covid is a clear concern -Will treat with broad-spectrum antibiotics (Vanc, Zosyn) pending  procalcitonin -Will admit for further evaluation, close monitoring, and treatment -Initial plan was to monitor in Progressive care on telemetry x at least 24 hours, but PCCM has been consulted for possible intubation given marked hypoxemic respiratory failure -At this time, will attempt to avoid use of aerosolized medications and use HFAs instead -Today's labs are still pending due to access issues and include: APAP level; ABG q4h; CRP; CBC; CMP; D-dimer; ferritin; LDH; phenytoin level; procalcitonin; and troponin -Consider EKG for QTc monitoring (especially when on hydroxychloroquine); Echo (particularly if troponin is significantly elevated)  -Consider Solumedrol 40-80 mg IV q8h x 3 days and possibly transfer to ICU based on PCCM consultation -Given high-risk for ARDS/MODS, will use high-flow  up to 15L in a negative pressure room for now with PCCM consultation -Possible therapeutic interventions include hydroxychloroquine; IL-6 inhibitor (Tocilizumab, 8 mg/kg IV x 1 dose); and possibly Azithromycin, vitamin C, zinc -Will attempt to maintain euvolemia to a net negative fluid status -If D-dimer <5, will use standard-dosed Lovenox for DVT prevention; if D-dimer >5, will use weight-based Lovenox prophylaxis at this time (0.5 mg/kg q12h) -Patient was seen wearing full PPE including: gown, gloves, head cover, N95, and face shield; donning and doffing was observed to be in compliance with current standards. -Patient log was signed and witnessed  Salicylate overdose -Patient with reported use of Bayer back and body aches at home, and the  bottle was left in her room -At [facility], Poison Control was called and did not recommend HD -She was started on a HCO3 drip with improvement in pH -Since arrival here, she has apparently not received the HCO3 drip due to access issues (PICC line was ordered and pending but given possible need for ICU admission she may instead require CVL placement); despite this, her pH  has normalized and her HCO3 on ABG is up to almost 17 -Despite this improvement in her acidosis, her oxygenation is still markedly impaired -Nephrology was consulted to confirm decision not to dialyze the patient at this time -Will follow salicylate level to ensure ongoing improvement  COPD -Chronic cough -Not apparently on home O2 -She is on Albuterol nebs and this appears to be monotherapy  Delirium with underlying dementia -Delirium currently suspected to be related to severe hypoxemia, although underlying dementia and salicylate poisoning may be contributing factors -Resume Aricept when able to successfully swallow, although this is unlikely to improve clinical outcomes while hospitalized -resume remeron when able  Acute renal failure -Baseline creatinine is thought to be about 1.1 -Creatinine is currently 2.0 -This may be associated with salicylate poisoning, infection/SIRS, or other -Gently hydrate and follow BMP -Nephrology has been consulted to assist with electrolyte and renal issues; assistance is appreciated  Neobladder with indwelling foley -Prior h/o suprapubic catheter but has foley now -Foley was changed out at [place] -Urine is abnormal but appears to be appropriate for her circumstances -She is currently taking Keflex and it was provided by her PCP yesterday according to my conversation with her son -Hold Cardura for now  Short gut syndrome -Has chronic acidosis, for which she routinely takes HCO3  Seizure d/o -Seizures were thought to be possibly related to prior fall which led to subdural hematoma -Check Dilantin level -Pharmacy consult to transition Dilantin to IV form for now  HTN -Hold PO medications including Coreg, Norvasc, HCTZ, Cardura -Can give IV hydralazine if needed  DVT prophylaxis:  Lovenox  Code Status:  Full - confirmed with family Family Communication: None present, but I spoke with her son by telephone Disposition Plan:  To be  determined Consults called: Nephrology; PCCM  Admission status: Admit - It is my clinical opinion that admission to INPATIENT is reasonable and necessary because of the expectation that this patient will require hospital care that crosses at least 2 midnights to treat this condition based on the medical complexity of the problems presented.  Given the aforementioned information, the predictability of an adverse outcome is felt to be significant.     Karmen Bongo MD [facility]   How to contact the [facility] Attending or Consulting provider [place] or covering provider during after hours [place], for this patient?  1. Check the care team in [facility] and look for a) attending/consulting TRH provider listed and b) the [facility] team listed 2. Log into [email or web address] and use [place]'s universal password to access. If you do not have the password, please contact the hospital operator. 3. Locate the [facility] provider you are looking for under [facility] and page to a number that you can be directly reached. 4. If you still have difficulty reaching the provider, please page the [facility] (Director on Call) for the Hospitalists listed on amion for assistance.   03/24/2019, 4:40 PM

## 2019-03-24 NOTE — Progress Notes (Signed)
Spoke with Dr. Posey Pronto.  He is OK with PICC placement.  Will placed after consent is obtain.

## 2019-03-24 NOTE — Progress Notes (Addendum)
Pharmacy Antibiotic Note  [name] is a 68 y.o. female admitted on 03/24/2019 with AKI and AMS.  She is noted with Pharmacy has been consulted for vancomycin and zosyn dosing for possible PNA.  -SCr= 1.98 (1.07 on 10/2018), CrCl ~ 30 -last weight was 45.1kg in 10/2018   Plan: -Zosyn 3.375gm IV Q8h -Vancomycin 1000mg  IV x1 then will follow SCr trend -Will follow renal function, cultures and clinical progress      Temp (24hrs), Avg:97.6 F (36.4 C), Min:97.6 F (36.4 C), Max:97.6 F (36.4 C)  No results for input(s): WBC, CREATININE, LATICACIDVEN, VANCOTROUGH, VANCOPEAK, VANCORANDOM, GENTTROUGH, GENTPEAK, GENTRANDOM, TOBRATROUGH, TOBRAPEAK, TOBRARND, AMIKACINPEAK, AMIKACINTROU, AMIKACIN in the last 168 hours.  CrCl cannot be calculated (Patient's most recent lab result is older than the maximum 21 days allowed.).    Allergies  Allergen Reactions  . Codeine Nausea And Vomiting    REACTION: intolerance  . Ace Inhibitors   . Latex Other (See Comments)    itching    Antimicrobials this admission: 4/14 vanc>> 4/14 zosyn>>  Dose adjustments this admission:   Microbiology results: 4/14 blood x1 4/14 covid - neg  Thank you for allowing pharmacy to be a part of this patient's care.  Hildred Laser, PharmD Clinical Pharmacist **Pharmacist phone directory can now be found on [email or web address] ([email or web address]).  Listed under [place].

## 2019-03-24 NOTE — Consult Note (Signed)
Reason for Consult: Acute kidney injury, metabolic acidosis Referring Physician: Karmen Bongo, MD [facility])  HPI:  68 year old Caucasian woman with past medical history significant for hypertension, dyslipidemia, COPD, Alzheimer's type dementia with history of delirium in the past and chronic kidney disease stage III at baseline (creatinine around 1.7).  She was transferred from [facility] ER for COVID-19 rule out and management of acute kidney injury, profound metabolic acidosis and elevated salicylate level of 36.  Initial urinalysis at [facility] suggestive of possible UTI with positive leukocyte esterase and 3+ blood.  I attempted to obtain further history from the patient but was unsuccessful as she appears to be somewhat confused but not visibly dyspneic.  She complains of back pain and keeps perseverating about her chair.  Past Medical History:  Diagnosis Date  . Alzheimer's dementia ([place])    takes Aricept nightly  . Anemia   . COPD (chronic obstructive pulmonary disease) ([place])   . History of blood transfusion    received 2units on Feb 17,2015  . History of MRSA infection    several yrs ago  . HLD (hyperlipidemia) 06/06/2018  . HTN (hypertension) 06/06/2018  . Hyperlipidemia    takes Atorvastatin daily  . Hypertension    takes Amlodipine and Carvedilol daily  . Insomnia    takes Ambien nightly  . Memory loss   . Renal disorder    kidney stones  . Seizures ([place])   . Stroke ([place])   . Subacute delirium 10/14/2013   takes Seroquel and Dilantin nightly  . Toxic encephalopathy   . UTI (lower urinary tract infection)    takes Macrodantin daily    Past Surgical History:  Procedure Laterality Date  . ABDOMINAL ANGIOGRAM N/A 01/20/2014   Procedure: ABDOMINAL ANGIOGRAM;  Surgeon: Elam Dutch, MD;  Location: [facility] CATH LAB;  Service: Cardiovascular;  Laterality: N/A;  . ABDOMINAL HYSTERECTOMY    . AMPUTATION Left 02/08/2014   Procedure: AMPUTATION Left 4th & 5th toes;  Surgeon: Conrad Azle, MD;  Location: [place];  Service: Vascular;  Laterality: Left;  . BLADDER SURGERY    . CHOLECYSTECTOMY  1995   Gall Bladder  . ERCP N/A 10/24/2018   Procedure: ENDOSCOPIC RETROGRADE CHOLANGIOPANCREATOGRAPHY (ERCP);  Surgeon: Clarene Essex, MD;  Location: [place];  Service: Endoscopy;  Laterality: N/A;  . INSERTION OF ILIAC STENT Left 01/26/2014   Procedure: INSERTION OF ILIAC STENT- LEFT COMMON ILIAC ARTERY STENT ;  Surgeon: Conrad [place], MD;  Location: [place];  Service: Vascular;  Laterality: Left;  . KNEE SURGERY    . PANCREATIC STENT PLACEMENT  10/24/2018   Procedure: PANCREATIC STENT PLACEMENT;  Surgeon: Clarene Essex, MD;  Location: [place];  Service: Endoscopy;;  . REMOVAL OF STONES  10/24/2018   Procedure: REMOVAL OF STONES;  Surgeon: Clarene Essex, MD;  Location: [place];  Service: Endoscopy;;  balloon sweep  . SPHINCTEROTOMY  10/24/2018   Procedure: SPHINCTEROTOMY;  Surgeon: Clarene Essex, MD;  Location: [facility] ENDOSCOPY;  Service: Endoscopy;;    Family History  Problem Relation Age of Onset  . Cancer Brother     Social History:  reports that she has been smoking cigarettes. She has been smoking about 1.00 pack per day. She has never used smokeless tobacco. She reports that she does not drink alcohol or use drugs.  Allergies:  Allergies  Allergen Reactions  . Codeine Nausea And Vomiting    REACTION: intolerance  . Ace Inhibitors   . Latex Other (See Comments)    itching  Medications:  Scheduled: . docusate sodium  100 mg Oral BID  . enoxaparin (LOVENOX) injection  40 mg Subcutaneous q morning - 10a  . phenytoin (DILANTIN) IV  65 mg Intravenous Q8H  . umeclidinium-vilanterol  1 puff Inhalation Daily    BMP Latest Ref Rng & Units 10/31/2018 10/30/2018 10/28/2018  Glucose 70 - 99 mg/dL 108(H) 111(H) 56(L)  BUN 8 - 23 mg/dL <5(L) <5(L) 16  Creatinine 0.44 - 1.00 mg/dL 1.07(H) 1.06(H) 1.21(H)  BUN/Creat Ratio 12 - 28 - - -  Sodium 135 - 145 mmol/L 141 143 139   Potassium 3.5 - 5.1 mmol/L 3.8 2.9(L) 3.8  Chloride 98 - 111 mmol/L 118(H) 119(H) 116(H)  CO2 22 - 32 mmol/L 19(L) 19(L) 14(L)  Calcium 8.9 - 10.3 mg/dL 7.5(L) 7.6(L) 7.7(L)   CBC Latest Ref Rng & Units 10/31/2018 10/30/2018 10/28/2018  WBC 4.0 - 10.5 K/uL 4.6 4.3 6.6  Hemoglobin 12.0 - 15.0 g/dL 8.1(L) 8.8(L) 7.9(L)  Hematocrit 36.0 - 46.0 % 27.7(L) 29.0(L) 27.4(L)  Platelets 150 - 400 K/uL 92(L) 106(L) 93(L)     Review of Systems  Unable to perform ROS: Mental acuity   Blood pressure 93/67, pulse 68, temperature 97.6 F (36.4 C), temperature source Axillary, resp. rate 19, SpO2 90 %. Physical Exam  Nursing note and vitals reviewed. Constitutional: She appears well-developed. No distress.  Petite frame  HENT:  Head: Normocephalic and atraumatic.  Mouth/Throat: Oropharynx is clear and moist.  Eyes: Pupils are equal, round, and reactive to light. EOM are normal. No scleral icterus.  Neck: Normal range of motion. Neck supple. No JVD present.  Cardiovascular: Normal rate, regular rhythm and normal heart sounds.  No murmur heard. Respiratory: Effort normal and breath sounds normal. She has no wheezes. She has no rales.  GI: Soft. Bowel sounds are normal. There is no abdominal tenderness. There is no rebound.  Musculoskeletal:        General: No edema.  Skin: Skin is warm and dry. There is pallor.    Assessment/Plan: 1.  Acute kidney injury on chronic kidney disease stage III: Suspect hemodynamically mediated versus associated with SIRS.  Will check urine electrolytes and give isotonic bicarbonate/balanced crystalloid intravenous fluids for volume expansion and management of metabolic acidosis.  Per [place] poison control and up to date recommendations, no indications for hemodialysis at this time for her salicylate level of 36. 2.  Anion gap metabolic acidosis: Likely secondary to acute kidney injury versus salicylate versus lactate.  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 3.  Altered mental status: With delirium superimposed on her chronic dementia.  Ongoing evaluation for possible infectious etiology that could be contributing to her encephalopathy.  Possibly compounded by metabolic acidosis.  Recently with Comycin resistant enterococcus and E. coli urinary tract infection diagnosed in the emergency room-likely treated. 4.  COVID-19 rule out: Indicated by hypoxia and tachypnea with metabolic acidosis. 5.  Hypokalemia: Further increasing suspicion for excessive GI losses/decreased oral intake.  Replace via intravenous/oral route.  Meron Bocchino K. 03/24/2019, 12:38 PM

## 2019-03-24 NOTE — Progress Notes (Signed)
RT NOTE:  ABG results reported to Vanetta Shawl, RN.

## 2019-03-24 NOTE — Consult Note (Signed)
NAME:  [name], MRN:  [id number], DOB:  11/18/1951, LOS: 0 ADMISSION DATE:  03/24/2019, CONSULTATION DATE:  03/24/2019 REFERRING MD:  Dr Lorin Mercy CHIEF COMPLAINT:  SOB    Brief History   Patient presented to the hospital with abdominal pain and shortness of breath History of obstructive lung disease, hyperlipidemia, history of blind loop syndrome, peripheral vascular disease, She does have some dementia at baseline Transferred from [facility] for salicylate poisoning  History of present illness   History of chronic cough, background history of COPD She has intermittent confusion Difficult to obtain any history from patient Recent diagnosis of urinary tract infection Has been using Motrin for body aches  Past Medical History   Past Medical History:  Diagnosis Date  . Alzheimer's dementia ([place])    takes Aricept nightly  . Anemia   . COPD (chronic obstructive pulmonary disease) ([place])   . History of blood transfusion    received 2units on Feb 17,2015  . History of MRSA infection    several yrs ago  . HLD (hyperlipidemia) 06/06/2018  . HTN (hypertension) 06/06/2018  . Hyperlipidemia    takes Atorvastatin daily  . Hypertension    takes Amlodipine and Carvedilol daily  . Insomnia    takes Ambien nightly  . Memory loss   . Renal disorder    kidney stones  . Seizures ([place])   . Stroke ([place])   . Subacute delirium 10/14/2013   takes Seroquel and Dilantin nightly  . Toxic encephalopathy   . UTI (lower urinary tract infection)    takes Macrodantin daily     Significant Hospital Events   Hypoxemia  Consults:  PCCM 03/24/2019  Procedures:  None  Significant Diagnostic Tests:  CT abdomen result noted  Micro Data:  Blood cultures 03/24/2019-pending results  Antimicrobials:  Zosyn 03/24/2019>> Vancomycin 03/24/2019>>  Interim history/subjective:  Shortness of breath  Objective   Blood pressure 98/82, pulse 77, temperature 97.6 F (36.4 C), resp. rate 16, height  5' (1.524 m), SpO2 94 %.        Intake/Output Summary (Last 24 hours) at 03/24/2019 1852 Last data filed at 03/24/2019 1700 Gross per 24 hour  Intake 0 ml  Output 31 ml  Net -31 ml   There were no vitals filed for this visit.  Examination: General: Elderly lady, does not appear to be in extremis HENT: Dry oral mucosa Lungs: Rhonchi bilaterally Cardiovascular: S1-S2 appreciated Abdomen: Bowel sounds appreciated Extremities: No clubbing, no edema Neuro: Follows commands GU:   Resolved Hospital Problem list     Assessment & Plan:  Hypoxemic respiratory failure Multilobar pneumonia Concern for COVID -Testing negative for COVID  Metabolic acidosis Patient has been using Motrin for discomfort  Salicylate poisoning for which he was started on a bicarb drip  COPD She is on oxygen  currently requiring 6 L of oxygen Chest x-ray concerning with the multifocal infiltrates  Delirium with underlying dementia  Acute renal failure Nephrology following  History of blind loop syndrome  Seizure disorder  Hypertension -Antihypertensives on hold at present  Obtain chest x-ray Repeat ABG Continue bronchodilator treatments Continue bicarb replacement Replace electrolytes  We will continue to follow on the medical floor  If any signs of decompensation, consider for ICU transfer Best practice:  Diet: Heart healthy, renal diet Pain/Anxiety/Delirium protocol (if indicated):  VAP protocol (if indicated): Not applicable DVT prophylaxis: Lovenox GI prophylaxis:  Glucose control: Monitor Mobility: Bedrest Code Status: Full full code Family Communication:  Disposition:   Labs  CBC: No results for input(s): WBC, NEUTROABS, HGB, HCT, MCV, PLT in the last 168 hours.  Basic Metabolic Panel: Recent Labs  Lab 03/24/19 1730  NA 143  K 2.6*  CL 113*  CO2 16*  GLUCOSE 86  BUN 28*  CREATININE 1.98*  CALCIUM 6.0*   GFR: CrCl cannot be calculated (Unknown ideal  weight.). No results for input(s): PROCALCITON, WBC, LATICACIDVEN in the last 168 hours.  Liver Function Tests: Recent Labs  Lab 03/24/19 1730  AST 32  ALT 6  ALKPHOS 69  BILITOT 0.5  PROT 4.2*  ALBUMIN 1.9*   No results for input(s): LIPASE, AMYLASE in the last 168 hours. No results for input(s): AMMONIA in the last 168 hours.  ABG    Component Value Date/Time   PHART 7.432 03/24/2019 1612   PCO2ART 25.6 (L) 03/24/2019 1612   PO2ART 43.8 (L) 03/24/2019 1612   HCO3 16.9 (L) 03/24/2019 1612   TCO2 15 (L) 11/23/2017 0418   ACIDBASEDEF 6.7 (H) 03/24/2019 1612   O2SAT 83.6 03/24/2019 1612     Coagulation Profile: No results for input(s): INR, PROTIME in the last 168 hours.  Cardiac Enzymes: Recent Labs  Lab 03/24/19 1730  TROPONINI <0.03    HbA1C: No results found for: HGBA1C  CBG: No results for input(s): GLUCAP in the last 168 hours.  Review of Systems:   Review of Systems  Unable to perform ROS: Mental status change     Past Medical History  She,  has a past medical history of Alzheimer's dementia ([place]), Anemia, COPD (chronic obstructive pulmonary disease) ([place]), History of blood transfusion, History of MRSA infection, HLD (hyperlipidemia) (06/06/2018), HTN (hypertension) (06/06/2018), Hyperlipidemia, Hypertension, Insomnia, Memory loss, Renal disorder, Seizures ([place]), Stroke ([place]), Subacute delirium (10/14/2013), Toxic encephalopathy, and UTI (lower urinary tract infection).   Surgical History    Past Surgical History:  Procedure Laterality Date  . ABDOMINAL ANGIOGRAM N/A 01/20/2014   Procedure: ABDOMINAL ANGIOGRAM;  Surgeon: Elam Dutch, MD;  Location[facility] CATH LAB;  Service: Cardiovascular;  Laterality: N/A;  . ABDOMINAL HYSTERECTOMY    . AMPUTATION Left 02/08/2014   Procedure: AMPUTATION Left 4th & 5th toes;  Surgeon: Conrad Saybrook, MD;  Location: [place];  Service: Vascular;  Laterality: Left;  . BLADDER SURGERY    . CHOLECYSTECTOMY  1995   Gall Bladder   . ERCP N/A 10/24/2018   Procedure: ENDOSCOPIC RETROGRADE CHOLANGIOPANCREATOGRAPHY (ERCP);  Surgeon: Clarene Essex, MD;  Location: [place];  Service: Endoscopy;  Laterality: N/A;  . INSERTION OF ILIAC STENT Left 01/26/2014   Procedure: INSERTION OF ILIAC STENT- LEFT COMMON ILIAC ARTERY STENT ;  Surgeon: Conrad [place], MD;  Location: [place];  Service: Vascular;  Laterality: Left;  . KNEE SURGERY    . PANCREATIC STENT PLACEMENT  10/24/2018   Procedure: PANCREATIC STENT PLACEMENT;  Surgeon: Clarene Essex, MD;  Location: [place];  Service: Endoscopy;;  . REMOVAL OF STONES  10/24/2018   Procedure: REMOVAL OF STONES;  Surgeon: Clarene Essex, MD;  Location: [place];  Service: Endoscopy;;  balloon sweep  . SPHINCTEROTOMY  10/24/2018   Procedure: SPHINCTEROTOMY;  Surgeon: Clarene Essex, MD;  Location: [facility] ENDOSCOPY;  Service: Endoscopy;;     Social History   reports that she has been smoking cigarettes. She has been smoking about 1.00 pack per day. She has never used smokeless tobacco. She reports that she does not drink alcohol or use drugs.   Family History   Her family history includes Cancer in her  brother.   Allergies Allergies  Allergen Reactions  . Codeine Nausea And Vomiting    REACTION: intolerance  . Ace Inhibitors   . Latex Other (See Comments)    itching     Home Medications  Prior to Admission medications   Medication Sig Start Date End Date Taking? Authorizing Provider  albuterol (PROVENTIL) (2.5 MG/3ML) 0.083% nebulizer solution Take 2.5 mg by nebulization every 6 (six) hours as needed for wheezing or shortness of breath.   Yes [provider]  atorvastatin (LIPITOR) 20 MG tablet Take 20 mg by mouth daily. 11/07/18  Yes [provider]  carvedilol (COREG) 6.25 MG tablet Take 6.25 mg by mouth 2 (two) times daily. 01/12/19  Yes [provider]  cephALEXin (KEFLEX) 500 MG capsule Take 500 mg by mouth every 8 (eight) hours.   Yes [provider]  donepezil (ARICEPT) 10 MG tablet TAKE 1 BY MOUTH AT BEDTIME Patient taking differently: Take 10 mg by mouth at bedtime.  12/08/18  Yes Ward Givens, NP  doxazosin (CARDURA) 2 MG tablet Take 2 mg by mouth daily. 10/21/18  Yes [provider]  ferrous sulfate 325 (65 FE) MG tablet Take 1 tablet (325 mg total) by mouth 2 (two) times daily with a meal. 10/31/18  Yes Irene Pap N, DO  hydrOXYzine (ATARAX/VISTARIL) 25 MG tablet Take 12.5 mg by mouth daily as needed for anxiety.   Yes [provider]  mirtazapine (REMERON) 45 MG tablet Take 1 tablet by mouth at bedtime. 02/20/19  Yes [provider]  ondansetron (ZOFRAN-ODT) 4 MG disintegrating tablet Take 4 mg by mouth every 6 (six) hours as needed for nausea or vomiting.  03/10/19  Yes [provider]  phenytoin (DILANTIN) 100 MG ER capsule Take 2 capsules (200 mg total) by mouth at bedtime. 10/13/18  Yes Ward Givens, NP  sodium bicarbonate 650 MG tablet Take 1 tablet (650 mg total) by mouth 3 (three) times daily. Patient taking differently: Take 650 mg by mouth daily.  10/31/18  Yes Hall, Carole N, DO  amLODipine (NORVASC) 10 MG tablet Take 1 tablet (10 mg total) by mouth every evening. Patient not taking: Reported on 03/24/2019 10/31/18   Kayleen Memos, DO  diclofenac sodium (VOLTAREN) 1 % GEL Apply 2 g topically 3 (three) times daily. Patient not taking: Reported on 03/24/2019 10/31/18   Kayleen Memos, DO

## 2019-03-25 ENCOUNTER — Inpatient Hospital Stay (HOSPITAL_COMMUNITY): Payer: Medicare Other

## 2019-03-25 LAB — CBC WITH DIFFERENTIAL/PLATELET
Abs Immature Granulocytes: 0.04 10*3/uL (ref 0.00–0.07)
Basophils Absolute: 0 10*3/uL (ref 0.0–0.1)
Basophils Relative: 0 %
Eosinophils Absolute: 0 10*3/uL (ref 0.0–0.5)
Eosinophils Relative: 0 %
HCT: 23.3 % — ABNORMAL LOW (ref 36.0–46.0)
Hemoglobin: 7.5 g/dL — ABNORMAL LOW (ref 12.0–15.0)
Immature Granulocytes: 1 %
Lymphocytes Relative: 13 %
Lymphs Abs: 1.1 10*3/uL (ref 0.7–4.0)
MCH: 29.5 pg (ref 26.0–34.0)
MCHC: 32.2 g/dL (ref 30.0–36.0)
MCV: 91.7 fL (ref 80.0–100.0)
Monocytes Absolute: 0.5 10*3/uL (ref 0.1–1.0)
Monocytes Relative: 5 %
Neutro Abs: 6.9 10*3/uL (ref 1.7–7.7)
Neutrophils Relative %: 81 %
Platelets: 101 10*3/uL — ABNORMAL LOW (ref 150–400)
RBC: 2.54 MIL/uL — ABNORMAL LOW (ref 3.87–5.11)
RDW: 14.6 % (ref 11.5–15.5)
WBC: 8.5 10*3/uL (ref 4.0–10.5)
nRBC: 0 % (ref 0.0–0.2)

## 2019-03-25 LAB — BLOOD GAS, ARTERIAL
Acid-Base Excess: 2.4 mmol/L — ABNORMAL HIGH (ref 0.0–2.0)
Bicarbonate: 26.8 mmol/L (ref 20.0–28.0)
Drawn by: 398991
O2 Content: 10 L/min
O2 Saturation: 91.7 %
Patient temperature: 97.9
pCO2 arterial: 43.6 mmHg (ref 32.0–48.0)
pH, Arterial: 7.403 (ref 7.350–7.450)
pO2, Arterial: 64.3 mmHg — ABNORMAL LOW (ref 83.0–108.0)

## 2019-03-25 LAB — COMPREHENSIVE METABOLIC PANEL
ALT: 8 U/L (ref 0–44)
AST: 35 U/L (ref 15–41)
Albumin: 1.7 g/dL — ABNORMAL LOW (ref 3.5–5.0)
Alkaline Phosphatase: 62 U/L (ref 38–126)
Anion gap: 13 (ref 5–15)
BUN: 26 mg/dL — ABNORMAL HIGH (ref 8–23)
CO2: 26 mmol/L (ref 22–32)
Calcium: 5.8 mg/dL — CL (ref 8.9–10.3)
Chloride: 105 mmol/L (ref 98–111)
Creatinine, Ser: 1.97 mg/dL — ABNORMAL HIGH (ref 0.44–1.00)
GFR calc Af Amer: 30 mL/min — ABNORMAL LOW (ref >60)
GFR calc non Af Amer: 26 mL/min — ABNORMAL LOW (ref >60)
Glucose, Bld: 112 mg/dL — ABNORMAL HIGH (ref 70–99)
Potassium: 2.9 mmol/L — ABNORMAL LOW (ref 3.5–5.1)
Sodium: 144 mmol/L (ref 135–145)
Total Bilirubin: 0.4 mg/dL (ref 0.3–1.2)
Total Protein: 4.2 g/dL — ABNORMAL LOW (ref 6.5–8.1)

## 2019-03-25 LAB — MRSA PCR SCREENING: MRSA by PCR: NEGATIVE

## 2019-03-25 LAB — SALICYLATE LEVEL
Salicylate Lvl: 16.8 mg/dL (ref 2.8–30.0)
Salicylate Lvl: 14.2 mg/dL (ref 2.8–30.0)

## 2019-03-25 LAB — PHOSPHORUS: Phosphorus: 3.4 mg/dL (ref 2.5–4.6)

## 2019-03-25 LAB — MAGNESIUM: Magnesium: 1.2 mg/dL — ABNORMAL LOW (ref 1.7–2.4)

## 2019-03-25 MED ORDER — HYDROXYCHLOROQUINE SULFATE 200 MG PO TABS: 200.0000 mg | ORAL_TABLET | Freq: Two times a day (BID) | ORAL | Status: DC

## 2019-03-25 MED ORDER — NITROGLYCERIN 0.4 MG SL SUBL
SUBLINGUAL_TABLET | SUBLINGUAL | Status: AC
  Filled 2019-03-25: qty 1

## 2019-03-25 MED ORDER — VANCOMYCIN HCL 500 MG IV SOLR
500.0000 mg | INTRAVENOUS | Status: DC
  Filled 2019-03-25 (×2): qty 500

## 2019-03-25 MED ORDER — HYDROXYCHLOROQUINE SULFATE 200 MG PO TABS
200.0000 mg | ORAL_TABLET | Freq: Two times a day (BID) | ORAL | Status: DC
  Filled 2019-03-25: qty 1

## 2019-03-25 MED ORDER — HYDROMORPHONE HCL 1 MG/ML IJ SOLN
0.5000 mg | INTRAMUSCULAR | Status: DC | PRN
  Administered 2019-03-25 – 2019-03-26 (×4): 0.5 mg via INTRAVENOUS
  Filled 2019-03-25: qty 0.5
  Filled 2019-03-25: qty 1
  Filled 2019-03-25 (×2): qty 0.5

## 2019-03-25 MED ORDER — ENOXAPARIN SODIUM 30 MG/0.3ML ~~LOC~~ SOLN
30.0000 mg | Freq: Every morning | SUBCUTANEOUS | Status: DC
  Administered 2019-03-26 – 2019-03-30 (×5): 30 mg via SUBCUTANEOUS
  Filled 2019-03-25 (×5): qty 0.3

## 2019-03-25 MED ORDER — FUROSEMIDE 10 MG/ML IJ SOLN
INTRAMUSCULAR | Status: AC
  Filled 2019-03-25: qty 4

## 2019-03-25 MED ORDER — POTASSIUM CHLORIDE 10 MEQ/100ML IV SOLN
10.0000 meq | INTRAVENOUS | Status: AC
  Administered 2019-03-25 (×6): 10 meq via INTRAVENOUS
  Filled 2019-03-25 (×6): qty 100

## 2019-03-25 MED ORDER — CALCIUM GLUCONATE-NACL 2-0.675 GM/100ML-% IV SOLN
2.0000 g | Freq: Once | INTRAVENOUS | Status: AC
  Administered 2019-03-25: 2000 mg via INTRAVENOUS
  Filled 2019-03-25: qty 100

## 2019-03-25 MED ORDER — CALCIUM CARBONATE ANTACID 1250 MG/5ML PO SUSP
500.0000 mg | Freq: Three times a day (TID) | ORAL | Status: DC
  Administered 2019-03-26 – 2019-03-27 (×6): 500 mg
  Filled 2019-03-25 (×12): qty 5

## 2019-03-25 MED ORDER — POTASSIUM CHLORIDE CRYS ER 20 MEQ PO TBCR: 20.0000 meq | EXTENDED_RELEASE_TABLET | Freq: Three times a day (TID) | ORAL | Status: DC

## 2019-03-25 MED ORDER — HYDROXYCHLOROQUINE SULFATE 200 MG PO TABS
400.0000 mg | ORAL_TABLET | Freq: Two times a day (BID) | ORAL | Status: AC
  Administered 2019-03-25 (×2): 400 mg via ORAL
  Filled 2019-03-25 (×2): qty 2

## 2019-03-25 MED ORDER — KCL IN DEXTROSE-NACL 20-5-0.45 MEQ/L-%-% IV SOLN
INTRAVENOUS | Status: DC
  Administered 2019-03-25: 18:00:00 via INTRAVENOUS
  Filled 2019-03-25 (×2): qty 1000

## 2019-03-25 MED ORDER — SODIUM BICARBONATE 650 MG PO TABS
650.0000 mg | ORAL_TABLET | Freq: Three times a day (TID) | ORAL | Status: DC
  Administered 2019-03-25 – 2019-03-27 (×5): 650 mg via ORAL
  Filled 2019-03-25 (×6): qty 1

## 2019-03-25 MED ORDER — SODIUM CHLORIDE 0.9 % IV SOLN
500.0000 mg | INTRAVENOUS | Status: DC
  Filled 2019-03-25 (×3): qty 500

## 2019-03-25 NOTE — Progress Notes (Signed)
Pharmacy Antibiotic Note  [name] is a 68 y.o. female admitted on 03/24/2019 with AKI and AMS.  She is noted with Pharmacy has been consulted for vancomycin and zosyn dosing for possible PNA.  -SCr= 1.97 (1.07 on 10/2018), CrCl ~ 30. Culture pending. COVID test is negative    Plan: -Zosyn 3.375gm IV Q8h -Start Vanc 500mg  IV q36 (SCr= 1.98) with AUC of 498  -Monitor renal fx and adjust accordingly -Vanc trough/peak as indicated    Height: 5' (152.4 cm) Weight: 100 lb 12 oz (45.7 kg) IBW/kg (Calculated) : 45.5  Temp (24hrs), Avg:97.7 F (36.5 C), Min:97.6 F (36.4 C), Max:97.9 F (36.6 C)  Recent Labs  Lab 03/24/19 1730 03/25/19 0558  WBC  --  8.5  CREATININE 1.98* 1.97*    Estimated Creatinine Clearance: 19.9 mL/min (A) (by C-G formula based on SCr of 1.97 mg/dL (H)).    Allergies  Allergen Reactions  . Codeine Nausea And Vomiting    REACTION: intolerance  . Ace Inhibitors   . Latex Other (See Comments)    itching    Antimicrobials this admission: 4/14 vanc>> 4/14 zosyn>>  Dose adjustments this admission:   Microbiology results: 4/14 blood x1 4/14 covid - neg  Thank you for allowing pharmacy to be a part of this patient's care.  Albertina Parr, PharmD., BCPS Clinical Pharmacist Clinical phone for 03/25/19 until 3:30pm: [phone] If after 3:30pm, please refer to [facility] for unit-specific pharmacist

## 2019-03-25 NOTE — Progress Notes (Signed)
NAME:  [name], MRN:  [id number], DOB:  1951-09-19, LOS: 1 ADMISSION DATE:  03/24/2019, CONSULTATION DATE:  03/25/2019 REFERRING MD:  Dr Lorin Mercy, CHIEF COMPLAINT: Shortness of breath  Brief History   Patient presented to the hospital with abdominal pain and shortness of breath History of obstructive lung disease, hyperlipidemia, history of blind loop syndrome, peripheral vascular disease, She does have some dementia at baseline Transferred from [facility] for salicylate poisoning  History of present illness   History of chronic cough, background history of COPD She has intermittent confusion Difficult to obtain any history from patient Recent diagnosis of urinary tract infection Has been using Motrin for body aches  Past Medical History   Past Medical History:  Diagnosis Date  . Alzheimer's dementia ([place])    takes Aricept nightly  . Anemia   . COPD (chronic obstructive pulmonary disease) ([place])   . History of blood transfusion    received 2units on Feb 17,2015  . History of MRSA infection    several yrs ago  . HLD (hyperlipidemia) 06/06/2018  . HTN (hypertension) 06/06/2018  . Hyperlipidemia    takes Atorvastatin daily  . Hypertension    takes Amlodipine and Carvedilol daily  . Insomnia    takes Ambien nightly  . Memory loss   . Renal disorder    kidney stones  . Seizures ([place])   . Stroke ([place])   . Subacute delirium 10/14/2013   takes Seroquel and Dilantin nightly  . Toxic encephalopathy   . UTI (lower urinary tract infection)    takes Macrodantin daily     Significant Hospital Events   Progressive shortness of breath Hypoxemia  Consults:  pccm 03/24/2019  Procedures:  none  Significant Diagnostic Tests:  Chest x-ray with extensive multifocal infiltrates  Micro Data:  Blood cultures 03/24/2019-pending results Novel coronavirus testing negative  Antimicrobials:  Zosyn 03/24/2019>> Vancomycin 03/24/2019  Interim history/subjective:  Progressive  shortness of breath Increasing FiO2 need  Objective   Blood pressure (!) 108/58, pulse 69, temperature 97.9 F (36.6 C), temperature source Axillary, resp. rate 15, height 5' (1.524 m), weight 45.7 kg, SpO2 96 %.        Intake/Output Summary (Last 24 hours) at 03/25/2019 1338 Last data filed at 03/25/2019 7628 Gross per 24 hour  Intake 3192.53 ml  Output 750 ml  Net 2442.53 ml   Filed Weights   03/25/19 0022  Weight: 45.7 kg    Examination: General: Elderly lady, frail, chronically ill looking HENT: Dry oral mucosa Lungs: Poor air movement bilaterally Cardiovascular: S1-S2 appreciated Abdomen: Soft, bowel sounds appreciated Extremities: No clubbing, no edema Neuro: Alert, oriented to person GU: Fair urine output  Resolved Hospital Problem list     Assessment & Plan:  Acute hypoxemic respiratory failure Concern for COVID  -Tested negative for COVID -Very abnormal chest x-ray with multifocal infiltrates concerning for a viral pneumonia -Will maintain isolation precautions  Chronic obstructive pulmonary disease -Complicated by multifocal pneumonia -Bronchodilator treatment -Steroid therapy -  History of underlying dementia -May have delirium contributing -Supportive measures  Acute kidney injury -Trend electrolytes -Avoid nephrotoxic medication  Seizure disorder  Hypertension -antihypertensives on hold at present  History of blind loop syndrome  Risk of decompensation is significant and patient appears not to limited reserves from a underlying COPD There is a significant possibility of decompensated significantly enough to require ventilator support There is a component of exacerbation of her COPD Viral pneumonia is concerning based off of the chest x-ray findings  A send out coronavirus testing should be considered  Best practice:  Diet: N.p.o. from 4/14 Pain/Anxiety/Delirium protocol (if indicated): PRN oxycodone VAP protocol (if indicated):  DVT  prophylaxis: Enoxaparin GI prophylaxis: Glucose control: Monitor Mobility: Bedrest Code Status: Full code Family Communication: Defer to primary Disposition:   Labs   CBC: Recent Labs  Lab 03/25/19 0558  WBC 8.5  NEUTROABS 6.9  HGB 7.5*  HCT 23.3*  MCV 91.7  PLT 101*    Basic Metabolic Panel: Recent Labs  Lab 03/24/19 1730 03/25/19 0558 03/25/19 0811 03/25/19 1130  NA 143 144  --   --   K 2.6* 2.9*  --   --   CL 113* 105  --   --   CO2 16* 26  --   --   GLUCOSE 86 112*  --   --   BUN 28* 26*  --   --   CREATININE 1.98* 1.97*  --   --   CALCIUM 6.0* 5.8*  --   --   MG  --   --  1.2*  --   PHOS  --   --   --  3.4   GFR: Estimated Creatinine Clearance: 19.9 mL/min (A) (by C-G formula based on SCr of 1.97 mg/dL (H)). Recent Labs  Lab 03/24/19 1730 03/25/19 0558  PROCALCITON 1.17  --   WBC  --  8.5    Liver Function Tests: Recent Labs  Lab 03/24/19 1730 03/25/19 0558  AST 32 35  ALT 6 8  ALKPHOS 69 62  BILITOT 0.5 0.4  PROT 4.2* 4.2*  ALBUMIN 1.9* 1.7*   No results for input(s): LIPASE, AMYLASE in the last 168 hours. No results for input(s): AMMONIA in the last 168 hours.  ABG    Component Value Date/Time   PHART 7.403 03/25/2019 1013   PCO2ART 43.6 03/25/2019 1013   PO2ART 64.3 (L) 03/25/2019 1013   HCO3 26.8 03/25/2019 1013   TCO2 15 (L) 11/23/2017 0418   ACIDBASEDEF 5.1 (H) 03/24/2019 2105   O2SAT 91.7 03/25/2019 1013     Coagulation Profile: No results for input(s): INR, PROTIME in the last 168 hours.  Cardiac Enzymes: Recent Labs  Lab 03/24/19 1730  TROPONINI <0.03    HbA1C: No results found for: HGBA1C  CBG: No results for input(s): GLUCAP in the last 168 hours.  Review of Systems:   Review of Systems  Unable to perform ROS: Mental acuity  Constitutional: Positive for malaise/fatigue.  Respiratory: Positive for shortness of breath.   Gastrointestinal: Positive for abdominal pain.   Past Medical History  She,  has a  past medical history of Alzheimer's dementia ([place]), Anemia, COPD (chronic obstructive pulmonary disease) ([place]), History of blood transfusion, History of MRSA infection, HLD (hyperlipidemia) (06/06/2018), HTN (hypertension) (06/06/2018), Hyperlipidemia, Hypertension, Insomnia, Memory loss, Renal disorder, Seizures ([place]), Stroke ([place]), Subacute delirium (10/14/2013), Toxic encephalopathy, and UTI (lower urinary tract infection).   Surgical History    Past Surgical History:  Procedure Laterality Date  . ABDOMINAL ANGIOGRAM N/A 01/20/2014   Procedure: ABDOMINAL ANGIOGRAM;  Surgeon: Elam Dutch, MD;  Location: [facility] CATH LAB;  Service: Cardiovascular;  Laterality: N/A;  . ABDOMINAL HYSTERECTOMY    . AMPUTATION Left 02/08/2014   Procedure: AMPUTATION Left 4th & 5th toes;  Surgeon: Conrad Gardena, MD;  Location: [place];  Service: Vascular;  Laterality: Left;  . BLADDER SURGERY    . CHOLECYSTECTOMY  1995   Gall Bladder  . ERCP N/A 10/24/2018   Procedure:  ENDOSCOPIC RETROGRADE CHOLANGIOPANCREATOGRAPHY (ERCP);  Surgeon: Clarene Essex, MD;  Location: [place];  Service: Endoscopy;  Laterality: N/A;  . INSERTION OF ILIAC STENT Left 01/26/2014   Procedure: INSERTION OF ILIAC STENT- LEFT COMMON ILIAC ARTERY STENT ;  Surgeon: Conrad Lewiston, MD;  Location: [place];  Service: Vascular;  Laterality: Left;  . KNEE SURGERY    . PANCREATIC STENT PLACEMENT  10/24/2018   Procedure: PANCREATIC STENT PLACEMENT;  Surgeon: Clarene Essex, MD;  Location: [place];  Service: Endoscopy;;  . REMOVAL OF STONES  10/24/2018   Procedure: REMOVAL OF STONES;  Surgeon: Clarene Essex, MD;  Location: [place];  Service: Endoscopy;;  balloon sweep  . SPHINCTEROTOMY  10/24/2018   Procedure: SPHINCTEROTOMY;  Surgeon: Clarene Essex, MD;  Location: [facility] ENDOSCOPY;  Service: Endoscopy;;     Social History   reports that she has been smoking cigarettes. She has been smoking about 1.00 pack per day. She has never used smokeless tobacco. She  reports that she does not drink alcohol or use drugs.   Family History   Her family history includes Cancer in her brother.   Allergies Allergies  Allergen Reactions  . Codeine Nausea And Vomiting    REACTION: intolerance  . Ace Inhibitors   . Latex Other (See Comments)    itching     Critical care time: 30 minutes of critical care time spent evaluating patient, reviewing records, formulating plan of care

## 2019-03-25 NOTE — Progress Notes (Signed)
RT NOTE: RT obtained ABG on patient this morning @1013  on Partial rebreather (pH 7.40, CO2 43.6, PO2 64.3, HCO3 26.8) and RT showed Dr. Vaughan Browner, MD @3 :00pm and asked if MD wanted a new ABG or any changes and MD stated no changes or need for new ABG. RT will continue to monitor.

## 2019-03-25 NOTE — Progress Notes (Signed)
PROGRESS NOTE  [name]:[id number] DOB: 1951-06-12 DOA: 03/24/2019 PCP: Marco Collie, MD  HPI/Recap of past 24 hours:  [name] is a 68 y.o. 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  She is unable to provide history.  I called and spoke with her son, [name].  Her son is her caregiver.  Sunday, she didn't feel well - he thought she had a UTI.  He called urology (Dr. Gerlene Burdock) yesterday and he diagnosed a bad UTI.  Last night, "she just didn't look right."  She was weak and SOB.  He gave her breathing treatments and neb treatments.  She didn't sleep.  She does not have any medication for pain - previously using oxycontin 5 mg.  She has been taking Bayer back and body aches.  She took 2 tabs at a time 2 times during the day, provided by a caregiver.  She does have it in her room and could have taken more.  She also takes Motrin.  No one else is sick in the home.  Her son and nephew both have asthma and neither of them has been ill.  She has not tested for COVID.  No fever but she was clammy last night.  She has COPD and has chronic cough but this was better yesterday.  No GI symptoms.  She is conversant at baseline but has dementia - she recognizes her son but does not know the year, president.  She is oriented to person and place at baseline.  She was intermittently confused yesterday.  She would want to be full code.   ED Course: Transfer from [facility] ER, discussed with Dr. Sabra Heck:  He reports that she was altered, tachypneic, acidotic with pH 7.0. Salicylate level high. Given HCO3 with drip, level improved to 33. pH up to 7.15. She is still having tachypnea, wheezing  with underlying COPD. She got a neb but CXR showed ground glass, ?COVID. Has been getting MDIs, sats 95%, persistent tachypnea (?related to ASA overdose). HR 90, BP stable 132/60. Repeat BMP with K+ 2.6, repleting. Diffuse wheezing on exam. She does not currently need intubation. If she becomes more hypoxic and not responding to MDIs, may need more aggressive bronchodilator therapy. Appears to still be appropriate for SDU.  03/25/19: Patient seen and examined at her bedside.  She became severely hypoxic early this morning when she removed her nonrebreather with saturation dropping in the low 80s on room air.  Once she was replaced on nonrebreather oxygen saturation increased to the mid 90s.  Patient is mentally altered and unable to provide a history.  She moans during the entire encounter.  1 dose of IV Dilaudid 0.5 mg given once with improvement.  Assessment/Plan: Principal Problem:   Acute on chronic respiratory failure with hypoxia (HCC) Active Problems:   COPD (chronic obstructive pulmonary disease) (HCC)   Blind loop syndrome   Subacute delirium   Acute renal failure (HCC)   Seizure disorder (HCC)   HTN (hypertension)  Acute hypoxic respiratory failure with suspected COVID-19 infection Significantly hypoxic on room air Currently requiring nonrebreather to maintain O2 saturation greater than 90% Continue MDI COVID-19 negative Independently reviewed chest x-ray done on admission which  reveals significant bilateral multilobular infiltrates Elevated LDH, CRP and d-dimer Repeat above markers Will start azithromycin and Plaquenil due to high suspicion for COVID-19 Consider repeating COVID-19 testing Obtain twelve-lead EKG for QTC assessment Maintain O2 saturation greater than 92% Discussed with pulmonology Dr. Ander Slade.  Highly appreciate recommendations.  Acute metabolic encephalopathy suspect secondary to pulmonary infection Reorient as indicated  Hypokalemia Potassium 2.9  Repleted with IV potassium Obtain magnesium level  Anion gap metabolic acidosis secondary to salicylate overdose Post isotonic bicarb drip Chemistry bicarb is normalized Anion gap closing Repeat BMP in the morning  Salicylate overdose Salicylate level normalizing  AKI on CKD 3 Baseline creatinine 1 with GFR 52 Presented with creatinine of 1.9 next with GFR of 26 Monitor urine output Avoid nephrotoxic agents Discontinue IV fluid due to hypoxia Nephrology following.  Highly appreciate recommendations.  Hypotension, improving Suspect from infective process Procalcitonin elevated at 1.17 Continue IV antibiotics  Chronic normocytic anemia No sign of overt bleeding Hemoglobin 7.5  Transfuse with hemoglobin less than 7  Chronic thrombocytopenia Platelet 101K No sign of overt bleeding Continue to monitor Repeat CBC in the morning  Elevated d-dimer Unclear etiology  No lower extremity edema or sign of tenderness on palpation Continue subcu Lovenox daily     Code Status: Full code  Family Communication: None at bedside  Disposition Plan: Possible discharge to home in 3 to 4 days when hemodynamically stable   Consultants:  PCCM, nephrology  Procedures: None  Antimicrobials:  IV vancomycin and Zosyn, IV azithromycin  DVT prophylaxis: Subcu Lovenox daily   Objective: Vitals:   03/25/19 0141 03/25/19 0208 03/25/19 0321 03/25/19 0805  BP:   (!) 114/51 (!) 121/49  Pulse:   81 80  Resp:   (!) 24 20  Temp:   97.7 F (36.5 C) 97.9 F (36.6 C)  TempSrc:   Oral Axillary  SpO2: 100% 97% 98% 100%  Weight:      Height:        Intake/Output Summary (Last 24 hours) at 03/25/2019 1041 Last data filed at 03/25/2019 1610 Gross per 24 hour  Intake 3192.53 ml  Output 781 ml  Net 2411.53 ml   Filed Weights   03/25/19 0022  Weight: 45.7 kg    Exam:  . General: 68 y.o. year-old female frail appears uncomfortable with moaning throughout visit, confused. .  Cardiovascular: Regular rate and rhythm with no rubs or gallops.  No thyromegaly or JVD noted.   Marland Kitchen Respiratory: Diffuse rales bilaterally with poor inspiratory effort.   . Abdomen: Soft nontender nondistended with normal bowel sounds x4 quadrants. . Musculoskeletal: No lower extremity edema. 2/4 pulses in all 4 extremities. Marland Kitchen Psychiatry: Unable to assess mood due to altered mental status.   Data Reviewed: CBC: Recent Labs  Lab 03/25/19 0558  WBC 8.5  NEUTROABS 6.9  HGB 7.5*  HCT 23.3*  MCV 91.7  PLT 960*   Basic Metabolic Panel: Recent Labs  Lab 03/24/19 1730 03/25/19 0558  NA 143 144  K 2.6* 2.9*  CL 113* 105  CO2 16* 26  GLUCOSE 86 112*  BUN 28* 26*  CREATININE 1.98* 1.97*  CALCIUM 6.0* 5.8*   GFR: Estimated Creatinine Clearance: 19.9 mL/min (A) (by C-G formula based on SCr of 1.97 mg/dL (H)). Liver Function Tests: Recent Labs  Lab 03/24/19 1730 03/25/19 0558  AST 32 35  ALT 6 8  ALKPHOS 69 62  BILITOT 0.5 0.4  PROT 4.2* 4.2*  ALBUMIN 1.9* 1.7*   No results for input(s):  LIPASE, AMYLASE in the last 168 hours. No results for input(s): AMMONIA in the last 168 hours. Coagulation Profile: No results for input(s): INR, PROTIME in the last 168 hours. Cardiac Enzymes: Recent Labs  Lab 03/24/19 1730  TROPONINI <0.03   BNP (last 3 results) No results for input(s): PROBNP in the last 8760 hours. HbA1C: No results for input(s): HGBA1C in the last 72 hours. CBG: No results for input(s): GLUCAP in the last 168 hours. Lipid Profile: No results for input(s): CHOL, HDL, LDLCALC, TRIG, CHOLHDL, LDLDIRECT in the last 72 hours. Thyroid Function Tests: No results for input(s): TSH, T4TOTAL, FREET4, T3FREE, THYROIDAB in the last 72 hours. Anemia Panel: Recent Labs    03/24/19 1730  FERRITIN 151   Urine analysis:    Component Value Date/Time   COLORURINE YELLOW 03/24/2019 1400   APPEARANCEUR CLOUDY (A) 03/24/2019 1400   LABSPEC 1.019 03/24/2019 1400    PHURINE 5.0 03/24/2019 1400   GLUCOSEU 150 (A) 03/24/2019 1400   HGBUR MODERATE (A) 03/24/2019 1400   BILIRUBINUR NEGATIVE 03/24/2019 1400   KETONESUR NEGATIVE 03/24/2019 1400   PROTEINUR 30 (A) 03/24/2019 1400   UROBILINOGEN 0.2 01/24/2014 0500   NITRITE NEGATIVE 03/24/2019 1400   LEUKOCYTESUR LARGE (A) 03/24/2019 1400   Sepsis Labs: @LABRCNTIP (procalcitonin:4,lacticidven:4)  ) Recent Results (from the past 240 hour(s))  SARS Coronavirus 2 [employer] order, Performed in [place] hospital lab)     Status: None   Collection Time: 03/24/19  2:09 PM  Result Value Ref Range Status   SARS Coronavirus 2 NEGATIVE NEGATIVE Final    Comment: (NOTE) If result is NEGATIVE SARS-CoV-2 target nucleic acids are NOT DETECTED. The SARS-CoV-2 RNA is generally detectable in upper and lower  respiratory specimens during the acute phase of infection. The lowest  concentration of SARS-CoV-2 viral copies this assay can detect is 250  copies / mL. A negative result does not preclude SARS-CoV-2 infection  and should not be used as the sole basis for treatment or other  patient management decisions.  A negative result may occur with  improper specimen collection / handling, submission of specimen other  than nasopharyngeal swab, presence of viral mutation(s) within the  areas targeted by this assay, and inadequate number of viral copies  (<250 copies / mL). A negative result must be combined with clinical  observations, patient history, and epidemiological information. If result is POSITIVE SARS-CoV-2 target nucleic acids are DETECTED. The SARS-CoV-2 RNA is generally detectable in upper and lower  respiratory specimens dur ing the acute phase of infection.  Positive  results are indicative of active infection with SARS-CoV-2.  Clinical  correlation with patient history and other diagnostic information is  necessary to determine patient infection status.  Positive results do  not rule out bacterial  infection or co-infection with other viruses. If result is PRESUMPTIVE POSTIVE SARS-CoV-2 nucleic acids MAY BE PRESENT.   A presumptive positive result was obtained on the submitted specimen  and confirmed on repeat testing.  While 2019 novel coronavirus  (SARS-CoV-2) nucleic acids may be present in the submitted sample  additional confirmatory testing may be necessary for epidemiological  and / or clinical management purposes  to differentiate between  SARS-CoV-2 and other Sarbecovirus currently known to infect humans.  If clinically indicated additional testing with an alternate test  methodology [phone]) is advised. The SARS-CoV-2 RNA is generally  detectable in upper and lower respiratory sp ecimens during the acute  phase of infection. The expected result is Negative. Fact Sheet for  Patients:  StrictlyIdeas.no Fact Sheet for Healthcare Providers: BankingDealers.co.za This test is not yet approved or cleared by the [place] FDA and has been authorized for detection and/or diagnosis of SARS-CoV-2 by FDA under an Emergency Use Authorization (EUA).  This EUA will remain in effect (meaning this test can be used) for the duration of the COVID-19 declaration under Section 564(b)(1) of the Act, 21 U.S.C. section 360bbb-3(b)(1), unless the authorization is terminated or revoked sooner. Performed at [facility], [address], [place], [place] [address]       Studies: Dg Chest Port 1 View  Result Date: 03/24/2019 CLINICAL DATA:  Shortness of breath. Hypertension. Decreased oxygen saturation. EXAM: PORTABLE CHEST 1 VIEW COMPARISON:  03/24/2019 at 1:29 a.m. FINDINGS: 2007 hours. Right PICC line tip at mid right atrium. Patient rotated left. Normal heart size. Atherosclerosis in the transverse aorta. No pleural effusion or pneumothorax. Significantly worsened aeration, with relatively diffuse slightly perihilar predominant right  greater than left interstitial and airspace disease. Remote right rib fractures. IMPRESSION: Markedly worsened aeration, with relatively diffuse interstitial and airspace disease. Given time course since the prior exam, favor aspiration or alveolar edema. Progression of infection could look similar. Electronically Signed   By: Abigail Miyamoto M.D.   On: 03/24/2019 19:48   [place] Ekg Site Rite  Result Date: 03/24/2019 If Site Rite image not attached, placement could not be confirmed due to current cardiac rhythm.   Scheduled Meds: . albuterol  2 puff Inhalation QID  . calcium carbonate (dosed in mg elemental calcium)  500 mg of elemental calcium Per Tube TID AC & HS  . docusate sodium  100 mg Oral BID  . enoxaparin (LOVENOX) injection  40 mg Subcutaneous q morning - 10a  . methylPREDNISolone (SOLU-MEDROL) injection  40 mg Intravenous Q8H  . phenytoin (DILANTIN) IV  65 mg Intravenous Q8H  . sodium bicarbonate  650 mg Oral TID  . umeclidinium-vilanterol  1 puff Inhalation Daily    Continuous Infusions: . levETIRAcetam 500 mg (03/25/19 0956)  . piperacillin-tazobactam (ZOSYN)  IV 3.375 g (03/25/19 0929)  . potassium chloride 10 mEq (03/25/19 0944)  . [START ON 03/26/2019] vancomycin       LOS: 1 day     Kayleen Memos, MD [facility] Pager [phone]  If 7PM-7AM, please contact night-coverage [email or web address] Password [facility] 03/25/2019, 10:41 AM

## 2019-03-25 NOTE — Progress Notes (Signed)
RT NOTE: RT attempted ABG X2 without success. RT called for another RT. Second RT will attempt again as soon as they can. RT will continue to monitor as needed.

## 2019-03-25 NOTE — Progress Notes (Addendum)
Initial Nutrition Assessment   RD working remotely  [place]:   Not applicable  INTERVENTION:    Advance diet as medically appropriate, add supplements when/as able  NUTRITION DIAGNOSIS:   Inadequate oral intake related to inability to eat as evidenced by NPO status  GOAL:   Patient will meet greater than or equal to 90% of their needs  MONITOR:   Diet advancement, PO intake, Labs, Skin, Weight trends, I & O's  REASON FOR ASSESSMENT:   Malnutrition Screening Tool  ASSESSMENT:   68 y.o. Female with PMH significant of R hemicolectomy; dementia; stage 3 CKD; COPD; presenting with abdominal pain and SOB; transferred from [facility] ER.   Admit dx: COVID-19 [U07.1, J98.8] Pneumonia [J18.9]  RD unable to obtain nutrition hx from pt. She is currenlty on HFNC; being r/o for COVID-19. Pt seen per Clinical Nutrition 05/2018; identified with malnutrition.  Pt with poor PO intake at baseline due to lack of appetite. Per RD assessment 06/06/18 pt consumed one meal per day. Pt also reported to RD she is very immobile at baseline.  Pt lives with her son and granddaughter. Labs & medications reviewed. K 2.9 (L). Pt is currently NPO.  NUTRITION - FOCUSED PHYSICAL EXAM:  Unable to complete at this time, however, suspect malnutrition ongoing  Diet Order:   Diet Order            Diet NPO time specified  Diet effective now             EDUCATION NEEDS:   Not appropriate for education at this time  Skin:  Skin Assessment: Reviewed RN Assessment  Last BM:  4/14  Height:   Ht Readings from Last 1 Encounters:  03/24/19 5' (1.524 m)   Weight:   Wt Readings from Last 1 Encounters:  03/25/19 45.7 kg   BMI:  Body mass index is 19.68 kg/m.  Estimated Nutritional Needs:   Kcal:  1300-1500  Protein:  60-75 gm  Fluid:  >/= 1.5 L  Arthur Holms, RD, LDN Pager #: [phone] After-Hours Pager #: [phone]

## 2019-03-25 NOTE — Progress Notes (Addendum)
Patient ID: [name], female   DOB: 03-Dec-1951, 68 y.o.   MRN: [id number] [facility] Progress Note   Assessment/ Plan:   1. Acute kidney Injury on chronic kidney disease stage III: Suspected to be hemodynamically mediated versus associated with preceding use of NSAIDs.  Improved with intravenous fluids overnight, she remains nonoliguric and without any indications for renal replacement therapy.  Will discontinue sodium bicarbonate drip and leave her on lactated Ringer's at this time for maintenance fluids.  Caution is raised regarding her respiratory status and LR will be discontinued later today. 2.  Hypokalemia: Likely total body deficit and shifting in the setting of ongoing sodium bicarbonate drip.  Will replace via oral route.  Check magnesium level. 3.  Anion gap metabolic acidosis: Most likely secondary to acute kidney injury with a contributory role from salicylic acid.  Improved with sodium bicarbonate drip/urine output. 4.  Salicylate toxicity: Salicylate levels decreasing with sodium bicarbonate drip, continue to monitor. 5.  Hypocalcemia: Suspect total body deficit from chronic malnutrition and shifting in the setting of sodium bicarbonate drip.  Ongoing intravenous calcium replacement and now start her on oral calcium replacement.  Check magnesium level and 25 hydroxy vitamin D level. 6.  Atypical pneumonia: Ruled out for COVID-19, on broad-spectrum antimicrobial coverage.  Agree with plans for adding azithromycin, vitamin C and zinc.  Subjective:   Earlier complained of feeling tired and some shortness of breath to the nurse   Objective:   BP (!) 114/51 (BP Location: Left Arm)   Pulse 81   Temp 97.7 F (36.5 C) (Oral)   Resp (!) 24   Ht 5' (1.524 m)   Wt 45.7 kg   SpO2 98%   BMI 19.68 kg/m   Intake/Output Summary (Last 24 hours) at 03/25/2019 0759 Last data filed at 03/25/2019 9292 Gross per 24 hour  Intake 3192.53 ml  Output 781 ml  Net 2411.53 ml    Weight change:   Physical Exam: Patient not examined today due to COVID-19 precautions. Discussed with Dr. Nevada Crane  Imaging: Dg Chest Port 1 View  Result Date: 03/24/2019 CLINICAL DATA:  Shortness of breath. Hypertension. Decreased oxygen saturation. EXAM: PORTABLE CHEST 1 VIEW COMPARISON:  03/24/2019 at 1:29 a.m. FINDINGS: 2007 hours. Right PICC line tip at mid right atrium. Patient rotated left. Normal heart size. Atherosclerosis in the transverse aorta. No pleural effusion or pneumothorax. Significantly worsened aeration, with relatively diffuse slightly perihilar predominant right greater than left interstitial and airspace disease. Remote right rib fractures. IMPRESSION: Markedly worsened aeration, with relatively diffuse interstitial and airspace disease. Given time course since the prior exam, favor aspiration or alveolar edema. Progression of infection could look similar. Electronically Signed   By: Abigail Miyamoto M.D.   On: 03/24/2019 19:48   [place] Ekg Site Rite  Result Date: 03/24/2019 If Site Rite image not attached, placement could not be confirmed due to current cardiac rhythm.   Labs: BMET Recent Labs  Lab 03/24/19 1730 03/25/19 0558  NA 143 144  K 2.6* 2.9*  CL 113* 105  CO2 16* 26  GLUCOSE 86 112*  BUN 28* 26*  CREATININE 1.98* 1.97*  CALCIUM 6.0* 5.8*   CBC No results for input(s): WBC, NEUTROABS, HGB, HCT, MCV, PLT in the last 168 hours.  Medications:    . albuterol  2 puff Inhalation QID  . docusate sodium  100 mg Oral BID  . enoxaparin (LOVENOX) injection  40 mg Subcutaneous q morning - 10a  . methylPREDNISolone (SOLU-MEDROL)  injection  40 mg Intravenous Q8H  . phenytoin (DILANTIN) IV  65 mg Intravenous Q8H  . umeclidinium-vilanterol  1 puff Inhalation Daily  . vancomycin variable dose per unstable renal function (pharmacist dosing)   Does not apply See admin instructions      Elmarie Shiley, MD 03/25/2019, 7:59 AM

## 2019-03-26 ENCOUNTER — Inpatient Hospital Stay (HOSPITAL_COMMUNITY): Payer: Medicare Other

## 2019-03-26 ENCOUNTER — Encounter (HOSPITAL_COMMUNITY): Payer: Self-pay | Admitting: *Deleted

## 2019-03-26 DIAGNOSIS — F0280 Dementia in other diseases classified elsewhere without behavioral disturbance: Secondary | ICD-10-CM

## 2019-03-26 DIAGNOSIS — N179 Acute kidney failure, unspecified: Secondary | ICD-10-CM

## 2019-03-26 DIAGNOSIS — R0602 Shortness of breath: Secondary | ICD-10-CM

## 2019-03-26 DIAGNOSIS — G309 Alzheimer's disease, unspecified: Secondary | ICD-10-CM

## 2019-03-26 DIAGNOSIS — N170 Acute kidney failure with tubular necrosis: Secondary | ICD-10-CM

## 2019-03-26 DIAGNOSIS — J810 Acute pulmonary edema: Secondary | ICD-10-CM

## 2019-03-26 DIAGNOSIS — G301 Alzheimer's disease with late onset: Secondary | ICD-10-CM

## 2019-03-26 DIAGNOSIS — N183 Chronic kidney disease, stage 3 (moderate): Secondary | ICD-10-CM

## 2019-03-26 DIAGNOSIS — J441 Chronic obstructive pulmonary disease with (acute) exacerbation: Secondary | ICD-10-CM

## 2019-03-26 DIAGNOSIS — F05 Delirium due to known physiological condition: Secondary | ICD-10-CM

## 2019-03-26 HISTORY — DX: Chronic obstructive pulmonary disease with (acute) exacerbation: J44.1

## 2019-03-26 HISTORY — DX: Acute kidney failure, unspecified: N17.9

## 2019-03-26 HISTORY — DX: Dementia in other diseases classified elsewhere, unspecified severity, without behavioral disturbance, psychotic disturbance, mood disturbance, and anxiety: F02.80

## 2019-03-26 LAB — COMPREHENSIVE METABOLIC PANEL
ALT: 7 U/L (ref 0–44)
AST: 29 U/L (ref 15–41)
Albumin: 1.8 g/dL — ABNORMAL LOW (ref 3.5–5.0)
Alkaline Phosphatase: 55 U/L (ref 38–126)
Anion gap: 9 (ref 5–15)
BUN: 23 mg/dL (ref 8–23)
CO2: 24 mmol/L (ref 22–32)
Calcium: 6.3 mg/dL — CL (ref 8.9–10.3)
Chloride: 108 mmol/L (ref 98–111)
Creatinine, Ser: 1.72 mg/dL — ABNORMAL HIGH (ref 0.44–1.00)
GFR calc Af Amer: 35 mL/min — ABNORMAL LOW (ref >60)
GFR calc non Af Amer: 30 mL/min — ABNORMAL LOW (ref >60)
Glucose, Bld: 274 mg/dL — ABNORMAL HIGH (ref 70–99)
Potassium: 4.3 mmol/L (ref 3.5–5.1)
Sodium: 141 mmol/L (ref 135–145)
Total Bilirubin: 0.4 mg/dL (ref 0.3–1.2)
Total Protein: 4.3 g/dL — ABNORMAL LOW (ref 6.5–8.1)

## 2019-03-26 LAB — CBC WITH DIFFERENTIAL/PLATELET
Abs Immature Granulocytes: 0.07 10*3/uL (ref 0.00–0.07)
Basophils Absolute: 0 10*3/uL (ref 0.0–0.1)
Basophils Relative: 0 %
Eosinophils Absolute: 0 10*3/uL (ref 0.0–0.5)
Eosinophils Relative: 0 %
HCT: 23.5 % — ABNORMAL LOW (ref 36.0–46.0)
Hemoglobin: 7.2 g/dL — ABNORMAL LOW (ref 12.0–15.0)
Immature Granulocytes: 1 %
Lymphocytes Relative: 6 %
Lymphs Abs: 0.4 10*3/uL — ABNORMAL LOW (ref 0.7–4.0)
MCH: 29.9 pg (ref 26.0–34.0)
MCHC: 30.6 g/dL (ref 30.0–36.0)
MCV: 97.5 fL (ref 80.0–100.0)
Monocytes Absolute: 0.3 10*3/uL (ref 0.1–1.0)
Monocytes Relative: 5 %
Neutro Abs: 5.4 10*3/uL (ref 1.7–7.7)
Neutrophils Relative %: 88 %
Platelets: 98 10*3/uL — ABNORMAL LOW (ref 150–400)
RBC: 2.41 MIL/uL — ABNORMAL LOW (ref 3.87–5.11)
RDW: 14.6 % (ref 11.5–15.5)
WBC: 6.2 10*3/uL (ref 4.0–10.5)
nRBC: 0 % (ref 0.0–0.2)

## 2019-03-26 LAB — GLUCOSE, CAPILLARY
Glucose-Capillary: 98 mg/dL (ref 70–99)
Glucose-Capillary: 121 mg/dL — ABNORMAL HIGH (ref 70–99)

## 2019-03-26 LAB — RESPIRATORY PANEL BY PCR

## 2019-03-26 LAB — D-DIMER, QUANTITATIVE: D-Dimer, Quant: 1.65 ug/mL-FEU — ABNORMAL HIGH (ref 0.00–0.50)

## 2019-03-26 LAB — POCT I-STAT 7, (LYTES, BLD GAS, ICA,H+H)
Bicarbonate: 26.6 mmol/L (ref 20.0–28.0)
Calcium, Ion: 1.05 mmol/L — ABNORMAL LOW (ref 1.15–1.40)
HCT: 22 % — ABNORMAL LOW (ref 36.0–46.0)
Hemoglobin: 7.5 g/dL — ABNORMAL LOW (ref 12.0–15.0)
O2 Saturation: 99 %
Patient temperature: 98
Potassium: 4 mmol/L (ref 3.5–5.1)
Sodium: 144 mmol/L (ref 135–145)
TCO2: 28 mmol/L (ref 22–32)
pCO2 arterial: 50.3 mmHg — ABNORMAL HIGH (ref 32.0–48.0)
pH, Arterial: 7.331 — ABNORMAL LOW (ref 7.350–7.450)
pO2, Arterial: 134 mmHg — ABNORMAL HIGH (ref 83.0–108.0)

## 2019-03-26 LAB — NOVEL CORONAVIRUS, NAA (HOSP ORDER, SEND-OUT TO REF LAB; TAT 18-24 HRS): SARS-CoV-2, NAA: NOT DETECTED

## 2019-03-26 LAB — MAGNESIUM: Magnesium: 1.4 mg/dL — ABNORMAL LOW (ref 1.7–2.4)

## 2019-03-26 LAB — VITAMIN D 25 HYDROXY (VIT D DEFICIENCY, FRACTURES): Vit D, 25-Hydroxy: 5 ng/mL — ABNORMAL LOW (ref 30.0–100.0)

## 2019-03-26 LAB — TROPONIN I
Troponin I: <0.03 ng/mL (ref <0.03)
Troponin I: <0.03 ng/mL (ref <0.03)
Troponin I: <0.03 ng/mL (ref <0.03)
Troponin I: <0.03 ng/mL (ref <0.03)

## 2019-03-26 LAB — LACTATE DEHYDROGENASE: LDH: 442 U/L — ABNORMAL HIGH (ref 98–192)

## 2019-03-26 LAB — C-REACTIVE PROTEIN: CRP: 18 mg/dL — ABNORMAL HIGH (ref <1.0)

## 2019-03-26 MED ORDER — ALBUTEROL SULFATE (2.5 MG/3ML) 0.083% IN NEBU: 2.5000 mg | INHALATION_SOLUTION | Freq: Four times a day (QID) | RESPIRATORY_TRACT | Status: DC | PRN

## 2019-03-26 MED ORDER — MAGNESIUM OXIDE 400 (241.3 MG) MG PO TABS
400.0000 mg | ORAL_TABLET | Freq: Two times a day (BID) | ORAL | Status: DC
  Administered 2019-03-26 – 2019-03-30 (×9): 400 mg via ORAL
  Filled 2019-03-26 (×10): qty 1

## 2019-03-26 MED ORDER — ALBUTEROL SULFATE HFA 108 (90 BASE) MCG/ACT IN AERS: 2.0000 | INHALATION_SPRAY | Freq: Four times a day (QID) | RESPIRATORY_TRACT | Status: DC | PRN

## 2019-03-26 MED ORDER — IPRATROPIUM-ALBUTEROL 0.5-2.5 (3) MG/3ML IN SOLN
3.0000 mL | Freq: Four times a day (QID) | RESPIRATORY_TRACT | Status: DC
  Administered 2019-03-26 – 2019-03-28 (×8): 3 mL via RESPIRATORY_TRACT
  Filled 2019-03-26 (×7): qty 3

## 2019-03-26 MED ORDER — DONEPEZIL HCL 10 MG PO TABS
10.0000 mg | ORAL_TABLET | Freq: Every day | ORAL | Status: DC
  Administered 2019-03-26 – 2019-03-29 (×4): 10 mg via ORAL
  Filled 2019-03-26 (×5): qty 1

## 2019-03-26 MED ORDER — MAGNESIUM SULFATE 2 GM/50ML IV SOLN
2.0000 g | Freq: Once | INTRAVENOUS | Status: AC
  Administered 2019-03-26: 2 g via INTRAVENOUS
  Filled 2019-03-26: qty 50

## 2019-03-26 MED ORDER — MIRTAZAPINE 15 MG PO TABS
45.0000 mg | ORAL_TABLET | Freq: Every day | ORAL | Status: DC
  Administered 2019-03-26 – 2019-03-29 (×4): 45 mg via ORAL
  Filled 2019-03-26: qty 3
  Filled 2019-03-26: qty 1
  Filled 2019-03-26 (×4): qty 3

## 2019-03-26 MED ORDER — INSULIN ASPART 100 UNIT/ML ~~LOC~~ SOLN: 0.0000 [IU] | Freq: Three times a day (TID) | SUBCUTANEOUS | Status: DC

## 2019-03-26 MED ORDER — FAMOTIDINE IN NACL 20-0.9 MG/50ML-% IV SOLN
20.0000 mg | INTRAVENOUS | Status: DC
  Administered 2019-03-26: 20 mg via INTRAVENOUS
  Filled 2019-03-26 (×3): qty 50

## 2019-03-26 MED ORDER — INSULIN ASPART 100 UNIT/ML ~~LOC~~ SOLN: 0.0000 [IU] | Freq: Every day | SUBCUTANEOUS | Status: DC

## 2019-03-26 MED ORDER — FENTANYL CITRATE (PF) 100 MCG/2ML IJ SOLN
25.0000 ug | Freq: Once | INTRAMUSCULAR | Status: AC
  Administered 2019-03-26: 25 ug via INTRAVENOUS
  Filled 2019-03-26: qty 2

## 2019-03-26 MED ORDER — DEXTROSE-NACL 5-0.45 % IV SOLN
INTRAVENOUS | Status: DC
  Administered 2019-03-26: 16:00:00 via INTRAVENOUS

## 2019-03-26 NOTE — Progress Notes (Signed)
Patient ID: [name], female   DOB: Apr 07, 1951, 68 y.o.   MRN: [id number] [facility] Progress Note   Assessment/ Plan:   1. Acute kidney Injury on chronic kidney disease stage III: Suspected to be hemodynamically mediated versus associated with preceding use of NSAIDs.  Improved with intravenous fluids and renal function appears to now be back to baseline-remains on maintenance fluid with D5 half-normal and added potassium (recommend continuing this with close monitoring of potassium levels especially with suboptimal oral intake).  2.  Hypokalemia: Likely total body deficit and corrected with intravenous and oral replacement.  Hypomagnesemia noted-ordered magnesium supplement. 3.  Anion gap metabolic acidosis: Most likely secondary to acute kidney injury with a contributory role from salicylic acid.  Corrected with isotonic sodium bicarbonate and improving renal function. 4.  Salicylate toxicity: Salicylate levels corrected with sodium bicarbonate drip. 5.  Hypocalcemia: Corrected calcium 8.0.  Likely associated with chronic malnutrition/total body deficit.  Continue oral replacement. 6.  Atypical pneumonia: Severe bilateral pneumonia likely viral noted on chest imaging; COVID-19 ruled out.  Ongoing close monitoring in ICU for hypoxemia with associated altered mental status.  Will sign off at this time-please call with questions or concerns.  Subjective:   Earlier complained to nurse about chest pain-localized this epigastric pain.  Transferred overnight to ICU for closer monitoring and management of hypoxemia.   Objective:   BP (!) 134/55   Pulse 64   Temp (!) 97.1 F (36.2 C) (Axillary)   Resp 14   Ht 5' (1.524 m)   Wt 44.9 kg   SpO2 100%   BMI 19.33 kg/m   Intake/Output Summary (Last 24 hours) at 03/26/2019 0719 Last data filed at 03/26/2019 0600 Gross per 24 hour  Intake 1434.98 ml  Output 400 ml  Net 1034.98 ml   Weight change: -0.8 kg  Physical  Exam: Patient not examined today due to COVID-19 precautions. Discussed with primary service/RN  Imaging: Dg Chest Port 1 View  Result Date: 03/25/2019 CLINICAL DATA:  68 year old female with respiratory distress EXAM: PORTABLE CHEST 1 VIEW COMPARISON:  03/24/2019, 02/15/2019 FINDINGS: Cardiomediastinal silhouette unchanged in size and contour. Calcifications of the aortic arch. Similar appearance of diffuse reticulonodular pattern opacity with interlobular septal thickening and symmetric airspace opacities with no gradient from superior to inferior. Unchanged right upper extremity PICC IMPRESSION: Similar appearance of mixed interstitial and airspace opacities, with rapid worsening in 24-48 hours. Differential includes edema, multifocal infection, drug reaction, acute interstitial pneumonitis. Unchanged right upper extremity PICC Electronically Signed   By: Corrie Mckusick D.O.   On: 03/25/2019 14:27   Dg Chest Port 1 View  Result Date: 03/24/2019 CLINICAL DATA:  Shortness of breath. Hypertension. Decreased oxygen saturation. EXAM: PORTABLE CHEST 1 VIEW COMPARISON:  03/24/2019 at 1:29 a.m. FINDINGS: 2007 hours. Right PICC line tip at mid right atrium. Patient rotated left. Normal heart size. Atherosclerosis in the transverse aorta. No pleural effusion or pneumothorax. Significantly worsened aeration, with relatively diffuse slightly perihilar predominant right greater than left interstitial and airspace disease. Remote right rib fractures. IMPRESSION: Markedly worsened aeration, with relatively diffuse interstitial and airspace disease. Given time course since the prior exam, favor aspiration or alveolar edema. Progression of infection could look similar. Electronically Signed   By: Abigail Miyamoto M.D.   On: 03/24/2019 19:48   [place] Ekg Site Rite  Result Date: 03/24/2019 If Site Rite image not attached, placement could not be confirmed due to current cardiac rhythm.   Labs: [employer]  Lab  03/24/19 1730 03/25/19 0558 03/25/19 1130 03/26/19 0342  NA 143 144  --  141  K 2.6* 2.9*  --  4.3  CL 113* 105  --  108  CO2 16* 26  --  24  GLUCOSE 86 112*  --  274*  BUN 28* 26*  --  23  CREATININE 1.98* 1.97*  --  1.72*  CALCIUM 6.0* 5.8*  --  6.3*  PHOS  --   --  3.4  --    CBC Recent Labs  Lab 03/25/19 0558 03/26/19 0342  WBC 8.5 6.2  NEUTROABS 6.9 5.4  HGB 7.5* 7.2*  HCT 23.3* 23.5*  MCV 91.7 97.5  PLT 101* 98*    Medications:    . albuterol  2 puff Inhalation QID  . calcium carbonate (dosed in mg elemental calcium)  500 mg of elemental calcium Per Tube TID AC & HS  . docusate sodium  100 mg Oral BID  . enoxaparin (LOVENOX) injection  30 mg Subcutaneous q morning - 10a  . hydroxychloroquine  200 mg Oral BID  . methylPREDNISolone (SOLU-MEDROL) injection  40 mg Intravenous Q8H  . phenytoin (DILANTIN) IV  65 mg Intravenous Q8H  . sodium bicarbonate  650 mg Oral TID  . umeclidinium-vilanterol  1 puff Inhalation Daily   Elmarie Shiley, MD 03/26/2019, 7:19 AM

## 2019-03-26 NOTE — Progress Notes (Signed)
RT NOTE: Patient continuously took HFNC off and stated she did not want it in her nose. RT placed patient on Venti mask 14L and 55% sating 93% and says she likes it much better. RT will continue to monitor.

## 2019-03-26 NOTE — Progress Notes (Signed)
Pt complaining of "chest pain". When pt points to where it hurts she touches her upper abdomin. MD Oletta Darter made aware. Will continue to assess.

## 2019-03-26 NOTE — Progress Notes (Signed)
Patient will take po medications. She spit out her 10 am medications. Patient remains confused at baseline as per son. Patient continues to cry out when reoriented patient clams down for a few minutes then back to crying . Denies pain or discomfort. Pulling at face mask frequently at bedside to readjust mask

## 2019-03-26 NOTE — Progress Notes (Signed)
CRITICAL VALUE ALERT  Critical Value:  Ca 6.3  Date & Time Notied:  03/26/2019 @ 0698  Provider Notified: Warren Lacy  Orders Received/Actions taken:

## 2019-03-26 NOTE — Progress Notes (Addendum)
PROGRESS NOTE    [name]  NMM:[id number] DOB: Jan 10, 1951 DOA: 03/24/2019 PCP: Marco Collie, MD   Brief Narrative:  68 y.o. 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  She is unable to provide history. Dr Lorin Mercy  On admission called and spoke with her son, Larene Beach.  Her son is her caregiver.  Sunday, she didn't feel well - he thought she had a UTI.  He called urology (Dr. Gerlene Burdock) yesterday and he diagnosed a bad UTI.  Last night, "she just didn't look right."  She was weak and SOB.  He gave her breathing treatments and neb treatments.  She didn't sleep.  She does not have any medication for pain - previously using oxycontin 5 mg.  She has been taking Bayer back and body aches.  She took 2 tabs at a time 2 times during the day, provided by a caregiver.  She does have it in her room and could have taken more.  She also takes Motrin.  No one else is sick in the home.  Her son and nephew both have asthma and neither of them has been ill.  She has not tested for COVID.  No fever but she was clammy last night.  She has COPD and has chronic cough but this was better yesterday.  No GI symptoms.  She is conversant at baseline but has dementia - she recognizes her son but does not know the year, president.  She is oriented to person and place at baseline.  She was intermittently confused yesterday.  She would want to be full code. Transfer from [facility]    Subjective: 4/16 A/O x1 (does not know where, when, why).  States I do not feel good then states I want to go home.     Assessment & Plan:   Principal Problem:   Acute  on chronic respiratory failure with hypoxia (HCC) Active Problems:   COPD (chronic obstructive pulmonary disease) (HCC)   Blind loop syndrome   Subacute delirium   Seizure disorder (HCC)   HTN (hypertension)   Acute renal failure superimposed on stage 3 chronic kidney disease (HCC)   Alzheimer's dementia ([place])   COPD with acute exacerbation (HCC)  NSAID overdose - Most likely unintentional given patient's mental status. - On admission treated with sodium bicarb  Acute hypoxemic respiratory failure -Tested negative for COVID -Very abnormal chest x-ray with multifocal infiltrates concerning for a viral pneumonia -Respiratory virus panel negative - Patient most likely just with COPD exacerbation and no true infection.  However given her comorbidities will treat with short course of antibiotics. - 4/16 Solu-Medrol 40 mg 3 times daily begin titration down. - DuoNeb QID - Flutter valve - Pulmonary toilet -Titrate O2 to maintain SPO2 89 to 92% -PCXR 4/17  Concern for COVID  - See respiratory failure   Chronic obstructive pulmonary disease -See respiratory failure-   Alzheimer's dementia/Hx CVA/hospital delirium? -Unknown baseline - Donepezil 10 mg nightly - Mirtazapine 45 mg nightly - DC Dilaudid  Seizure disorder -Keppra 500 mg twice daily - Phenytoin 65 mg 3 times daily   Acute on CKD stage III (baseline Cr 1.7) -Avoid nephrotoxic  medication - Trend creatinine Recent Labs  Lab 03/24/19 1730 03/25/19 0558 03/26/19 0342  CREATININE 1.98* 1.97* 1.72*  -Currently at baseline -Continue D5W-0.45% saline 37ml/hr, Discontinue K  Essential HTN -Currently controlled without medication.  Monitor closely   Abdominal pain - KUB pending - N.p.o.  Goals of care - 4/16 palliative care consult: Demented patient with multiple medical problems consider DNR status change        DVT prophylaxis: Lovenox Code Status: Full Family Communication: None Disposition Plan:  Daily   Consultants:  Nephrology   Procedures/Significant Events:  4/15 PCXR:-Similar appearance of mixed interstitial and airspace opacities, with rapid worsening in 24-48 hours. Differential includes edema, multifocal infection, drug reaction, acute interstitial pneumonitis.     I have personally reviewed and interpreted all radiology studies and my findings are as above.  VENTILATOR SETTINGS: None   Cultures 4/14 SARS coronavirus negative 4/14 COVUID-19 send out negative 4/16 respiratory virus panel negative   Antimicrobials: Anti-infectives (From admission, onward)   Start     Ordered Stop   03/26/19 1000  hydroxychloroquine (PLAQUENIL) tablet 200 mg  Status:  Discontinued     03/25/19 1140 03/26/19 1226   03/26/19 0800  vancomycin (VANCOCIN) 500 mg in sodium chloride 0.9 % 100 mL IVPB  Status:  Discontinued     03/25/19 0859 03/26/19 0912   03/25/19 1200  azithromycin (ZITHROMAX) 500 mg in sodium chloride 0.9 % 250 mL IVPB  Status:  Discontinued     03/25/19 1118 03/25/19 1303   03/25/19 1145  hydroxychloroquine (PLAQUENIL) tablet 400 mg     03/25/19 1140 03/25/19 2247   03/25/19 1130  hydroxychloroquine (PLAQUENIL) tablet 200 mg  Status:  Discontinued     03/25/19 1118 03/25/19 1140   03/25/19 0000  piperacillin-tazobactam (ZOSYN) IVPB 3.375 g     03/24/19 2014     03/24/19 2013  vancomycin variable dose per unstable renal function (pharmacist dosing)  Status:  Discontinued     03/24/19 2013 03/25/19 0859   03/24/19 1800  piperacillin-tazobactam (ZOSYN) IVPB 3.375 g     03/24/19 1724 03/24/19 2020   03/24/19 1800  vancomycin (VANCOCIN) 1,000 mg in sodium chloride 0.9 % 250 mL IVPB     03/24/19 1724 03/24/19 2114       Devices    LINES / TUBES:      Continuous Infusions:  dextrose 5 % and 0.45% NaCl     famotidine (PEPCID) IV 20 mg (03/26/19 1232)   levETIRAcetam 500 mg (03/26/19 1015)   piperacillin-tazobactam (ZOSYN)  IV 3.375 g (03/26/19  1147)     Objective: Vitals:   03/26/19 0928 03/26/19 1000 03/26/19 1100 03/26/19 1200  BP:  (!) 126/53 (!) 138/56 (!) 144/59  Pulse:  63 65 70  Resp:  18 17 18   Temp:      TempSrc:      SpO2: 93% (!) 84% 99% 100%  Weight:      Height:        Intake/Output Summary (Last 24 hours) at 03/26/2019 1510 Last data filed at 03/26/2019 0900 Gross per 24 hour  Intake 1608.92 ml  Output 400 ml  Net 1208.92 ml   Filed Weights   03/25/19 0022 03/26/19 0100  Weight: 45.7 kg 44.9 kg    Examination:  General: A/O x1 (does not know when, why, where) positive acute respiratory distress, cachectic Eyes: negative scleral hemorrhage, negative anisocoria, negative icterus ENT: Negative Runny nose, negative gingival bleeding, Neck:  Negative scars, masses, torticollis, lymphadenopathy, JVD  Lungs: Diffuse poor air movement, without wheezes, bibasilar crackles Cardiovascular: Tachycardic, without murmur gallop or rub normal S1 and S2 Abdomen: Positive abdominal pain, nondistended, positive soft, bowel sounds, no rebound, no ascites, no appreciable mass Extremities: No significant cyanosis, clubbing, or edema bilateral lower extremities Skin: Negative rashes, lesions, ulcers Psychiatric: Unable to assess secondary to patient's dementia (baseline?)   Central nervous system:  Cranial nerves II through XII intact, moves all extremities unable to assess further as patient does not follow commands   .     Data Reviewed: Care during the described time interval was provided by me .  I have reviewed this patient's available data, including medical history, events of note, physical examination, and all test results as part of my evaluation.   CBC: Recent Labs  Lab 03/25/19 0558 03/26/19 0342 03/26/19 0844  WBC 8.5 6.2  --   NEUTROABS 6.9 5.4  --   HGB 7.5* 7.2* 7.5*  HCT 23.3* 23.5* 22.0*  MCV 91.7 97.5  --   PLT 101* 98*  --    Basic Metabolic Panel: Recent Labs  Lab 03/24/19 1730  03/25/19 0558 03/25/19 0811 03/25/19 1130 03/26/19 0342 03/26/19 0844  NA 143 144  --   --  141 144  K 2.6* 2.9*  --   --  4.3 4.0  CL 113* 105  --   --  108  --   CO2 16* 26  --   --  24  --   GLUCOSE 86 112*  --   --  274*  --   BUN 28* 26*  --   --  23  --   CREATININE 1.98* 1.97*  --   --  1.72*  --   CALCIUM 6.0* 5.8*  --   --  6.3*  --   MG  --   --  1.2*  --  1.4*  --   PHOS  --   --   --  3.4  --   --    GFR: Estimated Creatinine Clearance: 22.5 mL/min (A) (by C-G formula based on SCr of 1.72 mg/dL (H)). Liver Function Tests: Recent Labs  Lab 03/24/19 1730 03/25/19 0558 03/26/19 0342  AST 32 35 29  ALT 6 8 7   ALKPHOS 69 62 55  BILITOT 0.5 0.4 0.4  PROT 4.2* 4.2* 4.3*  ALBUMIN 1.9* 1.7* 1.8*   No results for input(s): LIPASE, AMYLASE in the last 168 hours. No results for input(s): AMMONIA in the last 168 hours. Coagulation Profile: No results for input(s): INR, PROTIME in the last 168 hours. Cardiac Enzymes: Recent Labs  Lab 03/24/19 1730 03/26/19 0342 03/26/19 1011  TROPONINI <0.03 <0.03 <0.03   BNP (last 3 results) No results for input(s): PROBNP in the last 8760 hours. HbA1C: No results for input(s): HGBA1C in the last 72 hours. CBG: No results for input(s): GLUCAP in the last 168 hours. Lipid Profile: No results for input(s): CHOL, HDL, LDLCALC, TRIG, CHOLHDL, LDLDIRECT in the last 72 hours. Thyroid Function Tests: No results for input(s): TSH, T4TOTAL, FREET4, T3FREE, THYROIDAB in the last 72 hours. Anemia Panel: Recent Labs    03/24/19 1730  FERRITIN 151   Urine analysis:    Component Value Date/Time   COLORURINE YELLOW 03/24/2019 1400   APPEARANCEUR CLOUDY (A) 03/24/2019 1400   LABSPEC 1.019 03/24/2019 1400   PHURINE 5.0 03/24/2019 1400   GLUCOSEU 150 (A) 03/24/2019 1400   HGBUR MODERATE (A) 03/24/2019 1400   BILIRUBINUR NEGATIVE 03/24/2019 1400  KETONESUR NEGATIVE 03/24/2019 1400   PROTEINUR 30 (A) 03/24/2019 1400   UROBILINOGEN  0.2 01/24/2014 0500   NITRITE NEGATIVE 03/24/2019 1400   LEUKOCYTESUR LARGE (A) 03/24/2019 1400   Sepsis Labs: @LABRCNTIP (procalcitonin:4,lacticidven:4)  ) Recent Results (from the past 240 hour(s))  SARS Coronavirus 2 [facility] order, Performed in [place] hospital lab)     Status: None   Collection Time: 03/24/19  2:09 PM  Result Value Ref Range Status   SARS Coronavirus 2 NEGATIVE NEGATIVE Final    Comment: (NOTE) If result is NEGATIVE SARS-CoV-2 target nucleic acids are NOT DETECTED. The SARS-CoV-2 RNA is generally detectable in upper and lower  respiratory specimens during the acute phase of infection. The lowest  concentration of SARS-CoV-2 viral copies this assay can detect is 250  copies / mL. A negative result does not preclude SARS-CoV-2 infection  and should not be used as the sole basis for treatment or other  patient management decisions.  A negative result may occur with  improper specimen collection / handling, submission of specimen other  than nasopharyngeal swab, presence of viral mutation(s) within the  areas targeted by this assay, and inadequate number of viral copies  (<250 copies / mL). A negative result must be combined with clinical  observations, patient history, and epidemiological information. If result is POSITIVE SARS-CoV-2 target nucleic acids are DETECTED. The SARS-CoV-2 RNA is generally detectable in upper and lower  respiratory specimens dur ing the acute phase of infection.  Positive  results are indicative of active infection with SARS-CoV-2.  Clinical  correlation with patient history and other diagnostic information is  necessary to determine patient infection status.  Positive results do  not rule out bacterial infection or co-infection with other viruses. If result is PRESUMPTIVE POSTIVE SARS-CoV-2 nucleic acids MAY BE PRESENT.   A presumptive positive result was obtained on the submitted specimen  and confirmed on repeat testing.   While 2019 novel coronavirus  (SARS-CoV-2) nucleic acids may be present in the submitted sample  additional confirmatory testing may be necessary for epidemiological  and / or clinical management purposes  to differentiate between  SARS-CoV-2 and other Sarbecovirus currently known to infect humans.  If clinically indicated additional testing with an alternate test  methodology [phone]) is advised. The SARS-CoV-2 RNA is generally  detectable in upper and lower respiratory sp ecimens during the acute  phase of infection. The expected result is Negative. Fact Sheet for Patients:  StrictlyIdeas.no Fact Sheet for Healthcare Providers: BankingDealers.co.za This test is not yet approved or cleared by the [place] FDA and has been authorized for detection and/or diagnosis of SARS-CoV-2 by FDA under an Emergency Use Authorization (EUA).  This EUA will remain in effect (meaning this test can be used) for the duration of the COVID-19 declaration under Section 564(b)(1) of the Act, 21 U.S.C. section 360bbb-3(b)(1), unless the authorization is terminated or revoked sooner. Performed at [facility], [place] [address], [place], [place] [address]   Novel Coronavirus, NAA (hospital order; send-out to ref lab)     Status: None   Collection Time: 03/24/19  2:09 PM  Result Value Ref Range Status   SARS-CoV-2, NAA NOT DETECTED NOT DETECTED Final    Comment: (NOTE) This test was developed and its performance characteristics determined by [employer]. This test has not been FDA cleared or approved. This test has been authorized by FDA under an Emergency Use Authorization (EUA). This test has been validated in accordance with the FDA's Guidance Document (  Policy for Diagnostics Testing in Laboratories Certified to Perform High Complexity Testing under CLIA prior to Emergency Use Authorization for Coronavirus [id number] during the [facility] Emergency) issued on February 29th, 2020. FDA independent review of this validation is pending. This test is only authorized for the duration of time the declaration that circumstances exist justifying the authorization of the emergency use of in vitro diagnostic tests for detection of SARS-CoV- 2 virus and/or diagnosis of COVID-19 infection under section 564(b)(1) of the Act, 21 U.S.C. 981XBJ-4(N)(8), unless the authorization is terminated or revoked sooner. Performed At: [facility] [address] [place], [place] [id number] Rush Farmer MD MV:[id number]    Concrete  Final    Comment: Performed at [facility], [place] [address], [place], [place] [address]  Culture, blood (Routine X 2) w Reflex to ID Panel     Status: None (Preliminary result)   Collection Time: 03/24/19  3:35 PM  Result Value Ref Range Status   Specimen Description BLOOD RIGHT PICC LINE  Final   Special Requests   Final    BOTTLES DRAWN AEROBIC ONLY Blood Culture adequate volume   Culture   Final    NO GROWTH 2 DAYS Performed at [facility], [place] [address], [place], [place] [address]    Report Status PENDING  Incomplete  Culture, blood (Routine X 2) w Reflex to ID Panel     Status: None (Preliminary result)   Collection Time: 03/24/19  4:25 PM  Result Value Ref Range Status   Specimen Description BLOOD RIGHT HAND  Final   Special Requests   Final    BOTTLES DRAWN AEROBIC ONLY Blood Culture results may not be optimal due to an inadequate volume of blood received in culture bottles   Culture   Final    NO GROWTH 2 DAYS Performed at [facility], [place] [address], [place], [place] [address]    Report Status PENDING  Incomplete  MRSA PCR Screening     Status: None   Collection Time: 03/25/19  6:22 PM  Result Value Ref Range Status   MRSA by PCR NEGATIVE NEGATIVE Final    Comment:        The GeneXpert MRSA Assay (FDA approved for NASAL  specimens only), is one component of a comprehensive MRSA colonization surveillance program. It is not intended to diagnose MRSA infection nor to guide or monitor treatment for MRSA infections. Performed at [facility], [place] [address] [place], [place] [address]   Respiratory Panel by PCR     Status: None   Collection Time: 03/26/19  8:36 AM  Result Value Ref Range Status   Adenovirus NOT DETECTED NOT DETECTED Final   Coronavirus 229E NOT DETECTED NOT DETECTED Final    Comment: (NOTE) The Coronavirus on the Respiratory Panel, DOES NOT test for the novel  Coronavirus (2019 nCoV)    Coronavirus HKU1 NOT DETECTED NOT DETECTED Final   Coronavirus NL63 NOT DETECTED NOT DETECTED Final   Coronavirus OC43 NOT DETECTED NOT DETECTED Final   Metapneumovirus NOT DETECTED NOT DETECTED Final   Rhinovirus / Enterovirus NOT DETECTED NOT DETECTED Final   Influenza A NOT DETECTED NOT DETECTED Final   Influenza B NOT DETECTED NOT DETECTED Final   Parainfluenza Virus 1 NOT DETECTED NOT DETECTED Final   Parainfluenza Virus 2 NOT DETECTED NOT DETECTED Final   Parainfluenza Virus 3 NOT DETECTED NOT DETECTED Final   Parainfluenza Virus 4 NOT DETECTED NOT  DETECTED Final   Respiratory Syncytial Virus NOT DETECTED NOT DETECTED Final   Bordetella pertussis NOT DETECTED NOT DETECTED Final   Chlamydophila pneumoniae NOT DETECTED NOT DETECTED Final   Mycoplasma pneumoniae NOT DETECTED NOT DETECTED Final    Comment: Performed at [facility], [place] [address], [place], [place] [address]         Radiology Studies: Dg Chest Port 1 View  Result Date: 03/26/2019 CLINICAL DATA:  Shortness of breath. EXAM: PORTABLE CHEST 1 VIEW COMPARISON:  03/25/2019 FINDINGS: Right-sided PICC line unchanged with tip just above the cavoatrial junction. Lungs are adequately inflated with no change in moderate bilateral diffuse mixed interstitial airspace opacification. No significant effusion. Cardiomediastinal  silhouette and remainder of the exam is unchanged. IMPRESSION: Moderate stable bilateral mixed interstitial/airspace process. Right-sided PICC line unchanged. Electronically Signed   By: Marin Olp M.D.   On: 03/26/2019 08:21   Dg Chest Port 1 View  Result Date: 03/25/2019 CLINICAL DATA:  68 year old female with respiratory distress EXAM: PORTABLE CHEST 1 VIEW COMPARISON:  03/24/2019, 02/15/2019 FINDINGS: Cardiomediastinal silhouette unchanged in size and contour. Calcifications of the aortic arch. Similar appearance of diffuse reticulonodular pattern opacity with interlobular septal thickening and symmetric airspace opacities with no gradient from superior to inferior. Unchanged right upper extremity PICC IMPRESSION: Similar appearance of mixed interstitial and airspace opacities, with rapid worsening in 24-48 hours. Differential includes edema, multifocal infection, drug reaction, acute interstitial pneumonitis. Unchanged right upper extremity PICC Electronically Signed   By: Corrie Mckusick D.O.   On: 03/25/2019 14:27   Dg Chest Port 1 View  Result Date: 03/24/2019 CLINICAL DATA:  Shortness of breath. Hypertension. Decreased oxygen saturation. EXAM: PORTABLE CHEST 1 VIEW COMPARISON:  03/24/2019 at 1:29 a.m. FINDINGS: 2007 hours. Right PICC line tip at mid right atrium. Patient rotated left. Normal heart size. Atherosclerosis in the transverse aorta. No pleural effusion or pneumothorax. Significantly worsened aeration, with relatively diffuse slightly perihilar predominant right greater than left interstitial and airspace disease. Remote right rib fractures. IMPRESSION: Markedly worsened aeration, with relatively diffuse interstitial and airspace disease. Given time course since the prior exam, favor aspiration or alveolar edema. Progression of infection could look similar. Electronically Signed   By: Abigail Miyamoto M.D.   On: 03/24/2019 19:48        Scheduled Meds:  calcium carbonate (dosed in mg  elemental calcium)  500 mg of elemental calcium Per Tube TID AC & HS   docusate sodium  100 mg Oral BID   donepezil  10 mg Oral QHS   enoxaparin (LOVENOX) injection  30 mg Subcutaneous q morning - 10a   insulin aspart  0-5 Units Subcutaneous QHS   insulin aspart  0-9 Units Subcutaneous TID WC   ipratropium-albuterol  3 mL Nebulization Q6H   magnesium oxide  400 mg Oral BID   methylPREDNISolone (SOLU-MEDROL) injection  40 mg Intravenous Q8H   mirtazapine  45 mg Oral QHS   phenytoin (DILANTIN) IV  65 mg Intravenous Q8H   sodium bicarbonate  650 mg Oral TID   Continuous Infusions:  dextrose 5 % and 0.45% NaCl     famotidine (PEPCID) IV 20 mg (03/26/19 1232)   levETIRAcetam 500 mg (03/26/19 1015)   piperacillin-tazobactam (ZOSYN)  IV 3.375 g (03/26/19 1147)     LOS: 2 days   The patient is critically ill with multiple organ systems failure and requires high complexity decision making for assessment and support, frequent evaluation and titration of therapies, application of advanced monitoring  technologies and extensive interpretation of multiple databases. Critical Care Time devoted to patient care services described in this note  Time spent: 40 minutes     Kc Sedlak, Geraldo Docker, MD [facility] Pager [phone]  If 7PM-7AM, please contact night-coverage [email or web address] Password [email or web address] 03/26/2019, 3:10 PM

## 2019-03-26 NOTE — Progress Notes (Signed)
RT NOTE: RT obtained ABG on patient and no critical values, but ABG was reported to MD. RT placed patient on HFNC salter 15L per PO2 and MD. No other orders from MD at this time. RT will continue to monitor.

## 2019-03-26 NOTE — Progress Notes (Signed)
[facility] Progress Note Patient Name: [name] DOB: 1951/09/03 MRN: [id number]   Date of Service  03/26/2019  HPI/Events of Note  Patient c/o upper abdominal pain. Troponin < 0.03. EKG - Sinus rhythm with Premature supraventricular complexes. Septal infarct , age undetermined Cannot rule out Inferior infarct , age undetermined.  eICU Interventions  Will order: 1. Continue to cycle Troponin. 2. Pepcid 20 mg IV now and Q 12 hours.      Intervention Category Major Interventions: Other:  Lysle Dingwall 03/26/2019, 5:14 AM

## 2019-03-26 NOTE — Progress Notes (Signed)
[facility] Progress Note Patient Name: [name] DOB: 1951/01/09 MRN: [id number]   Date of Service  03/26/2019  HPI/Events of Note  Patient c/o back pain - I see no note from PCCM today. It appears that PCCM has signed off.   eICU Interventions  Will order: 1. Fentanyl 25 mcg IV X 1 now.  2. Further management per [facility] group.     Intervention Category Major Interventions: Other:  Lysle Dingwall 03/26/2019, 11:01 PM

## 2019-03-27 ENCOUNTER — Encounter (HOSPITAL_COMMUNITY): Payer: Self-pay | Admitting: Internal Medicine

## 2019-03-27 ENCOUNTER — Inpatient Hospital Stay (HOSPITAL_COMMUNITY): Payer: Medicare Other

## 2019-03-27 DIAGNOSIS — R410 Disorientation, unspecified: Secondary | ICD-10-CM

## 2019-03-27 DIAGNOSIS — Z515 Encounter for palliative care: Secondary | ICD-10-CM

## 2019-03-27 DIAGNOSIS — T39391A Poisoning by other nonsteroidal anti-inflammatory drugs [NSAID], accidental (unintentional), initial encounter: Secondary | ICD-10-CM

## 2019-03-27 DIAGNOSIS — F0281 Dementia in other diseases classified elsewhere with behavioral disturbance: Secondary | ICD-10-CM

## 2019-03-27 DIAGNOSIS — Z7189 Other specified counseling: Secondary | ICD-10-CM

## 2019-03-27 DIAGNOSIS — J431 Panlobular emphysema: Secondary | ICD-10-CM

## 2019-03-27 DIAGNOSIS — D649 Anemia, unspecified: Secondary | ICD-10-CM

## 2019-03-27 DIAGNOSIS — R1013 Epigastric pain: Secondary | ICD-10-CM

## 2019-03-27 HISTORY — DX: Disorientation, unspecified: R41.0

## 2019-03-27 HISTORY — DX: Anemia, unspecified: D64.9

## 2019-03-27 LAB — IRON AND TIBC: Iron: 33 ug/dL (ref 28–170)

## 2019-03-27 LAB — CBC WITH DIFFERENTIAL/PLATELET
Abs Immature Granulocytes: 0.11 10*3/uL — ABNORMAL HIGH (ref 0.00–0.07)
Basophils Absolute: 0 10*3/uL (ref 0.0–0.1)
Basophils Relative: 0 %
Eosinophils Absolute: 0 10*3/uL (ref 0.0–0.5)
Eosinophils Relative: 0 %
HCT: 23 % — ABNORMAL LOW (ref 36.0–46.0)
Hemoglobin: 6.9 g/dL — CL (ref 12.0–15.0)
Immature Granulocytes: 2 %
Lymphocytes Relative: 8 %
Lymphs Abs: 0.6 10*3/uL — ABNORMAL LOW (ref 0.7–4.0)
MCH: 30 pg (ref 26.0–34.0)
MCHC: 30 g/dL (ref 30.0–36.0)
MCV: 100 fL (ref 80.0–100.0)
Monocytes Absolute: 0.3 10*3/uL (ref 0.1–1.0)
Monocytes Relative: 5 %
Neutro Abs: 6.1 10*3/uL (ref 1.7–7.7)
Neutrophils Relative %: 85 %
Platelets: 101 10*3/uL — ABNORMAL LOW (ref 150–400)
RBC: 2.3 MIL/uL — ABNORMAL LOW (ref 3.87–5.11)
RDW: 14.3 % (ref 11.5–15.5)
WBC: 7.1 10*3/uL (ref 4.0–10.5)
nRBC: 0 % (ref 0.0–0.2)

## 2019-03-27 LAB — CBC
HCT: 28.1 % — ABNORMAL LOW (ref 36.0–46.0)
Hemoglobin: 8.7 g/dL — ABNORMAL LOW (ref 12.0–15.0)
MCH: 30.1 pg (ref 26.0–34.0)
MCHC: 31 g/dL (ref 30.0–36.0)
MCV: 97.2 fL (ref 80.0–100.0)
Platelets: 102 10*3/uL — ABNORMAL LOW (ref 150–400)
RBC: 2.89 MIL/uL — ABNORMAL LOW (ref 3.87–5.11)
RDW: 15.4 % (ref 11.5–15.5)
WBC: 9.6 10*3/uL (ref 4.0–10.5)
nRBC: 0 % (ref 0.0–0.2)

## 2019-03-27 LAB — COMPREHENSIVE METABOLIC PANEL
ALT: 9 U/L (ref 0–44)
AST: 19 U/L (ref 15–41)
Albumin: 1.8 g/dL — ABNORMAL LOW (ref 3.5–5.0)
Alkaline Phosphatase: 50 U/L (ref 38–126)
Anion gap: 8 (ref 5–15)
BUN: 17 mg/dL (ref 8–23)
CO2: 26 mmol/L (ref 22–32)
Calcium: 7.1 mg/dL — ABNORMAL LOW (ref 8.9–10.3)
Chloride: 109 mmol/L (ref 98–111)
Creatinine, Ser: 1.5 mg/dL — ABNORMAL HIGH (ref 0.44–1.00)
GFR calc Af Amer: 41 mL/min — ABNORMAL LOW (ref >60)
GFR calc non Af Amer: 36 mL/min — ABNORMAL LOW (ref >60)
Glucose, Bld: 129 mg/dL — ABNORMAL HIGH (ref 70–99)
Potassium: 3.7 mmol/L (ref 3.5–5.1)
Sodium: 143 mmol/L (ref 135–145)
Total Bilirubin: 0.3 mg/dL (ref 0.3–1.2)
Total Protein: 4.5 g/dL — ABNORMAL LOW (ref 6.5–8.1)

## 2019-03-27 LAB — RETICULOCYTES
Immature Retic Fract: 11 % (ref 2.3–15.9)
RBC.: 2.46 MIL/uL — ABNORMAL LOW (ref 3.87–5.11)
Retic Count, Absolute: 46.2 10*3/uL (ref 19.0–186.0)
Retic Ct Pct: 1.9 % (ref 0.4–3.1)

## 2019-03-27 LAB — OCCULT BLOOD X 1 CARD TO LAB, STOOL: Fecal Occult Bld: POSITIVE — AB

## 2019-03-27 LAB — GLUCOSE, CAPILLARY
Glucose-Capillary: 113 mg/dL — ABNORMAL HIGH (ref 70–99)
Glucose-Capillary: 89 mg/dL (ref 70–99)
Glucose-Capillary: 86 mg/dL (ref 70–99)
Glucose-Capillary: 114 mg/dL — ABNORMAL HIGH (ref 70–99)

## 2019-03-27 LAB — PHOSPHORUS: Phosphorus: 3.4 mg/dL (ref 2.5–4.6)

## 2019-03-27 LAB — VITAMIN B12: Vitamin B-12: 215 pg/mL (ref 180–914)

## 2019-03-27 LAB — STREP PNEUMONIAE URINARY ANTIGEN: Strep Pneumo Urinary Antigen: NEGATIVE

## 2019-03-27 LAB — FOLATE: Folate: 10.2 ng/mL (ref >5.9)

## 2019-03-27 LAB — LACTATE DEHYDROGENASE: LDH: 388 U/L — ABNORMAL HIGH (ref 98–192)

## 2019-03-27 LAB — MAGNESIUM: Magnesium: 2.2 mg/dL (ref 1.7–2.4)

## 2019-03-27 LAB — FERRITIN: Ferritin: 80 ng/mL (ref 11–307)

## 2019-03-27 LAB — PREPARE RBC (CROSSMATCH)

## 2019-03-27 MED ORDER — SODIUM CHLORIDE 0.9% IV SOLUTION
Freq: Once | INTRAVENOUS | Status: AC
  Administered 2019-03-27: 07:00:00 via INTRAVENOUS

## 2019-03-27 MED ORDER — DOXAZOSIN MESYLATE 2 MG PO TABS
2.0000 mg | ORAL_TABLET | Freq: Every day | ORAL | Status: DC
  Administered 2019-03-27 – 2019-03-30 (×3): 2 mg via ORAL
  Filled 2019-03-27 (×4): qty 1

## 2019-03-27 MED ORDER — FENTANYL CITRATE (PF) 100 MCG/2ML IJ SOLN
25.0000 ug | Freq: Once | INTRAMUSCULAR | Status: AC
  Administered 2019-03-27: 25 ug via INTRAVENOUS
  Filled 2019-03-27: qty 2

## 2019-03-27 MED ORDER — SODIUM CHLORIDE 0.9% IV SOLUTION: Freq: Once | INTRAVENOUS | Status: AC

## 2019-03-27 MED ORDER — FUROSEMIDE 10 MG/ML IJ SOLN: 40.0000 mg | Freq: Every day | INTRAMUSCULAR | Status: DC

## 2019-03-27 MED ORDER — FUROSEMIDE 10 MG/ML IJ SOLN
40.0000 mg | Freq: Every day | INTRAMUSCULAR | Status: DC
  Administered 2019-03-27: 40 mg via INTRAVENOUS
  Filled 2019-03-27: qty 4

## 2019-03-27 MED ORDER — METHYLPREDNISOLONE SODIUM SUCC 40 MG IJ SOLR
40.0000 mg | Freq: Two times a day (BID) | INTRAMUSCULAR | Status: DC
  Administered 2019-03-27 – 2019-03-29 (×4): 40 mg via INTRAVENOUS
  Filled 2019-03-27 (×6): qty 1

## 2019-03-27 NOTE — TOC Initial Note (Addendum)
Transition of Care [facility]) - Initial/Assessment Note    Patient Details  Name: [name] MRN: [id number] Date of Birth: 10-21-1951  Transition of Care [facility]) CM/SW Contact:    Maryclare Labrador, RN Phone Number: 03/27/2019, 10:20 AM  Clinical Narrative:     PTA from home with son, son has 24 hour caregiver for pt to assist as needed.  Per son; pt independent with all ADLs with the exception of preparing meals (son prepares all meals), pt walks with a walker in the home and is active with [facility] requested resumption orders from attending. CM made referral to [facility] program - program will follow up directly with pts son beginning of next week.            Expected Discharge Plan: [place] Barriers to Discharge: Continued Medical Work up(still requiring non rebreather)   Patient Goals and CMS Choice        Expected Discharge Plan and Services Expected Discharge Plan: [place]   Discharge Planning Services: CM Consult   Living arrangements for the past 2 months: Single Family Home(stays with son)                          Prior Living Arrangements/Services Living arrangements for the past 2 months: Single Family Home(stays with son) Lives with:: Adult Children Patient language and need for interpreter reviewed:: Yes Do you feel safe going back to the place where you live?: Yes(per son, pt remains on nonrebreather)      Need for Family Participation in Patient Care: Yes (Comment) Care giver support system in place?: Yes (comment) Current home services: Home RN(PTA active with [facility]) Criminal Activity/Legal Involvement Pertinent to Current Situation/Hospitalization: No - Comment as needed  Activities of Daily Living Home Assistive Devices/Equipment: Environmental consultant (specify type) ADL Screening (condition at time of admission) Patient's cognitive ability adequate to safely complete  daily activities?: No Is the patient deaf or have difficulty hearing?: No Does the patient have difficulty seeing, even when wearing glasses/contacts?: No Does the patient have difficulty concentrating, remembering, or making decisions?: Yes Patient able to express need for assistance with ADLs?: No Does the patient have difficulty dressing or bathing?: No Independently performs ADLs?: No Communication: Independent Dressing (OT): Dependent Is this a change from baseline?: Change from baseline, expected to last <3days Grooming: Dependent Is this a change from baseline?: Change from baseline, expected to last <3 days Feeding: Dependent Is this a change from baseline?: Change from baseline, expected to last <3 days Bathing: Dependent Is this a change from baseline?: Change from baseline, expected to last <3 days Toileting: Needs assistance Is this a change from baseline?: Pre-admission baseline In/Out Bed: Needs assistance Is this a change from baseline?: Pre-admission baseline Walks in Home: Independent with device (comment) Does the patient have difficulty walking or climbing stairs?: Yes Weakness of Legs: Both Weakness of Arms/Hands: Both  Permission Sought/Granted Permission sought to share information with : Case Manager Permission granted to share information with : Yes, Verbal Permission Granted     Permission granted to share info w [place]: [facility] to resume HHRN at discharge        Emotional Assessment         Alcohol / Substance Use: Not Applicable Psych Involvement: No (comment)  Admission diagnosis:  COVID-19 [U07.1, J98.8] Pneumonia [J18.9] Patient Active Problem List  Diagnosis Date Noted  . Acute renal failure superimposed on stage 3 chronic kidney disease ([place]) 03/26/2019  . Alzheimer's dementia ([place]) 03/26/2019  . COPD with acute exacerbation ([place]) 03/26/2019  . Acute on chronic respiratory failure with hypoxia ([place]) 03/24/2019  . Acute  cholecystitis due to biliary calculus 10/23/2018  . Biliary colic 09/32/6712  . PAD (peripheral artery disease) ([place]) 06/06/2018  . Seizure disorder ([place]) 06/06/2018  . Chest pain 06/06/2018  . Diarrhea 06/06/2018  . HTN (hypertension) 06/06/2018  . HLD (hyperlipidemia) 06/06/2018  . Ventilator dependent ([place])   . Lactic acidosis   . Acute renal failure ([place])   . Recurrent sepsis due to urinary tract infection ([place])   . Iliac artery stenosis, left ([place]) 05/14/2014  . Swollen feet 02/01/2014  . Pain in limb-Left Great toe, 4th and  5th digit 02/01/2014  . Thromboembolism of lower extremity artery ([place]) 02/01/2014  . UTI (urinary tract infection) due to Enterococcus 01/20/2014  . Acute blood loss anemia 01/20/2014  . Protein-calorie malnutrition, severe ([place]) 01/18/2014  . Critical lower limb ischemia 01/17/2014  . Subacute delirium 10/14/2013  . DYSPHAGIA UNSPECIFIED 10/28/2008  . HYPOVOLEMIA 10/25/2008  . 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 [place] DISEASE 10/25/2008  . COPD (chronic obstructive pulmonary disease) ([place]) 10/25/2008  . ESOPHAGEAL STRICTURE 10/25/2008  . GERD 10/25/2008  . HIATAL HERNIA 10/25/2008  . SMALL BOWEL OBSTRUCTION 10/25/2008  . IBS 10/25/2008  . OTHER RETROPERITONEAL ABSCESS 10/25/2008  . Blind loop syndrome 10/25/2008  . OTHER AND UNSPECIFIED POSTSURGICAL NONABSORPTION 10/25/2008  . PYELONEPHRITIS 10/25/2008  . RENAL CALCULUS 10/25/2008  . INTERSTITIAL CYSTITIS 10/25/2008  . ENDOMETRIOSIS 10/25/2008  . PSORIASIS 10/25/2008  . HEADACHE, CHRONIC 10/25/2008  . COLONIC POLYPS, HX OF 10/25/2008   PCP:  Marco Collie, MD Pharmacy:   [facility], [place] [address] Phone: [phone] Fax: [phone]  [id number] [place], [address] [place] [address] Phone: [phone] Fax: [phone]     Social Determinants of Health  (SDOH) Interventions    Readmission Risk Interventions Readmission Risk Prevention Plan 03/27/2019  Transportation Screening Complete  Home Care Screening Complete  Some recent data might be hidden

## 2019-03-27 NOTE — Evaluation (Signed)
Clinical/Bedside Swallow Evaluation Patient Details  Name: [name] MRN: [id number] Date of Birth: 1951-05-25  Today's Date: 03/27/2019 Time: SLP Start Time (ACUTE ONLY): 5003 SLP Stop Time (ACUTE ONLY): 1410 SLP Time Calculation (min) (ACUTE ONLY): 17 min  Past Medical History:  Past Medical History:  Diagnosis Date  . Acute renal failure superimposed on stage 3 chronic kidney disease ([place]) 03/26/2019  . Alzheimer's dementia ([place])    takes Aricept nightly  . Alzheimer's dementia ([place]) 03/26/2019  . Anemia   . Anemia, unspecified 03/27/2019  . COPD (chronic obstructive pulmonary disease) ([place])   . COPD with acute exacerbation ([place]) 03/26/2019  . Delirium 03/27/2019  . History of blood transfusion    received 2units on Feb 17,2015  . History of MRSA infection    several yrs ago  . HLD (hyperlipidemia) 06/06/2018  . HTN (hypertension) 06/06/2018  . Hyperlipidemia    takes Atorvastatin daily  . Hypertension    takes Amlodipine and Carvedilol daily  . Insomnia    takes Ambien nightly  . Memory loss   . Renal disorder    kidney stones  . Seizures ([place])   . Stroke ([place])   . Subacute delirium 10/14/2013   takes Seroquel and Dilantin nightly  . Toxic encephalopathy   . UTI (lower urinary tract infection)    takes Macrodantin daily   Past Surgical History:  Past Surgical History:  Procedure Laterality Date  . ABDOMINAL ANGIOGRAM N/A 01/20/2014   Procedure: ABDOMINAL ANGIOGRAM;  Surgeon: Elam Dutch, MD;  Location: [facility] CATH LAB;  Service: Cardiovascular;  Laterality: N/A;  . ABDOMINAL HYSTERECTOMY    . AMPUTATION Left 02/08/2014   Procedure: AMPUTATION Left 4th & 5th toes;  Surgeon: Conrad Lavon, MD;  Location: [place];  Service: Vascular;  Laterality: Left;  . BLADDER SURGERY    . CHOLECYSTECTOMY  1995   Gall Bladder  . ERCP N/A 10/24/2018   Procedure: ENDOSCOPIC RETROGRADE CHOLANGIOPANCREATOGRAPHY (ERCP);  Surgeon: Clarene Essex, MD;  Location: [facility];  Service: Endoscopy;   Laterality: N/A;  . INSERTION OF ILIAC STENT Left 01/26/2014   Procedure: INSERTION OF ILIAC STENT- LEFT COMMON ILIAC ARTERY STENT ;  Surgeon: Conrad Pinion Pines, MD;  Location: [facility];  Service: Vascular;  Laterality: Left;  . KNEE SURGERY    . PANCREATIC STENT PLACEMENT  10/24/2018   Procedure: PANCREATIC STENT PLACEMENT;  Surgeon: Clarene Essex, MD;  Location: [facility];  Service: Endoscopy;;  . REMOVAL OF STONES  10/24/2018   Procedure: REMOVAL OF STONES;  Surgeon: Clarene Essex, MD;  Location: [facility] [place];  Service: Endoscopy;;  balloon sweep  . SPHINCTEROTOMY  10/24/2018   Procedure: SPHINCTEROTOMY;  Surgeon: Clarene Essex, MD;  Location[facility] ENDOSCOPY;  Service: Endoscopy;;   HPI:      Assessment / Plan / Recommendation Clinical Impression  Pt demonstrates normal swallowing function despte confusion. She is missing dentition and is able to masticate soft solids. Will order a mech soft diet thin liquids. No SLP f/u needed will sign off.  SLP Visit Diagnosis: Dysphagia, oropharyngeal phase (R13.12)    Aspiration Risk  Mild aspiration risk    Diet Recommendation Dysphagia 3 (Mech soft);Thin liquid   Liquid Administration via: Cup;Straw Medication Administration: Whole meds with liquid Supervision: Patient able to self feed Postural Changes: Seated upright at 90 degrees    Other  Recommendations     Follow up Recommendations None      Frequency and Duration  Prognosis        Swallow Study   General Type of Study: Bedside Swallow Evaluation Previous Swallow Assessment: none Diet Prior to this Study: NPO Temperature Spikes Noted: No Respiratory Status: Nasal cannula History of Recent Intubation: No Behavior/Cognition: Alert;Requires cueing Oral Cavity Assessment: Within Functional Limits Oral Care Completed by SLP: No Oral Cavity - Dentition: Poor condition;Missing dentition Vision: Functional for self-feeding Self-Feeding Abilities: Able to feed self Patient  Positioning: Upright in bed Baseline Vocal Quality: Normal Volitional Cough: Strong Volitional Swallow: Able to elicit    Oral/Motor/Sensory Function Overall Oral Motor/Sensory Function: Within functional limits   Ice Chips     Thin Liquid Thin Liquid: Within functional limits Presentation: Cup;Straw    Nectar Thick Nectar Thick Liquid: Not tested   Honey Thick Honey Thick Liquid: Not tested   Puree Puree: Within functional limits   Solid     Solid: Within functional limits     Herbie Baltimore, MA [place] Pager [phone] Office [phone]  Lynann Beaver 03/27/2019,2:19 PM

## 2019-03-27 NOTE — Progress Notes (Signed)
CRITICAL VALUE ALERT  Critical Value:  Hgb 6.9  Date & Time Notied:  03/27/2019 @ 0500  Provider Notified: Raliegh Ip Schorr  Orders Received/Actions taken:

## 2019-03-27 NOTE — Consult Note (Signed)
   [facility] CM Inpatient Consult   03/27/2019  [name] 03/22/1951 [id number]   Chart review for active [facility] Management member with [facility] plan.  Patient is currently active with [facility] Management for chronic disease management services.  Patient has been engaged by a [facility] Pharmacist for medication assessment needs.  Our community based plan of care has focused on disease management and community resource support.  Will update [facility] Pharmacist. [place] notes difficulty maintaining contact for follow up referral with son. Chart review reveals patient is currently being followed by Palliative Care inpatient referral.  Will follow up for disposition and needs, if appropriate.   Inpatient [facility] team member to make aware that [place] Management following. Of note, [facility] Care Management services does not replace or interfere with any services that are needed or arranged by inpatient case management or social work.  For additional questions or referrals please contact:  Natividad Brood, RN BSN [facility] Liaison  [phone] business mobile phone Toll free office [phone]

## 2019-03-27 NOTE — Consult Note (Signed)
Consultation Note Date: 03/27/2019   Patient Name: [name]  DOB: Jan 31, 1951  MRN: [id number]  Age / Sex: 68 y.o., female  PCP: [name] Collie, MD Referring Physician: Allie Bossier, MD  Reason for Consultation: Establishing goals of care  HPI/Patient Profile: 68 y.o. female  with past medical history of dementia, seizures, CVA, R hemicolectomy, HTN, COPD (not on home O2, on single med), neobladder, hx pancreatic stent placement 10/2018 admitted on 03/24/2019 with abdominal pain and SOB. Workup revealed COPD exacerbation, salicylate overdose, COVID negative, acute on CKD III, hypoalbuminemia (Albumin 1.9 on admission), anemia, palliative medicine consulted for [name].   Clinical Assessment and Goals of Care: Evaluated patient in bed- she was lethargic, opened eyes on command, but then would close. Was able to say her name, and she was in the hospital. Stated she was in the hospital because she had back spasms from her gall bladder. Not able to tell me the year. She mumbles incoherently at times in between questions. She states she lives at home with her son, [name], and he "helps her". She became agitated with further discussion and did not want to engage in [name] conversation.   Attempted to call patient's son- [name]- left voicemail.  Chart review shows patient has 24 hour care giver at home per son report.  Primary Decision Maker NEXT OF KIN- sonLarene [name]    SUMMARY OF RECOMMENDATIONS -Continue current scope of care -SLP for diet advancement -Left message for patient's son for [name] discussion       Present on Admission: . Acute on chronic respiratory failure with hypoxia ([name]) . COPD (chronic obstructive pulmonary disease) ([place]) . HTN (hypertension) . Subacute delirium . Blind loop syndrome . Acute renal failure superimposed on stage 3 chronic kidney disease ([place]) . Alzheimer's dementia ([place]) .  COPD with acute exacerbation ([place]) . NSAID overdose . Anemia, unspecified   I have reviewed the medical record, interviewed the patient and family, and examined the patient. The following aspects are pertinent.  Past Medical History:  Diagnosis Date  . Acute renal failure superimposed on stage 3 chronic kidney disease ([place]) 03/26/2019  . Alzheimer's dementia ([place])    takes Aricept nightly  . Alzheimer's dementia ([place]) 03/26/2019  . Anemia   . Anemia, unspecified 03/27/2019  . COPD (chronic obstructive pulmonary disease) ([place])   . COPD with acute exacerbation ([place]) 03/26/2019  . Delirium 03/27/2019  . History of blood transfusion    received 2units on Feb 17,2015  . History of MRSA infection    several yrs ago  . HLD (hyperlipidemia) 06/06/2018  . HTN (hypertension) 06/06/2018  . Hyperlipidemia    takes Atorvastatin daily  . Hypertension    takes Amlodipine and Carvedilol daily  . Insomnia    takes Ambien nightly  . Memory loss   . Renal disorder    kidney stones  . Seizures ([place])   . Stroke ([place])   . Subacute delirium 10/14/2013   takes Seroquel and Dilantin nightly  . Toxic encephalopathy   .  UTI (lower urinary tract infection)    takes Macrodantin daily   Social History   Socioeconomic History  . Marital status: Married    Spouse name: Not on file  . Number of children: Not on file  . Years of education: 17  . Highest education level: Not on file  Occupational History  . Not on file  Social Needs  . Financial resource strain: Not on file  . Food insecurity:    Worry: Not on file    Inability: Not on file  . Transportation needs:    Medical: Not on file    Non-medical: Not on file  Tobacco Use  . Smoking status: Current Every Day Smoker    Packs/day: 1.00    Types: Cigarettes  . Smokeless tobacco: Never Used  Substance and Sexual Activity  . Alcohol use: No  . Drug use: No  . Sexual activity: Not on file  Lifestyle  . Physical activity:    Days per week:  Not on file    Minutes per session: Not on file  . Stress: Not on file  Relationships  . Social connections:    Talks on phone: Not on file    Gets together: Not on file    Attends religious service: Not on file    Active member of club or organization: Not on file    Attends meetings of clubs or organizations: Not on file    Relationship status: Not on file  Other Topics Concern  . Not on file  Social History Narrative   10/13/18 Living with son, [name] at home.  Caregiver during the day.   Scheduled Meds: . calcium carbonate (dosed in mg elemental calcium)  500 mg of elemental calcium Per Tube TID AC & HS  . docusate sodium  100 mg Oral BID  . donepezil  10 mg Oral QHS  . doxazosin  2 mg Oral Daily  . enoxaparin (LOVENOX) injection  30 mg Subcutaneous q morning - 10a  . furosemide  40 mg Intravenous Daily  . insulin aspart  0-5 Units Subcutaneous QHS  . insulin aspart  0-9 Units Subcutaneous TID WC  . ipratropium-albuterol  3 mL Nebulization Q6H  . magnesium oxide  400 mg Oral BID  . methylPREDNISolone (SOLU-MEDROL) injection  40 mg Intravenous Q12H  . mirtazapine  45 mg Oral QHS  . phenytoin (DILANTIN) IV  65 mg Intravenous Q8H  . sodium bicarbonate  650 mg Oral TID   Continuous Infusions: . dextrose 5 % and 0.45% NaCl 10 mL/hr at 03/27/19 1103  . levETIRAcetam 400 mL/hr at 03/27/19 1000  . piperacillin-tazobactam (ZOSYN)  IV 3.375 g (03/27/19 1106)   PRN Meds:.acetaminophen, bisacodyl, ondansetron **OR** ondansetron (ZOFRAN) IV, oxyCODONE, polyethylene glycol, sodium chloride flush Medications Prior to Admission:  Prior to Admission medications   Medication Sig Start Date End Date Taking? Authorizing Provider  albuterol (PROVENTIL) (2.5 MG/3ML) 0.083% nebulizer solution Take 2.5 mg by nebulization every 6 (six) hours as needed for wheezing or shortness of breath.   Yes [provider]  atorvastatin (LIPITOR) 20 MG tablet Take 20 mg by mouth daily. 11/07/18  Yes  [provider]  carvedilol (COREG) 6.25 MG tablet Take 6.25 mg by mouth 2 (two) times daily. 01/12/19  Yes [provider]  cephALEXin (KEFLEX) 500 MG capsule Take 500 mg by mouth every 8 (eight) hours.   Yes [provider]  donepezil (ARICEPT) 10 MG tablet TAKE 1 BY MOUTH AT BEDTIME Patient taking differently:  Take 10 mg by mouth at bedtime.  12/08/18  Yes Ward Givens, NP  doxazosin (CARDURA) 2 MG tablet Take 2 mg by mouth daily. 10/21/18  Yes [provider]  ferrous sulfate 325 (65 FE) MG tablet Take 1 tablet (325 mg total) by mouth 2 (two) times daily with a meal. 10/31/18  Yes Irene Pap N, DO  hydrOXYzine (ATARAX/VISTARIL) 25 MG tablet Take 12.5 mg by mouth daily as needed for anxiety.   Yes [provider]  mirtazapine (REMERON) 45 MG tablet Take 1 tablet by mouth at bedtime. 02/20/19  Yes [provider]  ondansetron (ZOFRAN-ODT) 4 MG disintegrating tablet Take 4 mg by mouth every 6 (six) hours as needed for nausea or vomiting.  03/10/19  Yes [provider]  phenytoin (DILANTIN) 100 MG ER capsule Take 2 capsules (200 mg total) by mouth at bedtime. 10/13/18  Yes Ward Givens, NP  sodium bicarbonate 650 MG tablet Take 1 tablet (650 mg total) by mouth 3 (three) times daily. Patient taking differently: Take 650 mg by mouth daily.  10/31/18  Yes Hall, Carole N, DO  amLODipine (NORVASC) 10 MG tablet Take 1 tablet (10 mg total) by mouth every evening. Patient not taking: Reported on 03/24/2019 10/31/18   Kayleen Memos, DO  diclofenac sodium (VOLTAREN) 1 % GEL Apply 2 g topically 3 (three) times daily. Patient not taking: Reported on 03/24/2019 10/31/18   Kayleen Memos, DO   Allergies  Allergen Reactions  . Codeine Nausea And Vomiting    REACTION: intolerance  . Ace Inhibitors   . Latex Other (See Comments)    itching   Review of Systems  Unable to perform ROS: Dementia    Physical Exam Vitals signs and nursing note  reviewed.  Constitutional:      General: She is not in acute distress.    Appearance: She is ill-appearing.     Comments: Frail, cachetic  HENT:     Mouth/Throat:     Mouth: Mucous membranes are dry.  Skin:    Coloration: Skin is pale.  Neurological:     Mental Status: She is disoriented.  Psychiatric:     Comments: Agitated at times     Vital Signs: BP (!) 163/70   Pulse 61   Temp 98.1 F (36.7 C) (Axillary)   Resp 20   Ht 5' (1.524 m)   Wt 45.4 kg   SpO2 100%   BMI 19.55 kg/m  Pain Scale: PAINAD   Pain Score: Asleep   SpO2: SpO2: 100 % O2 Device:SpO2: 100 % O2 Flow Rate: .O2 Flow Rate (L/min): 6 L/min  IO: Intake/output summary:   Intake/Output Summary (Last 24 hours) at 03/27/2019 1155 Last data filed at 03/27/2019 1000 Gross per 24 hour  Intake 1465.94 ml  Output 935 ml  Net 530.94 ml    LBM: Last BM Date: 03/24/19 Baseline Weight: Weight: 45.7 kg Most recent weight: Weight: 45.4 kg     Palliative Assessment/Data: PPS: 10%     Thank you for this consult. Palliative medicine will continue to follow and assist as needed.   Time In: 1045 Time Out: 1145 Time Total: 60 minutes Greater than 50%  of this time was spent counseling and coordinating care related to the above assessment and plan.  Signed by: Mariana Kaufman, AGNP-C Palliative Medicine    Please contact Palliative Medicine Team phone at [phone] for questions and concerns.  For individual provider: See Shea Evans

## 2019-03-27 NOTE — Progress Notes (Signed)
Report called to 5W RN

## 2019-03-27 NOTE — Progress Notes (Signed)
PROGRESS NOTE    [name]  OVZ:[id number] DOB: 1951/11/13 DOA: 03/24/2019 PCP: Marco Collie, MD   Brief Narrative:  68 y.o. 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  She is unable to provide history. Dr Lorin Mercy  On admission called and spoke with her son, Larene Beach.  Her son is her caregiver.  Sunday, she didn't feel well - he thought she had a UTI.  He called urology (Dr. Gerlene Burdock) yesterday and he diagnosed a bad UTI.  Last night, "she just didn't look right."  She was weak and SOB.  He gave her breathing treatments and neb treatments.  She didn't sleep.  She does not have any medication for pain - previously using oxycontin 5 mg.  She has been taking Bayer back and body aches.  She took 2 tabs at a time 2 times during the day, provided by a caregiver.  She does have it in her room and could have taken more.  She also takes Motrin.  No one else is sick in the home.  Her son and nephew both have asthma and neither of them has been ill.  She has not tested for COVID.  No fever but she was clammy last night.  She has COPD and has chronic cough but this was better yesterday.  No GI symptoms.  She is conversant at baseline but has dementia - she recognizes her son but does not know the year, president.  She is oriented to person and place at baseline.  She was intermittently confused yesterday.  She would want to be full code. Transfer from [place] [facility]    Subjective: 4/17 A/O x1 (does not know when, why, where) does know she is in the hospital but believes it is [place]     Assessment & Plan:   Principal Problem:   Acute  on chronic respiratory failure with hypoxia ([place]) Active Problems:   COPD (chronic obstructive pulmonary disease) (HCC)   Blind loop syndrome   Subacute delirium   Seizure disorder ([place])   HTN (hypertension)   Acute renal failure superimposed on stage 3 chronic kidney disease (HCC)   Alzheimer's dementia ([place])   COPD with acute exacerbation (HCC)  NSAID overdose - Most likely unintentional given patient's mental status. - On admission treated with sodium bicarb  Acute hypoxemic respiratory failure -Tested negative for COVID -Very abnormal chest x-ray with multifocal infiltrates concerning for a viral pneumonia -Respiratory virus panel negative - Patient most likely just with COPD exacerbation and no true infection.  However given her comorbidities will treat with short course of antibiotics. - 4/17 decrease Solu-Medrol 40 mg twice daily  - DuoNeb QID - Flutter valve - Pulmonary toilet -Titrate O2 to maintain SPO2 89 to 92% -PCXR 4/17: Pulmonary edema  Pulmonary edema -Lasix 40 mg daily - Strict in and out -Daily weight  Concern for COVID  - See respiratory failure   Chronic obstructive pulmonary disease -See respiratory failure-   Alzheimer's dementia/Hx CVA/hospital delirium? -Unknown baseline - Donepezil 10 mg nightly - Mirtazapine 45 mg nightly - DC Dilaudid  Seizure disorder -Keppra 500 mg twice daily - Phenytoin 65 mg 3 times daily -  Change medications over to PO as soon as patient more cooperative.   Acute on CKD stage III (baseline Cr 1.7) -Avoid nephrotoxic medication - Trend creatinine Recent Labs  Lab 03/24/19 1730 03/25/19 0558 03/26/19 0342 03/27/19 0414  CREATININE 1.98* 1.97* 1.72* 1.50*  -Currently at baseline -4/17 D5W-0.45% saline to [place] HTN -Currently controlled without medication.  Monitor closely   Abdominal pain - KUB negative for ileus or obstruction - N.p.o.  Anemia unspecified - Fecal occult pending - Anemia  panel pending -LDH/haptoglobin pending - Transfuse for hemoglobin<7 -4/17 transfuse 1 unit PRBC  Goals of care - 4/16 palliative care consult: Demented patient with multiple medical problems consider DNR status change        DVT prophylaxis: Lovenox Code Status: Full Family Communication: None Disposition Plan: Daily   Consultants:  Nephrology    Procedures/Significant Events:  4/15 PCXR:-Similar appearance of mixed interstitial and airspace opacities, with rapid worsening in 24-48 hours. Differential includes edema, multifocal infection, drug reaction, acute interstitial pneumonitis. 4/16 KUB: Negative for ileus 4/17 PCXR:-Slight improved aeration of both lungs.  Persistent diffuse interstitial and airspace disease superimposed on COPD. This likely reflects edema. Infection is not excluded.    I have personally reviewed and interpreted all radiology studies and my findings are as above.  VENTILATOR SETTINGS: None   Cultures 4/14 SARS coronavirus negative 4/14 COVUID-19 send out negative 4/16 respiratory virus panel negative    Antimicrobials: Anti-infectives (From admission, onward)   Start     Ordered Stop   03/26/19 1000  hydroxychloroquine (PLAQUENIL) tablet 200 mg  Status:  Discontinued     03/25/19 1140 03/26/19 1226   03/26/19 0800  vancomycin (VANCOCIN) 500 mg in sodium chloride 0.9 % 100 mL IVPB  Status:  Discontinued     03/25/19 0859 03/26/19 0912   03/25/19 1200  azithromycin (ZITHROMAX) 500 mg in sodium chloride 0.9 % 250 mL IVPB  Status:  Discontinued     03/25/19 1118 03/25/19 1303   03/25/19 1145  hydroxychloroquine (PLAQUENIL) tablet 400 mg     03/25/19 1140 03/25/19 2247   03/25/19 1130  hydroxychloroquine (PLAQUENIL) tablet 200 mg  Status:  Discontinued     03/25/19 1118 03/25/19 1140   03/25/19 0000  piperacillin-tazobactam (ZOSYN) IVPB 3.375 g     03/24/19 2014     03/24/19 2013  vancomycin variable dose per unstable renal function  (pharmacist dosing)  Status:  Discontinued     03/24/19 2013 03/25/19 0859   03/24/19 1800  piperacillin-tazobactam (ZOSYN) IVPB 3.375 g     03/24/19 1724 03/24/19 2020   03/24/19 1800  vancomycin (VANCOCIN) 1,000 mg in sodium chloride 0.9 % 250 mL IVPB     03/24/19 1724 03/24/19 2114       Devices    LINES / TUBES:      Continuous Infusions: . dextrose 5 % and 0.45% NaCl 50 mL/hr at 03/27/19 0800  . famotidine (PEPCID) IV Stopped (03/26/19 1302)  . levETIRAcetam Stopped (03/26/19 2227)  . piperacillin-tazobactam (ZOSYN)  IV Stopped (03/27/19 0739)     Objective: Vitals:   03/27/19 0500 03/27/19 0600 03/27/19 0700 03/27/19 0800  BP: (!) 152/59 (!) 153/58 (!) 166/78 (!) 159/54  Pulse: 70 73 65 67  Resp: (!) 21 16 18 20   Temp:      TempSrc:      SpO2: 100% 100% 100% 100%  Weight: 45.4 kg     Height:        Intake/Output Summary (Last 24  hours) at 03/27/2019 0811 Last data filed at 03/27/2019 0800 Gross per 24 hour  Intake 1376.36 ml  Output 835 ml  Net 541.36 ml   Filed Weights   03/25/19 0022 03/26/19 0100 03/27/19 0500  Weight: 45.7 kg 44.9 kg 45.4 kg   General: A/O x1 (does not know when, why, where).  Does know she is in the hospital.  Positive acute respiratory distress, cachectic Eyes: negative scleral hemorrhage, negative anisocoria, negative icterus ENT: Negative Runny nose, negative gingival bleeding,, poor dentation Neck:  Negative scars, masses, torticollis, lymphadenopathy, JVD Lungs: Diffuse poor air movement, positive rhonchi, without wheezes or crackles tachypneic Cardiovascular: Tachycardic, without murmur gallop or rub normal S1 and S2 Abdomen: negative abdominal pain, nondistended, positive soft, bowel sounds, no rebound, no ascites, no appreciable mass Extremities: No significant cyanosis, clubbing, or edema bilateral lower extremities Skin: Negative rashes, lesions, ulcers Psychiatric: Unable to fully assess secondary to altered mental status   Central nervous system:  Cranial nerves II through XII intact, moves all extremities to command.  Positive expressive aphasia        Data Reviewed: Care during the described time interval was provided by me .  I have reviewed this patient's available data, including medical history, events of note, physical examination, and all test results as part of my evaluation.   CBC: Recent Labs  Lab 03/25/19 0558 03/26/19 0342 03/26/19 0844 03/27/19 0414  WBC 8.5 6.2  --  7.1  NEUTROABS 6.9 5.4  --  6.1  HGB 7.5* 7.2* 7.5* 6.9*  HCT 23.3* 23.5* 22.0* 23.0*  MCV 91.7 97.5  --  100.0  PLT 101* 98*  --  497*   Basic Metabolic Panel: Recent Labs  Lab 03/24/19 1730 03/25/19 0558 03/25/19 0811 03/25/19 1130 03/26/19 0342 03/26/19 0844 03/27/19 0414  NA 143 144  --   --  141 144 143  K 2.6* 2.9*  --   --  4.3 4.0 3.7  CL 113* 105  --   --  108  --  109  CO2 16* 26  --   --  24  --  26  GLUCOSE 86 112*  --   --  274*  --  129*  BUN 28* 26*  --   --  23  --  17  CREATININE 1.98* 1.97*  --   --  1.72*  --  1.50*  CALCIUM 6.0* 5.8*  --   --  6.3*  --  7.1*  MG  --   --  1.2*  --  1.4*  --  2.2  PHOS  --   --   --  3.4  --   --  3.4   GFR: Estimated Creatinine Clearance: 26.1 mL/min (A) (by C-G formula based on SCr of 1.5 mg/dL (H)). Liver Function Tests: Recent Labs  Lab 03/24/19 1730 03/25/19 0558 03/26/19 0342 03/27/19 0414  AST 32 35 29 19  ALT 6 8 7 9   ALKPHOS 69 62 55 50  BILITOT 0.5 0.4 0.4 0.3  PROT 4.2* 4.2* 4.3* 4.5*  ALBUMIN 1.9* 1.7* 1.8* 1.8*   No results for input(s): LIPASE, AMYLASE in the last 168 hours. No results for input(s): AMMONIA in the last 168 hours. Coagulation Profile: No results for input(s): INR, PROTIME in the last 168 hours. Cardiac Enzymes: Recent Labs  Lab 03/24/19 1730 03/26/19 0342 03/26/19 1011 03/26/19 1805 03/26/19 2202  TROPONINI <0.03 <0.03 <0.03 <0.03 <0.03   BNP (last 3 results) No results for input(s): PROBNP  in the  last 8760 hours. HbA1C: No results for input(s): HGBA1C in the last 72 hours. CBG: Recent Labs  Lab 03/26/19 1629 03/26/19 2127  GLUCAP 98 121*   Lipid Profile: No results for input(s): CHOL, HDL, LDLCALC, TRIG, CHOLHDL, LDLDIRECT in the last 72 hours. Thyroid Function Tests: No results for input(s): TSH, T4TOTAL, FREET4, T3FREE, THYROIDAB in the last 72 hours. Anemia Panel: Recent Labs    03/24/19 1730  FERRITIN 151   Urine analysis:    Component Value Date/Time   COLORURINE YELLOW 03/24/2019 1400   APPEARANCEUR CLOUDY (A) 03/24/2019 1400   LABSPEC 1.019 03/24/2019 1400   PHURINE 5.0 03/24/2019 1400   GLUCOSEU 150 (A) 03/24/2019 1400   HGBUR MODERATE (A) 03/24/2019 1400   BILIRUBINUR NEGATIVE 03/24/2019 1400   KETONESUR NEGATIVE 03/24/2019 1400   PROTEINUR 30 (A) 03/24/2019 1400   UROBILINOGEN 0.2 01/24/2014 0500   NITRITE NEGATIVE 03/24/2019 1400   LEUKOCYTESUR LARGE (A) 03/24/2019 1400   Sepsis Labs: @LABRCNTIP (procalcitonin:4,lacticidven:4)  ) Recent Results (from the past 240 hour(s))  SARS Coronavirus 2 [facility] order, Performed in [place] hospital lab)     Status: None   Collection Time: 03/24/19  2:09 PM  Result Value Ref Range Status   SARS Coronavirus 2 NEGATIVE NEGATIVE Final    Comment: (NOTE) If result is NEGATIVE SARS-CoV-2 target nucleic acids are NOT DETECTED. The SARS-CoV-2 RNA is generally detectable in upper and lower  respiratory specimens during the acute phase of infection. The lowest  concentration of SARS-CoV-2 viral copies this assay can detect is 250  copies / mL. A negative result does not preclude SARS-CoV-2 infection  and should not be used as the sole basis for treatment or other  patient management decisions.  A negative result may occur with  improper specimen collection / handling, submission of specimen other  than nasopharyngeal swab, presence of viral mutation(s) within the  areas targeted by this assay, and inadequate  number of viral copies  (<250 copies / mL). A negative result must be combined with clinical  observations, patient history, and epidemiological information. If result is POSITIVE SARS-CoV-2 target nucleic acids are DETECTED. The SARS-CoV-2 RNA is generally detectable in upper and lower  respiratory specimens dur ing the acute phase of infection.  Positive  results are indicative of active infection with SARS-CoV-2.  Clinical  correlation with patient history and other diagnostic information is  necessary to determine patient infection status.  Positive results do  not rule out bacterial infection or co-infection with other viruses. If result is PRESUMPTIVE POSTIVE SARS-CoV-2 nucleic acids MAY BE PRESENT.   A presumptive positive result was obtained on the submitted specimen  and confirmed on repeat testing.  While 2019 novel coronavirus  (SARS-CoV-2) nucleic acids may be present in the submitted sample  additional confirmatory testing may be necessary for epidemiological  and / or clinical management purposes  to differentiate between  SARS-CoV-2 and other Sarbecovirus currently known to infect humans.  If clinically indicated additional testing with an alternate test  methodology [phone]) is advised. The SARS-CoV-2 RNA is generally  detectable in upper and lower respiratory sp ecimens during the acute  phase of infection. The expected result is Negative. Fact Sheet for Patients:  StrictlyIdeas.no Fact Sheet for Healthcare Providers: BankingDealers.co.za This test is not yet approved or cleared by the [place] FDA and has been authorized for detection and/or diagnosis of SARS-CoV-2 by FDA under an Emergency Use Authorization (EUA).  This EUA will remain in effect (meaning  this test can be used) for the duration of the COVID-19 declaration under Section 564(b)(1) of the Act, 21 U.S.C. section 360bbb-3(b)(1), unless the  authorization is terminated or revoked sooner. Performed at [facility], [place] [address] [place], [place] [address]   Novel Coronavirus, NAA (hospital order; send-out to ref lab)     Status: None   Collection Time: 03/24/19  2:09 PM  Result Value Ref Range Status   SARS-CoV-2, NAA NOT DETECTED NOT DETECTED Final    Comment: (NOTE) This test was developed and its performance characteristics determined by [employer]. This test has not been FDA cleared or approved. This test has been authorized by FDA under an Emergency Use Authorization (EUA). This test has been validated in accordance with the FDA's Guidance Document (Policy for [facility] in Laboratories Certified to Perform High Complexity Testing under CLIA prior to Emergency Use Authorization for Coronavirus [id number] during the [facility] Emergency) issued on February 29th, 2020. FDA independent review of this validation is pending. This test is only authorized for the duration of time the declaration that circumstances exist justifying the authorization of the emergency use of in vitro diagnostic tests for detection of SARS-CoV- 2 virus and/or diagnosis of COVID-19 infection under section 564(b)(1) of the Act, 21 U.S.C. 356POL-4(D)(0), unless the authorization is terminated or revoked sooner. Performed At: [facility] [address] [place], [place] [id number] Rush Farmer MD [place]:[id number]    [place]  Final    Comment: Performed at [facility], [place] [address], [place], [place] [address]  Culture, blood (Routine X 2) w Reflex to ID Panel     Status: None (Preliminary result)   Collection Time: 03/24/19  3:35 PM  Result Value Ref Range Status   Specimen Description BLOOD RIGHT PICC LINE  Final   Special Requests   Final    BOTTLES DRAWN AEROBIC ONLY Blood Culture adequate volume   Culture   Final    NO GROWTH 2 DAYS Performed at [place], [place] [address] [place], [place] [address]    Report Status PENDING  Incomplete  Culture, blood (Routine X 2) w Reflex to ID Panel     Status: None (Preliminary result)   Collection Time: 03/24/19  4:25 PM  Result Value Ref Range Status   Specimen Description BLOOD RIGHT HAND  Final   Special Requests   Final    BOTTLES DRAWN AEROBIC ONLY Blood Culture results may not be optimal due to an inadequate volume of blood received in culture bottles   Culture   Final    NO GROWTH 2 DAYS Performed at [facility], [place] [address] [place], [place] [address]    Report Status PENDING  Incomplete  MRSA PCR Screening     Status: None   Collection Time: 03/25/19  6:22 PM  Result Value Ref Range Status   MRSA by PCR NEGATIVE NEGATIVE Final    Comment:        The GeneXpert MRSA Assay (FDA approved for NASAL specimens only), is one component of a comprehensive MRSA colonization surveillance program. It is not intended to diagnose MRSA infection nor to guide or monitor treatment for MRSA infections. Performed at [facility], [place] [address], [place], [place] [address]   Respiratory Panel by PCR     Status: None   Collection Time: 03/26/19  8:36 AM  Result Value Ref Range Status   Adenovirus NOT DETECTED NOT  DETECTED Final   Coronavirus 229E NOT DETECTED NOT DETECTED Final    Comment: (NOTE) The Coronavirus on the Respiratory Panel, DOES NOT test for the novel  Coronavirus (2019 nCoV)    Coronavirus HKU1 NOT DETECTED NOT DETECTED Final   Coronavirus NL63 NOT DETECTED NOT DETECTED Final   Coronavirus OC43 NOT DETECTED NOT DETECTED Final   Metapneumovirus NOT DETECTED NOT DETECTED Final   Rhinovirus / Enterovirus NOT DETECTED NOT DETECTED Final   Influenza A NOT DETECTED NOT DETECTED Final   Influenza B NOT DETECTED NOT DETECTED Final   Parainfluenza Virus 1 NOT DETECTED NOT DETECTED Final   Parainfluenza Virus 2 NOT DETECTED NOT DETECTED Final   Parainfluenza Virus 3 NOT  DETECTED NOT DETECTED Final   Parainfluenza Virus 4 NOT DETECTED NOT DETECTED Final   Respiratory Syncytial Virus NOT DETECTED NOT DETECTED Final   Bordetella pertussis NOT DETECTED NOT DETECTED Final   Chlamydophila pneumoniae NOT DETECTED NOT DETECTED Final   Mycoplasma pneumoniae NOT DETECTED NOT DETECTED Final    Comment: Performed at [facility], [place] [address] [place], [place] [address]         Radiology Studies: Dg Chest Port 1 View  Result Date: 03/27/2019 CLINICAL DATA:  Pulmonary edema. EXAM: PORTABLE CHEST 1 VIEW COMPARISON:  One-view chest x-ray 03/26/2019 FINDINGS: Heart size is normal. Atherosclerotic changes are noted at the aorta. Diffuse interstitial and airspace disease is superimposed on chronic COPD. Aeration is slightly improved since yesterday's study. Remote right-sided rib fractures are again seen. IMPRESSION: 1. Slight improved aeration of both lungs. 2. Persistent diffuse interstitial and airspace disease superimposed on COPD. This likely reflects edema. Infection is not excluded. 3. Aortic atherosclerosis. Electronically Signed   By: San Morelle M.D.   On: 03/27/2019 07:46   Dg Chest Port 1 View  Result Date: 03/26/2019 CLINICAL DATA:  Shortness of breath. EXAM: PORTABLE CHEST 1 VIEW COMPARISON:  03/25/2019 FINDINGS: Right-sided PICC line unchanged with tip just above the cavoatrial junction. Lungs are adequately inflated with no change in moderate bilateral diffuse mixed interstitial airspace opacification. No significant effusion. Cardiomediastinal silhouette and remainder of the exam is unchanged. IMPRESSION: Moderate stable bilateral mixed interstitial/airspace process. Right-sided PICC line unchanged. Electronically Signed   By: Marin Olp M.D.   On: 03/26/2019 08:21   Dg Chest Port 1 View  Result Date: 03/25/2019 CLINICAL DATA:  67 year old female with respiratory distress EXAM: PORTABLE CHEST 1 VIEW COMPARISON:  03/24/2019, 02/15/2019  FINDINGS: Cardiomediastinal silhouette unchanged in size and contour. Calcifications of the aortic arch. Similar appearance of diffuse reticulonodular pattern opacity with interlobular septal thickening and symmetric airspace opacities with no gradient from superior to inferior. Unchanged right upper extremity PICC IMPRESSION: Similar appearance of mixed interstitial and airspace opacities, with rapid worsening in 24-48 hours. Differential includes edema, multifocal infection, drug reaction, acute interstitial pneumonitis. Unchanged right upper extremity PICC Electronically Signed   By: Corrie Mckusick D.O.   On: 03/25/2019 14:27   Dg Abd Portable 1v  Result Date: 03/26/2019 CLINICAL DATA:  Lower abdominal pain and diarrhea.  Dementia. EXAM: PORTABLE ABDOMEN - 1 VIEW COMPARISON:  CT 03/24/2019 FINDINGS: Bowel gas pattern does not show evidence of ileus or obstruction. Double-J ureteral stent in the left kidney and within the transplanted ureter. Multiple surgical clips in the pelvis. Left iliac artery stent. Cholecystectomy clips. No acute bone finding. IMPRESSION: No acute radiographic finding. Electronically Signed   By: Nelson Chimes M.D.   On: 03/26/2019 15:58  Scheduled Meds: . sodium chloride   Intravenous Once  . calcium carbonate (dosed in mg elemental calcium)  500 mg of elemental calcium Per Tube TID AC & HS  . docusate sodium  100 mg Oral BID  . donepezil  10 mg Oral QHS  . enoxaparin (LOVENOX) injection  30 mg Subcutaneous q morning - 10a  . insulin aspart  0-5 Units Subcutaneous QHS  . insulin aspart  0-9 Units Subcutaneous TID WC  . ipratropium-albuterol  3 mL Nebulization Q6H  . magnesium oxide  400 mg Oral BID  . methylPREDNISolone (SOLU-MEDROL) injection  40 mg Intravenous Q8H  . mirtazapine  45 mg Oral QHS  . phenytoin (DILANTIN) IV  65 mg Intravenous Q8H  . sodium bicarbonate  650 mg Oral TID   Continuous Infusions: . dextrose 5 % and 0.45% NaCl 50 mL/hr at 03/27/19  0800  . famotidine (PEPCID) IV Stopped (03/26/19 1302)  . levETIRAcetam Stopped (03/26/19 2227)  . piperacillin-tazobactam (ZOSYN)  IV Stopped (03/27/19 0739)     LOS: 3 days   The patient is critically ill with multiple organ systems failure and requires high complexity decision making for assessment and support, frequent evaluation and titration of therapies, application of advanced monitoring technologies and extensive interpretation of multiple databases. Critical Care Time devoted to patient care services described in this note  Time spent: 40 minutes     , Geraldo Docker, MD [facility] Pager [phone]  If 7PM-7AM, please contact night-coverage [email or web address] Password [email or web address] 03/27/2019, 8:11 AM

## 2019-03-27 NOTE — Progress Notes (Signed)
Additional non face to face time:   Spoke with patient's sonLarene Beach via phone due to [place] visitor restrictions-  Per Larene Beach- prior to admission patient was ambulatory in the home- able to complete light housework, independent with ADL's. She did have 24hr caregiver paid for by [employer] benefits from his father who is deceased. She feeds herself, but does not prepare her own meals.  Larene Beach has noticed increase in her SOB over the last few months. He has requested home oxygen from her primary MD, but this has been denied because she continues to smoke. Advanced care planning and EOL wishes was discussed- Larene Beach states that his mother would not want to be on a ventilator, would not want CPR, He agrees that DNR order is inline with [place].  We discussed her illness trajectory. He states he has had two family members die from COPD and he understands his mother's path and they have discussed it. Larene Beach states that he would like to keep his mother at home with the 24 hour caregiver for as long as possible, even until EOL.  Palliative and Hospice services were discussed. Larene Beach agrees to Palliative services at home upon discharge.  Mariana Kaufman, AGNP-C Palliative Medicine  Please call Palliative Medicine team phone with any questions [phone]. For individual providers please see AMION.  Total phone time: 40 minutes

## 2019-03-28 ENCOUNTER — Inpatient Hospital Stay (HOSPITAL_COMMUNITY): Payer: Medicare Other

## 2019-03-28 LAB — COMPREHENSIVE METABOLIC PANEL
ALT: 8 U/L (ref 0–44)
AST: 14 U/L — ABNORMAL LOW (ref 15–41)
Albumin: 1.8 g/dL — ABNORMAL LOW (ref 3.5–5.0)
Alkaline Phosphatase: 48 U/L (ref 38–126)
Anion gap: 11 (ref 5–15)
BUN: 16 mg/dL (ref 8–23)
CO2: 29 mmol/L (ref 22–32)
Calcium: 7.2 mg/dL — ABNORMAL LOW (ref 8.9–10.3)
Chloride: 107 mmol/L (ref 98–111)
Creatinine, Ser: 1.58 mg/dL — ABNORMAL HIGH (ref 0.44–1.00)
GFR calc Af Amer: 39 mL/min — ABNORMAL LOW (ref >60)
GFR calc non Af Amer: 34 mL/min — ABNORMAL LOW (ref >60)
Glucose, Bld: 104 mg/dL — ABNORMAL HIGH (ref 70–99)
Potassium: 3.1 mmol/L — ABNORMAL LOW (ref 3.5–5.1)
Sodium: 147 mmol/L — ABNORMAL HIGH (ref 135–145)
Total Bilirubin: 0.6 mg/dL (ref 0.3–1.2)
Total Protein: 4.6 g/dL — ABNORMAL LOW (ref 6.5–8.1)

## 2019-03-28 LAB — TYPE AND SCREEN
ABO/RH(D): A POS
Antibody Screen: NEGATIVE
Unit division: 0

## 2019-03-28 LAB — LEGIONELLA PNEUMOPHILA SEROGP 1 UR AG: L. pneumophila Serogp 1 Ur Ag: NEGATIVE

## 2019-03-28 LAB — CBC WITH DIFFERENTIAL/PLATELET
Abs Immature Granulocytes: 0.09 10*3/uL — ABNORMAL HIGH (ref 0.00–0.07)
Abs Immature Granulocytes: 0.05 10*3/uL (ref 0.00–0.07)
Basophils Absolute: 0 10*3/uL (ref 0.0–0.1)
Basophils Absolute: 0 10*3/uL (ref 0.0–0.1)
Basophils Relative: 0 %
Basophils Relative: 0 %
Eosinophils Absolute: 0 10*3/uL (ref 0.0–0.5)
Eosinophils Absolute: 0 10*3/uL (ref 0.0–0.5)
Eosinophils Relative: 1 %
Eosinophils Relative: 0 %
HCT: 27.3 % — ABNORMAL LOW (ref 36.0–46.0)
HCT: 29.5 % — ABNORMAL LOW (ref 36.0–46.0)
Hemoglobin: 8.1 g/dL — ABNORMAL LOW (ref 12.0–15.0)
Hemoglobin: 9 g/dL — ABNORMAL LOW (ref 12.0–15.0)
Immature Granulocytes: 1 %
Immature Granulocytes: 1 %
Lymphocytes Relative: 16 %
Lymphocytes Relative: 17 %
Lymphs Abs: 1.2 10*3/uL (ref 0.7–4.0)
Lymphs Abs: 1.2 10*3/uL (ref 0.7–4.0)
MCH: 28.6 pg (ref 26.0–34.0)
MCH: 29.5 pg (ref 26.0–34.0)
MCHC: 29.7 g/dL — ABNORMAL LOW (ref 30.0–36.0)
MCHC: 30.5 g/dL (ref 30.0–36.0)
MCV: 96.5 fL (ref 80.0–100.0)
MCV: 96.7 fL (ref 80.0–100.0)
Monocytes Absolute: 0.3 10*3/uL (ref 0.1–1.0)
Monocytes Absolute: 0.4 10*3/uL (ref 0.1–1.0)
Monocytes Relative: 5 %
Monocytes Relative: 5 %
Neutro Abs: 5.9 10*3/uL (ref 1.7–7.7)
Neutro Abs: 5.4 10*3/uL (ref 1.7–7.7)
Neutrophils Relative %: 77 %
Neutrophils Relative %: 77 %
Platelets: 106 10*3/uL — ABNORMAL LOW (ref 150–400)
Platelets: 97 10*3/uL — ABNORMAL LOW (ref 150–400)
RBC: 2.83 MIL/uL — ABNORMAL LOW (ref 3.87–5.11)
RBC: 3.05 MIL/uL — ABNORMAL LOW (ref 3.87–5.11)
RDW: 15.9 % — ABNORMAL HIGH (ref 11.5–15.5)
RDW: 15.9 % — ABNORMAL HIGH (ref 11.5–15.5)
WBC: 7.5 10*3/uL (ref 4.0–10.5)
WBC: 7.1 10*3/uL (ref 4.0–10.5)
nRBC: 0 % (ref 0.0–0.2)
nRBC: 0 % (ref 0.0–0.2)

## 2019-03-28 LAB — BPAM RBC
Blood Product Expiration Date: 202004252359
ISSUE DATE / TIME: 202004170840
Unit Type and Rh: 6200

## 2019-03-28 LAB — FERRITIN: Ferritin: 115 ng/mL (ref 11–307)

## 2019-03-28 LAB — GLUCOSE, CAPILLARY
Glucose-Capillary: 85 mg/dL (ref 70–99)
Glucose-Capillary: 127 mg/dL — ABNORMAL HIGH (ref 70–99)
Glucose-Capillary: 101 mg/dL — ABNORMAL HIGH (ref 70–99)
Glucose-Capillary: 62 mg/dL — ABNORMAL LOW (ref 70–99)
Glucose-Capillary: 126 mg/dL — ABNORMAL HIGH (ref 70–99)

## 2019-03-28 LAB — MAGNESIUM: Magnesium: 2 mg/dL (ref 1.7–2.4)

## 2019-03-28 LAB — C-REACTIVE PROTEIN: CRP: 11.6 mg/dL — ABNORMAL HIGH (ref <1.0)

## 2019-03-28 LAB — HAPTOGLOBIN: Haptoglobin: 196 mg/dL (ref 37–355)

## 2019-03-28 LAB — BRAIN NATRIURETIC PEPTIDE: B Natriuretic Peptide: 883.8 pg/mL — ABNORMAL HIGH (ref 0.0–100.0)

## 2019-03-28 LAB — D-DIMER, QUANTITATIVE: D-Dimer, Quant: 2.56 ug/mL-FEU — ABNORMAL HIGH (ref 0.00–0.50)

## 2019-03-28 MED ORDER — POTASSIUM CHLORIDE CRYS ER 20 MEQ PO TBCR
40.0000 meq | EXTENDED_RELEASE_TABLET | Freq: Once | ORAL | Status: AC
  Administered 2019-03-28: 40 meq via ORAL
  Filled 2019-03-28: qty 2

## 2019-03-28 MED ORDER — POTASSIUM CHLORIDE CRYS ER 20 MEQ PO TBCR
20.0000 meq | EXTENDED_RELEASE_TABLET | Freq: Once | ORAL | Status: AC
  Administered 2019-03-28: 20 meq via ORAL
  Filled 2019-03-28: qty 1

## 2019-03-28 MED ORDER — MORPHINE SULFATE 10 MG/5ML PO SOLN
2.5000 mg | ORAL | Status: DC | PRN
  Administered 2019-03-28 – 2019-03-30 (×6): 2.5 mg via ORAL
  Filled 2019-03-28 (×6): qty 2

## 2019-03-28 MED ORDER — CARVEDILOL 6.25 MG PO TABS
6.2500 mg | ORAL_TABLET | Freq: Two times a day (BID) | ORAL | Status: DC
  Administered 2019-03-28 – 2019-03-30 (×5): 6.25 mg via ORAL
  Filled 2019-03-28 (×5): qty 1

## 2019-03-28 MED ORDER — ALBUTEROL SULFATE HFA 108 (90 BASE) MCG/ACT IN AERS: 1.0000 | INHALATION_SPRAY | RESPIRATORY_TRACT | Status: DC | PRN

## 2019-03-28 MED ORDER — POTASSIUM CL IN DEXTROSE 5% 20 MEQ/L IV SOLN
20.0000 meq | INTRAVENOUS | Status: DC
  Administered 2019-03-28: 20 meq via INTRAVENOUS
  Filled 2019-03-28: qty 1000

## 2019-03-28 MED ORDER — ALBUTEROL SULFATE (2.5 MG/3ML) 0.083% IN NEBU: 2.5000 mg | INHALATION_SOLUTION | RESPIRATORY_TRACT | Status: DC | PRN

## 2019-03-28 MED ORDER — POTASSIUM CL IN DEXTROSE 5% 20 MEQ/L IV SOLN
20.0000 meq | INTRAVENOUS | Status: DC
  Administered 2019-03-29: 20 meq via INTRAVENOUS
  Filled 2019-03-28: qty 1000

## 2019-03-28 MED ORDER — FUROSEMIDE 10 MG/ML IJ SOLN
INTRAMUSCULAR | Status: AC
  Administered 2019-03-28: 40 mg
  Filled 2019-03-28: qty 4

## 2019-03-28 MED ORDER — FUROSEMIDE 10 MG/ML IJ SOLN
60.0000 mg | Freq: Once | INTRAMUSCULAR | Status: AC
  Administered 2019-03-28: 60 mg via INTRAVENOUS

## 2019-03-28 MED ORDER — FUROSEMIDE 10 MG/ML IJ SOLN
INTRAMUSCULAR | Status: AC
  Administered 2019-03-28: 20 mg via INTRAVENOUS
  Filled 2019-03-28: qty 2

## 2019-03-28 MED ORDER — NITROGLYCERIN 2 % TD OINT
1.0000 [in_us] | TOPICAL_OINTMENT | Freq: Four times a day (QID) | TRANSDERMAL | Status: DC
  Administered 2019-03-28 – 2019-03-30 (×6): 1 [in_us] via TOPICAL
  Filled 2019-03-28 (×2): qty 30

## 2019-03-28 MED ORDER — ORAL CARE MOUTH RINSE
15.0000 mL | Freq: Two times a day (BID) | OROMUCOSAL | Status: DC
  Administered 2019-03-28 – 2019-03-30 (×4): 15 mL via OROMUCOSAL

## 2019-03-28 MED ORDER — FUROSEMIDE 10 MG/ML IJ SOLN
60.0000 mg | Freq: Once | INTRAMUSCULAR | Status: AC
  Administered 2019-03-28: 20 mg via INTRAVENOUS
  Filled 2019-03-28: qty 6

## 2019-03-28 NOTE — Progress Notes (Signed)
PROGRESS NOTE    [name]  POE:[id number] DOB: [birth year] DOA: 03/24/2019 PCP: Marco Collie, MD   Brief Narrative:   68 y.o. 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  She is unable to provide history.  Was transferred from [facility] ER with diagnosis of UTI, possible URI and hypoxic respiratory failure along with aspirin overdose.  COVID-19 was ruled out here.  She was transferred to my service on 03/28/2019 on day 5 of her hospital stay in fulminant acute hypoxic respiratory failure requiring nonrebreather mask.  Advanced dementia patient unable to give proper history or review of systems.  She is apparently DNR.     Subjective: In bed confused but appears to be in mild respiratory distress, unreliable historian, unable to answer questions or follow commands reliably.    Assessment & Plan:   NSAID overdose - Most likely unintentional given patient's mental status. - On admission treated with sodium bicarb, clinically resolved  Acute hypoxemic respiratory failure -Tested negative for COVID later this admission, CT chest shows bilateral groundglass opacities and she remains quite hypoxic requiring nonrebreather oxygen, continue supportive care with oxygen and nebulizer treatment, my clinical patient for COVID-19 infection remains high.  Recheck CRP, d-dimer, ferritin, BNP, give trial of Lasix to rule out underlying acute on chronic diastolic CHF.  For now placed the patient on a facemask along with droplet precaution and monitor.  Prognosis is extremely guarded.  Acute on chronic diastolic CHF EF 31%.  Trial  of Lasix.  Check BNP and monitor.  Recent echo from 2019 reviewed with a preserved EF of 60%.    Chronic obstructive pulmonary disease -See respiratory failure-   Alzheimer's dementia/Hx CVA/hospital delirium? -Unknown baseline - Donepezil 10 mg nightly - Mirtazapine 45 mg nightly - DC Dilaudid  Seizure disorder -Keppra 500 mg twice daily - Phenytoin 65 mg 3 times daily -Change medications over to PO as soon as patient more cooperative.   Acute on CKD stage III   Avoid nephrotoxins with gentle hydration renal function improving.  Appears to be close to 1.5 - 1.7.  Essential HTN -Currently controlled without medication.  Monitor closely   Abdominal pain - KUB negative for ileus or obstruction - N.p.o.  Anemia unspecified - Fecal occult pending - Anemia panel pending -LDH/haptoglobin pending - Transfuse for hemoglobin<7 -4/17 transfuse 1 unit PRBC  Goals of care - 4/16 palliative care consult: Demented patient with multiple medical problems consider DNR status change        DVT prophylaxis: Lovenox Code Status: Full Family Communication: None Disposition Plan: Daily   Consultants:  Nephrology, palliative care    Procedures/Significant Events:  4/15 PCXR:-Similar appearance of mixed interstitial and airspace opacities, with rapid worsening in 24-48 hours. Differential includes edema, multifocal infection, drug reaction, acute interstitial pneumonitis. 4/16 KUB: Negative for ileus 4/17 PCXR:-Slight improved aeration of both lungs.  Persistent diffuse interstitial and airspace disease superimposed on COPD. This likely reflects edema. Infection is not excluded.  TTE 05/2018  - Left ventricle: The cavity size was normal. Wall thickness was normal. Systolic function was normal. The estimated ejection fraction was in the range of 60% to 65%. Wall motion  was normal; there were no regional wall motion abnormalities. Doppler parameters are consistent with abnormal  left ventricular  relaxation (grade 1 diastolic dysfunction). - Aortic valve: There was mild regurgitation. Valve area (VTI):   1.99 cm^2. Valve area (Vmax): 1.97 cm^2. Valve area (Vmean): 1.76  cm^2. - Mitral valve: There was mild regurgitation.    I have personally reviewed and interpreted all radiology studies and my findings are as above.  VENTILATOR SETTINGS: None   Cultures 4/14 SARS coronavirus negative 4/14 COVUID-19 send out negative 4/16 respiratory virus panel negative    Antimicrobials: Anti-infectives (From admission, onward)   Start     Ordered Stop   03/26/19 1000  hydroxychloroquine (PLAQUENIL) tablet 200 mg  Status:  Discontinued     03/25/19 1140 03/26/19 1226   03/26/19 0800  vancomycin (VANCOCIN) 500 mg in sodium chloride 0.9 % 100 mL IVPB  Status:  Discontinued     03/25/19 0859 03/26/19 0912   03/25/19 1200  azithromycin (ZITHROMAX) 500 mg in sodium chloride 0.9 % 250 mL IVPB  Status:  Discontinued     03/25/19 1118 03/25/19 1303   03/25/19 1145  hydroxychloroquine (PLAQUENIL) tablet 400 mg     03/25/19 1140 03/25/19 2247   03/25/19 1130  hydroxychloroquine (PLAQUENIL) tablet 200 mg  Status:  Discontinued     03/25/19 1118 03/25/19 1140   03/25/19 0000  piperacillin-tazobactam (ZOSYN) IVPB 3.375 g     03/24/19 2014     03/24/19 2013  vancomycin variable dose per unstable renal function (pharmacist dosing)  Status:  Discontinued     03/24/19 2013 03/25/19 0859   03/24/19 1800  piperacillin-tazobactam (ZOSYN) IVPB 3.375 g     03/24/19 1724 03/24/19 2020   03/24/19 1800  vancomycin (VANCOCIN) 1,000 mg in sodium chloride 0.9 % 250 mL IVPB     03/24/19 1724 03/24/19 2114       Devices    LINES / TUBES:      Continuous Infusions: . dextrose 5 % with KCl 20 mEq / L 20 mEq (03/28/19 1159)  . levETIRAcetam 500 mg (03/28/19 1222)  . piperacillin-tazobactam (ZOSYN)  IV 3.375 g (03/28/19 1207)     Objective: Vitals:   03/27/19 1947 03/27/19 1957  03/28/19 0502 03/28/19 0814  BP:  (!) 162/54 (!) 164/73   Pulse:  72 84   Resp:  16 16   Temp:  98.1 F (36.7 C) 97.6 F (36.4 C)   TempSrc:      SpO2: 93% 94% 100% 100%  Weight:      Height:        Intake/Output Summary (Last 24 hours) at 03/28/2019 1340 Last data filed at 03/28/2019 0900 Gross per 24 hour  Intake 56.51 ml  Output 1600 ml  Net -1543.49 ml   Filed Weights   03/26/19 0100 03/27/19 0500 03/27/19 1056  Weight: 44.9 kg 45.4 kg 46.5 kg   Exam  Awake but confused, No new F.N deficits,   [place].AT,PERRAL Supple Neck,No JVD, No cervical lymphadenopathy appriciated.  Symmetrical Chest wall movement, Good air movement bilaterally, +ve rales RRR,No Gallops, Rubs or new Murmurs, No Parasternal Heave +ve B.Sounds, Abd Soft, No tenderness, No organomegaly appriciated, No rebound - guarding or rigidity. No Cyanosis, Clubbing or edema, No new Rash or bruise   Data Reviewed: Care during the described time interval was provided by me .  I have reviewed this patient's available data, including medical history, events of note, physical examination, and all test results as part of  my evaluation.   CBC: Recent Labs  Lab 03/25/19 0558 03/26/19 0342 03/26/19 0844 03/27/19 0414 03/27/19 1456 03/28/19 0323  WBC 8.5 6.2  --  7.1 9.6 7.5  NEUTROABS 6.9 5.4  --  6.1  --  5.9  HGB 7.5* 7.2* 7.5* 6.9* 8.7* 8.1*  HCT 23.3* 23.5* 22.0* 23.0* 28.1* 27.3*  MCV 91.7 97.5  --  100.0 97.2 96.5  PLT 101* 98*  --  101* 102* 427*   Basic Metabolic Panel: Recent Labs  Lab 03/24/19 1730 03/25/19 0558 03/25/19 0811 03/25/19 1130 03/26/19 0342 03/26/19 0844 03/27/19 0414 03/28/19 0323  NA 143 144  --   --  141 144 143 147*  K 2.6* 2.9*  --   --  4.3 4.0 3.7 3.1*  CL 113* 105  --   --  108  --  109 107  CO2 16* 26  --   --  24  --  26 29  GLUCOSE 86 112*  --   --  274*  --  129* 104*  BUN 28* 26*  --   --  23  --  17 16  CREATININE 1.98* 1.97*  --   --  1.72*  --  1.50* 1.58*   CALCIUM 6.0* 5.8*  --   --  6.3*  --  7.1* 7.2*  MG  --   --  1.2*  --  1.4*  --  2.2 2.0  PHOS  --   --   --  3.4  --   --  3.4  --    GFR: Estimated Creatinine Clearance: 24.8 mL/min (A) (by C-G formula based on SCr of 1.58 mg/dL (H)). Liver Function Tests: Recent Labs  Lab 03/24/19 1730 03/25/19 0558 03/26/19 0342 03/27/19 0414 03/28/19 0323  AST 32 35 29 19 14*  ALT 6 8 7 9 8   ALKPHOS 69 62 55 50 48  BILITOT 0.5 0.4 0.4 0.3 0.6  PROT 4.2* 4.2* 4.3* 4.5* 4.6*  ALBUMIN 1.9* 1.7* 1.8* 1.8* 1.8*   No results for input(s): LIPASE, AMYLASE in the last 168 hours. No results for input(s): AMMONIA in the last 168 hours. Coagulation Profile: No results for input(s): INR, PROTIME in the last 168 hours. Cardiac Enzymes: Recent Labs  Lab 03/24/19 1730 03/26/19 0342 03/26/19 1011 03/26/19 1805 03/26/19 2202  TROPONINI <0.03 <0.03 <0.03 <0.03 <0.03   BNP (last 3 results) No results for input(s): PROBNP in the last 8760 hours. HbA1C: No results for input(s): HGBA1C in the last 72 hours. CBG: Recent Labs  Lab 03/27/19 1241 03/27/19 1614 03/27/19 2134 03/28/19 0723 03/28/19 1158  GLUCAP 89 86 114* 85 127*   Lipid Profile: No results for input(s): CHOL, HDL, LDLCALC, TRIG, CHOLHDL, LDLDIRECT in the last 72 hours. Thyroid Function Tests: No results for input(s): TSH, T4TOTAL, FREET4, T3FREE, THYROIDAB in the last 72 hours. Anemia Panel: Recent Labs    03/27/19 0817  VITAMINB12 215  FOLATE 10.2  FERRITIN 80  TIBC NOT CALCULATED  IRON 33  RETICCTPCT 1.9   Urine analysis:    Component Value Date/Time   COLORURINE YELLOW 03/24/2019 1400   APPEARANCEUR CLOUDY (A) 03/24/2019 1400   LABSPEC 1.019 03/24/2019 1400   PHURINE 5.0 03/24/2019 1400   GLUCOSEU 150 (A) 03/24/2019 1400   HGBUR MODERATE (A) 03/24/2019 1400   BILIRUBINUR NEGATIVE 03/24/2019 1400   KETONESUR NEGATIVE 03/24/2019 1400   PROTEINUR 30 (A) 03/24/2019 1400   UROBILINOGEN 0.2 01/24/2014 0500    NITRITE NEGATIVE 03/24/2019 1400  LEUKOCYTESUR LARGE (A) 03/24/2019 1400   Sepsis Labs: @LABRCNTIP (procalcitonin:4,lacticidven:4)  ) Recent Results (from the past 240 hour(s))  SARS Coronavirus 2 [facility] order, Performed in [place] hospital lab)     Status: None   Collection Time: 03/24/19  2:09 PM  Result Value Ref Range Status   SARS Coronavirus 2 NEGATIVE NEGATIVE Final    Comment: (NOTE) If result is NEGATIVE SARS-CoV-2 target nucleic acids are NOT DETECTED. The SARS-CoV-2 RNA is generally detectable in upper and lower  respiratory specimens during the acute phase of infection. The lowest  concentration of SARS-CoV-2 viral copies this assay can detect is 250  copies / mL. A negative result does not preclude SARS-CoV-2 infection  and should not be used as the sole basis for treatment or other  patient management decisions.  A negative result may occur with  improper specimen collection / handling, submission of specimen other  than nasopharyngeal swab, presence of viral mutation(s) within the  areas targeted by this assay, and inadequate number of viral copies  (<250 copies / mL). A negative result must be combined with clinical  observations, patient history, and epidemiological information. If result is POSITIVE SARS-CoV-2 target nucleic acids are DETECTED. The SARS-CoV-2 RNA is generally detectable in upper and lower  respiratory specimens dur ing the acute phase of infection.  Positive  results are indicative of active infection with SARS-CoV-2.  Clinical  correlation with patient history and other diagnostic information is  necessary to determine patient infection status.  Positive results do  not rule out bacterial infection or co-infection with other viruses. If result is PRESUMPTIVE POSTIVE SARS-CoV-2 nucleic acids MAY BE PRESENT.   A presumptive positive result was obtained on the submitted specimen  and confirmed on repeat testing.  While 2019 novel  coronavirus  (SARS-CoV-2) nucleic acids may be present in the submitted sample  additional confirmatory testing may be necessary for epidemiological  and / or clinical management purposes  to differentiate between  SARS-CoV-2 and other Sarbecovirus currently known to infect humans.  If clinically indicated additional testing with an alternate test  methodology [id number]) is advised. The SARS-CoV-2 RNA is generally  detectable in upper and lower respiratory sp ecimens during the acute  phase of infection. The expected result is Negative. Fact Sheet for Patients:  StrictlyIdeas.no Fact Sheet for Healthcare Providers: BankingDealers.co.za This test is not yet approved or cleared by the [place] FDA and has been authorized for detection and/or diagnosis of SARS-CoV-2 by FDA under an Emergency Use Authorization (EUA).  This EUA will remain in effect (meaning this test can be used) for the duration of the COVID-19 declaration under Section 564(b)(1) of the Act, 21 U.S.C. section 360bbb-3(b)(1), unless the authorization is terminated or revoked sooner. Performed at [facility], [place] [address] [place],  [address]   Novel Coronavirus, NAA (hospital order; send-out to ref lab)     Status: None   Collection Time: 03/24/19  2:09 PM  Result Value Ref Range Status   SARS-CoV-2, NAA NOT DETECTED NOT DETECTED Final    Comment: (NOTE) This test was developed and its performance characteristics determined by [employer]. This test has not been FDA cleared or approved. This test has been authorized by FDA under an Emergency Use Authorization (EUA). This test has been validated in accordance with the FDA's Guidance Document Adult nurse for [place] in Laboratories Certified to Perform High Complexity Testing under CLIA prior to Emergency Use Authorization for Coronavirus [id number] during the [place]  Emergency)  issued on February 29th, 2020. FDA independent review of this validation is pending. This test is only authorized for the duration of time the declaration that circumstances exist justifying the authorization of the emergency use of in vitro diagnostic tests for detection of SARS-CoV- 2 virus and/or diagnosis of COVID-19 infection under section 564(b)(1) of the Act, 21 U.S.C. 270JJK-0(X)(3), unless the authorization is terminated or revoked sooner. Performed At: [facility] [address] [place], [place] [id number] Rush Farmer MD IR:[id number]    [place]  Final    Comment: Performed at [facility], [place] [address], [place], [place] [address]  Culture, blood (Routine X 2) w Reflex to ID Panel     Status: None (Preliminary result)   Collection Time: 03/24/19  3:35 PM  Result Value Ref Range Status   Specimen Description BLOOD RIGHT PICC LINE  Final   Special Requests   Final    BOTTLES DRAWN AEROBIC ONLY Blood Culture adequate volume   Culture   Final    NO GROWTH 4 DAYS Performed at [facility], [place] [address] [place], [place] [address]    Report Status PENDING  Incomplete  Culture, blood (Routine X 2) w Reflex to ID Panel     Status: None (Preliminary result)   Collection Time: 03/24/19  4:25 PM  Result Value Ref Range Status   Specimen Description BLOOD RIGHT HAND  Final   Special Requests   Final    BOTTLES DRAWN AEROBIC ONLY Blood Culture results may not be optimal due to an inadequate volume of blood received in culture bottles   Culture   Final    NO GROWTH 4 DAYS Performed at [facility], [place] [address], [place], [place] [address]    Report Status PENDING  Incomplete  MRSA PCR Screening     Status: None   Collection Time: 03/25/19  6:22 PM  Result Value Ref Range Status   MRSA by PCR NEGATIVE NEGATIVE Final    Comment:        The GeneXpert MRSA Assay (FDA approved for NASAL specimens only), is one component  of a comprehensive MRSA colonization surveillance program. It is not intended to diagnose MRSA infection nor to guide or monitor treatment for MRSA infections. Performed at [facility], [place] [address], [place], [place] [address]   Respiratory Panel by PCR     Status: None   Collection Time: 03/26/19  8:36 AM  Result Value Ref Range Status   Adenovirus NOT DETECTED NOT DETECTED Final   Coronavirus 229E NOT DETECTED NOT DETECTED Final    Comment: (NOTE) The Coronavirus on the Respiratory Panel, DOES NOT test for the novel  Coronavirus (2019 nCoV)    Coronavirus HKU1 NOT DETECTED NOT DETECTED Final   Coronavirus NL63 NOT DETECTED NOT DETECTED Final   Coronavirus OC43 NOT DETECTED NOT DETECTED Final   Metapneumovirus NOT DETECTED NOT DETECTED Final   Rhinovirus / Enterovirus NOT DETECTED NOT DETECTED Final   Influenza A NOT DETECTED NOT DETECTED Final   Influenza B NOT DETECTED NOT DETECTED Final   Parainfluenza Virus 1 NOT DETECTED NOT DETECTED Final   Parainfluenza Virus 2 NOT DETECTED NOT DETECTED Final   Parainfluenza Virus 3 NOT DETECTED NOT DETECTED Final   Parainfluenza Virus 4 NOT DETECTED NOT DETECTED Final   Respiratory Syncytial Virus NOT DETECTED NOT DETECTED Final   Bordetella pertussis NOT DETECTED NOT DETECTED Final   Chlamydophila pneumoniae NOT  DETECTED NOT DETECTED Final   Mycoplasma pneumoniae NOT DETECTED NOT DETECTED Final    Comment: Performed at [facility], [place] [address], [place], [place] [address]         Radiology Studies: Ct Chest Wo Contrast  Result Date: 03/28/2019 CLINICAL DATA:  Short of breath.  Heart failure. EXAM: CT CHEST WITHOUT CONTRAST TECHNIQUE: Multidetector CT imaging of the chest was performed following the standard protocol without IV contrast. COMPARISON:  None. FINDINGS: Cardiovascular: PICC line with tip in distal SVC. Minimal coronary calcifications. No pericardial effusion. Atherosclerotic calcification of the  aortic arch. Heart size normal. Mediastinum/Nodes: No axillary or supraclavicular adenopathy. No mediastinal hilar adenopathy. Normal esophagus Lungs/Pleura: Diffuse confluent ground-glass opacities in the upper lobes. Ground-glass opacities superimposed on centrilobular emphysema. Moderate bilateral layering pleural effusions. Bibasilar mild passive atelectasis in lower lobes. No focal consolidation.  No pneumothorax.  No suspicious nodularity. Upper Abdomen: Limited view of the liver, kidneys, pancreas are unremarkable. Normal adrenal glands. Musculoskeletal: No aggressive osseous lesion. IMPRESSION: 1. Diffuse ground-glass opacities suggest pulmonary edema. 2. Bilateral pleural effusions. 3. Normal heart size. 4. Coronary artery calcification and Aortic Atherosclerosis (ICD10-I70.0). Electronically Signed   By: Suzy Bouchard M.D.   On: 03/28/2019 11:24   Dg Chest Port 1 View  Result Date: 03/28/2019 CLINICAL DATA:  Shortness of breath. EXAM: PORTABLE CHEST 1 VIEW COMPARISON:  03/27/2019 FINDINGS: Right PICC is unchanged. The cardiomediastinal silhouette is unchanged with normal heart size. Aortic atherosclerosis is noted. The lungs remain hyperinflated related underlying COPD. Mixed interstitial and airspace opacities are again seen relatively diffusely throughout both lungs, stable to slightly worsened in the lung bases and stable to minimally increased in the upper lungs. No large pleural effusion or pneumothorax is identified. Old bilateral rib fractures are again noted. IMPRESSION: Mixed bilateral interstitial and airspace opacities superimposed on COPD without significant interval change. Electronically Signed   By: Logan Bores M.D.   On: 03/28/2019 08:50   Dg Chest Port 1 View  Result Date: 03/27/2019 CLINICAL DATA:  Pulmonary edema. EXAM: PORTABLE CHEST 1 VIEW COMPARISON:  One-view chest x-ray 03/26/2019 FINDINGS: Heart size is normal. Atherosclerotic changes are noted at the aorta. Diffuse  interstitial and airspace disease is superimposed on chronic COPD. Aeration is slightly improved since yesterday's study. Remote right-sided rib fractures are again seen. IMPRESSION: 1. Slight improved aeration of both lungs. 2. Persistent diffuse interstitial and airspace disease superimposed on COPD. This likely reflects edema. Infection is not excluded. 3. Aortic atherosclerosis. Electronically Signed   By: San Morelle M.D.   On: 03/27/2019 07:46   Dg Abd Portable 1v  Result Date: 03/26/2019 CLINICAL DATA:  Lower abdominal pain and diarrhea.  Dementia. EXAM: PORTABLE ABDOMEN - 1 VIEW COMPARISON:  CT 03/24/2019 FINDINGS: Bowel gas pattern does not show evidence of ileus or obstruction. Double-J ureteral stent in the left kidney and within the transplanted ureter. Multiple surgical clips in the pelvis. Left iliac artery stent. Cholecystectomy clips. No acute bone finding. IMPRESSION: No acute radiographic finding. Electronically Signed   By: Nelson Chimes M.D.   On: 03/26/2019 15:58        Scheduled Meds: . docusate sodium  100 mg Oral BID  . donepezil  10 mg Oral QHS  . doxazosin  2 mg Oral Daily  . enoxaparin (LOVENOX) injection  30 mg Subcutaneous q morning - 10a  . furosemide  60 mg Intravenous Once  . insulin aspart  0-5 Units Subcutaneous QHS  . insulin aspart  0-9  Units Subcutaneous TID WC  . ipratropium-albuterol  3 mL Nebulization Q6H  . magnesium oxide  400 mg Oral BID  . methylPREDNISolone (SOLU-MEDROL) injection  40 mg Intravenous Q12H  . mirtazapine  45 mg Oral QHS  . phenytoin (DILANTIN) IV  65 mg Intravenous Q8H  . potassium chloride  40 mEq Oral Once   Continuous Infusions: . dextrose 5 % with KCl 20 mEq / L 20 mEq (03/28/19 1159)  . levETIRAcetam 500 mg (03/28/19 1222)  . piperacillin-tazobactam (ZOSYN)  IV 3.375 g (03/28/19 1207)     LOS: 4 days   The patient is critically ill with multiple organ systems failure and requires high complexity decision making  for assessment and support, frequent evaluation and titration of therapies, application of advanced monitoring technologies and extensive interpretation of multiple databases. Critical Care Time devoted to patient care services described in this note  Time spent: 35 minutes  Signature  Lala Lund M.D on 03/28/2019 at 1:40 PM   -  To page go to [email or web address]

## 2019-03-28 NOTE — Progress Notes (Signed)
Daily Progress Note   Patient Name: [name]       Date: 03/28/2019 DOB: [birth year]  Age: 68 y.o. MRN#: [id number] Attending Physician: Thurnell Lose, MD Primary Care Physician: Marco Collie, MD Admit Date: 03/24/2019  Reason for Consultation/Follow-up: Establishing goals of care  Subjective: Patient in bed, awake, alert. "I'm ready to go home!". She denies pain. Reports feeling SOB and feeling like she has to cough- nonproductive cough. Per RN- episode of desatting overnight- required NRB d/t mouth breathing.   ROS  Length of Stay: 4  Current Medications: Scheduled Meds:  . docusate sodium  100 mg Oral BID  . donepezil  10 mg Oral QHS  . doxazosin  2 mg Oral Daily  . enoxaparin (LOVENOX) injection  30 mg Subcutaneous q morning - 10a  . insulin aspart  0-5 Units Subcutaneous QHS  . insulin aspart  0-9 Units Subcutaneous TID WC  . ipratropium-albuterol  3 mL Nebulization Q6H  . magnesium oxide  400 mg Oral BID  . methylPREDNISolone (SOLU-MEDROL) injection  40 mg Intravenous Q12H  . mirtazapine  45 mg Oral QHS  . phenytoin (DILANTIN) IV  65 mg Intravenous Q8H  . potassium chloride  40 mEq Oral Once    Continuous Infusions: . dextrose 5 % with KCl 20 mEq / L 20 mEq (03/28/19 1159)  . levETIRAcetam 500 mg (03/28/19 1222)  . piperacillin-tazobactam (ZOSYN)  IV 3.375 g (03/28/19 1207)    PRN Meds: acetaminophen, bisacodyl, morphine, [DISCONTINUED] ondansetron **OR** ondansetron (ZOFRAN) IV, polyethylene glycol, sodium chloride flush  Physical Exam Vitals signs and nursing note reviewed.  Skin:    Coloration: Skin is pale.  Neurological:     General: No focal deficit present.     Mental Status: She is alert.  Psychiatric:        Behavior: Behavior normal.     Vital Signs: BP (!) 164/73 (BP Location: Left Arm)   Pulse 84   Temp 97.6 F (36.4 C)   Resp 16   Ht 5' (1.524 m)   Wt 46.5 kg   SpO2 100%   BMI 20.02 kg/m  SpO2: SpO2: 100 % O2 Device: O2 Device: High Flow Nasal Cannula O2 Flow Rate: O2 Flow Rate (L/min): 15 L/min  Intake/output summary:   Intake/Output Summary (Last 24 hours) at 03/28/2019 1310  Last data filed at 03/28/2019 0900 Gross per 24 hour  Intake 56.51 ml  Output 1600 ml  Net -1543.49 ml   LBM: Last BM Date: 03/27/19 Baseline Weight: Weight: 45.7 kg Most recent weight: Weight: 46.5 kg       Palliative Assessment/Data: PPS: 50%      Patient Active Problem List   Diagnosis Date Noted  . NSAID overdose 03/27/2019  . Anemia, unspecified 03/27/2019  . Delirium 03/27/2019  . Palliative care by specialist   . Advanced care planning/counseling discussion   . Goals of care, counseling/discussion   . Acute renal failure superimposed on stage 3 chronic kidney disease ([place]) 03/26/2019  . Alzheimer's dementia ([place]) 03/26/2019  . COPD with acute exacerbation ([place]) 03/26/2019  . Acute on chronic respiratory failure with hypoxia ([place]) 03/24/2019  . Acute cholecystitis due to biliary calculus 10/23/2018  . Biliary colic 48/12/6551  . PAD (peripheral artery disease) ([place]) 06/06/2018  . Seizure disorder ([place]) 06/06/2018  . Chest pain 06/06/2018  . Diarrhea 06/06/2018  . HTN (hypertension) 06/06/2018  . HLD (hyperlipidemia) 06/06/2018  . Ventilator dependent ([place])   . Lactic acidosis   . Acute renal failure ([place])   . Recurrent sepsis due to urinary tract infection ([place])   . Iliac artery stenosis, left ([place]) 05/14/2014  . Swollen feet 02/01/2014  . Pain in limb-Left Great toe, 4th and  5th digit 02/01/2014  . Thromboembolism of lower extremity artery ([place]) 02/01/2014  . UTI (urinary tract infection) due to Enterococcus 01/20/2014  . Acute blood loss anemia 01/20/2014  . Protein-calorie malnutrition, severe ([place])  01/18/2014  . Critical lower limb ischemia 01/17/2014  . Subacute delirium 10/14/2013  . DYSPHAGIA UNSPECIFIED 10/28/2008  . HYPOVOLEMIA 10/25/2008  . 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 [place] DISEASE 10/25/2008  . COPD (chronic obstructive pulmonary disease) ([place]) 10/25/2008  . ESOPHAGEAL STRICTURE 10/25/2008  . GERD 10/25/2008  . HIATAL HERNIA 10/25/2008  . SMALL BOWEL OBSTRUCTION 10/25/2008  . IBS 10/25/2008  . OTHER RETROPERITONEAL ABSCESS 10/25/2008  . Blind loop syndrome 10/25/2008  . OTHER AND UNSPECIFIED POSTSURGICAL NONABSORPTION 10/25/2008  . PYELONEPHRITIS 10/25/2008  . RENAL CALCULUS 10/25/2008  . INTERSTITIAL CYSTITIS 10/25/2008  . ENDOMETRIOSIS 10/25/2008  . PSORIASIS 10/25/2008  . HEADACHE, CHRONIC 10/25/2008  . COLONIC POLYPS, HX OF 10/25/2008    Palliative Care Assessment & Plan   Patient Profile: 68 y.o. female  with past medical history of dementia, seizures, CVA, R hemicolectomy, HTN, COPD (not on home O2, on single med), neobladder, hx pancreatic stent placement 10/2018 admitted on 03/24/2019 with abdominal pain and SOB. Workup revealed COPD exacerbation, salicylate overdose, COVID negative, acute on CKD III, hypoalbuminemia (Albumin 1.9 on admission), anemia, palliative medicine consulted for [place].    Assessment/Recommendations/Plan   Will order sublingual morphine 2.5mg  for SOB  PMT will monitor for decline or progression, GOC for now are to continue to attempt to stabilize and d/c home with patient's current 24 hour care and outpatient Palliative  Goals of Care and Additional Recommendations:  Limitations on Scope of Treatment: Full Scope Treatment  Code Status:  DNR  Prognosis:   Unable to determine  Discharge Planning:  Home with [place] was discussed with patient and patient's RN.  Thank you for allowing the Palliative Medicine Team to assist in the care of this patient.   Time In: 1200 Time Out: 1235 Total Time  35 minutes Prolonged Time Billed no      Greater than 50%  of this time  was spent counseling and coordinating care related to the above assessment and plan.  Mariana Kaufman, AGNP-C Palliative Medicine   Please contact Palliative Medicine Team phone at [phone] for questions and concerns.

## 2019-03-29 LAB — CBC WITH DIFFERENTIAL/PLATELET
Abs Immature Granulocytes: 0.06 10*3/uL (ref 0.00–0.07)
Basophils Absolute: 0 10*3/uL (ref 0.0–0.1)
Basophils Relative: 0 %
Eosinophils Absolute: 0.1 10*3/uL (ref 0.0–0.5)
Eosinophils Relative: 2 %
HCT: 28.6 % — ABNORMAL LOW (ref 36.0–46.0)
Hemoglobin: 8.6 g/dL — ABNORMAL LOW (ref 12.0–15.0)
Immature Granulocytes: 1 %
Lymphocytes Relative: 23 %
Lymphs Abs: 1.9 10*3/uL (ref 0.7–4.0)
MCH: 29.9 pg (ref 26.0–34.0)
MCHC: 30.1 g/dL (ref 30.0–36.0)
MCV: 99.3 fL (ref 80.0–100.0)
Monocytes Absolute: 0.6 10*3/uL (ref 0.1–1.0)
Monocytes Relative: 8 %
Neutro Abs: 5.4 10*3/uL (ref 1.7–7.7)
Neutrophils Relative %: 66 %
Platelets: 103 10*3/uL — ABNORMAL LOW (ref 150–400)
RBC: 2.88 MIL/uL — ABNORMAL LOW (ref 3.87–5.11)
RDW: 15.3 % (ref 11.5–15.5)
WBC: 8.1 10*3/uL (ref 4.0–10.5)
nRBC: 0 % (ref 0.0–0.2)

## 2019-03-29 LAB — CULTURE, BLOOD (ROUTINE X 2)
Culture: NO GROWTH
Culture: NO GROWTH
Special Requests: ADEQUATE

## 2019-03-29 LAB — COMPREHENSIVE METABOLIC PANEL
ALT: 9 U/L (ref 0–44)
AST: 13 U/L — ABNORMAL LOW (ref 15–41)
Albumin: 1.8 g/dL — ABNORMAL LOW (ref 3.5–5.0)
Alkaline Phosphatase: 44 U/L (ref 38–126)
Anion gap: 6 (ref 5–15)
BUN: 12 mg/dL (ref 8–23)
CO2: 30 mmol/L (ref 22–32)
Calcium: 6.9 mg/dL — ABNORMAL LOW (ref 8.9–10.3)
Chloride: 106 mmol/L (ref 98–111)
Creatinine, Ser: 1.45 mg/dL — ABNORMAL HIGH (ref 0.44–1.00)
GFR calc Af Amer: 43 mL/min — ABNORMAL LOW (ref >60)
GFR calc non Af Amer: 37 mL/min — ABNORMAL LOW (ref >60)
Glucose, Bld: 100 mg/dL — ABNORMAL HIGH (ref 70–99)
Potassium: 4.5 mmol/L (ref 3.5–5.1)
Sodium: 142 mmol/L (ref 135–145)
Total Bilirubin: 0.6 mg/dL (ref 0.3–1.2)
Total Protein: 4.6 g/dL — ABNORMAL LOW (ref 6.5–8.1)

## 2019-03-29 LAB — GLUCOSE, CAPILLARY
Glucose-Capillary: 87 mg/dL (ref 70–99)
Glucose-Capillary: 101 mg/dL — ABNORMAL HIGH (ref 70–99)
Glucose-Capillary: 106 mg/dL — ABNORMAL HIGH (ref 70–99)
Glucose-Capillary: 88 mg/dL (ref 70–99)

## 2019-03-29 LAB — MAGNESIUM: Magnesium: 1.8 mg/dL (ref 1.7–2.4)

## 2019-03-29 MED ORDER — FUROSEMIDE 10 MG/ML IJ SOLN
40.0000 mg | Freq: Every day | INTRAMUSCULAR | Status: DC
  Administered 2019-03-29: 40 mg via INTRAVENOUS
  Filled 2019-03-29: qty 4

## 2019-03-29 MED ORDER — SPIRONOLACTONE 25 MG PO TABS
25.0000 mg | ORAL_TABLET | Freq: Every day | ORAL | Status: DC
  Administered 2019-03-29 – 2019-03-30 (×2): 25 mg via ORAL
  Filled 2019-03-29 (×2): qty 1

## 2019-03-29 MED ORDER — MAGNESIUM SULFATE IN D5W 1-5 GM/100ML-% IV SOLN
1.0000 g | Freq: Once | INTRAVENOUS | Status: AC
  Administered 2019-03-29: 1 g via INTRAVENOUS
  Filled 2019-03-29: qty 100

## 2019-03-29 NOTE — Progress Notes (Signed)
Pharmacy consulted for phenytoin dosing in patient with a seizure disorder. Patient changed from home dose of 200mg  QHS to 65mg  IV q8h due to inability to tolerate Po therapy.   Per MD note, patient to change back to PO therapy once more cooperative with Po therapies.   Will await further orders for conversion to PO  Liadan Guizar A. Levada Dy, PharmD, [place] Pager: [phone] Please utilize [facility] for appropriate phone number to reach the unit pharmacist ([place])

## 2019-03-29 NOTE — Evaluation (Signed)
Physical Therapy Evaluation Patient Details Name: [name] MRN: [id number] DOB: 1950/12/13 Today's Date: 03/29/2019   History of Present Illness  68 y.o. female presenting with abdominal pain and SOB. 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  Clinical Impression  Orders received for PT evaluation. Patient demonstrates deficits in functional mobility as indicated below. Will benefit from continued skilled PT to address deficits and maximize function. Will see as indicated and progress as tolerated.  Anticipate patient will need 24/7 supervision and HHPT upon acute discharge.     Follow Up Recommendations Home health PT;Supervision/Assistance - 24 hour    Equipment Recommendations  None recommended by PT    Recommendations for Other Services       Precautions / Restrictions Precautions Precautions: Fall Precaution Comments: watch O2 saturations      Mobility  Bed Mobility               General bed mobility comments: recieved in chair  Transfers Overall transfer level: Needs assistance Equipment used: Rolling walker (2 wheeled) Transfers: Sit to/from Stand Sit to Stand: Min guard         General transfer comment: min guard for safety and stablity, no physical assist required  Ambulation/Gait Ambulation/Gait assistance: Min assist Gait Distance (Feet): 36 Feet Assistive device: Rolling walker (2 wheeled) Gait Pattern/deviations: Step-through pattern;Decreased stride length;Shuffle;Narrow base of support;Trunk flexed Gait velocity: decreased Gait velocity interpretation: <1.31 ft/sec, indicative of household ambulator General  Gait Details: Patient limited to in-room ambulation due to droplet precautions and fatigue. Ambulated on room air with saturations dropping to 86%, Vcs for pursed lip breathing, reapplied O2 with improvements to 91%  Stairs            Wheelchair Mobility    Modified Rankin (Stroke Patients Only)       Balance Overall balance assessment: Needs assistance   Sitting balance-Leahy Scale: Fair     Standing balance support: During functional activity Standing balance-Leahy Scale: Poor Standing balance comment: reliance on UE support and assist                             Pertinent Vitals/Pain Pain Assessment: Faces Faces Pain Scale: Hurts even more Pain Location: left flank pain Pain Descriptors / Indicators: Moaning;Grimacing;Guarding Pain Intervention(s): Monitored during session    Home Living Family/patient expects to be discharged to:: Private residence Living Arrangements: Children Available Help at Discharge: Family;Available 24 hours/day Type of Home: House Home Access: Stairs to enter     Home Layout: One level Home Equipment: Cane - single point;Walker - 4 wheels;Shower seat Additional Comments: information obtained from chart review of previous admissions    Prior Function Level of Independence: Needs assistance               Hand Dominance   Dominant Hand: Right    Extremity/Trunk Assessment   Upper Extremity Assessment Upper Extremity Assessment: Generalized weakness    Lower Extremity Assessment Lower Extremity Assessment: Generalized weakness    Cervical / Trunk Assessment Cervical / Trunk Assessment: Kyphotic  Communication   Communication: HOH  Cognition Arousal/Alertness: Awake/alert Behavior During Therapy: WFL for tasks assessed/performed Overall Cognitive Status:  History of cognitive impairments - at baseline                                        General Comments      Exercises      Assessment/Plan    PT Assessment Patient needs continued PT services  PT Problem List Decreased strength;Decreased activity tolerance;Decreased balance;Decreased mobility;Decreased safety awareness;Pain       PT Treatment Interventions DME instruction;Gait training;Stair training;Functional mobility training;Therapeutic activities;Therapeutic exercise;Balance training;Neuromuscular re-education;Cognitive remediation;Patient/family education    PT Goals (Current goals can be found in the Care Plan section)  Acute Rehab PT Goals Patient Stated Goal: to not hurt PT Goal Formulation: With patient Time For Goal Achievement: 04/12/19 Potential to Achieve Goals: Good    Frequency Min 3X/week   Barriers to discharge        Co-evaluation               AM-PAC PT "6 Clicks" Mobility  Outcome Measure Help needed turning from your back to your side while in a flat bed without using bedrails?: A Little Help needed moving from lying on your back to sitting on the side of a flat bed without using bedrails?: A Little Help needed moving to and from a bed to a chair (including a wheelchair)?: A Little Help needed standing up from a chair using your arms (e.g., wheelchair or bedside chair)?: A Little Help needed to walk in hospital room?: A Little Help needed climbing 3-5 steps with a railing? : A Lot 6 Click Score: 17    End of Session Equipment Utilized During Treatment: Gait belt;Oxygen Activity Tolerance: Patient limited by fatigue;Patient limited by pain Patient left: in chair;with call bell/phone within reach;with chair alarm set Nurse Communication: Mobility status PT Visit Diagnosis: Unsteadiness on feet (R26.81);Difficulty in walking, not elsewhere classified (R26.2);Other symptoms and signs involving the nervous system (R29.898);Muscle weakness (generalized) (M62.81)    Time: 9509-3267 PT Time Calculation (min) (ACUTE ONLY): 17 min   Charges:   PT Evaluation $PT Eval  Moderate Complexity: 1 Mod          Alben Deeds, PT DPT  Board Certified Neurologic Specialist Acute Rehabilitation Services Pager [phone] Office [place] 03/29/2019, 1:35 PM

## 2019-03-29 NOTE — Progress Notes (Signed)
PROGRESS NOTE    [name]  RAQ:[id number] DOB: Aug 31, 1951 DOA: 03/24/2019 PCP: Marco Collie, MD   Brief Narrative:   68 y.o. 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  She is unable to provide history.  Was transferred from [facility] ER with diagnosis of UTI, possible URI and hypoxic respiratory failure along with aspirin overdose.  COVID-19 was ruled out here.  She was transferred to my service on 03/28/2019 on day 5 of her hospital stay in fulminant acute hypoxic respiratory failure requiring nonrebreather mask.  Advanced dementia patient unable to give proper history or review of systems.  She is apparently DNR.     Subjective: In bed confused but appears to be in mild respiratory distress, unreliable historian, unable to answer questions or follow commands reliably.    Assessment & Plan:   NSAID overdose - Most likely unintentional given patient's mental status. - On admission treated with sodium bicarb, clinically resolved  Acute hypoxemic respiratory failure due to acute on chronic diastolic CHF with EF 45% along with possible viral pneumonia.  Was tested and ruled out for COVID-19 infection x2, CT chest noted, d-dimer and CRP were elevated but normal ferritin.  BNP was sky high.  She finally responded to high-dose IV Lasix and now acute hypoxic respiratory failure is much improved, she is down to 4 L nasal cannula oxygen from nonrebreather.  Continue diuresis.  Case was also discussed with ID physician Dr. Harlow Mares on 03/28/2019.  Agrees with the plan.  Encouraged to sit up, flutter valve added for  pulmonary toiletry.   Chronic obstructive pulmonary disease -We will no wheezing.  Supportive care.   Alzheimer's dementia/Hx CVA/hospital delirium? -Continue home dementia medications.  At risk for delirium.  Seizure disorder -Keppra 500 mg twice daily - Phenytoin 65 mg 3 times daily -Change medications over to PO as soon as patient more cooperative.   Acute on CKD stage III   Avoid nephrotoxins with gentle hydration renal function improving.  Appears to be close to 1.5 - 1.7.  Essential HTN -Currently controlled without medication.  Monitor closely   Abdominal pain - KUB negative for ileus or obstruction - N.p.o.  Anemia unspecified - Anemia of chronic disease status post 1 unit of transfusion on 03/27/2019.  Stable H&H continue to monitor.  Goals of care - 4/16 palliative care consult: Demented patient with multiple medical problems consider DNR status change        DVT prophylaxis: Lovenox Code Status: Full Family Communication: None Disposition Plan: Daily   Consultants:  Nephrology, palliative care    Procedures/Significant Events:  4/15 PCXR:-Similar appearance of mixed interstitial and airspace opacities, with rapid worsening in 24-48 hours. Differential includes edema, multifocal infection, drug reaction, acute interstitial pneumonitis. 4/16 KUB: Negative for ileus 4/17 PCXR:-Slight improved aeration of both lungs.  Persistent diffuse interstitial and airspace disease superimposed on COPD. This likely reflects edema. Infection is not excluded.  TTE 05/2018  - Left ventricle: The cavity size was normal. Wall thickness was normal. Systolic function was normal. The estimated ejection fraction was in the range of 60% to 65%. Wall motion was normal; there were no regional wall motion  abnormalities. Doppler parameters are consistent with abnormal left ventricular  relaxation (grade 1 diastolic dysfunction). - Aortic valve: There was mild regurgitation. Valve  area (VTI):   1.99 cm^2. Valve area (Vmax): 1.97 cm^2. Valve area (Vmean): 1.76  cm^2. - Mitral valve: There was mild regurgitation.    I have personally reviewed and interpreted all radiology studies and my findings are as above.  VENTILATOR SETTINGS: None   Cultures 4/14 SARS coronavirus negative 4/14 COVUID-19 send out negative 4/16 respiratory virus panel negative    Antimicrobials: Anti-infectives (From admission, onward)   Start     Ordered Stop   03/26/19 1000  hydroxychloroquine (PLAQUENIL) tablet 200 mg  Status:  Discontinued     03/25/19 1140 03/26/19 1226   03/26/19 0800  vancomycin (VANCOCIN) 500 mg in sodium chloride 0.9 % 100 mL IVPB  Status:  Discontinued     03/25/19 0859 03/26/19 0912   03/25/19 1200  azithromycin (ZITHROMAX) 500 mg in sodium chloride 0.9 % 250 mL IVPB  Status:  Discontinued     03/25/19 1118 03/25/19 1303   03/25/19 1145  hydroxychloroquine (PLAQUENIL) tablet 400 mg     03/25/19 1140 03/25/19 2247   03/25/19 1130  hydroxychloroquine (PLAQUENIL) tablet 200 mg  Status:  Discontinued     03/25/19 1118 03/25/19 1140   03/25/19 0000  piperacillin-tazobactam (ZOSYN) IVPB 3.375 g     03/24/19 2014     03/24/19 2013  vancomycin variable dose per unstable renal function (pharmacist dosing)  Status:  Discontinued     03/24/19 2013 03/25/19 0859   03/24/19 1800  piperacillin-tazobactam (ZOSYN) IVPB 3.375 g     03/24/19 1724 03/24/19 2020   03/24/19 1800  vancomycin (VANCOCIN) 1,000 mg in sodium chloride 0.9 % 250 mL IVPB     03/24/19 1724 03/24/19 2114       Devices    LINES / TUBES:      Continuous Infusions: . levETIRAcetam 500 mg (03/29/19 0905)  . magnesium sulfate 1 - 4 g bolus IVPB       Objective: Vitals:   03/28/19 1648 03/28/19 2153 03/29/19 0520 03/29/19 0908  BP: (!) 164/71 (!) 163/72 (!) 175/72 (!) 173/68  Pulse: 78 79 80 76  Resp: 20 18 18 16   Temp: 98.1 F (36.7 C) 98.3 F (36.8 C) 98.2 F (36.8 C) 98.4 F (36.9  C)  TempSrc: Oral Oral Oral Oral  SpO2: 100% 100% 100% 100%  Weight:      Height:        Intake/Output Summary (Last 24 hours) at 03/29/2019 1030 Last data filed at 03/28/2019 2319 Gross per 24 hour  Intake 610 ml  Output 2500 ml  Net -1890 ml   Filed Weights   03/26/19 0100 03/27/19 0500 03/27/19 1056  Weight: 44.9 kg 45.4 kg 46.5 kg   Exam  Awake Alert, Oriented X 3, No new F.N deficits, Normal affect [place].AT,PERRAL Supple Neck,No JVD, No cervical lymphadenopathy appriciated.  Symmetrical Chest wall movement, Good air movement bilaterally, few rales RRR,No Gallops, Rubs or new Murmurs, No Parasternal Heave +ve B.Sounds, Abd Soft, No tenderness, No organomegaly appriciated, No rebound - guarding or rigidity. No Cyanosis, Clubbing or edema, No new Rash or bruise   Data Reviewed: Care during the described time interval was provided by me .  I have reviewed this patient's available data, including medical history, events of note, physical examination, and all test results as part of my evaluation.   CBC: Recent Labs  Lab 03/26/19 0342  03/27/19 0414 03/27/19 1456 03/28/19 0323 03/28/19 1332 03/29/19 0406  WBC 6.2  --  7.1 9.6 7.5 7.1 8.1  NEUTROABS 5.4  --  6.1  --  5.9 5.4 5.4  HGB 7.2*   < > 6.9* 8.7* 8.1* 9.0* 8.6*  HCT 23.5*   < > 23.0* 28.1* 27.3* 29.5* 28.6*  MCV 97.5  --  100.0 97.2 96.5 96.7 99.3  PLT 98*  --  101* 102* 106* 97* 103*   < > = values in this interval not displayed.   Basic Metabolic Panel: Recent Labs  Lab 03/25/19 0558 03/25/19 0811 03/25/19 1130 03/26/19 0342 03/26/19 0844 03/27/19 0414 03/28/19 0323 03/29/19 0406  NA 144  --   --  141 144 143 147* 142  K 2.9*  --   --  4.3 4.0 3.7 3.1* 4.5  CL 105  --   --  108  --  109 107 106  CO2 26  --   --  24  --  26 29 30   GLUCOSE 112*  --   --  274*  --  129* 104* 100*  BUN 26*  --   --  23  --  17 16 12   CREATININE 1.97*  --   --  1.72*  --  1.50* 1.58* 1.45*  CALCIUM 5.8*  --   --  6.3*   --  7.1* 7.2* 6.9*  MG  --  1.2*  --  1.4*  --  2.2 2.0 1.8  PHOS  --   --  3.4  --   --  3.4  --   --    GFR: Estimated Creatinine Clearance: 27 mL/min (A) (by C-G formula based on SCr of 1.45 mg/dL (H)). Liver Function Tests: Recent Labs  Lab 03/25/19 0558 03/26/19 0342 03/27/19 0414 03/28/19 0323 03/29/19 0406  AST 35 29 19 14* 13*  ALT 8 7 9 8 9   ALKPHOS 62 55 50 48 44  BILITOT 0.4 0.4 0.3 0.6 0.6  PROT 4.2* 4.3* 4.5* 4.6* 4.6*  ALBUMIN 1.7* 1.8* 1.8* 1.8* 1.8*   No results for input(s): LIPASE, AMYLASE in the last 168 hours. No results for input(s): AMMONIA in the last 168 hours. Coagulation Profile: No results for input(s): INR, PROTIME in the last 168 hours. Cardiac Enzymes: Recent Labs  Lab 03/24/19 1730 03/26/19 0342 03/26/19 1011 03/26/19 1805 03/26/19 2202  TROPONINI <0.03 <0.03 <0.03 <0.03 <0.03   BNP (last 3 results) No results for input(s): PROBNP in the last 8760 hours. HbA1C: No results for input(s): HGBA1C in the last 72 hours. CBG: Recent Labs  Lab 03/28/19 1158 03/28/19 1645 03/28/19 1720 03/28/19 2148 03/29/19 0804  GLUCAP 127* 62* 101* 126* 87   Lipid Profile: No results for input(s): CHOL, HDL, LDLCALC, TRIG, CHOLHDL, LDLDIRECT in the last 72 hours. Thyroid Function Tests: No results for input(s): TSH, T4TOTAL, FREET4, T3FREE, THYROIDAB in the last 72 hours. Anemia Panel: Recent Labs    03/27/19 0817 03/28/19 1332  VITAMINB12 215  --   FOLATE 10.2  --   FERRITIN 80 115  TIBC NOT CALCULATED  --   IRON 33  --   RETICCTPCT 1.9  --    Urine analysis:    Component Value Date/Time   COLORURINE YELLOW 03/24/2019 1400   APPEARANCEUR CLOUDY (A) 03/24/2019 1400   LABSPEC 1.019 03/24/2019 1400   PHURINE 5.0 03/24/2019 1400   GLUCOSEU 150 (A) 03/24/2019 1400   HGBUR MODERATE (A) 03/24/2019 1400   BILIRUBINUR NEGATIVE  03/24/2019 1400   KETONESUR NEGATIVE 03/24/2019 1400   PROTEINUR 30 (A) 03/24/2019 1400   UROBILINOGEN 0.2  01/24/2014 0500   NITRITE NEGATIVE 03/24/2019 1400   LEUKOCYTESUR LARGE (A) 03/24/2019 1400   Sepsis Labs: @LABRCNTIP (procalcitonin:4,lacticidven:4)  ) Recent Results (from the past 240 hour(s))  SARS Coronavirus 2 [facility] order, Performed in [place] hospital lab)     Status: None   Collection Time: 03/24/19  2:09 PM  Result Value Ref Range Status   SARS Coronavirus 2 NEGATIVE NEGATIVE Final    Comment: (NOTE) If result is NEGATIVE SARS-CoV-2 target nucleic acids are NOT DETECTED. The SARS-CoV-2 RNA is generally detectable in upper and lower  respiratory specimens during the acute phase of infection. The lowest  concentration of SARS-CoV-2 viral copies this assay can detect is 250  copies / mL. A negative result does not preclude SARS-CoV-2 infection  and should not be used as the sole basis for treatment or other  patient management decisions.  A negative result may occur with  improper specimen collection / handling, submission of specimen other  than nasopharyngeal swab, presence of viral mutation(s) within the  areas targeted by this assay, and inadequate number of viral copies  (<250 copies / mL). A negative result must be combined with clinical  observations, patient history, and epidemiological information. If result is POSITIVE SARS-CoV-2 target nucleic acids are DETECTED. The SARS-CoV-2 RNA is generally detectable in upper and lower  respiratory specimens dur ing the acute phase of infection.  Positive  results are indicative of active infection with SARS-CoV-2.  Clinical  correlation with patient history and other diagnostic information is  necessary to determine patient infection status.  Positive results do  not rule out bacterial infection or co-infection with other viruses. If result is PRESUMPTIVE POSTIVE SARS-CoV-2 nucleic acids MAY BE PRESENT.   A presumptive positive result was obtained on the submitted specimen  and confirmed on repeat testing.  While  2019 novel coronavirus  (SARS-CoV-2) nucleic acids may be present in the submitted sample  additional confirmatory testing may be necessary for epidemiological  and / or clinical management purposes  to differentiate between  SARS-CoV-2 and other Sarbecovirus currently known to infect humans.  If clinically indicated additional testing with an alternate test  methodology [phone]) is advised. The SARS-CoV-2 RNA is generally  detectable in upper and lower respiratory sp ecimens during the acute  phase of infection. The expected result is Negative. Fact Sheet for Patients:  StrictlyIdeas.no Fact Sheet for Healthcare Providers: BankingDealers.co.za This test is not yet approved or cleared by the [place] FDA and has been authorized for detection and/or diagnosis of SARS-CoV-2 by FDA under an Emergency Use Authorization (EUA).  This EUA will remain in effect (meaning this test can be used) for the duration of the COVID-19 declaration under Section 564(b)(1) of the Act, 21 U.S.C. section 360bbb-3(b)(1), unless the authorization is terminated or revoked sooner. Performed at [facility], [place] [address] [place], [place] [address]   Novel Coronavirus, NAA (hospital order; send-out to ref lab)     Status: None   Collection Time: 03/24/19  2:09 PM  Result Value Ref Range Status   SARS-CoV-2, NAA NOT DETECTED NOT DETECTED Final    Comment: (NOTE) This test was developed and its performance characteristics determined by [employer]. This test has not been FDA cleared or approved. This test has been authorized by FDA under an Emergency Use Authorization (EUA). This test has been validated in accordance with  the FDA's Guidance Document Adult nurse for Diagnostics Testing in Laboratories Certified to Perform High Complexity Testing under CLIA prior to Emergency Use Authorization for Coronavirus [id number] during the [facility]  Emergency) issued on February 29th, 2020. FDA independent review of this validation is pending. This test is only authorized for the duration of time the declaration that circumstances exist justifying the authorization of the emergency use of in vitro diagnostic tests for detection of SARS-CoV- 2 virus and/or diagnosis of COVID-19 infection under section 564(b)(1) of the Act, 21 U.S.C. 627OJJ-0(K)(9), unless the authorization is terminated or revoked sooner. Performed At: [facility] [address] [place], [place] [id number] Rush Farmer MD JI:[id number]    [place]  Final    Comment: Performed at [facility], [place] [address], [place], [place] [address]  Culture, blood (Routine X 2) w Reflex to ID Panel     Status: None   Collection Time: 03/24/19  3:35 PM  Result Value Ref Range Status   Specimen Description BLOOD RIGHT PICC LINE  Final   Special Requests   Final    BOTTLES DRAWN AEROBIC ONLY Blood Culture adequate volume   Culture   Final    NO GROWTH 5 DAYS Performed at [facility], [place] [address] [place], [place] [address]    Report Status 03/29/2019 FINAL  Final  Culture, blood (Routine X 2) w Reflex to ID Panel     Status: None   Collection Time: 03/24/19  4:25 PM  Result Value Ref Range Status   Specimen Description BLOOD RIGHT HAND  Final   Special Requests   Final    BOTTLES DRAWN AEROBIC ONLY Blood Culture results may not be optimal due to an inadequate volume of blood received in culture bottles   Culture   Final    NO GROWTH 5 DAYS Performed at [facility], [place] [address] [place], [place] [address]    Report Status 03/29/2019 FINAL  Final  MRSA PCR Screening     Status: None   Collection Time: 03/25/19  6:22 PM  Result Value Ref Range Status   MRSA by PCR NEGATIVE NEGATIVE Final    Comment:        The GeneXpert MRSA Assay (FDA approved for NASAL specimens only), is one component of a comprehensive  MRSA colonization surveillance program. It is not intended to diagnose MRSA infection nor to guide or monitor treatment for MRSA infections. Performed at [facility], [place] [address] [place],  [address]   Respiratory Panel by PCR     Status: None   Collection Time: 03/26/19  8:36 AM  Result Value Ref Range Status   Adenovirus NOT DETECTED NOT DETECTED Final   Coronavirus 229E NOT DETECTED NOT DETECTED Final    Comment: (NOTE) The Coronavirus on the Respiratory Panel, DOES NOT test for the novel  Coronavirus (2019 nCoV)    Coronavirus HKU1 NOT DETECTED NOT DETECTED Final   Coronavirus NL63 NOT DETECTED NOT DETECTED Final   Coronavirus OC43 NOT DETECTED NOT DETECTED Final   Metapneumovirus NOT DETECTED NOT DETECTED Final   Rhinovirus / Enterovirus NOT DETECTED NOT DETECTED Final   Influenza A NOT DETECTED NOT DETECTED Final   Influenza B NOT DETECTED NOT DETECTED Final   Parainfluenza Virus 1 NOT DETECTED NOT DETECTED Final   Parainfluenza Virus 2 NOT DETECTED NOT DETECTED Final   Parainfluenza Virus 3 NOT DETECTED NOT DETECTED Final   Parainfluenza Virus 4 NOT  DETECTED NOT DETECTED Final   Respiratory Syncytial Virus NOT DETECTED NOT DETECTED Final   Bordetella pertussis NOT DETECTED NOT DETECTED Final   Chlamydophila pneumoniae NOT DETECTED NOT DETECTED Final   Mycoplasma pneumoniae NOT DETECTED NOT DETECTED Final    Comment: Performed at [facility], [place] [address], [place], [place] [address]         Radiology Studies: Ct Chest Wo Contrast  Result Date: 03/28/2019 CLINICAL DATA:  Short of breath.  Heart failure. EXAM: CT CHEST WITHOUT CONTRAST TECHNIQUE: Multidetector CT imaging of the chest was performed following the standard protocol without IV contrast. COMPARISON:  None. FINDINGS: Cardiovascular: PICC line with tip in distal SVC. Minimal coronary calcifications. No pericardial effusion. Atherosclerotic calcification of the aortic arch. Heart size  normal. Mediastinum/Nodes: No axillary or supraclavicular adenopathy. No mediastinal hilar adenopathy. Normal esophagus Lungs/Pleura: Diffuse confluent ground-glass opacities in the upper lobes. Ground-glass opacities superimposed on centrilobular emphysema. Moderate bilateral layering pleural effusions. Bibasilar mild passive atelectasis in lower lobes. No focal consolidation.  No pneumothorax.  No suspicious nodularity. Upper Abdomen: Limited view of the liver, kidneys, pancreas are unremarkable. Normal adrenal glands. Musculoskeletal: No aggressive osseous lesion. IMPRESSION: 1. Diffuse ground-glass opacities suggest pulmonary edema. 2. Bilateral pleural effusions. 3. Normal heart size. 4. Coronary artery calcification and Aortic Atherosclerosis (ICD10-I70.0). Electronically Signed   By: Suzy Bouchard M.D.   On: 03/28/2019 11:24   Dg Chest Port 1 View  Result Date: 03/28/2019 CLINICAL DATA:  Shortness of breath. EXAM: PORTABLE CHEST 1 VIEW COMPARISON:  03/27/2019 FINDINGS: Right PICC is unchanged. The cardiomediastinal silhouette is unchanged with normal heart size. Aortic atherosclerosis is noted. The lungs remain hyperinflated related underlying COPD. Mixed interstitial and airspace opacities are again seen relatively diffusely throughout both lungs, stable to slightly worsened in the lung bases and stable to minimally increased in the upper lungs. No large pleural effusion or pneumothorax is identified. Old bilateral rib fractures are again noted. IMPRESSION: Mixed bilateral interstitial and airspace opacities superimposed on COPD without significant interval change. Electronically Signed   By: Logan Bores M.D.   On: 03/28/2019 08:50        Scheduled Meds: . carvedilol  6.25 mg Oral BID WC  . docusate sodium  100 mg Oral BID  . donepezil  10 mg Oral QHS  . doxazosin  2 mg Oral Daily  . enoxaparin (LOVENOX) injection  30 mg Subcutaneous q morning - 10a  . furosemide  40 mg Intravenous Daily   . insulin aspart  0-5 Units Subcutaneous QHS  . insulin aspart  0-9 Units Subcutaneous TID WC  . magnesium oxide  400 mg Oral BID  . mouth rinse  15 mL Mouth Rinse BID  . mirtazapine  45 mg Oral QHS  . nitroGLYCERIN  1 inch Topical Q6H  . phenytoin (DILANTIN) IV  65 mg Intravenous Q8H  . spironolactone  25 mg Oral Daily   Continuous Infusions: . levETIRAcetam 500 mg (03/29/19 0905)  . magnesium sulfate 1 - 4 g bolus IVPB       LOS: 5 days      Signature  Lala Lund M.D on 03/29/2019 at 10:30 AM   -  To page go to [email or web address]

## 2019-03-30 ENCOUNTER — Ambulatory Visit: Payer: Self-pay | Admitting: Pharmacist

## 2019-03-30 LAB — COMPREHENSIVE METABOLIC PANEL
ALT: 6 U/L (ref 0–44)
AST: 10 U/L — ABNORMAL LOW (ref 15–41)
Albumin: 1.8 g/dL — ABNORMAL LOW (ref 3.5–5.0)
Alkaline Phosphatase: 46 U/L (ref 38–126)
Anion gap: 3 — ABNORMAL LOW (ref 5–15)
BUN: 13 mg/dL (ref 8–23)
CO2: 33 mmol/L — ABNORMAL HIGH (ref 22–32)
Calcium: 7 mg/dL — ABNORMAL LOW (ref 8.9–10.3)
Chloride: 104 mmol/L (ref 98–111)
Creatinine, Ser: 1.23 mg/dL — ABNORMAL HIGH (ref 0.44–1.00)
GFR calc Af Amer: 53 mL/min — ABNORMAL LOW (ref >60)
GFR calc non Af Amer: 45 mL/min — ABNORMAL LOW (ref >60)
Glucose, Bld: 89 mg/dL (ref 70–99)
Potassium: 4.3 mmol/L (ref 3.5–5.1)
Sodium: 140 mmol/L (ref 135–145)
Total Bilirubin: 0.5 mg/dL (ref 0.3–1.2)
Total Protein: 4.4 g/dL — ABNORMAL LOW (ref 6.5–8.1)

## 2019-03-30 LAB — GLUCOSE, CAPILLARY
Glucose-Capillary: 85 mg/dL (ref 70–99)
Glucose-Capillary: 98 mg/dL (ref 70–99)
Glucose-Capillary: 114 mg/dL — ABNORMAL HIGH (ref 70–99)

## 2019-03-30 LAB — MAGNESIUM: Magnesium: 2.1 mg/dL (ref 1.7–2.4)

## 2019-03-30 MED ORDER — FUROSEMIDE 10 MG/ML IJ SOLN
60.0000 mg | Freq: Every day | INTRAMUSCULAR | Status: DC
  Administered 2019-03-30: 60 mg via INTRAVENOUS
  Filled 2019-03-30: qty 6

## 2019-03-30 MED ORDER — ISOSORBIDE MONONITRATE ER 60 MG PO TB24
60.0000 mg | ORAL_TABLET | Freq: Every day | ORAL | Status: DC
  Administered 2019-03-30: 60 mg via ORAL
  Filled 2019-03-30: qty 1

## 2019-03-30 MED ORDER — ISOSORBIDE MONONITRATE ER 30 MG PO TB24: 30.0000 mg | ORAL_TABLET | Freq: Every day | ORAL | 0 refills | Status: DC

## 2019-03-30 MED ORDER — SPIRONOLACTONE 25 MG PO TABS: 25.0000 mg | ORAL_TABLET | Freq: Every day | ORAL | 0 refills | Status: DC

## 2019-03-30 MED ORDER — FUROSEMIDE 40 MG PO TABS: 40.0000 mg | ORAL_TABLET | Freq: Every day | ORAL | 0 refills | Status: DC

## 2019-03-30 MED ORDER — HYDRALAZINE HCL 50 MG PO TABS: 50.0000 mg | ORAL_TABLET | Freq: Three times a day (TID) | ORAL | 0 refills | Status: DC

## 2019-03-30 NOTE — TOC Transition Note (Addendum)
Transition of Care [facility]) - CM/SW Discharge Note   Patient Details  Name: [name] MRN: [id number] Date of Birth: Mar 16, 1951  [facility] CM/SW Contact:  Sharin Mons, RN Phone Number: 03/30/2019, 10:35 AM   Clinical Narrative:    Transition to home today with the resumption of home health services [facility]). Active with [facility]. Awaiting walking pulmonary sats, nurse aware. Oxygen will be delivered to bedside prior to d/c once qualifying sats entered.  Pt states son to provide transportation to home.   Briony Parveen (Son)      [phone]        Final next level of care: Home w Home Health Services Barriers to Discharge: Barriers Resolved   Patient Goals and CMS Choice Patient states their goals for this hospitalization and ongoing recovery are:: i want to go home CMS Medicare.gov Compare Post Acute Care list provided to:: Patient Choice offered to / list presented to : Patient  Discharge Placement                       Discharge Plan and Services In-house Referral: NA Discharge Planning Services: CM Consult Post Acute Care Choice: Home Health          DME Arranged: Oxygen DME Agency: [facility] HH Arranged: RN, PT, Nurse's Aide, Speech Therapy, orders faxed to [phone] [facility] Agency: [facility]   Social Determinants of Health (SDOH) Interventions     Readmission Risk Interventions Readmission Risk Prevention Plan 03/27/2019  Transportation Screening Complete  Home Care Screening Complete  Some recent data might be hidden

## 2019-03-30 NOTE — Plan of Care (Signed)
  Problem: Education: Goal: Knowledge of General Education information will improve Description Including pain rating scale, medication(s)/side effects and non-pharmacologic comfort measures 03/30/2019 1704 by Dario Guardian, RN Outcome: Adequate for Discharge 03/30/2019 1011 by Dario Guardian, RN Outcome: Progressing   Problem: Clinical Measurements: Goal: Respiratory complications will improve 03/30/2019 1704 by Dario Guardian, RN Outcome: Adequate for Discharge 03/30/2019 1011 by Dario Guardian, RN Outcome: Progressing   Problem: Activity: Goal: Risk for activity intolerance will decrease 03/30/2019 1704 by Dario Guardian, RN Outcome: Adequate for Discharge 03/30/2019 1011 by Dario Guardian, RN Outcome: Progressing   Problem: Elimination: Goal: Will not experience complications related to bowel motility 03/30/2019 1704 by Dario Guardian, RN Outcome: Adequate for Discharge 03/30/2019 1011 by Dario Guardian, RN Outcome: Progressing Goal: Will not experience complications related to urinary retention 03/30/2019 1704 by Dario Guardian, RN Outcome: Adequate for Discharge 03/30/2019 1011 by Dario Guardian, RN Outcome: Progressing   Problem: Pain Managment: Goal: General experience of comfort will improve 03/30/2019 1704 by Dario Guardian, RN Outcome: Adequate for Discharge 03/30/2019 1011 by Dario Guardian, RN Outcome: Progressing   Problem: Safety: Goal: Ability to remain free from injury will improve 03/30/2019 1704 by Dario Guardian, RN Outcome: Adequate for Discharge 03/30/2019 1011 by Dario Guardian, RN Outcome: Progressing   Problem: Skin Integrity: Goal: Risk for impaired skin integrity will decrease 03/30/2019 1704 by Dario Guardian, RN Outcome: Adequate for Discharge 03/30/2019 1011 by Dario Guardian, RN Outcome: Progressing

## 2019-03-30 NOTE — Progress Notes (Signed)
SATURATION QUALIFICATIONS: (This note is used to comply with regulatory documentation for home oxygen)  Patient Saturations on Room Air at Rest = 96%  Patient Saturations on Room Air while Ambulating = 85%  Patient Saturations on 2 Liters of oxygen while Ambulating = 90%  Please briefly explain why patient needs home oxygen: Pt unable to maintain oxygen saturations >90% without oxygen

## 2019-03-30 NOTE — Discharge Summary (Signed)
[name] JQZ:[id number] DOB: Mar 26, 1951 DOA: 03/24/2019  PCP: Marco Collie, MD  Admit date: 03/24/2019  Discharge date: 03/30/2019  Admitted From: Home  Disposition:  Home   Recommendations for Outpatient Follow-up:   Follow up with PCP in 1-2 weeks  PCP Please obtain BMP/CBC, 2 view CXR in 1week,  (see Discharge instructions)   PCP Please follow up on the following pending results: Please follow BMP and diuretic dose closely   Home Health: PT, RN, SLP, Aide Equipment/Devices: Oxygen 2 L Consultations: Pall Care Discharge Condition: Fair   CODE STATUS: DNR   Diet Recommendation: Soft Heart Healthy 1.5 L fluid restriction.  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                                                                 Hospital Course   Accidental salicylate/NSAID overdose. - Most likely unintentional given patient's mental status. - On admission treated with sodium bicarb, clinically resolved  Acute hypoxemic respiratory failure due to acute on chronic diastolic CHF with EF 62% along with possible viral pneumonia.  Was tested and ruled out for COVID-19 infection x2, CT chest noted, d-dimer and CRP were elevated but normal ferritin.  BNP was sky high.    He was finally diagnosed with acute on chronic diastolic CHF and  aggressively diuresed with excellent results, she has transitioned from nonrebreather to 2 L nasal cannula, laying flat and in no distress, will be discharged on a diuretic regimen and fluid restriction at home with close PCP and outpatient cardiology follow-up, will qualify for 2 L nasal cannula oxygen at this time.  Chronic obstructive pulmonary disease -We will no wheezing.  Supportive care.  Alzheimer's dementia/Hx CVA/hospital delirium? -Continue home dementia medications.  At risk for delirium.  Seizure disorder -Continue home medications  Acute on CKD stage III   Avoid nephrotoxins with gentle hydration renal function improving.  Appears to be close to 1.5 - 1.7.  Essential HTN -Stable placed on Coreg hydralazine Imdur and diuretic regimen  Abdominal pain - KUB negative for ileus or obstruction - N.p.o.  Anemia unspecified - Anemia of chronic disease status post 1 unit of transfusion on 03/27/2019.  Stable H&H continue to monitor.  Goals of care - 4/16 palliative care consult: Demented patient with multiple medical problems consider DNR status change   Discharge diagnosis  Principal Problem:   Acute on chronic respiratory failure with hypoxia (HCC) Active Problems:   COPD (chronic obstructive pulmonary disease) (HCC)   Blind loop syndrome   Subacute delirium   Seizure disorder (HCC)   HTN (hypertension)   Acute renal failure superimposed on stage 3 chronic kidney disease (HCC)   Alzheimer's dementia ([place])   COPD with acute exacerbation (HCC)   NSAID overdose   Anemia, unspecified   Delirium   Palliative care by specialist   Advanced care planning/counseling discussion   Goals of care, counseling/discussion    Discharge instructions    Discharge Instructions    Discharge instructions   Complete by:  As directed    Follow with Primary MD Marco Collie, MD in 7 days   Get CBC, CMP, 2 view Chest X ray -  checked  by Primary MD  in 5-7 days    Activity: As tolerated with Full fall precautions use walker/cane & assistance as needed  Disposition Home   Diet: Soft Heart Healthy with 1.5 L/day total fluid restriction.  Special Instructions: If you have smoked or chewed Tobacco  in the last 2 yrs please stop smoking, stop any regular Alcohol  and or any Recreational drug use.  On your next visit with your primary care physician please Get Medicines reviewed and adjusted.  Please request your Prim.MD to go over all Hospital Tests and Procedure/Radiological results at the follow up, please get all Hospital records sent to your Prim MD by signing hospital release before you go home.  If you experience worsening of your admission symptoms, develop shortness of breath, life threatening emergency, suicidal or homicidal thoughts you must seek medical attention immediately by calling 911 or calling your MD immediately  if symptoms less severe.  You Must read complete instructions/literature along with all the possible adverse reactions/side effects for all the Medicines you take and that have been prescribed to you. Take any new Medicines after you have completely understood and accpet all the possible adverse reactions/side effects.   Increase activity slowly   Complete by:  As directed       Discharge Medications   Allergies as of 03/30/2019      Reactions   Codeine Nausea And Vomiting   REACTION: intolerance   Ace Inhibitors    Latex Other (See Comments)   itching      Medication List    STOP taking these medications   amLODipine 10 MG tablet Commonly known as:  NORVASC   cephALEXin 500 MG capsule Commonly known as:  KEFLEX   diclofenac sodium 1 % Gel Commonly known as:  VOLTAREN     TAKE these medications   albuterol (2.5 MG/3ML) 0.083% nebulizer solution Commonly known as:  PROVENTIL Take 2.5 mg by nebulization every 6 (six) hours as needed for wheezing or shortness of breath.   atorvastatin 20 MG  tablet Commonly known as:  LIPITOR Take 20 mg by mouth daily.   carvedilol 6.25 MG tablet Commonly known as:  COREG Take 6.25 mg by mouth 2 (two) times daily.   donepezil 10 MG tablet Commonly known as:  ARICEPT TAKE 1 BY MOUTH AT BEDTIME What changed:    how much to take  how to take this  when to take this  additional instructions   doxazosin 2 MG tablet Commonly known as:  CARDURA Take 2 mg by mouth daily.   ferrous sulfate 325 (65 FE) MG tablet Take 1 tablet (325 mg total) by mouth 2 (two)  times daily with a meal.   furosemide 40 MG tablet Commonly known as:  Lasix Take 1 tablet (40 mg total) by mouth daily.   hydrALAZINE 50 MG tablet Commonly known as:  APRESOLINE Take 1 tablet (50 mg total) by mouth 3 (three) times daily for 30 days.   hydrOXYzine 25 MG tablet Commonly known as:  ATARAX/VISTARIL Take 12.5 mg by mouth daily as needed for anxiety.   isosorbide mononitrate 30 MG 24 hr tablet Commonly known as:  IMDUR Take 1 tablet (30 mg total) by mouth daily for 30 days.   mirtazapine 45 MG tablet Commonly known as:  REMERON Take 1 tablet by mouth at bedtime.   ondansetron 4 MG disintegrating tablet Commonly known as:  ZOFRAN-ODT Take 4 mg by mouth every 6 (six) hours as needed for nausea or vomiting.   phenytoin 100 MG ER capsule Commonly known as:  DILANTIN Take 2 capsules (200 mg total) by mouth at bedtime.   sodium bicarbonate 650 MG tablet Take 1 tablet (650 mg total) by mouth 3 (three) times daily. What changed:  when to take this   spironolactone 25 MG tablet Commonly known as:  Aldactone Take 1 tablet (25 mg total) by mouth daily for 30 days.            Durable Medical Equipment  (From admission, onward)         Start     Ordered   03/30/19 0915  For home use only DME oxygen  Once    Question Answer Comment  Mode or (Route) Nasal cannula   Liters per Minute 2   Frequency Continuous (stationary and portable oxygen unit needed)    Oxygen conserving device Yes   Oxygen delivery system Gas      03/30/19 0915          Follow-up Information    Marco Collie, MD. Schedule an appointment as soon as possible for a visit in 1 week(s).   Specialty:  Family Medicine Contact information: [place] [address] [phone]        Richardo Priest, MD. Schedule an appointment as soon as possible for a visit in 1 week(s).   Specialty:  Cardiology Contact information: [place] [address] [phone]           Major procedures and Radiology Reports - PLEASE review detailed and final reports thoroughly  -       Ct Chest Wo Contrast  Result Date: 03/28/2019 CLINICAL DATA:  Short of breath.  Heart failure. EXAM: CT CHEST WITHOUT CONTRAST TECHNIQUE: Multidetector CT imaging of the chest was performed following the standard protocol without IV contrast. COMPARISON:  None. FINDINGS: Cardiovascular: PICC line with tip in distal SVC. Minimal coronary calcifications. No pericardial effusion. Atherosclerotic calcification of the aortic arch. Heart size normal. Mediastinum/Nodes: No axillary or supraclavicular adenopathy. No mediastinal hilar adenopathy. Normal esophagus Lungs/Pleura: Diffuse confluent ground-glass opacities in the upper lobes. Ground-glass opacities superimposed on centrilobular emphysema. Moderate bilateral layering pleural effusions. Bibasilar mild passive atelectasis in lower lobes. No focal consolidation.  No pneumothorax.  No suspicious nodularity. Upper Abdomen: Limited view of the liver, kidneys, pancreas are unremarkable. Normal adrenal glands. Musculoskeletal: No aggressive osseous lesion. IMPRESSION: 1. Diffuse ground-glass opacities suggest pulmonary edema. 2. Bilateral pleural effusions. 3. Normal heart size. 4. Coronary artery calcification and Aortic Atherosclerosis (ICD10-I70.0). Electronically Signed   By: Suzy Bouchard M.D.   On: 03/28/2019  11:24  Dg Chest Port 1 View  Result Date: 03/28/2019 CLINICAL DATA:  Shortness of breath. EXAM: PORTABLE CHEST 1 VIEW COMPARISON:  03/27/2019 FINDINGS: Right PICC is unchanged. The cardiomediastinal silhouette is unchanged with normal heart size. Aortic atherosclerosis is noted. The lungs remain hyperinflated related underlying COPD. Mixed interstitial and airspace opacities are again seen relatively diffusely throughout both lungs, stable to slightly worsened in the lung bases and stable to minimally increased in the upper lungs. No large pleural effusion or pneumothorax is identified. Old bilateral rib fractures are again noted. IMPRESSION: Mixed bilateral interstitial and airspace opacities superimposed on COPD without significant interval change. Electronically Signed   By: Logan Bores M.D.   On: 03/28/2019 08:50   Dg Chest Port 1 View  Result Date: 03/27/2019 CLINICAL DATA:  Pulmonary edema. EXAM: PORTABLE CHEST 1 VIEW COMPARISON:  One-view chest x-ray 03/26/2019 FINDINGS: Heart size is normal. Atherosclerotic changes are noted at the aorta. Diffuse interstitial and airspace disease is superimposed on chronic COPD. Aeration is slightly improved since yesterday's study. Remote right-sided rib fractures are again seen. IMPRESSION: 1. Slight improved aeration of both lungs. 2. Persistent diffuse interstitial and airspace disease superimposed on COPD. This likely reflects edema. Infection is not excluded. 3. Aortic atherosclerosis. Electronically Signed   By: San Morelle M.D.   On: 03/27/2019 07:46   Dg Chest Port 1 View  Result Date: 03/26/2019 CLINICAL DATA:  Shortness of breath. EXAM: PORTABLE CHEST 1 VIEW COMPARISON:  03/25/2019 FINDINGS: Right-sided PICC line unchanged with tip just above the cavoatrial junction. Lungs are adequately inflated with no change in moderate bilateral diffuse mixed interstitial airspace opacification. No significant effusion. Cardiomediastinal silhouette and  remainder of the exam is unchanged. IMPRESSION: Moderate stable bilateral mixed interstitial/airspace process. Right-sided PICC line unchanged. Electronically Signed   By: Marin Olp M.D.   On: 03/26/2019 08:21   Dg Chest Port 1 View  Result Date: 03/25/2019 CLINICAL DATA:  68 year old female with respiratory distress EXAM: PORTABLE CHEST 1 VIEW COMPARISON:  03/24/2019, 02/15/2019 FINDINGS: Cardiomediastinal silhouette unchanged in size and contour. Calcifications of the aortic arch. Similar appearance of diffuse reticulonodular pattern opacity with interlobular septal thickening and symmetric airspace opacities with no gradient from superior to inferior. Unchanged right upper extremity PICC IMPRESSION: Similar appearance of mixed interstitial and airspace opacities, with rapid worsening in 24-48 hours. Differential includes edema, multifocal infection, drug reaction, acute interstitial pneumonitis. Unchanged right upper extremity PICC Electronically Signed   By: Corrie Mckusick D.O.   On: 03/25/2019 14:27   Dg Chest Port 1 View  Result Date: 03/24/2019 CLINICAL DATA:  Shortness of breath. Hypertension. Decreased oxygen saturation. EXAM: PORTABLE CHEST 1 VIEW COMPARISON:  03/24/2019 at 1:29 a.m. FINDINGS: 2007 hours. Right PICC line tip at mid right atrium. Patient rotated left. Normal heart size. Atherosclerosis in the transverse aorta. No pleural effusion or pneumothorax. Significantly worsened aeration, with relatively diffuse slightly perihilar predominant right greater than left interstitial and airspace disease. Remote right rib fractures. IMPRESSION: Markedly worsened aeration, with relatively diffuse interstitial and airspace disease. Given time course since the prior exam, favor aspiration or alveolar edema. Progression of infection could look similar. Electronically Signed   By: Abigail Miyamoto M.D.   On: 03/24/2019 19:48   Dg Abd Portable 1v  Result Date: 03/26/2019 CLINICAL DATA:  Lower  abdominal pain and diarrhea.  Dementia. EXAM: PORTABLE ABDOMEN - 1 VIEW COMPARISON:  CT 03/24/2019 FINDINGS: Bowel gas pattern does not show evidence of ileus or obstruction. Double-J ureteral stent in the  left kidney and within the transplanted ureter. Multiple surgical clips in the pelvis. Left iliac artery stent. Cholecystectomy clips. No acute bone finding. IMPRESSION: No acute radiographic finding. Electronically Signed   By: Nelson Chimes M.D.   On: 03/26/2019 15:58   [place] Ekg Site Rite  Result Date: 03/24/2019 If Site Rite image not attached, placement could not be confirmed due to current cardiac rhythm.   Micro Results    Recent Results (from the past 240 hour(s))  SARS Coronavirus 2 [facility] order, Performed in [place] hospital lab)     Status: None   Collection Time: 03/24/19  2:09 PM  Result Value Ref Range Status   SARS Coronavirus 2 NEGATIVE NEGATIVE Final    Comment: (NOTE) If result is NEGATIVE SARS-CoV-2 target nucleic acids are NOT DETECTED. The SARS-CoV-2 RNA is generally detectable in upper and lower  respiratory specimens during the acute phase of infection. The lowest  concentration of SARS-CoV-2 viral copies this assay can detect is 250  copies / mL. A negative result does not preclude SARS-CoV-2 infection  and should not be used as the sole basis for treatment or other  patient management decisions.  A negative result may occur with  improper specimen collection / handling, submission of specimen other  than nasopharyngeal swab, presence of viral mutation(s) within the  areas targeted by this assay, and inadequate number of viral copies  (<250 copies / mL). A negative result must be combined with clinical  observations, patient history, and epidemiological information. If result is POSITIVE SARS-CoV-2 target nucleic acids are DETECTED. The SARS-CoV-2 RNA is generally detectable in upper and lower  respiratory specimens dur ing the acute phase of infection.   Positive  results are indicative of active infection with SARS-CoV-2.  Clinical  correlation with patient history and other diagnostic information is  necessary to determine patient infection status.  Positive results do  not rule out bacterial infection or co-infection with other viruses. If result is PRESUMPTIVE POSTIVE SARS-CoV-2 nucleic acids MAY BE PRESENT.   A presumptive positive result was obtained on the submitted specimen  and confirmed on repeat testing.  While 2019 novel coronavirus  (SARS-CoV-2) nucleic acids may be present in the submitted sample  additional confirmatory testing may be necessary for epidemiological  and / or clinical management purposes  to differentiate between  SARS-CoV-2 and other Sarbecovirus currently known to infect humans.  If clinically indicated additional testing with an alternate test  methodology [phone]) is advised. The SARS-CoV-2 RNA is generally  detectable in upper and lower respiratory sp ecimens during the acute  phase of infection. The expected result is Negative. Fact Sheet for Patients:  StrictlyIdeas.no Fact Sheet for Healthcare Providers: BankingDealers.co.za This test is not yet approved or cleared by the [place] FDA and has been authorized for detection and/or diagnosis of SARS-CoV-2 by FDA under an Emergency Use Authorization (EUA).  This EUA will remain in effect (meaning this test can be used) for the duration of the COVID-19 declaration under Section 564(b)(1) of the Act, 21 U.S.C. section 360bbb-3(b)(1), unless the authorization is terminated or revoked sooner. Performed at [facility], [place] [address] [place], [place] [address]   Novel Coronavirus, NAA (hospital order; send-out to ref lab)     Status: None   Collection Time: 03/24/19  2:09 PM  Result Value Ref Range Status   SARS-CoV-2, NAA NOT DETECTED NOT DETECTED Final    Comment: (NOTE) This test was  developed and its performance characteristics  determined by [employer]. This test has not been FDA cleared or approved. This test has been authorized by FDA under an Emergency Use Authorization (EUA). This test has been validated in accordance with the FDA's Guidance Document (Policy for Roundup in Laboratories Certified to Perform High Complexity Testing under CLIA prior to Emergency Use Authorization for Coronavirus [id number] during the [facility] Emergency) issued on February 29th, 2020. FDA independent review of this validation is pending. This test is only authorized for the duration of time the declaration that circumstances exist justifying the authorization of the emergency use of in vitro diagnostic tests for detection of SARS-CoV- 2 virus and/or diagnosis of COVID-19 infection under section 564(b)(1) of the Act, 21 U.S.C. 696EXB-2(W)(4), unless the authorization is terminated or revoked sooner. Performed At: [facility] [address] [place], [place] [id number] Rush Farmer MD VO:[id number]    [place]  Final    Comment: Performed at [facility], [place] [address], [place], [place] [address]  Culture, blood (Routine X 2) w Reflex to ID Panel     Status: None   Collection Time: 03/24/19  3:35 PM  Result Value Ref Range Status   Specimen Description BLOOD RIGHT PICC LINE  Final   Special Requests   Final    BOTTLES DRAWN AEROBIC ONLY Blood Culture adequate volume   Culture   Final    NO GROWTH 5 DAYS Performed at [facility], [place] [address], [place], [place] [address]    Report Status 03/29/2019 FINAL  Final  Culture, blood (Routine X 2) w Reflex to ID Panel     Status: None   Collection Time: 03/24/19  4:25 PM  Result Value Ref Range Status   Specimen Description BLOOD RIGHT HAND  Final   Special Requests   Final    BOTTLES DRAWN AEROBIC ONLY Blood Culture results may not be optimal due to an  inadequate volume of blood received in culture bottles   Culture   Final    NO GROWTH 5 DAYS Performed at [facility], [place] [address] [place], [place] [address]    Report Status 03/29/2019 FINAL  Final  MRSA PCR Screening     Status: None   Collection Time: 03/25/19  6:22 PM  Result Value Ref Range Status   MRSA by PCR NEGATIVE NEGATIVE Final    Comment:        The GeneXpert MRSA Assay (FDA approved for NASAL specimens only), is one component of a comprehensive MRSA colonization surveillance program. It is not intended to diagnose MRSA infection nor to guide or monitor treatment for MRSA infections. Performed at [facility], [place] [address] [place], [place] [address]   Respiratory Panel by PCR     Status: None   Collection Time: 03/26/19  8:36 AM  Result Value Ref Range Status   Adenovirus NOT DETECTED NOT DETECTED Final   Coronavirus 229E NOT DETECTED NOT DETECTED Final    Comment: (NOTE) The Coronavirus on the Respiratory Panel, DOES NOT test for the novel  Coronavirus (2019 nCoV)    Coronavirus HKU1 NOT DETECTED NOT DETECTED Final   Coronavirus NL63 NOT DETECTED NOT DETECTED Final   Coronavirus OC43 NOT DETECTED NOT DETECTED Final   Metapneumovirus NOT DETECTED NOT DETECTED Final   Rhinovirus / Enterovirus NOT DETECTED NOT DETECTED Final   Influenza A NOT DETECTED NOT DETECTED Final   Influenza B NOT DETECTED NOT DETECTED Final   Parainfluenza  Virus 1 NOT DETECTED NOT DETECTED Final   Parainfluenza Virus 2 NOT DETECTED NOT DETECTED Final   Parainfluenza Virus 3 NOT DETECTED NOT DETECTED Final   Parainfluenza Virus 4 NOT DETECTED NOT DETECTED Final   Respiratory Syncytial Virus NOT DETECTED NOT DETECTED Final   Bordetella pertussis NOT DETECTED NOT DETECTED Final   Chlamydophila pneumoniae NOT DETECTED NOT DETECTED Final   Mycoplasma pneumoniae NOT DETECTED NOT DETECTED Final    Comment: Performed at [facility], [place] [address], [place],  [place] [address]    Today   Subjective    [name] today has no headache,no chest abdominal pain,no new weakness tingling or numbness, feels much better wants to go home today.    Objective   Blood pressure (!) 160/65, pulse 73, temperature 97.6 F (36.4 C), temperature source Oral, resp. rate 18, height 5' (1.524 m), weight 46.5 kg, SpO2 95 %.   Intake/Output Summary (Last 24 hours) at 03/30/2019 0917 Last data filed at 03/30/2019 0859 Gross per 24 hour  Intake 120 ml  Output 1625 ml  Net -1505 ml    Exam Awake Alert,  No new F.N deficits, Normal affect .AT,PERRAL Supple Neck,No JVD, No cervical lymphadenopathy appriciated.  Symmetrical Chest wall movement, Good air movement bilaterally, CTAB RRR,No Gallops,Rubs or new Murmurs, No Parasternal Heave +ve B.Sounds, Abd Soft, Non tender, No organomegaly appriciated, No rebound -guarding or rigidity. No Cyanosis, Clubbing or edema, No new Rash or bruise   Data Review   CBC w Diff:  Lab Results  Component Value Date   WBC 8.1 03/29/2019   HGB 8.6 (L) 03/29/2019   HGB 11.0 (L) 10/13/2018   HCT 28.6 (L) 03/29/2019   HCT 35.6 10/13/2018   PLT 103 (L) 03/29/2019   PLT 204 10/13/2018   LYMPHOPCT 23 03/29/2019   MONOPCT 8 03/29/2019   EOSPCT 2 03/29/2019   BASOPCT 0 03/29/2019    CMP:  Lab Results  Component Value Date   NA 140 03/30/2019   NA 138 10/13/2018   K 4.3 03/30/2019   CL 104 03/30/2019   CO2 33 (H) 03/30/2019   BUN 13 03/30/2019   BUN 15 10/13/2018   CREATININE 1.23 (H) 03/30/2019   PROT 4.4 (L) 03/30/2019   PROT 7.1 10/13/2018   ALBUMIN 1.8 (L) 03/30/2019   ALBUMIN 4.1 10/13/2018   BILITOT 0.5 03/30/2019   BILITOT <0.2 10/13/2018   ALKPHOS 46 03/30/2019   AST 10 (L) 03/30/2019   ALT 6 03/30/2019  .   Total Time in preparing paper work, data evaluation and todays exam - 44 minutes  Lala Lund M.D on 03/30/2019 at 9:17 AM  [facility]   Office  [phone]

## 2019-03-30 NOTE — Plan of Care (Signed)
  Problem: Education: ?Goal: Knowledge of General Education information will improve ?Description: Including pain rating scale, medication(s)/side effects and non-pharmacologic comfort measures ?Outcome: Progressing ?  ?Problem: Clinical Measurements: ?Goal: Respiratory complications will improve ?Outcome: Progressing ?  ?Problem: Activity: ?Goal: Risk for activity intolerance will decrease ?Outcome: Progressing ?  ?Problem: Elimination: ?Goal: Will not experience complications related to bowel motility ?Outcome: Progressing ?Goal: Will not experience complications related to urinary retention ?Outcome: Progressing ?  ?Problem: Pain Managment: ?Goal: General experience of comfort will improve ?Outcome: Progressing ?  ?Problem: Safety: ?Goal: Ability to remain free from injury will improve ?Outcome: Progressing ?  ?Problem: Skin Integrity: ?Goal: Risk for impaired skin integrity will decrease ?Outcome: Progressing ?  ?

## 2019-03-30 NOTE — Consult Note (Signed)
[facility]--Rec'd referral on 4/17 from [place].  Reached out to son Loralai Eisman today to introduce him to our home-based palliative care program.  Plan to make home visit @ 10 am on Tues. 03/31/2019 to do enrollment visit.  Thank you for referral and opportunity to serve pt/family.  Wynetta Fines, RN

## 2019-03-30 NOTE — Progress Notes (Signed)
Dr. Candiss Norse paged regarding pt foley status at discharge. Pt states she has chronic foley at home. Dr. Candiss Norse foley was to stay in place at discharge.

## 2019-03-30 NOTE — Progress Notes (Signed)
Levada Dy, case manager, made aware of pt walking oxygen saturations being documented.

## 2019-03-30 NOTE — Discharge Instructions (Signed)
Do not use over-the-counter aspirin, Motrin, Aleve, NSAIDs.    Follow with Primary MD Marco Collie, MD in 7 days   Get CBC, CMP, 2 view Chest X ray -  checked  by Primary MD  in 5-7 days   Activity: As tolerated with Full fall precautions use walker/cane & assistance as needed  Disposition Home   Diet: Soft Heart Healthy with 1.5 L/day total fluid restriction.  Special Instructions: If you have smoked or chewed Tobacco  in the last 2 yrs please stop smoking, stop any regular Alcohol  and or any Recreational drug use.  On your next visit with your primary care physician please Get Medicines reviewed and adjusted.  Please request your Prim.MD to go over all Hospital Tests and Procedure/Radiological results at the follow up, please get all Hospital records sent to your Prim MD by signing hospital release before you go home.  If you experience worsening of your admission symptoms, develop shortness of breath, life threatening emergency, suicidal or homicidal thoughts you must seek medical attention immediately by calling 911 or calling your MD immediately  if symptoms less severe.  You Must read complete instructions/literature along with all the possible adverse reactions/side effects for all the Medicines you take and that have been prescribed to you. Take any new Medicines after you have completely understood and accpet all the possible adverse reactions/side effects.

## 2019-03-30 NOTE — Progress Notes (Signed)
Physical Therapy Treatment Patient Details Name: [name] MRN: [id number] DOB: 06-21-1951 Today's Date: 03/30/2019    History of Present Illness 68 y.o. female presenting with abdominal pain and SOB. 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    PT Comments    Continued to work on mobility progression and strengthening today without any significant issues reported. Pt does fatigue quickly. Improved SaO2 levels with use of oxygen with gait today. Acute PT to continue during pt's hospital stay.    Follow Up Recommendations  Home health PT;Supervision/Assistance - 24 hour     Equipment Recommendations  None recommended by PT    Precautions / Restrictions Precautions Precautions: Fall Precaution Comments: watch O2 saturations Restrictions Weight Bearing Restrictions: No    Mobility  Bed Mobility               General bed mobility comments: recieved in chair  Transfers Overall transfer level: Needs assistance Equipment used: Rolling walker (2 wheeled) Transfers: Sit to/from Stand Sit to Stand: Min guard         General transfer comment: cues for hand placement and weight shifting to assist with standing up.   Ambulation/Gait Ambulation/Gait assistance: Min assist Gait Distance (Feet): 30 Feet Assistive device: Rolling walker (2 wheeled) Gait Pattern/deviations: Step-through pattern;Decreased stride length;Shuffle;Narrow base of support;Trunk flexed Gait velocity: decreased   General Gait Details: cues on posture, step length and walker position with gait. On 2 lpm [place] with SaO2 >/= 94% with session.         Cognition  Arousal/Alertness: Awake/alert Behavior During Therapy: WFL for tasks assessed/performed Overall Cognitive Status: History of cognitive impairments - at baseline                 Exercises General Exercises - Lower Extremity Ankle Circles/Pumps: AROM;Strengthening;Both;10 reps;Seated(with footrest up) Long Arc Quad: AROM;Strengthening;Both;10 reps;Seated Hip ABduction/ADduction: AAROM;Strengthening;Both;10 reps;Seated(with footrest up) Straight Leg Raises: AAROM;Strengthening;Both;10 reps;Seated(with foot rest up)     Prior Function            PT Goals (current goals can now be found in the care plan section) Acute Rehab PT Goals Patient Stated Goal: to not hurt PT Goal Formulation: With patient Time For Goal Achievement: 04/12/19 Potential to Achieve Goals: Good Progress towards PT goals: Progressing toward goals    Frequency    Min 3X/week      PT Plan Current plan remains appropriate       AM-PAC PT "6 Clicks" Mobility   Outcome Measure  Help needed turning from your back to your side while in a flat bed without using bedrails?: A Little Help needed moving from lying on your back to sitting on the side of a flat bed without using bedrails?: A Little Help needed moving to and from a bed to a chair (including a wheelchair)?: A Little Help needed standing up from a chair using your arms (e.g., wheelchair or bedside chair)?: A Little Help needed to walk in hospital room?: A Little Help needed climbing 3-5 steps with a railing? : A Lot 6 Click Score: 17    End of Session Equipment Utilized During Treatment: Gait belt;Oxygen Activity Tolerance: Patient tolerated treatment well;Patient limited by fatigue Patient left:  in chair;with call bell/phone within reach;with chair alarm set Nurse Communication: Mobility status PT Visit Diagnosis: Unsteadiness on feet (R26.81);Difficulty in walking, not elsewhere classified (R26.2);Other symptoms and signs involving the  nervous system (R29.898);Muscle weakness (generalized) (M62.81)     Time: 3358-2518 PT Time Calculation (min) (ACUTE ONLY): 16 min  Charges:  $Therapeutic Exercise: 8-22 mins                    Willow Ora, PTA, [facility] Acute Rehab Services [phone] 03/30/19, 3:50 PM   Willow Ora 03/30/2019, 3:48 PM

## 2019-03-31 ENCOUNTER — Other Ambulatory Visit: Payer: Self-pay

## 2019-03-31 DIAGNOSIS — J449 Chronic obstructive pulmonary disease, unspecified: Secondary | ICD-10-CM | POA: Diagnosis not present

## 2019-03-31 DIAGNOSIS — Z09 Encounter for follow-up examination after completed treatment for conditions other than malignant neoplasm: Secondary | ICD-10-CM | POA: Diagnosis not present

## 2019-03-31 DIAGNOSIS — T39391S Poisoning by other nonsteroidal anti-inflammatory drugs [NSAID], accidental (unintentional), sequela: Secondary | ICD-10-CM | POA: Diagnosis not present

## 2019-03-31 DIAGNOSIS — J9601 Acute respiratory failure with hypoxia: Secondary | ICD-10-CM | POA: Diagnosis not present

## 2019-03-31 DIAGNOSIS — I5032 Chronic diastolic (congestive) heart failure: Secondary | ICD-10-CM | POA: Diagnosis not present

## 2019-03-31 DIAGNOSIS — Z79899 Other long term (current) drug therapy: Secondary | ICD-10-CM | POA: Diagnosis not present

## 2019-04-01 ENCOUNTER — Telehealth: Payer: Self-pay | Admitting: Cardiology

## 2019-04-01 DIAGNOSIS — E43 Unspecified severe protein-calorie malnutrition: Secondary | ICD-10-CM | POA: Diagnosis not present

## 2019-04-01 DIAGNOSIS — D696 Thrombocytopenia, unspecified: Secondary | ICD-10-CM | POA: Diagnosis not present

## 2019-04-01 DIAGNOSIS — N390 Urinary tract infection, site not specified: Secondary | ICD-10-CM | POA: Diagnosis not present

## 2019-04-01 DIAGNOSIS — N179 Acute kidney failure, unspecified: Secondary | ICD-10-CM | POA: Diagnosis not present

## 2019-04-01 DIAGNOSIS — I7774 Dissection of vertebral artery: Secondary | ICD-10-CM | POA: Diagnosis not present

## 2019-04-01 DIAGNOSIS — J439 Emphysema, unspecified: Secondary | ICD-10-CM | POA: Diagnosis not present

## 2019-04-01 DIAGNOSIS — N183 Chronic kidney disease, stage 3 (moderate): Secondary | ICD-10-CM | POA: Diagnosis not present

## 2019-04-01 DIAGNOSIS — I739 Peripheral vascular disease, unspecified: Secondary | ICD-10-CM | POA: Diagnosis not present

## 2019-04-01 DIAGNOSIS — K219 Gastro-esophageal reflux disease without esophagitis: Secondary | ICD-10-CM | POA: Diagnosis not present

## 2019-04-01 DIAGNOSIS — D631 Anemia in chronic kidney disease: Secondary | ICD-10-CM | POA: Diagnosis not present

## 2019-04-01 DIAGNOSIS — J9601 Acute respiratory failure with hypoxia: Secondary | ICD-10-CM | POA: Diagnosis not present

## 2019-04-01 DIAGNOSIS — M810 Age-related osteoporosis without current pathological fracture: Secondary | ICD-10-CM | POA: Diagnosis not present

## 2019-04-01 DIAGNOSIS — I5033 Acute on chronic diastolic (congestive) heart failure: Secondary | ICD-10-CM | POA: Diagnosis not present

## 2019-04-01 DIAGNOSIS — Z466 Encounter for fitting and adjustment of urinary device: Secondary | ICD-10-CM | POA: Diagnosis not present

## 2019-04-01 DIAGNOSIS — T39391D Poisoning by other nonsteroidal anti-inflammatory drugs [NSAID], accidental (unintentional), subsequent encounter: Secondary | ICD-10-CM | POA: Diagnosis not present

## 2019-04-01 DIAGNOSIS — D689 Coagulation defect, unspecified: Secondary | ICD-10-CM | POA: Diagnosis not present

## 2019-04-01 DIAGNOSIS — Z8673 Personal history of transient ischemic attack (TIA), and cerebral infarction without residual deficits: Secondary | ICD-10-CM | POA: Diagnosis not present

## 2019-04-01 DIAGNOSIS — I130 Hypertensive heart and chronic kidney disease with heart failure and stage 1 through stage 4 chronic kidney disease, or unspecified chronic kidney disease: Secondary | ICD-10-CM | POA: Diagnosis not present

## 2019-04-01 DIAGNOSIS — N319 Neuromuscular dysfunction of bladder, unspecified: Secondary | ICD-10-CM | POA: Diagnosis not present

## 2019-04-01 DIAGNOSIS — G300 Alzheimer's disease with early onset: Secondary | ICD-10-CM | POA: Diagnosis not present

## 2019-04-01 NOTE — Telephone Encounter (Signed)
Virtual Visit Pre-Appointment Phone Call  "(Name), I am calling you today to discuss your upcoming appointment. We are currently trying to limit exposure to the virus that causes COVID-19 by seeing patients at home rather than in the office."  1. "What is the BEST phone number to call the day of the visit?" - include this in appointment notes  2. Do you have or have access to (through a family member/friend) a smartphone with video capability that we can use for your visit?" a. If yes - list this number in appt notes as cell (if different from BEST phone #) and list the appointment type as a VIDEO visit in appointment notes b. If no - list the appointment type as a PHONE visit in appointment notes  3. Confirm consent - "In the setting of the current Covid19 crisis, you are scheduled for a (phone or video) visit with your provider on (date) at (time).  Just as we do with many in-office visits, in order for you to participate in this visit, we must obtain consent.  If you'd like, I can send this to your mychart (if signed up) or email for you to review.  Otherwise, I can obtain your verbal consent now.  All virtual visits are billed to your insurance company just like a normal visit would be.  By agreeing to a virtual visit, we'd like you to understand that the technology does not allow for your provider to perform an examination, and thus may limit your provider's ability to fully assess your condition. If your provider identifies any concerns that need to be evaluated in person, we will make arrangements to do so.  Finally, though the technology is pretty good, we cannot assure that it will always work on either your or our end, and in the setting of a video visit, we may have to convert it to a phone-only visit.  In either situation, we cannot ensure that we have a secure connection.  Are you willing to proceed?" STAFF: Did the patient verbally acknowledge consent to telehealth visit? Document  YES/NO here: YES 4.   5. Advise patient to be prepared - "Two hours prior to your appointment, go ahead and check your blood pressure, pulse, oxygen saturation, and your weight (if you have the equipment to check those) and write them all down. When your visit starts, your provider will ask you for this information. If you have an Apple Watch or Kardia device, please plan to have heart rate information ready on the day of your appointment. Please have a pen and paper handy nearby the day of the visit as well."  6. Give patient instructions for MyChart download to smartphone OR Doximity/Doxy.me as below if video visit (depending on what platform provider is using)  7. Inform patient they will receive a phone call 15 minutes prior to their appointment time (may be from unknown caller ID) so they should be prepared to answer    TELEPHONE CALL NOTE  [name] has been deemed a candidate for a follow-up tele-health visit to limit community exposure during the Covid-19 pandemic. I spoke with the patient via phone to ensure availability of phone/video source, confirm preferred email & phone number, and discuss instructions and expectations.  I reminded [name] to be prepared with any vital sign and/or heart rhythm information that could potentially be obtained via home monitoring, at the time of her visit. I reminded [name] to expect a phone call  prior to her visit.  Frederic Jericho 04/01/2019 2:15 PM   INSTRUCTIONS FOR DOWNLOADING THE MYCHART APP TO SMARTPHONE  - The patient must first make sure to have activated MyChart and know their login information - If Apple, go to [employer] and type in MyChart in the search bar and download the app. If Android, ask patient to go to [employer] and type in [place] in the search bar and download the app. The app is free but as with any other app downloads, their phone may require them to verify saved payment information or Apple/Android password.    - The patient will need to then log into the app with their MyChart username and password, and select [name] as their healthcare provider to link the account. When it is time for your visit, go to the MyChart app, find appointments, and click Begin Video Visit. Be sure to Select Allow for your device to access the Microphone and Camera for your visit. You will then be connected, and your provider will be with you shortly.  **If they have any issues connecting, or need assistance please contact MyChart service desk [phone])**  **If using a computer, in order to ensure the best quality for their visit they will need to use either of the following Internet Browsers: [employer], or Google Chrome**  IF USING DOXIMITY or DOXY.ME - The patient will receive a link just prior to their visit by text.     FULL LENGTH CONSENT FOR TELE-HEALTH VISIT   I hereby voluntarily request, consent and authorize [facility] and its employed or contracted physicians, physician assistants, nurse practitioners or other licensed health care professionals (the Practitioner), to provide me with telemedicine health care services (the Services") as deemed necessary by the treating Practitioner. I acknowledge and consent to receive the Services by the Practitioner via telemedicine. I understand that the telemedicine visit will involve communicating with the Practitioner through live audiovisual communication technology and the disclosure of certain medical information by electronic transmission. I acknowledge that I have been given the opportunity to request an in-person assessment or other available alternative prior to the telemedicine visit and am voluntarily participating in the telemedicine visit.  I understand that I have the right to withhold or withdraw my consent to the use of telemedicine in the course of my care at any time, without affecting my right to future care or treatment, and that  the Practitioner or I may terminate the telemedicine visit at any time. I understand that I have the right to inspect all information obtained and/or recorded in the course of the telemedicine visit and may receive copies of available information for a reasonable fee.  I understand that some of the potential risks of receiving the Services via telemedicine include:   Delay or interruption in medical evaluation due to technological equipment failure or disruption;  Information transmitted may not be sufficient (e.g. poor resolution of images) to allow for appropriate medical decision making by the Practitioner; and/or   In rare instances, security protocols could fail, causing a breach of personal health information.  Furthermore, I acknowledge that it is my responsibility to provide information about my medical history, conditions and care that is complete and accurate to the best of my ability. I acknowledge that Practitioner's advice, recommendations, and/or decision may be based on factors not within their control, such as incomplete or inaccurate data provided by me or distortions of diagnostic images or specimens that may result from  electronic transmissions. I understand that the practice of medicine is not an exact science and that Practitioner makes no warranties or guarantees regarding treatment outcomes. I acknowledge that I will receive a copy of this consent concurrently upon execution via email to the email address I last provided but may also request a printed copy by calling the office of [facility].    I understand that my insurance will be billed for this visit.   I have read or had this consent read to me.  I understand the contents of this consent, which adequately explains the benefits and risks of the Services being provided via telemedicine.   I have been provided ample opportunity to ask questions regarding this consent and the Services and have had my questions answered to  my satisfaction.  I give my informed consent for the services to be provided through the use of telemedicine in my medical care  By participating in this telemedicine visit I agree to the above.

## 2019-04-02 DIAGNOSIS — R509 Fever, unspecified: Secondary | ICD-10-CM | POA: Diagnosis not present

## 2019-04-02 DIAGNOSIS — K529 Noninfective gastroenteritis and colitis, unspecified: Secondary | ICD-10-CM | POA: Diagnosis not present

## 2019-04-02 DIAGNOSIS — N390 Urinary tract infection, site not specified: Secondary | ICD-10-CM | POA: Diagnosis not present

## 2019-04-02 DIAGNOSIS — R197 Diarrhea, unspecified: Secondary | ICD-10-CM | POA: Diagnosis not present

## 2019-04-02 DIAGNOSIS — Z1621 Resistance to vancomycin: Secondary | ICD-10-CM | POA: Diagnosis not present

## 2019-04-02 DIAGNOSIS — R569 Unspecified convulsions: Secondary | ICD-10-CM | POA: Diagnosis not present

## 2019-04-02 DIAGNOSIS — R4781 Slurred speech: Secondary | ICD-10-CM | POA: Diagnosis not present

## 2019-04-02 DIAGNOSIS — R935 Abnormal findings on diagnostic imaging of other abdominal regions, including retroperitoneum: Secondary | ICD-10-CM | POA: Diagnosis not present

## 2019-04-02 DIAGNOSIS — E876 Hypokalemia: Secondary | ICD-10-CM | POA: Diagnosis not present

## 2019-04-02 DIAGNOSIS — M1990 Unspecified osteoarthritis, unspecified site: Secondary | ICD-10-CM | POA: Diagnosis not present

## 2019-04-02 DIAGNOSIS — F0390 Unspecified dementia without behavioral disturbance: Secondary | ICD-10-CM | POA: Diagnosis not present

## 2019-04-02 DIAGNOSIS — R918 Other nonspecific abnormal finding of lung field: Secondary | ICD-10-CM | POA: Diagnosis not present

## 2019-04-02 DIAGNOSIS — Z8673 Personal history of transient ischemic attack (TIA), and cerebral infarction without residual deficits: Secondary | ICD-10-CM | POA: Diagnosis not present

## 2019-04-02 DIAGNOSIS — I10 Essential (primary) hypertension: Secondary | ICD-10-CM | POA: Diagnosis not present

## 2019-04-02 DIAGNOSIS — H7010 Chronic mastoiditis, unspecified ear: Secondary | ICD-10-CM | POA: Diagnosis not present

## 2019-04-02 DIAGNOSIS — J328 Other chronic sinusitis: Secondary | ICD-10-CM | POA: Diagnosis not present

## 2019-04-02 DIAGNOSIS — R410 Disorientation, unspecified: Secondary | ICD-10-CM | POA: Diagnosis not present

## 2019-04-02 DIAGNOSIS — N3000 Acute cystitis without hematuria: Secondary | ICD-10-CM | POA: Diagnosis not present

## 2019-04-02 DIAGNOSIS — R4701 Aphasia: Secondary | ICD-10-CM | POA: Diagnosis not present

## 2019-04-02 DIAGNOSIS — Z888 Allergy status to other drugs, medicaments and biological substances status: Secondary | ICD-10-CM | POA: Diagnosis not present

## 2019-04-02 DIAGNOSIS — J9611 Chronic respiratory failure with hypoxia: Secondary | ICD-10-CM | POA: Diagnosis not present

## 2019-04-02 DIAGNOSIS — R404 Transient alteration of awareness: Secondary | ICD-10-CM | POA: Diagnosis not present

## 2019-04-02 DIAGNOSIS — G309 Alzheimer's disease, unspecified: Secondary | ICD-10-CM | POA: Diagnosis not present

## 2019-04-02 DIAGNOSIS — Z87440 Personal history of urinary (tract) infections: Secondary | ICD-10-CM | POA: Diagnosis not present

## 2019-04-02 DIAGNOSIS — J449 Chronic obstructive pulmonary disease, unspecified: Secondary | ICD-10-CM | POA: Diagnosis not present

## 2019-04-02 DIAGNOSIS — Z881 Allergy status to other antibiotic agents status: Secondary | ICD-10-CM | POA: Diagnosis not present

## 2019-04-02 DIAGNOSIS — N183 Chronic kidney disease, stage 3 (moderate): Secondary | ICD-10-CM | POA: Diagnosis not present

## 2019-04-02 DIAGNOSIS — R29810 Facial weakness: Secondary | ICD-10-CM | POA: Diagnosis not present

## 2019-04-02 DIAGNOSIS — Z885 Allergy status to narcotic agent status: Secondary | ICD-10-CM | POA: Diagnosis not present

## 2019-04-02 DIAGNOSIS — G9341 Metabolic encephalopathy: Secondary | ICD-10-CM | POA: Diagnosis not present

## 2019-04-02 DIAGNOSIS — I129 Hypertensive chronic kidney disease with stage 1 through stage 4 chronic kidney disease, or unspecified chronic kidney disease: Secondary | ICD-10-CM | POA: Diagnosis not present

## 2019-04-03 ENCOUNTER — Telehealth: Payer: Medicare Other | Admitting: Cardiology

## 2019-04-03 ENCOUNTER — Other Ambulatory Visit: Payer: Self-pay

## 2019-04-03 ENCOUNTER — Telehealth: Payer: Self-pay | Admitting: Cardiology

## 2019-04-03 NOTE — Telephone Encounter (Signed)
Called patient to register for visit and patient was admitted to hospital for seizures on 04/02/2019.

## 2019-04-07 ENCOUNTER — Other Ambulatory Visit: Payer: Self-pay | Admitting: Pharmacist

## 2019-04-07 ENCOUNTER — Ambulatory Visit: Payer: Self-pay | Admitting: Pharmacist

## 2019-04-07 DIAGNOSIS — D689 Coagulation defect, unspecified: Secondary | ICD-10-CM | POA: Diagnosis not present

## 2019-04-07 DIAGNOSIS — K219 Gastro-esophageal reflux disease without esophagitis: Secondary | ICD-10-CM | POA: Diagnosis not present

## 2019-04-07 DIAGNOSIS — N183 Chronic kidney disease, stage 3 (moderate): Secondary | ICD-10-CM | POA: Diagnosis not present

## 2019-04-07 DIAGNOSIS — N390 Urinary tract infection, site not specified: Secondary | ICD-10-CM | POA: Diagnosis not present

## 2019-04-07 DIAGNOSIS — I5033 Acute on chronic diastolic (congestive) heart failure: Secondary | ICD-10-CM | POA: Diagnosis not present

## 2019-04-07 DIAGNOSIS — M810 Age-related osteoporosis without current pathological fracture: Secondary | ICD-10-CM | POA: Diagnosis not present

## 2019-04-07 DIAGNOSIS — Z8673 Personal history of transient ischemic attack (TIA), and cerebral infarction without residual deficits: Secondary | ICD-10-CM | POA: Diagnosis not present

## 2019-04-07 DIAGNOSIS — N179 Acute kidney failure, unspecified: Secondary | ICD-10-CM | POA: Diagnosis not present

## 2019-04-07 DIAGNOSIS — Z466 Encounter for fitting and adjustment of urinary device: Secondary | ICD-10-CM | POA: Diagnosis not present

## 2019-04-07 DIAGNOSIS — G300 Alzheimer's disease with early onset: Secondary | ICD-10-CM | POA: Diagnosis not present

## 2019-04-07 DIAGNOSIS — E43 Unspecified severe protein-calorie malnutrition: Secondary | ICD-10-CM | POA: Diagnosis not present

## 2019-04-07 DIAGNOSIS — J9601 Acute respiratory failure with hypoxia: Secondary | ICD-10-CM | POA: Diagnosis not present

## 2019-04-07 DIAGNOSIS — I130 Hypertensive heart and chronic kidney disease with heart failure and stage 1 through stage 4 chronic kidney disease, or unspecified chronic kidney disease: Secondary | ICD-10-CM | POA: Diagnosis not present

## 2019-04-07 DIAGNOSIS — J439 Emphysema, unspecified: Secondary | ICD-10-CM | POA: Diagnosis not present

## 2019-04-07 DIAGNOSIS — I7774 Dissection of vertebral artery: Secondary | ICD-10-CM | POA: Diagnosis not present

## 2019-04-07 DIAGNOSIS — I739 Peripheral vascular disease, unspecified: Secondary | ICD-10-CM | POA: Diagnosis not present

## 2019-04-07 DIAGNOSIS — T39391D Poisoning by other nonsteroidal anti-inflammatory drugs [NSAID], accidental (unintentional), subsequent encounter: Secondary | ICD-10-CM | POA: Diagnosis not present

## 2019-04-07 DIAGNOSIS — D631 Anemia in chronic kidney disease: Secondary | ICD-10-CM | POA: Diagnosis not present

## 2019-04-07 DIAGNOSIS — N319 Neuromuscular dysfunction of bladder, unspecified: Secondary | ICD-10-CM | POA: Diagnosis not present

## 2019-04-07 DIAGNOSIS — D696 Thrombocytopenia, unspecified: Secondary | ICD-10-CM | POA: Diagnosis not present

## 2019-04-07 NOTE — Patient Outreach (Signed)
[facility]) Care Management  04/07/2019  [name] August 31, 1951 [id number]   Called patient to follow up post discharge. Unfortunately, she did not answer the phone. HIPAA compliant message left on her son's voicemail.  Patient's son called me back and asked that I reach out to the patient's caregiver. The number provided was called but no one answered and a voicemail could not be left.  Plan: Call patient back in 2-3 business days.   [name], PharmD, [place] Clinical Pharmacist [phone]

## 2019-04-08 ENCOUNTER — Telehealth: Payer: Self-pay | Admitting: Neurology

## 2019-04-08 DIAGNOSIS — N390 Urinary tract infection, site not specified: Secondary | ICD-10-CM | POA: Diagnosis not present

## 2019-04-08 DIAGNOSIS — E43 Unspecified severe protein-calorie malnutrition: Secondary | ICD-10-CM | POA: Diagnosis not present

## 2019-04-08 DIAGNOSIS — N319 Neuromuscular dysfunction of bladder, unspecified: Secondary | ICD-10-CM | POA: Diagnosis not present

## 2019-04-08 DIAGNOSIS — Z8673 Personal history of transient ischemic attack (TIA), and cerebral infarction without residual deficits: Secondary | ICD-10-CM | POA: Diagnosis not present

## 2019-04-08 DIAGNOSIS — J9601 Acute respiratory failure with hypoxia: Secondary | ICD-10-CM | POA: Diagnosis not present

## 2019-04-08 DIAGNOSIS — I130 Hypertensive heart and chronic kidney disease with heart failure and stage 1 through stage 4 chronic kidney disease, or unspecified chronic kidney disease: Secondary | ICD-10-CM | POA: Diagnosis not present

## 2019-04-08 DIAGNOSIS — M810 Age-related osteoporosis without current pathological fracture: Secondary | ICD-10-CM | POA: Diagnosis not present

## 2019-04-08 DIAGNOSIS — I739 Peripheral vascular disease, unspecified: Secondary | ICD-10-CM | POA: Diagnosis not present

## 2019-04-08 DIAGNOSIS — N183 Chronic kidney disease, stage 3 (moderate): Secondary | ICD-10-CM | POA: Diagnosis not present

## 2019-04-08 DIAGNOSIS — N179 Acute kidney failure, unspecified: Secondary | ICD-10-CM | POA: Diagnosis not present

## 2019-04-08 DIAGNOSIS — J439 Emphysema, unspecified: Secondary | ICD-10-CM | POA: Diagnosis not present

## 2019-04-08 DIAGNOSIS — T39391D Poisoning by other nonsteroidal anti-inflammatory drugs [NSAID], accidental (unintentional), subsequent encounter: Secondary | ICD-10-CM | POA: Diagnosis not present

## 2019-04-08 DIAGNOSIS — Z466 Encounter for fitting and adjustment of urinary device: Secondary | ICD-10-CM | POA: Diagnosis not present

## 2019-04-08 DIAGNOSIS — I5033 Acute on chronic diastolic (congestive) heart failure: Secondary | ICD-10-CM | POA: Diagnosis not present

## 2019-04-08 DIAGNOSIS — I7774 Dissection of vertebral artery: Secondary | ICD-10-CM | POA: Diagnosis not present

## 2019-04-08 DIAGNOSIS — D689 Coagulation defect, unspecified: Secondary | ICD-10-CM | POA: Diagnosis not present

## 2019-04-08 DIAGNOSIS — D696 Thrombocytopenia, unspecified: Secondary | ICD-10-CM | POA: Diagnosis not present

## 2019-04-08 DIAGNOSIS — G300 Alzheimer's disease with early onset: Secondary | ICD-10-CM | POA: Diagnosis not present

## 2019-04-08 DIAGNOSIS — K219 Gastro-esophageal reflux disease without esophagitis: Secondary | ICD-10-CM | POA: Diagnosis not present

## 2019-04-08 DIAGNOSIS — D631 Anemia in chronic kidney disease: Secondary | ICD-10-CM | POA: Diagnosis not present

## 2019-04-08 NOTE — Telephone Encounter (Signed)
Due to current COVID 19 pandemic, our office is severely reducing in office visits until further notice, in order to minimize the risk to our patients and healthcare providers.   Called patient to offer sooner appt via virtual visit. I spoke with her son who gave consent for patient and will help patient with visit. Patient's son verbalized understanding of the doxy.me process and understands he will receive an e-mail at [email or web address] with directions and Dr. Edwena Felty link. I advised that the patient will receive calls from RN and front office staff prior to appointment.  Pt understands that although there may be some limitations with this type of visit, we will take all precautions to reduce any security or privacy concerns.  Pt understands that this will be treated like an in office visit and we will file with pt's insurance, and there may be a patient responsible charge related to this service.

## 2019-04-09 DIAGNOSIS — R339 Retention of urine, unspecified: Secondary | ICD-10-CM | POA: Diagnosis not present

## 2019-04-09 DIAGNOSIS — Z09 Encounter for follow-up examination after completed treatment for conditions other than malignant neoplasm: Secondary | ICD-10-CM | POA: Diagnosis not present

## 2019-04-09 DIAGNOSIS — K529 Noninfective gastroenteritis and colitis, unspecified: Secondary | ICD-10-CM | POA: Diagnosis not present

## 2019-04-09 DIAGNOSIS — N390 Urinary tract infection, site not specified: Secondary | ICD-10-CM | POA: Diagnosis not present

## 2019-04-09 DIAGNOSIS — G4700 Insomnia, unspecified: Secondary | ICD-10-CM | POA: Diagnosis not present

## 2019-04-09 NOTE — Telephone Encounter (Signed)
Called a 2nd time for the pt. No answer

## 2019-04-09 NOTE — Telephone Encounter (Signed)
Called the patient to review her chart for the upcoming apt on Monday. There was no answer and was unable to leave a voice mail.

## 2019-04-10 ENCOUNTER — Other Ambulatory Visit: Payer: Self-pay | Admitting: Pharmacist

## 2019-04-10 ENCOUNTER — Ambulatory Visit: Payer: Self-pay | Admitting: Pharmacist

## 2019-04-10 ENCOUNTER — Other Ambulatory Visit: Payer: Self-pay

## 2019-04-10 NOTE — Patient Outreach (Signed)
[facility]) Care Management  04/10/2019  AHNNA DUNGAN 1951-10-02 [id number]  Called patient regarding post discharge medication review. Left message on her son's voicemail.  Unsuccessful outreach letter sent to patient. Today's call is the second unsuccessful outreach call.  Plan: Call patient back in 7-10 business days.   ADDENDUM Patient's son called me back and gave me the phone number for the patient's caregiver as her personal number is not working. Caregiver Richardson Landry), did not answer the phone. HIPAA compliant message left on that number as well.  Plan: Call patient back in 7-10 business days.   [name], PharmD, [place] Clinical Pharmacist [phone]

## 2019-04-10 NOTE — Patient Outreach (Signed)
[facility]) Care Management  04/10/2019  [name] 1951-04-29 [id number]    EMMI-PNA RED ON EMMI ALERT Day # 8 Date: 04/09/2019 Red Alert Reason: "More SOB than yesterday? Yes  Wheezing more than yesterday? Yes"   Outreach attempt # 1 to patient. Spoke with patient's son with patient's permission. Son voices that patient has been answering automated calls. Reviewed and addressed red alerts. He voices error in response from patient. He denies patient experiencing and/or complaining of any SOB and wheezing. He voices that she is "using oxygen and breathing fine." He reports that patient had tele health PCP visit on yesterday. He denies any other RN CM needs or concerns at this time. Caregiver informed that patient would continue to get automated EMMI-PNA post discharge calls to assess how they are doing following recent hospitalization and will receive a call from a nurse if any of their responses were abnormal. He voiced understanding.     Plan: RN CM will close case as no further interventions needed at this time.  Enzo Montgomery, RN,BSN,CCM [facility] Management Telephonic Care Management Coordinator Direct Phone: [phone] Toll Free: [phone] Fax: [phone]

## 2019-04-13 ENCOUNTER — Other Ambulatory Visit: Payer: Self-pay

## 2019-04-13 ENCOUNTER — Encounter: Payer: Self-pay | Admitting: Neurology

## 2019-04-13 ENCOUNTER — Ambulatory Visit (INDEPENDENT_AMBULATORY_CARE_PROVIDER_SITE_OTHER): Payer: Medicare Other | Admitting: Neurology

## 2019-04-13 DIAGNOSIS — T50901D Poisoning by unspecified drugs, medicaments and biological substances, accidental (unintentional), subsequent encounter: Secondary | ICD-10-CM

## 2019-04-13 DIAGNOSIS — N390 Urinary tract infection, site not specified: Secondary | ICD-10-CM | POA: Diagnosis not present

## 2019-04-13 DIAGNOSIS — J989 Respiratory disorder, unspecified: Secondary | ICD-10-CM | POA: Diagnosis not present

## 2019-04-13 DIAGNOSIS — F0390 Unspecified dementia without behavioral disturbance: Secondary | ICD-10-CM

## 2019-04-13 DIAGNOSIS — B952 Enterococcus as the cause of diseases classified elsewhere: Secondary | ICD-10-CM

## 2019-04-13 DIAGNOSIS — G40301 Generalized idiopathic epilepsy and epileptic syndromes, not intractable, with status epilepticus: Secondary | ICD-10-CM | POA: Diagnosis not present

## 2019-04-13 DIAGNOSIS — R509 Fever, unspecified: Secondary | ICD-10-CM

## 2019-04-13 DIAGNOSIS — T50901A Poisoning by unspecified drugs, medicaments and biological substances, accidental (unintentional), initial encounter: Secondary | ICD-10-CM | POA: Insufficient documentation

## 2019-04-13 DIAGNOSIS — R569 Unspecified convulsions: Secondary | ICD-10-CM | POA: Insufficient documentation

## 2019-04-13 MED ORDER — PHENYTOIN SODIUM EXTENDED 100 MG PO CAPS: 300.0000 mg | ORAL_CAPSULE | Freq: Every day | ORAL | 3 refills | Status: AC

## 2019-04-13 MED ORDER — MELATONIN 3-2 MG PO TABS: 3.0000 mg | ORAL_TABLET | Freq: Every day | ORAL | 0 refills | Status: AC

## 2019-04-13 MED ORDER — DONEPEZIL HCL 10 MG PO TABS: 10.0000 mg | ORAL_TABLET | Freq: Every day | ORAL | 3 refills | Status: AC

## 2019-04-13 NOTE — Patient Instructions (Signed)
Epilepsy  Epilepsy is when a person keeps having seizures. A seizure is unusual activity in the brain. A seizure can change how you think or behave, and it can make it hard to be aware of what is happening.  This condition can cause problems, such as:  · Falls, accidents, and injury.  · Depression.  · Poor memory.  · Sudden unexplained death in epilepsy (SUDEP). This is rare. Its cause is not known.  Most people with epilepsy lead normal lives.  Follow these instructions at home:  Medicines    · Take medicines only as told by your doctor.  · Avoid anything that may keep your medicine from working, such as alcohol.  Activity  · Get enough rest. Lack of sleep can make seizures more likely to occur.  · Follow your doctor’s advice about driving, swimming, and doing anything else that would be dangerous if you had a seizure.  Teaching others  Teach friends and family what to do if you have a seizure. They should:  · Lay you on the ground to prevent a fall.  · Cushion your head and body.  · Loosen any tight clothing around your neck.  · Turn you on your side.  · Stay with you until you are better.  · Not hold you down.  · Not put anything in your mouth.  · Know whether or not you need emergency care.  General instructions  · Avoid anything that causes you to have seizures.  · Keep a seizure diary. Write down what you remember about each seizure, and especially what might have caused it.  · Keep all follow-up visits as told by your doctor. This is important.  Contact a doctor if:  · You have a change in your seizure pattern.  · You get an infection or start to feel sick. You may have more seizures when you are sick.  Get help right away if:  · A seizure does not stop after 5 minutes.  · You have more than one seizure in a row, and you do not have enough time between the seizures to feel better.  · A seizure makes it harder to breathe.  · A seizure is different from other seizures you have had.  · A seizure makes you unable  to speak or use a part of your body.  · You did not wake up right after a seizure.  This information is not intended to replace advice given to you by your health care provider. Make sure you discuss any questions you have with your health care provider.  Document Released: 09/23/2009 Document Revised: 07/02/2016 Document Reviewed: 06/05/2016  Elsevier Interactive Patient Education © 2019 [employer]vier Inc[employer]

## 2019-04-13 NOTE — Telephone Encounter (Signed)
Called and spoke with the patients son. He has the e-mail and is all set for the Doxy.me act. Verified chart was up to date

## 2019-04-13 NOTE — Progress Notes (Signed)
PATIENT: [name] DOB: 1951-04-21  Virtual Visit via Video Note  I connected with [name] on 04/13/19 at 11:00 AM EDT by a video enabled telemedicine application and verified that I am speaking with the correct person using two identifiers.  Location: Patient: at her home, living room- Provider: at [facility] office    I discussed the limitations of evaluation and management by telemedicine and the availability of in person appointments. The patient and her son expressed understanding and agreed to proceed.   Larey Seat, MD   HISTORY OF PRESENT ILLNESS:  04/13/19 :History of Present Illness:  [name] hat suffered a seizure on 03-24-2019,according to her son- After 2 hours of ongoing confusion, loss of awareness and staring off she presented to the ER and came back to awareness after she was given medication, hydration and oxygen.  Her son who is present during this video visit, reports that she was admitted with ongoing confusion-not  actively convulsive.   Once this "seizure" was treated it was found that she had a viral infection -she has had significant diarrhea was severely dehydrated and developed a UTI in the follow-up.  She was staying for several days in the hospital then released to a rehab unit and now returned home.   Just yesterday on 3 May she completed her antibiotic regimen.  Her Dilantin dose was increased, she continues to take Effexor, Namenda, Aricept, Celexa and is on Plavix.   Her son states that she is no longer taking any Ambien or other sleep aid only melatonin which seems to work rather well.  She has been walking with a cane and is using her oxygen 24/7 which further hampers somewhat her ambulation.  Her bedtime now is between 9 and 10 PM she falls asleep in 30 minutes she sleeps on multiple pillows at least 3 to prop her head up and she sleeps with oxygen.  She wakes up between 3 and 4 AM after usually 5 to 6 hours of total sleep time and feels that melatonin  has helped.  Her son stated that she was much more alert since she is on 602 for the treatment of COPD related hypoxemia.  The trouble resting at night began when she was beginning to become confused.  Several recent laboratory tests have been copied into this note. 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                                                                 Hospital Course   Accidental salicylate/NSAID overdose. - Most likely unintentional given patient's mental status. - On admission treated with sodium bicarb, clinically resolved  Acute hypoxemic respiratory failuredue to acute on chronic diastolic CHF with EF 61% along with possible viral pneumonia.  Was tested and ruled out for COVID-19 infection x2, CT chest noted, d-dimer and CRP were elevated but normal ferritin.  BNP was sky high.   He was finally diagnosed with acute on chronic diastolic CHF and aggressively diuresed with excellent results, she has transitioned from nonrebreather to 2 L nasal cannula, laying flat and in no distress, will be discharged on a diuretic regimen and fluid restriction at home with close PCP and outpatient cardiology follow-up, will qualify for 2 L nasal cannula oxygen at this time.  Chronic obstructive pulmonary disease -We will no wheezing. Supportive care.  Alzheimer's dementia/Hx CVA/hospital delirium? -Continue home dementia medications. At risk for delirium.  Seizure disorder -Continue home medications  Acute on CKD stage III  Avoid  nephrotoxins with gentle hydration renal function improving. Appears to be close to 1.5 - 1.7.  Essential HTN -Stable placed on Coreg hydralazine Imdur and diuretic regimen  Abdominal pain - KUB negative for ileus or obstruction - N.p.o.  Anemia unspecified -Anemia of chronic disease status post 1 unit of transfusion on 03/27/2019. Stable H&H continue to monitor.  Goals of care - 4/16 palliative care consult: Demented patient with multiple medical problems consider DNR status change       [name] is a 68 year old female with a history of dementia, insomnia and seizures.  She returns today for follow-up.  She currently lives at home with her son.  Her husband passed away last year.  She is able to complete all ADLs independently.  She no longer does any cooking.  Reports that she does not sleep well.  She wakes up frequently at night.  She does not sleep during the day.  Her son manages her finances, medications and appointments.  He also has a friend that helps.  The patient is never left at home alone.  The patient does not operate a motor vehicle.  She only travels outside of the home when she is with her son.  The patient and her son feel that she is more depressed since her husband passed.  She is able to feed herself.  She remains on oxycodone for back pain related to bladder infections and kidney stone.  The patient reports that she has not had any recent seizures.  Her son reports that her last seizure was in December 2018.  He feels that this may be related to the death of her husband.  She remains on Dilantin 200 mg at bedtime.  In the past she has tried Unisom for sleep but it did not really offer her much benefit.  She returns today for evaluation.   HISTORY June 04 2017 [name] is a 68 year old female with a history of dementia, insomnia and seizures. She returns today for follow-up. She reports that she feels that her memory may be slightly worse. She is currently living  with her granddaughter. She is able to complete all ADLs independently. She does require some assistance getting in and out of the bathtub. She does not operate a motor vehicle. She no longer handles her own finances or does any cooking. She remains on Aricept and Namenda and is tolerating those medications well. She is on Dilantin 200 mg at bedtime. She denies any seizure events. She has had ongoing trouble with her gait and balance. She uses a cane when ambulating. Granddaughter reports that she had a fall one month ago. She suffered an abrasion over the left eye. The patient admits that she does not always use her cane. She does have an indwelling catheter. She returns today for an evaluation.  REVIEW OF SYSTEMS: Out of a complete 14 system  review of symptoms, the patient complains only of the following symptoms, and all other reviewed systems are negative.  Activity change, appetite change, fatigue, memory loss, headache, speech difficulty, facial, behavior problem, confusion, depression,  walking difficulty, itching.  Insomnia improved on melatonin and no longer taking ambien. She ois more alrrt on oxygen and of ambien or remeron.   ALLERGIES: Allergies  Allergen Reactions   Codeine Nausea And Vomiting    REACTION: intolerance   Ace Inhibitors    Latex Other (See Comments)    itching    HOME MEDICATIONS: Outpatient Medications Prior to Visit  Medication Sig Dispense Refill   albuterol (PROVENTIL) (2.5 MG/3ML) 0.083% nebulizer solution Take 2.5 mg by nebulization every 6 (six) hours as needed for wheezing or shortness of breath.     atorvastatin (LIPITOR) 20 MG tablet Take 20 mg by mouth daily.     carvedilol (COREG) 6.25 MG tablet Take 6.25 mg by mouth 2 (two) times daily.     donepezil (ARICEPT) 10 MG tablet TAKE 1 BY MOUTH AT BEDTIME (Patient taking differently: Take 10 mg by mouth at bedtime. ) 90 tablet 1   doxazosin (CARDURA) 2 MG tablet Take 2 mg by mouth daily.      ferrous sulfate 325 (65 FE) MG tablet Take 1 tablet (325 mg total) by mouth 2 (two) times daily with a meal. 60 tablet 0   furosemide (LASIX) 40 MG tablet Take 1 tablet (40 mg total) by mouth daily. 30 tablet 0   hydrALAZINE (APRESOLINE) 50 MG tablet Take 1 tablet (50 mg total) by mouth 3 (three) times daily for 30 days. 90 tablet 0   hydrOXYzine (ATARAX/VISTARIL) 25 MG tablet Take 12.5 mg by mouth daily as needed for anxiety.     isosorbide mononitrate (IMDUR) 30 MG 24 hr tablet Take 1 tablet (30 mg total) by mouth daily for 30 days. 30 tablet 0   levETIRAcetam (KEPPRA) 500 MG tablet Take 500 mg by mouth 2 (two) times daily.     mirtazapine (REMERON) 45 MG tablet Take 1 tablet by mouth at bedtime.     ondansetron (ZOFRAN-ODT) 4 MG disintegrating tablet Take 4 mg by mouth every 6 (six) hours as needed for nausea or vomiting.      phenytoin (DILANTIN) 100 MG ER capsule Take 2 capsules (200 mg total) by mouth at bedtime. (Patient taking differently: Take 300 mg by mouth at bedtime. ) 180 capsule 3   sodium bicarbonate 650 MG tablet Take 1 tablet (650 mg total) by mouth 3 (three) times daily. (Patient taking differently: Take 650 mg by mouth daily. ) 90 tablet 0   spironolactone (ALDACTONE) 25 MG tablet Take 1 tablet (25 mg total) by mouth daily for 30 days. 30 tablet 0   No facility-administered medications prior to visit.     PAST MEDICAL HISTORY: Past Medical History:  Diagnosis Date   Acute renal failure superimposed on stage 3 chronic kidney disease ([place]) 03/26/2019   Alzheimer's dementia ([place])    takes Aricept nightly   Alzheimer's dementia ([place]) 03/26/2019   Anemia    Anemia, unspecified 03/27/2019   COPD (chronic obstructive pulmonary disease) (H[facility])    COPD with acute exacerbation ([place]) 03/26/2019   Delirium 03/27/2019   History of blood transfusion    received 2units on Feb 17,2015   History of MRSA infection    several yrs ago   HLD (hyperlipidemia) 06/06/2018    HTN (hypertension) 06/06/2018   Hyperlipidemia    takes Atorvastatin daily  Hypertension    takes Amlodipine and Carvedilol daily   Insomnia    takes Ambien nightly   Memory loss    Renal disorder    kidney stones   Seizures ([place])    Stroke ([place])    Subacute delirium 10/14/2013   takes Seroquel and Dilantin nightly   Toxic encephalopathy    UTI (lower urinary tract infection)    takes Macrodantin daily    PAST SURGICAL HISTORY: Past Surgical History:  Procedure Laterality Date   ABDOMINAL ANGIOGRAM N/A 01/20/2014   Procedure: ABDOMINAL ANGIOGRAM;  Surgeon: Elam Dutch, MD;  Location: [facility] CATH LAB;  Service: Cardiovascular;  Laterality: N/A;   ABDOMINAL HYSTERECTOMY     AMPUTATION Left 02/08/2014   Procedure: AMPUTATION Left 4th & 5th toes;  Surgeon: Conrad Boronda, MD;  Location: [facility];  Service: Vascular;  Laterality: Left;   BLADDER SURGERY     [place] Bladder   ERCP N/A 10/24/2018   Procedure: ENDOSCOPIC RETROGRADE CHOLANGIOPANCREATOGRAPHY (ERCP);  Surgeon: Clarene Essex, MD;  Location: [facility];  Service: Endoscopy;  Laterality: N/A;   INSERTION OF ILIAC STENT Left 01/26/2014   Procedure: INSERTION OF ILIAC STENT- LEFT COMMON ILIAC ARTERY STENT ;  Surgeon: Conrad Groveland Station, MD;  Location: [facility] OR;  Service: Vascular;  Laterality: Left;   KNEE SURGERY     PANCREATIC STENT PLACEMENT  10/24/2018   Procedure: PANCREATIC STENT PLACEMENT;  Surgeon: Clarene Essex, MD;  Location: [facility] ENDOSCOPY;  Service: Endoscopy;;   REMOVAL OF STONES  10/24/2018   Procedure: REMOVAL OF STONES;  Surgeon: Clarene Essex, MD;  Location: [facility] ENDOSCOPY;  Service: Endoscopy;;  balloon sweep   SPHINCTEROTOMY  10/24/2018   Procedure: Joan Mayans;  Surgeon: Clarene Essex, MD;  Location: [facility] ENDOSCOPY;  Service: Endoscopy;;    FAMILY HISTORY: Family History  Problem Relation Age of Onset   Cancer Brother     SOCIAL HISTORY: Social History   Socioeconomic History     Marital status: Married    Spouse name: Not on file   Number of children: Not on file   Years of education: 11   Highest education level: Not on file  Occupational History   Not on file  Social Needs   Financial resource strain: Not on file   Food insecurity:    Worry: Not on file    Inability: Not on file   Transportation needs:    Medical: Not on file    Non-medical: Not on file  Tobacco Use   Smoking status: Current Every Day Smoker    Packs/day: 1.00    Types: Cigarettes   Smokeless tobacco: Never Used  Substance and Sexual Activity   Alcohol use: No   Drug use: No   Sexual activity: Not on file  Lifestyle   Physical activity:    Days per week: Not on file    Minutes per session: Not on file   Stress: Not on file  Relationships   Social connections:    Talks on phone: Not on file    Gets together: Not on file    Attends religious service: Not on file    Active member of club or organization: Not on file    Attends meetings of clubs or organizations: Not on file    Relationship status: Not on file   Intimate partner violence:    Fear of current or ex partner: Not on file    Emotionally abused: Not on file  Physically abused: Not on file    Forced sexual activity: Not on file  Other Topics Concern   Not on file  Social History Narrative   10/13/18 Living with son, Larene Beach at home.  Caregiver during the day.      PHYSICAL EXAM  There were no vitals filed for this visit. There is no height or weight on file to calculate BMI. Generalized: patient appears frail, pale and is groomed. Poverty of speech, all questions answered by her son.   OBSERVATION.  Mentation: Alert.. Follows all commands speech and language fluent Cranial nerve : reports no loss of taste or smell-  Pupils were equal round , full EOM. Facial strength is symmetric.Marland Kitchen Uvula lifts in midline, tongue moves in midline, no tongue-bite was noted.Head turning and shoulder shrug   were symmetric. Motor: ROM for neck, shoulders and upper extremities intact.  Coordination:finger-movements are fluent, but she cannot follow 2 step commands easily. Gait and station: Gait is unsteady.  She uses a cane when ambulating.  Tandem gait not attempted on video. Reflexes:deferred.   DIAGNOSTIC DATA (LABS, IMAGING, TESTING) - I reviewed patient records, labs, notes, testing and imaging myself where available.  Lab Results  Component Value Date   WBC 8.1 03/29/2019   HGB 8.6 (L) 03/29/2019   HCT 28.6 (L) 03/29/2019   MCV 99.3 03/29/2019   PLT 103 (L) 03/29/2019      Component Value Date/Time   NA 140 03/30/2019 0336   NA 138 10/13/2018 1127   K 4.3 03/30/2019 0336   CL 104 03/30/2019 0336   CO2 33 (H) 03/30/2019 0336   GLUCOSE 89 03/30/2019 0336   BUN 13 03/30/2019 0336   BUN 15 10/13/2018 1127   CREATININE 1.23 (H) 03/30/2019 0336   CALCIUM 7.0 (L) 03/30/2019 0336   PROT 4.4 (L) 03/30/2019 0336   PROT 7.1 10/13/2018 1127   ALBUMIN 1.8 (L) 03/30/2019 0336   ALBUMIN 4.1 10/13/2018 1127   AST 10 (L) 03/30/2019 0336   ALT 6 03/30/2019 0336   ALKPHOS 46 03/30/2019 0336   BILITOT 0.5 03/30/2019 0336   BILITOT <0.2 10/13/2018 1127   GFRNONAA 45 (L) 03/30/2019 0336   GFRAA 53 (L) 03/30/2019 0336   Lab Results  Component Value Date   TRIG 224 (H) 11/23/2017   No results found for: HGBA1C Lab Results  Component Value Date   VITAMINB12 215 03/27/2019   Lab Results  Component Value Date   TSH 1.68 10/28/2008       ASSESSMENT AND PLAN:  History of Present Illness:  Patient has been followed for cognitive decline for many years, she has microvascular disease, confusion, COPD and was again confirmed to have anaemia, she also has now CKD 2.  To my knowledge this is her first evidence of status epilepticus.   The patient has been living in a multigenerational household, there is a lot of family support, but the family is also socioeconomic is struggling.  I am  most glad that melatonin seems to have replaced any prescription drug helping her to induce sleep, that is very good news and that she remains on oxygen without fighting the nasal cannula.  The increase from 200 to 300 mg Dilantin in a patient that is somewhat malnourished and frequently has tested for low albumin levels and now for a decreasing renal clearance gives concern- I think our next test should be for levels of Dilantin, free and bound. I could not find evidence of Keppra was continued or not.  Follow Up Instructions: Please have labs drawn locally for CMET repeat , and phenytoin levels, free and bound, in AM . Next visit in 3-6 month with NP and dedicated to memory testing, please adjust to patients educational level.   I discussed the assessment and treatment plan with the patient. The patient was provided an opportunity to ask questions and all were answered. The patient agreed with the plan and demonstrated an understanding of the instructions.   The patient was advised to call back or seek an in-person evaluation if the symptoms worsen or if the condition fails to improve as anticipated.  I provided 21 minutes of non-face-to-face time during this encounter.  Larey Seat, MD   04/13/2019, 11:18 AM [facility] [address] [place], [place] [address] [phone]

## 2019-04-20 ENCOUNTER — Other Ambulatory Visit: Payer: Self-pay | Admitting: Pharmacist

## 2019-04-20 NOTE — Patient Outreach (Signed)
[facility]) Care Management  [place]   04/20/2019  [name] Dec 15, 1950 [id number]  Subjective: Patient was called regarding medication review post discharge. HIPAA identifiers were obtained. Patient reported feeling much better and requested that I speak with her caregiver, Gerald Stabs.   She is a 68 year old female with multiple medical conditions including but not limited to:  Dementia, CKD stage III, hypertension, COPD, hyperlipidemia, PVD with history of toe amputation, and history of hemicolectomy.  Patient was recently hospitalized for CHF.  Objective:   Encounter Medications: Outpatient Encounter Medications as of 04/20/2019  Medication Sig  . albuterol (PROVENTIL) (2.5 MG/3ML) 0.083% nebulizer solution Take 2.5 mg by nebulization every 6 (six) hours as needed for wheezing or shortness of breath.  Marland Kitchen atorvastatin (LIPITOR) 20 MG tablet Take 20 mg by mouth daily.  . carvedilol (COREG) 6.25 MG tablet Take 6.25 mg by mouth 2 (two) times daily.  Marland Kitchen donepezil (ARICEPT) 10 MG tablet Take 1 tablet (10 mg total) by mouth at bedtime.  Marland Kitchen doxazosin (CARDURA) 2 MG tablet Take 2 mg by mouth daily.  . ferrous sulfate 325 (65 FE) MG tablet Take 1 tablet (325 mg total) by mouth 2 (two) times daily with a meal.  . furosemide (LASIX) 40 MG tablet Take 1 tablet (40 mg total) by mouth daily.  . hydrALAZINE (APRESOLINE) 50 MG tablet Take 1 tablet (50 mg total) by mouth 3 (three) times daily for 30 days.  . hydrOXYzine (ATARAX/VISTARIL) 25 MG tablet Take 12.5 mg by mouth daily as needed for anxiety.  . isosorbide mononitrate (IMDUR) 30 MG 24 hr tablet Take 1 tablet (30 mg total) by mouth daily for 30 days.  Marland Kitchen levETIRAcetam (KEPPRA) 500 MG tablet Take 500 mg by mouth 2 (two) times daily.  . Melatonin 3-2 MG TABS Take 3 mg by mouth at bedtime.  . mirtazapine (REMERON) 45 MG tablet Take 1 tablet by mouth at bedtime.  . ondansetron (ZOFRAN-ODT) 4 MG disintegrating tablet Take 4 mg  by mouth every 6 (six) hours as needed for nausea or vomiting.   . phenytoin (DILANTIN) 100 MG ER capsule Take 3 capsules (300 mg total) by mouth at bedtime.  . sodium bicarbonate 650 MG tablet Take 1 tablet (650 mg total) by mouth 3 (three) times daily. (Patient taking differently: Take 650 mg by mouth daily. )  . spironolactone (ALDACTONE) 25 MG tablet Take 1 tablet (25 mg total) by mouth daily for 30 days.   No facility-administered encounter medications on file as of 04/20/2019.     ASSESSMENT: Date Discharged from Hospital: 03/30/2019 Date Medication Reconciliation Performed: 04/20/2019  Medications:  New at Discharge: . furosemide (Lasix) . hydrALAZINE (APRESOLINE) . isosorbide mononitrate (IMDUR) . spironolactone (Aldactone)   Discontinued at Discharge:   amlodipine 10 MG tablet (NORVASC)  cephalexin 500 MG capsule (KEFLEX)  diclofenac sodium 1 % Gel (VOLTAREN)  Patient was recently discharged from hospital and all medications have been reviewed.   Drugs sorted by system:  Neurologic/Psychologic: Donepezil, Hydroxyzine, Levetiracetam, Mirtazapine, Phenytoin  Cardiovascular: Atorvastatin, Carvedilol, Doxazosin, Furosemide, Hydralazine, Isosorbide Mononitrate, Spironolactone  Pulmonary/Allergy: Albuterol Nebulizer Solution, Oxygen    Gastrointestinal: Ondansetron ODT,   Vitamins/Minerals: Ferrous Sulfate, Melatonin, Sodium Bicarbonate,    Medication Review Findings;  Risk of CNS depression/drowsiness Levetiracetam/donepezil/Phenytoin/mirtazapine  Patient's caregiver did not have any other questions and said the patient had everything she needed.  She was previously on a LAMA and our office was working on a Stiolto/Anoro switch. Patient was not discharged  on an inhaler.   Plan:  Close patient's case. Send closure letters to patient and provider.  [name], PharmD, [facility] Clinical Pharmacist [phone]

## 2019-04-22 ENCOUNTER — Telehealth: Payer: Self-pay | Admitting: Neurology

## 2019-04-22 DIAGNOSIS — L299 Pruritus, unspecified: Secondary | ICD-10-CM | POA: Diagnosis not present

## 2019-04-22 DIAGNOSIS — I5032 Chronic diastolic (congestive) heart failure: Secondary | ICD-10-CM | POA: Diagnosis not present

## 2019-04-22 DIAGNOSIS — D500 Iron deficiency anemia secondary to blood loss (chronic): Secondary | ICD-10-CM | POA: Diagnosis not present

## 2019-04-22 NOTE — Telephone Encounter (Signed)
Called and the pt caretaker answered, LVM for the son to call back and schedule the pt in 3-4 mths with Hedwig Morton, NP

## 2019-04-23 ENCOUNTER — Telehealth: Payer: Self-pay | Admitting: *Deleted

## 2019-04-23 DIAGNOSIS — N390 Urinary tract infection, site not specified: Secondary | ICD-10-CM | POA: Diagnosis not present

## 2019-04-23 DIAGNOSIS — R109 Unspecified abdominal pain: Secondary | ICD-10-CM | POA: Diagnosis not present

## 2019-04-23 DIAGNOSIS — N135 Crossing vessel and stricture of ureter without hydronephrosis: Secondary | ICD-10-CM | POA: Diagnosis not present

## 2019-04-23 NOTE — Telephone Encounter (Signed)
YOUR CARDIOLOGY TEAM HAS ARRANGED FOR AN E-VISIT FOR YOUR APPOINTMENT - PLEASE REVIEW IMPORTANT INFORMATION BELOW SEVERAL DAYS PRIOR TO YOUR APPOINTMENT  Due to the recent COVID-19 pandemic, we are transitioning in-person office visits to tele-medicine visits in an effort to decrease unnecessary exposure to our patients, their families, and staff. These visits are billed to your insurance just like a normal visit is. We also encourage you to sign up for MyChart if you have not already done so. You will need a smartphone if possible. For patients that do not have this, we can still complete the visit using a regular telephone but do prefer a smartphone to enable video when possible. You may have a family member that lives with you that can help. If possible, we also ask that you have a blood pressure cuff and scale at home to measure your blood pressure, heart rate and weight prior to your scheduled appointment. Patients with clinical needs that need an in-person evaluation and testing will still be able to come to the office if absolutely necessary. If you have any questions, feel free to call our office.     YOUR PROVIDER WILL BE USING THE FOLLOWING PLATFORM TO COMPLETE YOUR VISIT: Staff: Please delete this text and fill in MyChart/Doximity/Doxy.Me  . IF USING MYCHART - How to Download the MyChart App to Your SmartPhone   - If Apple, go to App Store and type in MyChart in the search bar and download the app. If Android, ask patient to go to Google Play Store and type in MyChart in the search bar and download the app. The app is free but as with any other app downloads, your phone may require you to verify saved payment information or Apple/Android password.  - You will need to then log into the app with your MyChart username and password, and select [facility] as your healthcare provider to link the account.  - When it is time for your visit, go to the MyChart app, find appointments, and click Begin  Video Visit. Be sure to Select Allow for your device to access the Microphone and Camera for your visit. You will then be connected, and your provider will be with you shortly.  **If you have any issues connecting or need assistance, please contact MyChart service desk [phone] ([phone])**  **If using a computer, in order to ensure the best quality for your visit, you will need to use either of the following Internet Browsers: Google Chrome or Microsoft Edge**  . IF USING DOXIMITY or DOXY.ME - The staff will give you instructions on receiving your link to join the meeting the day of your visit.      2-3 DAYS BEFORE YOUR APPOINTMENT  You will receive a telephone call from one of our [facility] team members - your caller ID may say "Unknown caller." If this is a video visit, we will walk you through how to get the video launched on your phone. We will remind you check your blood pressure, heart rate and weight prior to your scheduled appointment. If you have an Apple Watch or Kardia, please upload any pertinent ECG strips the day before or morning of your appointment to MyChart. Our staff will also make sure you have reviewed the consent and agree to move forward with your scheduled tele-health visit.     THE DAY OF YOUR APPOINTMENT  Approximately 15 minutes prior to your scheduled appointment, you will receive a telephone call from one of HeartCare team -   your caller ID may say "Unknown caller."  Our staff will confirm medications, vital signs for the day and any symptoms you may be experiencing. Please have this information available prior to the time of visit start. It may also be helpful for you to have a pad of paper and pen handy for any instructions given during your visit. They will also walk you through joining the smartphone meeting if this is a video visit.    CONSENT FOR TELE-HEALTH VISIT - PLEASE REVIEW  I hereby voluntarily request, consent and authorize [facility] and  its employed or contracted physicians, physician assistants, nurse practitioners or other licensed health care professionals (the Practitioner), to provide me with telemedicine health care services (the "Services") as deemed necessary by the treating Practitioner. I acknowledge and consent to receive the Services by the Practitioner via telemedicine. I understand that the telemedicine visit will involve communicating with the Practitioner through live audiovisual communication technology and the disclosure of certain medical information by electronic transmission. I acknowledge that I have been given the opportunity to request an in-person assessment or other available alternative prior to the telemedicine visit and am voluntarily participating in the telemedicine visit.  I understand that I have the right to withhold or withdraw my consent to the use of telemedicine in the course of my care at any time, without affecting my right to future care or treatment, and that the Practitioner or I may terminate the telemedicine visit at any time. I understand that I have the right to inspect all information obtained and/or recorded in the course of the telemedicine visit and may receive copies of available information for a reasonable fee.  I understand that some of the potential risks of receiving the Services via telemedicine include:  Marland Kitchen Delay or interruption in medical evaluation due to technological equipment failure or disruption; . Information transmitted may not be sufficient (e.g. poor resolution of images) to allow for appropriate medical decision making by the Practitioner; and/or  . In rare instances, security protocols could fail, causing a breach of personal health information.  Furthermore, I acknowledge that it is my responsibility to provide information about my medical history, conditions and care that is complete and accurate to the best of my ability. I acknowledge that Practitioner's advice,  recommendations, and/or decision may be based on factors not within their control, such as incomplete or inaccurate data provided by me or distortions of diagnostic images or specimens that may result from electronic transmissions. I understand that the practice of medicine is not an exact science and that Practitioner makes no warranties or guarantees regarding treatment outcomes. I acknowledge that I will receive a copy of this consent concurrently upon execution via email to the email address I last provided but may also request a printed copy by calling the office of [place].    I understand that my insurance will be billed for this visit.   I have read or had this consent read to me. . I understand the contents of this consent, which adequately explains the benefits and risks of the Services being provided via telemedicine.  . I have been provided ample opportunity to ask questions regarding this consent and the Services and have had my questions answered to my satisfaction. . I give my informed consent for the services to be provided through the use of telemedicine in my medical care  By participating in this telemedicine visit I agree to the Pt gives consent. above.    COVID-19  Pre-Screening Questions:  . In the past 7 to 10 days have you had a cough,  shortness of breath, headache, congestion, fever, body aches, chills, sore throat, or sudden loss of taste or sense of smell?No . Have you been around anyone with known Covid 19.No . Have you been around anyone who is awaiting Covid 19 test results in the past 7 to 10 days?NoHave you been around anyone who has been exposed to Covid 19, or has mentioned symptoms of Covid 19 within the past 7 to 10 days?No . Pt was tested for Covid 19 and negative  If you have any concerns about symptoms your patients report please contact your leadership team, or the provider the patient is seeing in the office for further guidance.

## 2019-04-24 DIAGNOSIS — E43 Unspecified severe protein-calorie malnutrition: Secondary | ICD-10-CM | POA: Diagnosis not present

## 2019-04-24 DIAGNOSIS — M810 Age-related osteoporosis without current pathological fracture: Secondary | ICD-10-CM | POA: Diagnosis not present

## 2019-04-24 DIAGNOSIS — Z466 Encounter for fitting and adjustment of urinary device: Secondary | ICD-10-CM | POA: Diagnosis not present

## 2019-04-24 DIAGNOSIS — I739 Peripheral vascular disease, unspecified: Secondary | ICD-10-CM | POA: Diagnosis not present

## 2019-04-24 DIAGNOSIS — N319 Neuromuscular dysfunction of bladder, unspecified: Secondary | ICD-10-CM | POA: Diagnosis not present

## 2019-04-24 DIAGNOSIS — K219 Gastro-esophageal reflux disease without esophagitis: Secondary | ICD-10-CM | POA: Diagnosis not present

## 2019-04-24 DIAGNOSIS — N179 Acute kidney failure, unspecified: Secondary | ICD-10-CM | POA: Diagnosis not present

## 2019-04-24 DIAGNOSIS — J9601 Acute respiratory failure with hypoxia: Secondary | ICD-10-CM | POA: Diagnosis not present

## 2019-04-24 DIAGNOSIS — D689 Coagulation defect, unspecified: Secondary | ICD-10-CM | POA: Diagnosis not present

## 2019-04-24 DIAGNOSIS — D631 Anemia in chronic kidney disease: Secondary | ICD-10-CM | POA: Diagnosis not present

## 2019-04-24 DIAGNOSIS — N183 Chronic kidney disease, stage 3 (moderate): Secondary | ICD-10-CM | POA: Diagnosis not present

## 2019-04-24 DIAGNOSIS — G300 Alzheimer's disease with early onset: Secondary | ICD-10-CM | POA: Diagnosis not present

## 2019-04-24 DIAGNOSIS — I5033 Acute on chronic diastolic (congestive) heart failure: Secondary | ICD-10-CM | POA: Diagnosis not present

## 2019-04-24 DIAGNOSIS — N390 Urinary tract infection, site not specified: Secondary | ICD-10-CM | POA: Diagnosis not present

## 2019-04-24 DIAGNOSIS — Z8673 Personal history of transient ischemic attack (TIA), and cerebral infarction without residual deficits: Secondary | ICD-10-CM | POA: Diagnosis not present

## 2019-04-24 DIAGNOSIS — I130 Hypertensive heart and chronic kidney disease with heart failure and stage 1 through stage 4 chronic kidney disease, or unspecified chronic kidney disease: Secondary | ICD-10-CM | POA: Diagnosis not present

## 2019-04-24 DIAGNOSIS — D696 Thrombocytopenia, unspecified: Secondary | ICD-10-CM | POA: Diagnosis not present

## 2019-04-24 DIAGNOSIS — I7774 Dissection of vertebral artery: Secondary | ICD-10-CM | POA: Diagnosis not present

## 2019-04-24 DIAGNOSIS — T39391D Poisoning by other nonsteroidal anti-inflammatory drugs [NSAID], accidental (unintentional), subsequent encounter: Secondary | ICD-10-CM | POA: Diagnosis not present

## 2019-04-24 DIAGNOSIS — J439 Emphysema, unspecified: Secondary | ICD-10-CM | POA: Diagnosis not present

## 2019-04-27 DIAGNOSIS — N289 Disorder of kidney and ureter, unspecified: Secondary | ICD-10-CM | POA: Diagnosis not present

## 2019-04-27 DIAGNOSIS — R52 Pain, unspecified: Secondary | ICD-10-CM | POA: Diagnosis not present

## 2019-04-28 ENCOUNTER — Telehealth (INDEPENDENT_AMBULATORY_CARE_PROVIDER_SITE_OTHER): Payer: Medicare Other | Admitting: Cardiology

## 2019-04-28 ENCOUNTER — Encounter: Payer: Self-pay | Admitting: Cardiology

## 2019-04-28 ENCOUNTER — Other Ambulatory Visit: Payer: Self-pay | Admitting: Emergency Medicine

## 2019-04-28 ENCOUNTER — Other Ambulatory Visit: Payer: Self-pay

## 2019-04-28 VITALS — Ht 60.0 in | Wt 88.0 lb

## 2019-04-28 DIAGNOSIS — J449 Chronic obstructive pulmonary disease, unspecified: Secondary | ICD-10-CM

## 2019-04-28 DIAGNOSIS — G309 Alzheimer's disease, unspecified: Secondary | ICD-10-CM

## 2019-04-28 DIAGNOSIS — F17210 Nicotine dependence, cigarettes, uncomplicated: Secondary | ICD-10-CM | POA: Diagnosis not present

## 2019-04-28 DIAGNOSIS — N289 Disorder of kidney and ureter, unspecified: Secondary | ICD-10-CM | POA: Diagnosis not present

## 2019-04-28 DIAGNOSIS — R0609 Other forms of dyspnea: Secondary | ICD-10-CM | POA: Diagnosis not present

## 2019-04-28 DIAGNOSIS — F17200 Nicotine dependence, unspecified, uncomplicated: Secondary | ICD-10-CM

## 2019-04-28 DIAGNOSIS — I10 Essential (primary) hypertension: Secondary | ICD-10-CM | POA: Diagnosis not present

## 2019-04-28 DIAGNOSIS — R0600 Dyspnea, unspecified: Secondary | ICD-10-CM

## 2019-04-28 MED ORDER — SPIRONOLACTONE 25 MG PO TABS: 25.0000 mg | ORAL_TABLET | Freq: Every day | ORAL | 1 refills | Status: DC

## 2019-04-28 MED ORDER — FUROSEMIDE 40 MG PO TABS: 40.0000 mg | ORAL_TABLET | Freq: Two times a day (BID) | ORAL | 2 refills | Status: DC

## 2019-04-28 MED ORDER — ISOSORBIDE MONONITRATE ER 30 MG PO TB24: 30.0000 mg | ORAL_TABLET | Freq: Every day | ORAL | 1 refills | Status: DC

## 2019-04-28 MED ORDER — SPIRONOLACTONE 25 MG PO TABS: 25.0000 mg | ORAL_TABLET | Freq: Every day | ORAL | 1 refills | Status: AC

## 2019-04-28 MED ORDER — HYDRALAZINE HCL 50 MG PO TABS: 50.0000 mg | ORAL_TABLET | Freq: Three times a day (TID) | ORAL | 1 refills | Status: DC

## 2019-04-28 NOTE — Addendum Note (Signed)
Addended by: Ashok Norris on: 04/28/2019 03:55 PM   Modules accepted: Orders

## 2019-04-28 NOTE — Addendum Note (Signed)
Addended by: Beckey Rutter on: 04/28/2019 12:05 PM   Modules accepted: Orders

## 2019-04-28 NOTE — Addendum Note (Signed)
Addended by: Ashok Norris on: 04/28/2019 03:48 PM   Modules accepted: Orders

## 2019-04-28 NOTE — Progress Notes (Signed)
Virtual Visit via Video Note   This visit type was conducted due to national recommendations for restrictions regarding the COVID-19 Pandemic (e.g. social distancing) in an effort to limit this patient's exposure and mitigate transmission in our community.  Due to her co-morbid illnesses, this patient is at least at moderate risk for complications without adequate follow up.  This format is felt to be most appropriate for this patient at this time.  All issues noted in this document were discussed and addressed.  A limited physical exam was performed with this format.  Please refer to the patient's chart for her consent to telehealth for [facility].   Date:  04/28/2019   ID:  [name], DOB 04-Apr-1951, MRN [id number]  Patient Location: Home Provider Location: Home  PCP:  Jeanie Sewer, NP  Cardiologist:  No primary care provider on file.  Electrophysiologist:  None   Evaluation Performed:  Follow-Up Visit  Chief Complaint:  Feet swelling and elevated BNP  History of Present Illness:    [name] is a 68 y.o. female with past medical history of essential hypertension and Alzheimer's disease.  She mentions to me that her primary care provider cut down her diuretics into half and she has had no swelling in the feet and burning sensation.  No chest pain orthopnea or PND.  At the time of my evaluation, the patient is alert awake oriented and in no distress.  The patient is evaluated by video conferencing and she looks very frail and when I was trying to call the doctor's office to get records and took a break in between and I came back she was actively smoking!!  The patient does not have symptoms concerning for COVID-19 infection (fever, chills, cough, or new shortness of breath).    Past Medical History:  Diagnosis Date  . Acute renal failure superimposed on stage 3 chronic kidney disease ([place]) 03/26/2019  . Alzheimer's dementia ([place])    takes Aricept nightly  . Alzheimer's  dementia ([place]) 03/26/2019  . Anemia   . Anemia, unspecified 03/27/2019  . COPD (chronic obstructive pulmonary disease) ([place])   . COPD with acute exacerbation ([place]) 03/26/2019  . Delirium 03/27/2019  . History of blood transfusion    received 2units on Feb 17,2015  . History of MRSA infection    several yrs ago  . HLD (hyperlipidemia) 06/06/2018  . HTN (hypertension) 06/06/2018  . Hyperlipidemia    takes Atorvastatin daily  . Hypertension    takes Amlodipine and Carvedilol daily  . Insomnia    takes Ambien nightly  . Memory loss   . Renal disorder    kidney stones  . Seizures ([place])   . Stroke ([place])   . Subacute delirium 10/14/2013   takes Seroquel and Dilantin nightly  . Toxic encephalopathy   . UTI (lower urinary tract infection)    takes Macrodantin daily   Past Surgical History:  Procedure Laterality Date  . ABDOMINAL ANGIOGRAM N/A 01/20/2014   Procedure: ABDOMINAL ANGIOGRAM;  Surgeon: Elam Dutch, MD;  Location: [facility] C[facility] LAB;  Service: Cardiovascular;  Laterality: N/A;  . ABDOMINAL HYSTERECTOMY    . AMPUTATION Left 02/08/2014   Procedure: AMPUTATION Left 4th & 5th toes;  Surgeon: Conrad Emmet, MD;  Location: [place];  Service: Vascular;  Laterality: Left;  . BLADDER SURGERY    . CHOLECYSTECTOMY  1995   Gall Bladder  . ERCP N/A 10/24/2018   Procedure: ENDOSCOPIC RETROGRADE CHOLANGIOPANCREATOGRAPHY (ERCP);  Surgeon: Clarene Essex,  MD;  Location: [place] ENDOSCOPY;  Service: Endoscopy;  Laterality: N/A;  . INSERTION OF ILIAC STENT Left 01/26/2014   Procedure: INSERTION OF ILIAC STENT- LEFT COMMON ILIAC ARTERY STENT ;  Surgeon: Conrad Edith Endave, MD;  Location: [place];  Service: Vascular;  Laterality: Left;  . KNEE SURGERY    . PANCREATIC STENT PLACEMENT  10/24/2018   Procedure: PANCREATIC STENT PLACEMENT;  Surgeon: Clarene Essex, MD;  Location: [place];  Service: Endoscopy;;  . REMOVAL OF STONES  10/24/2018   Procedure: REMOVAL OF STONES;  Surgeon: Clarene Essex, MD;  Location: [place];  Service: Endoscopy;;  balloon sweep  . SPHINCTEROTOMY  10/24/2018   Procedure: SPHINCTEROTOMY;  Surgeon: Clarene Essex, MD;  Location: [place];  Service: Endoscopy;;     Current Meds  Medication Sig  . albuterol (PROVENTIL) (2.5 MG/3ML) 0.083% nebulizer solution Take 2.5 mg by nebulization every 6 (six) hours as needed for wheezing or shortness of breath.  Marland Kitchen atorvastatin (LIPITOR) 20 MG tablet Take 20 mg by mouth daily.  . carvedilol (COREG) 6.25 MG tablet Take 6.25 mg by mouth 2 (two) times daily.  Marland Kitchen donepezil (ARICEPT) 10 MG tablet Take 1 tablet (10 mg total) by mouth at bedtime.  Marland Kitchen doxazosin (CARDURA) 2 MG tablet Take 2 mg by mouth daily.  . ferrous sulfate 325 (65 FE) MG tablet Take 1 tablet (325 mg total) by mouth 2 (two) times daily with a meal.  . furosemide (LASIX) 40 MG tablet Take 1 tablet (40 mg total) by mouth daily.  . hydrALAZINE (APRESOLINE) 50 MG tablet Take 1 tablet (50 mg total) by mouth 3 (three) times daily for 30 days.  . hydrOXYzine (ATARAX/VISTARIL) 25 MG tablet Take 12.5 mg by mouth daily as needed for anxiety.  . isosorbide mononitrate (IMDUR) 30 MG 24 hr tablet Take 1 tablet (30 mg total) by mouth daily for 30 days.  Marland Kitchen levETIRAcetam (KEPPRA) 500 MG tablet Take 500 mg by mouth 2 (two) times daily.  . Melatonin 3-2 MG TABS Take 3 mg by mouth at bedtime.  . mirtazapine (REMERON) 45 MG tablet Take 1 tablet by mouth at bedtime.  . ondansetron (ZOFRAN-ODT) 4 MG disintegrating tablet Take 4 mg by mouth every 6 (six) hours as needed for nausea or vomiting.   . OXYGEN Inhale 2 L into the lungs.  . phenytoin (DILANTIN) 100 MG ER capsule Take 3 capsules (300 mg total) by mouth at bedtime. (Patient taking differently: Take 200 mg by mouth at bedtime. )  . sodium bicarbonate 650 MG tablet Take 1 tablet (650 mg total) by mouth 3 (three) times daily. (Patient taking differently: Take 650 mg by mouth daily. )  . spironolactone (ALDACTONE) 25 MG tablet Take 1 tablet  (25 mg total) by mouth daily for 30 days.     Allergies:   Codeine; Ace inhibitors; and Latex   Social History   Tobacco Use  . Smoking status: Current Every Day Smoker    Packs/day: 1.00    Types: Cigarettes  . Smokeless tobacco: Never Used  Substance Use Topics  . Alcohol use: No  . Drug use: No     Family Hx: The patient's family history includes Cancer in her brother.  ROS:   Please see the history of present illness.    As mentioned above  All other systems reviewed and are negative.   Prior CV studies:   The following studies were reviewed today:  I reviewed records from [place] extensively  Labs/Other Tests  and Data Reviewed:    EKG:  [facility]KG from [facility] revealed sinus rhythm and nonspecific ST-T changes  Recent Labs: 03/28/2019: B Natriuretic Peptide 883.8 03/29/2019: Hemoglobin 8.6; Platelets 103 03/30/2019: ALT 6; BUN 13; Creatinine, Ser 1.23; Magnesium 2.1; Potassium 4.3; Sodium 140   Recent Lipid Panel Lab Results  Component Value Date/Time   TRIG 224 (H) 11/23/2017 06:12 AM    Wt Readings from Last 3 Encounters:  04/28/19 88 lb (39.9 kg)  03/27/19 102 lb 8.2 oz (46.5 kg)  10/23/18 99 lb 6.1 oz (45.1 kg)     Objective:    Vital Signs:  Ht 5' (1.524 m)   Wt 88 lb (39.9 kg)   BMI 17.19 kg/m    VITAL SIGNS:  reviewed  ASSESSMENT & PLAN:    1. Shortness of breath on exertion: Patient has very advanced COPD and Alzheimer's.  This could be contributing to her shortness of breath.  In addition it is possible that she has right-sided heart failure from chronic heavy smoking I discussed this issues with the patient at extensive length 2. Her BNP at the hospital was elevated it is possible that she has element of congestive heart failure and we will do an echocardiogram to assess this 3. Her blood pressure is stable at this time 4. In view of significant pedal edema I have asked her to increase her furosemide to 40 mg twice daily as she  was doing before 5. We will get her back for an echocardiogram in the next few days and at that time she will have a Chem-7 at her office also 6. She will be seen in follow-up appointment in 1 month or earlier if she has any concerns.  She knows to go to the nearest emergency room for any significant concerns. 7. I spent 5 minutes with the patient discussing solely about smoking. Smoking cessation was counseled. I suggested to the patient also different medications and pharmacological interventions. Patient is keen to try stopping on its own at this time. He will get back to me if he needs any further assistance in this matter.  COVID-19 Education: The signs and symptoms of COVID-19 were discussed with the patient and how to seek care for testing (follow up with PCP or arrange E-visit).  The importance of social distancing was discussed today.  Time:   Today, I have spent 45 minutes with the patient with telehealth technology discussing the above problems.     Medication Adjustments/Labs and Tests Ordered: Current medicines are reviewed at length with the patient today.  Concerns regarding medicines are outlined above.   Tests Ordered: No orders of the defined types were placed in this encounter.   Medication Changes: No orders of the defined types were placed in this encounter.   Disposition:  Follow up in 1 month(s)  Signed, Jenean Lindau, MD  04/28/2019 11:12 AM    [place]

## 2019-04-28 NOTE — Patient Instructions (Signed)
Medication Instructions:  Your physician has recommended you make the following change in your medication:   INCREASE furosemide to 40 mg 2x's per day  If you need a refill on your cardiac medications before your next appointment, please call your pharmacy.   Lab work: You will have a BMP drawn on May 14, 2019 at 1 pm prior to your echocardiogram appt.  If you have labs (blood work) drawn today and your tests are completely normal, you will receive your results only by: Marland Kitchen MyChart Message (if you have MyChart) OR . A paper copy in the mail If you have any lab test that is abnormal or we need to change your treatment, we will call you to review the results.  Testing/Procedures: Your physician has requested that you have an echocardiogram. YOU HAVE BEEN SCHEDULED for May 14, 2019 at 1:15 pm. Echocardiography is a painless test that uses sound waves to create images of your heart. It provides your doctor with information about the size and shape of your heart and how well your heart's chambers and valves are working. This procedure takes approximately one hour. There are no restrictions for this procedure.    Follow-Up: At [facility], you and your health needs are our priority.  As part of our continuing mission to provide you with exceptional heart care, we have created designated Provider Care Teams.  These Care Teams include your primary Cardiologist (physician) and Advanced Practice Providers (APPs -  Physician Assistants and Nurse Practitioners) who all work together to provide you with the care you need, when you need it. You will need a follow up appointment in 1 months.    Any Other Special Instructions Will Be Listed Below   Echocardiogram An echocardiogram is a procedure that uses painless sound waves (ultrasound) to produce an image of the heart. Images from an echocardiogram can provide important information about:  Signs of coronary artery disease (CAD).  Aneurysm  detection. An aneurysm is a weak or damaged part of an artery wall that bulges out from the normal force of blood pumping through the body.  Heart size and shape. Changes in the size or shape of the heart can be associated with certain conditions, including heart failure, aneurysm, and CAD.  Heart muscle function.  Heart valve function.  Signs of a past heart attack.  Fluid buildup around the heart.  Thickening of the heart muscle.  A tumor or infectious growth around the heart valves. Tell a health care provider about:  Any allergies you have.  All medicines you are taking, including vitamins, herbs, eye drops, creams, and over-the-counter medicines.  Any blood disorders you have.  Any surgeries you have had.  Any medical conditions you have.  Whether you are pregnant or may be pregnant. What are the risks? Generally, this is a safe procedure. However, problems may occur, including:  Allergic reaction to dye (contrast) that may be used during the procedure. What happens before the procedure? No specific preparation is needed. You may eat and drink normally. What happens during the procedure?   An IV tube may be inserted into one of your veins.  You may receive contrast through this tube. A contrast is an injection that improves the quality of the pictures from your heart.  A gel will be applied to your chest.  A wand-like tool (transducer) will be moved over your chest. The gel will help to transmit the sound waves from the transducer.  The sound waves will harmlessly bounce  off of your heart to allow the heart images to be captured in real-time motion. The images will be recorded on a computer. The procedure may vary among health care providers and hospitals. What happens after the procedure?  You may return to your normal, everyday life, including diet, activities, and medicines, unless your health care provider tells you not to do that. Summary  An  echocardiogram is a procedure that uses painless sound waves (ultrasound) to produce an image of the heart.  Images from an echocardiogram can provide important information about the size and shape of your heart, heart muscle function, heart valve function, and fluid buildup around your heart.  You do not need to do anything to prepare before this procedure. You may eat and drink normally.  After the echocardiogram is completed, you may return to your normal, everyday life, unless your health care provider tells you not to do that. This information is not intended to replace advice given to you by your health care provider. Make sure you discuss any questions you have with your health care provider. Document Released: 11/23/2000 Document Revised: 12/29/2016 Document Reviewed: 12/29/2016 Elsevier Interactive Patient Education  2019 [employer].

## 2019-04-29 DIAGNOSIS — I129 Hypertensive chronic kidney disease with stage 1 through stage 4 chronic kidney disease, or unspecified chronic kidney disease: Secondary | ICD-10-CM | POA: Diagnosis not present

## 2019-04-29 DIAGNOSIS — R0789 Other chest pain: Secondary | ICD-10-CM | POA: Diagnosis not present

## 2019-04-29 DIAGNOSIS — J449 Chronic obstructive pulmonary disease, unspecified: Secondary | ICD-10-CM

## 2019-04-29 DIAGNOSIS — R5381 Other malaise: Secondary | ICD-10-CM | POA: Diagnosis not present

## 2019-04-29 DIAGNOSIS — I10 Essential (primary) hypertension: Secondary | ICD-10-CM

## 2019-04-29 DIAGNOSIS — K219 Gastro-esophageal reflux disease without esophagitis: Secondary | ICD-10-CM | POA: Diagnosis not present

## 2019-04-29 DIAGNOSIS — R109 Unspecified abdominal pain: Secondary | ICD-10-CM

## 2019-04-29 DIAGNOSIS — E872 Acidosis: Secondary | ICD-10-CM

## 2019-04-29 DIAGNOSIS — E876 Hypokalemia: Secondary | ICD-10-CM | POA: Diagnosis not present

## 2019-04-29 DIAGNOSIS — N179 Acute kidney failure, unspecified: Secondary | ICD-10-CM

## 2019-04-29 DIAGNOSIS — N3000 Acute cystitis without hematuria: Secondary | ICD-10-CM | POA: Diagnosis not present

## 2019-04-29 DIAGNOSIS — D6489 Other specified anemias: Secondary | ICD-10-CM | POA: Diagnosis not present

## 2019-04-29 DIAGNOSIS — N183 Chronic kidney disease, stage 3 (moderate): Secondary | ICD-10-CM | POA: Diagnosis not present

## 2019-04-29 DIAGNOSIS — G309 Alzheimer's disease, unspecified: Secondary | ICD-10-CM

## 2019-04-29 DIAGNOSIS — R079 Chest pain, unspecified: Secondary | ICD-10-CM

## 2019-04-29 DIAGNOSIS — J189 Pneumonia, unspecified organism: Secondary | ICD-10-CM | POA: Diagnosis not present

## 2019-04-29 DIAGNOSIS — Z9981 Dependence on supplemental oxygen: Secondary | ICD-10-CM | POA: Diagnosis not present

## 2019-04-29 DIAGNOSIS — I959 Hypotension, unspecified: Secondary | ICD-10-CM | POA: Diagnosis not present

## 2019-04-29 DIAGNOSIS — E860 Dehydration: Secondary | ICD-10-CM | POA: Diagnosis not present

## 2019-04-29 DIAGNOSIS — R269 Unspecified abnormalities of gait and mobility: Secondary | ICD-10-CM | POA: Diagnosis not present

## 2019-04-29 DIAGNOSIS — B961 Klebsiella pneumoniae [K. pneumoniae] as the cause of diseases classified elsewhere: Secondary | ICD-10-CM | POA: Diagnosis not present

## 2019-04-29 DIAGNOSIS — Z79891 Long term (current) use of opiate analgesic: Secondary | ICD-10-CM | POA: Diagnosis not present

## 2019-04-29 DIAGNOSIS — R911 Solitary pulmonary nodule: Secondary | ICD-10-CM | POA: Diagnosis not present

## 2019-04-29 DIAGNOSIS — M1990 Unspecified osteoarthritis, unspecified site: Secondary | ICD-10-CM | POA: Diagnosis not present

## 2019-04-29 DIAGNOSIS — N390 Urinary tract infection, site not specified: Secondary | ICD-10-CM

## 2019-05-03 DIAGNOSIS — I7774 Dissection of vertebral artery: Secondary | ICD-10-CM | POA: Diagnosis not present

## 2019-05-03 DIAGNOSIS — E43 Unspecified severe protein-calorie malnutrition: Secondary | ICD-10-CM | POA: Diagnosis not present

## 2019-05-03 DIAGNOSIS — I5033 Acute on chronic diastolic (congestive) heart failure: Secondary | ICD-10-CM | POA: Diagnosis not present

## 2019-05-03 DIAGNOSIS — T39391D Poisoning by other nonsteroidal anti-inflammatory drugs [NSAID], accidental (unintentional), subsequent encounter: Secondary | ICD-10-CM | POA: Diagnosis not present

## 2019-05-03 DIAGNOSIS — N179 Acute kidney failure, unspecified: Secondary | ICD-10-CM | POA: Diagnosis not present

## 2019-05-03 DIAGNOSIS — G300 Alzheimer's disease with early onset: Secondary | ICD-10-CM | POA: Diagnosis not present

## 2019-05-03 DIAGNOSIS — D631 Anemia in chronic kidney disease: Secondary | ICD-10-CM | POA: Diagnosis not present

## 2019-05-03 DIAGNOSIS — D696 Thrombocytopenia, unspecified: Secondary | ICD-10-CM | POA: Diagnosis not present

## 2019-05-03 DIAGNOSIS — N183 Chronic kidney disease, stage 3 (moderate): Secondary | ICD-10-CM | POA: Diagnosis not present

## 2019-05-03 DIAGNOSIS — Z8673 Personal history of transient ischemic attack (TIA), and cerebral infarction without residual deficits: Secondary | ICD-10-CM | POA: Diagnosis not present

## 2019-05-03 DIAGNOSIS — M810 Age-related osteoporosis without current pathological fracture: Secondary | ICD-10-CM | POA: Diagnosis not present

## 2019-05-03 DIAGNOSIS — J439 Emphysema, unspecified: Secondary | ICD-10-CM | POA: Diagnosis not present

## 2019-05-03 DIAGNOSIS — I739 Peripheral vascular disease, unspecified: Secondary | ICD-10-CM | POA: Diagnosis not present

## 2019-05-03 DIAGNOSIS — N319 Neuromuscular dysfunction of bladder, unspecified: Secondary | ICD-10-CM | POA: Diagnosis not present

## 2019-05-03 DIAGNOSIS — N390 Urinary tract infection, site not specified: Secondary | ICD-10-CM | POA: Diagnosis not present

## 2019-05-03 DIAGNOSIS — K219 Gastro-esophageal reflux disease without esophagitis: Secondary | ICD-10-CM | POA: Diagnosis not present

## 2019-05-03 DIAGNOSIS — I130 Hypertensive heart and chronic kidney disease with heart failure and stage 1 through stage 4 chronic kidney disease, or unspecified chronic kidney disease: Secondary | ICD-10-CM | POA: Diagnosis not present

## 2019-05-03 DIAGNOSIS — Z466 Encounter for fitting and adjustment of urinary device: Secondary | ICD-10-CM | POA: Diagnosis not present

## 2019-05-03 DIAGNOSIS — D689 Coagulation defect, unspecified: Secondary | ICD-10-CM | POA: Diagnosis not present

## 2019-05-03 DIAGNOSIS — J9601 Acute respiratory failure with hypoxia: Secondary | ICD-10-CM | POA: Diagnosis not present

## 2019-05-05 DIAGNOSIS — T83511A Infection and inflammatory reaction due to indwelling urethral catheter, initial encounter: Secondary | ICD-10-CM | POA: Diagnosis not present

## 2019-05-05 DIAGNOSIS — I700 Atherosclerosis of aorta: Secondary | ICD-10-CM | POA: Diagnosis not present

## 2019-05-05 DIAGNOSIS — Z9689 Presence of other specified functional implants: Secondary | ICD-10-CM | POA: Diagnosis not present

## 2019-05-06 DIAGNOSIS — E785 Hyperlipidemia, unspecified: Secondary | ICD-10-CM | POA: Diagnosis not present

## 2019-05-06 DIAGNOSIS — N119 Chronic tubulo-interstitial nephritis, unspecified: Secondary | ICD-10-CM | POA: Diagnosis not present

## 2019-05-06 DIAGNOSIS — N390 Urinary tract infection, site not specified: Secondary | ICD-10-CM | POA: Diagnosis not present

## 2019-05-06 DIAGNOSIS — M1990 Unspecified osteoarthritis, unspecified site: Secondary | ICD-10-CM | POA: Diagnosis not present

## 2019-05-06 DIAGNOSIS — B952 Enterococcus as the cause of diseases classified elsewhere: Secondary | ICD-10-CM | POA: Diagnosis not present

## 2019-05-06 DIAGNOSIS — N131 Hydronephrosis with ureteral stricture, not elsewhere classified: Secondary | ICD-10-CM | POA: Diagnosis not present

## 2019-05-06 DIAGNOSIS — D638 Anemia in other chronic diseases classified elsewhere: Secondary | ICD-10-CM | POA: Diagnosis not present

## 2019-05-06 DIAGNOSIS — I129 Hypertensive chronic kidney disease with stage 1 through stage 4 chronic kidney disease, or unspecified chronic kidney disease: Secondary | ICD-10-CM | POA: Diagnosis not present

## 2019-05-06 DIAGNOSIS — I700 Atherosclerosis of aorta: Secondary | ICD-10-CM | POA: Diagnosis not present

## 2019-05-06 DIAGNOSIS — Z9689 Presence of other specified functional implants: Secondary | ICD-10-CM | POA: Diagnosis not present

## 2019-05-06 DIAGNOSIS — R569 Unspecified convulsions: Secondary | ICD-10-CM | POA: Diagnosis not present

## 2019-05-06 DIAGNOSIS — K838 Other specified diseases of biliary tract: Secondary | ICD-10-CM | POA: Diagnosis not present

## 2019-05-06 DIAGNOSIS — J449 Chronic obstructive pulmonary disease, unspecified: Secondary | ICD-10-CM | POA: Diagnosis not present

## 2019-05-06 DIAGNOSIS — N132 Hydronephrosis with renal and ureteral calculous obstruction: Secondary | ICD-10-CM | POA: Diagnosis not present

## 2019-05-06 DIAGNOSIS — N179 Acute kidney failure, unspecified: Secondary | ICD-10-CM | POA: Diagnosis not present

## 2019-05-06 DIAGNOSIS — N201 Calculus of ureter: Secondary | ICD-10-CM | POA: Diagnosis not present

## 2019-05-06 DIAGNOSIS — R740 Nonspecific elevation of levels of transaminase and lactic acid dehydrogenase [LDH]: Secondary | ICD-10-CM | POA: Diagnosis not present

## 2019-05-06 DIAGNOSIS — R197 Diarrhea, unspecified: Secondary | ICD-10-CM | POA: Diagnosis not present

## 2019-05-06 DIAGNOSIS — G8929 Other chronic pain: Secondary | ICD-10-CM | POA: Diagnosis not present

## 2019-05-06 DIAGNOSIS — Z79899 Other long term (current) drug therapy: Secondary | ICD-10-CM | POA: Diagnosis not present

## 2019-05-06 DIAGNOSIS — N183 Chronic kidney disease, stage 3 (moderate): Secondary | ICD-10-CM | POA: Diagnosis not present

## 2019-05-06 DIAGNOSIS — T83511A Infection and inflammatory reaction due to indwelling urethral catheter, initial encounter: Secondary | ICD-10-CM | POA: Diagnosis not present

## 2019-05-06 DIAGNOSIS — K429 Umbilical hernia without obstruction or gangrene: Secondary | ICD-10-CM | POA: Diagnosis not present

## 2019-05-06 DIAGNOSIS — N2889 Other specified disorders of kidney and ureter: Secondary | ICD-10-CM | POA: Diagnosis not present

## 2019-05-06 DIAGNOSIS — G309 Alzheimer's disease, unspecified: Secondary | ICD-10-CM | POA: Diagnosis not present

## 2019-05-06 DIAGNOSIS — D696 Thrombocytopenia, unspecified: Secondary | ICD-10-CM | POA: Diagnosis not present

## 2019-05-06 DIAGNOSIS — E43 Unspecified severe protein-calorie malnutrition: Secondary | ICD-10-CM | POA: Diagnosis not present

## 2019-05-06 DIAGNOSIS — N261 Atrophy of kidney (terminal): Secondary | ICD-10-CM | POA: Diagnosis not present

## 2019-05-06 DIAGNOSIS — Z9981 Dependence on supplemental oxygen: Secondary | ICD-10-CM | POA: Diagnosis not present

## 2019-05-14 ENCOUNTER — Other Ambulatory Visit: Payer: Medicare Other

## 2019-05-14 DIAGNOSIS — N319 Neuromuscular dysfunction of bladder, unspecified: Secondary | ICD-10-CM | POA: Diagnosis not present

## 2019-05-14 DIAGNOSIS — I130 Hypertensive heart and chronic kidney disease with heart failure and stage 1 through stage 4 chronic kidney disease, or unspecified chronic kidney disease: Secondary | ICD-10-CM | POA: Diagnosis not present

## 2019-05-14 DIAGNOSIS — M810 Age-related osteoporosis without current pathological fracture: Secondary | ICD-10-CM | POA: Diagnosis not present

## 2019-05-14 DIAGNOSIS — I5033 Acute on chronic diastolic (congestive) heart failure: Secondary | ICD-10-CM | POA: Diagnosis not present

## 2019-05-14 DIAGNOSIS — N183 Chronic kidney disease, stage 3 (moderate): Secondary | ICD-10-CM | POA: Diagnosis not present

## 2019-05-14 DIAGNOSIS — N179 Acute kidney failure, unspecified: Secondary | ICD-10-CM | POA: Diagnosis not present

## 2019-05-14 DIAGNOSIS — K219 Gastro-esophageal reflux disease without esophagitis: Secondary | ICD-10-CM | POA: Diagnosis not present

## 2019-05-14 DIAGNOSIS — I7774 Dissection of vertebral artery: Secondary | ICD-10-CM | POA: Diagnosis not present

## 2019-05-14 DIAGNOSIS — J9601 Acute respiratory failure with hypoxia: Secondary | ICD-10-CM | POA: Diagnosis not present

## 2019-05-14 DIAGNOSIS — D631 Anemia in chronic kidney disease: Secondary | ICD-10-CM | POA: Diagnosis not present

## 2019-05-14 DIAGNOSIS — I739 Peripheral vascular disease, unspecified: Secondary | ICD-10-CM | POA: Diagnosis not present

## 2019-05-14 DIAGNOSIS — E43 Unspecified severe protein-calorie malnutrition: Secondary | ICD-10-CM | POA: Diagnosis not present

## 2019-05-14 DIAGNOSIS — T39391D Poisoning by other nonsteroidal anti-inflammatory drugs [NSAID], accidental (unintentional), subsequent encounter: Secondary | ICD-10-CM | POA: Diagnosis not present

## 2019-05-14 DIAGNOSIS — Z466 Encounter for fitting and adjustment of urinary device: Secondary | ICD-10-CM | POA: Diagnosis not present

## 2019-05-14 DIAGNOSIS — N390 Urinary tract infection, site not specified: Secondary | ICD-10-CM | POA: Diagnosis not present

## 2019-05-14 DIAGNOSIS — D696 Thrombocytopenia, unspecified: Secondary | ICD-10-CM | POA: Diagnosis not present

## 2019-05-14 DIAGNOSIS — Z8673 Personal history of transient ischemic attack (TIA), and cerebral infarction without residual deficits: Secondary | ICD-10-CM | POA: Diagnosis not present

## 2019-05-14 DIAGNOSIS — G300 Alzheimer's disease with early onset: Secondary | ICD-10-CM | POA: Diagnosis not present

## 2019-05-14 DIAGNOSIS — J439 Emphysema, unspecified: Secondary | ICD-10-CM | POA: Diagnosis not present

## 2019-05-14 DIAGNOSIS — D689 Coagulation defect, unspecified: Secondary | ICD-10-CM | POA: Diagnosis not present

## 2019-05-15 DIAGNOSIS — N135 Crossing vessel and stricture of ureter without hydronephrosis: Secondary | ICD-10-CM | POA: Diagnosis not present

## 2019-05-15 DIAGNOSIS — R339 Retention of urine, unspecified: Secondary | ICD-10-CM | POA: Diagnosis not present

## 2019-05-17 ENCOUNTER — Other Ambulatory Visit: Payer: Self-pay | Admitting: Pharmacist

## 2019-05-20 ENCOUNTER — Other Ambulatory Visit: Payer: Self-pay

## 2019-05-20 ENCOUNTER — Telehealth (INDEPENDENT_AMBULATORY_CARE_PROVIDER_SITE_OTHER): Payer: Medicare Other | Admitting: Cardiology

## 2019-05-20 ENCOUNTER — Encounter: Payer: Self-pay | Admitting: Cardiology

## 2019-05-20 VITALS — BP 133/66 | HR 87 | Ht 60.0 in | Wt 83.0 lb

## 2019-05-20 DIAGNOSIS — N390 Urinary tract infection, site not specified: Secondary | ICD-10-CM | POA: Diagnosis not present

## 2019-05-20 DIAGNOSIS — F17200 Nicotine dependence, unspecified, uncomplicated: Secondary | ICD-10-CM

## 2019-05-20 DIAGNOSIS — R0600 Dyspnea, unspecified: Secondary | ICD-10-CM

## 2019-05-20 DIAGNOSIS — G4700 Insomnia, unspecified: Secondary | ICD-10-CM | POA: Diagnosis not present

## 2019-05-20 DIAGNOSIS — Z09 Encounter for follow-up examination after completed treatment for conditions other than malignant neoplasm: Secondary | ICD-10-CM | POA: Diagnosis not present

## 2019-05-20 DIAGNOSIS — I10 Essential (primary) hypertension: Secondary | ICD-10-CM | POA: Diagnosis not present

## 2019-05-20 DIAGNOSIS — R0609 Other forms of dyspnea: Secondary | ICD-10-CM

## 2019-05-20 DIAGNOSIS — Z79899 Other long term (current) drug therapy: Secondary | ICD-10-CM | POA: Diagnosis not present

## 2019-05-20 NOTE — Patient Instructions (Signed)

## 2019-05-20 NOTE — Progress Notes (Signed)
Virtual Visit via Telephone Note   This visit type was conducted due to national recommendations for restrictions regarding the COVID-19 Pandemic (e.g. social distancing) in an effort to limit this patient's exposure and mitigate transmission in our community.  Due to her co-morbid illnesses, this patient is at least at moderate risk for complications without adequate follow up.  This format is felt to be most appropriate for this patient at this time.  The patient did not have access to video technology/had technical difficulties with video requiring transitioning to audio format only (telephone).  All issues noted in this document were discussed and addressed.  No physical exam could be performed with this format.  Please refer to the patient's chart for her  consent to telehealth for [facility].   Date:  05/20/2019   ID:  [name], DOB [birth year], MRN [id number]  Patient Location: Home Provider Location: Office  PCP:  Jeanie Sewer, NP  Cardiologist:  No primary care provider on file.  Electrophysiologist:  None   Evaluation Performed:  Follow-Up Visit  Chief Complaint: Shortness of breath and pedal edema  History of Present Illness:    [name] is a 68 y.o. female with past medical history of COPD.  She uses oxygen and continues to smoke.  Her son and family member contributed to a lot of her history.  She tells me that she is feeling much better now.  No chest pain orthopnea or PND.  She takes care of of activities of daily living and as mentioned above continues to smoke significantly.  She mentions to me that she saw her primary care physician less than a month ago and had blood work.  The patient does not have symptoms concerning for COVID-19 infection (fever, chills, cough, or new shortness of breath).    Past Medical History:  Diagnosis Date  . Acute renal failure superimposed on stage 3 chronic kidney disease ([place]) 03/26/2019  . Alzheimer's dementia ([place])    takes Aricept nightly  . Alzheimer's dementia ([place]) 03/26/2019  . Anemia   . Anemia, unspecified 03/27/2019  . COPD (chronic obstructive pulmonary disease) ([place])   . COPD with acute exacerbation ([place]) 03/26/2019  . Delirium 03/27/2019  . History of blood transfusion    received 2units on Feb 17,2015  . History of MRSA infection    several yrs ago  . HLD (hyperlipidemia) 06/06/2018  . HTN (hypertension) 06/06/2018  . Hyperlipidemia    takes Atorvastatin daily  . Hypertension    takes Amlodipine and Carvedilol daily  . Insomnia    takes Ambien nightly  . Memory loss   . Renal disorder    kidney stones  . Seizures ([place])   . Stroke ([place])   . Subacute delirium 10/14/2013   takes Seroquel and Dilantin nightly  . Toxic encephalopathy   . UTI (lower urinary tract infection)    takes Macrodantin daily   Past Surgical History:  Procedure Laterality Date  . ABDOMINAL ANGIOGRAM N/A 01/20/2014   Procedure: ABDOMINAL ANGIOGRAM;  Surgeon: Elam Dutch, MD;  Location[facility] C[facility] LAB;  Service: Cardiovascular;  Laterality: N/A;  . ABDOMINAL HYSTERECTOMY    . AMPUTATION Left 02/08/2014   Procedure: AMPUTATION Left 4th & 5th toes;  Surgeon: Conrad Hatley, MD;  Location: [place];  Service: Vascular;  Laterality: Left;  . BLADDER SURGERY    . CHOLECYSTECTOMY  1995   Gall Bladder  . ERCP N/A 10/24/2018   Procedure: ENDOSCOPIC RETROGRADE CHOLANGIOPANCREATOGRAPHY (ERCP);  Surgeon: Clarene Essex, MD;  Location: [place];  Service: Endoscopy;  Laterality: N/A;  . INSERTION OF ILIAC STENT Left 01/26/2014   Procedure: INSERTION OF ILIAC STENT- LEFT COMMON ILIAC ARTERY STENT ;  Surgeon: Conrad Mokelumne Hill, MD;  Location: [place];  Service: Vascular;  Laterality: Left;  . KNEE SURGERY    . PANCREATIC STENT PLACEMENT  10/24/2018   Procedure: PANCREATIC STENT PLACEMENT;  Surgeon: Clarene Essex, MD;  Location: [place];  Service: Endoscopy;;  . REMOVAL OF STONES  10/24/2018   Procedure: REMOVAL OF STONES;  Surgeon:  Clarene Essex, MD;  Location: [place];  Service: Endoscopy;;  balloon sweep  . SPHINCTEROTOMY  10/24/2018   Procedure: SPHINCTEROTOMY;  Surgeon: Clarene Essex, MD;  Location: [place];  Service: Endoscopy;;     Current Meds  Medication Sig  . albuterol (PROVENTIL) (2.5 MG/3ML) 0.083% nebulizer solution Take 2.5 mg by nebulization every 6 (six) hours as needed for wheezing or shortness of breath.  Marland Kitchen atorvastatin (LIPITOR) 20 MG tablet Take 20 mg by mouth daily.  . carvedilol (COREG) 6.25 MG tablet Take 6.25 mg by mouth 2 (two) times daily.  Marland Kitchen donepezil (ARICEPT) 10 MG tablet Take 1 tablet (10 mg total) by mouth at bedtime.  Marland Kitchen doxazosin (CARDURA) 2 MG tablet Take 2 mg by mouth daily.  . ferrous sulfate 325 (65 FE) MG tablet Take 1 tablet (325 mg total) by mouth 2 (two) times daily with a meal.  . furosemide (LASIX) 40 MG tablet Take 1 tablet (40 mg total) by mouth 2 (two) times daily.  . hydrALAZINE (APRESOLINE) 50 MG tablet Take 1 tablet (50 mg total) by mouth 3 (three) times daily for 30 days.  . hydrOXYzine (ATARAX/VISTARIL) 25 MG tablet Take 12.5 mg by mouth daily as needed for anxiety.  . isosorbide mononitrate (IMDUR) 30 MG 24 hr tablet Take 1 tablet (30 mg total) by mouth daily for 30 days.  Marland Kitchen levETIRAcetam (KEPPRA) 500 MG tablet Take 500 mg by mouth 2 (two) times daily.  . Melatonin 3-2 MG TABS Take 3 mg by mouth at bedtime.  . ondansetron (ZOFRAN-ODT) 4 MG disintegrating tablet Take 4 mg by mouth every 6 (six) hours as needed for nausea or vomiting.   . OXYGEN Inhale 2 L into the lungs.  . phenytoin (DILANTIN) 100 MG ER capsule Take 3 capsules (300 mg total) by mouth at bedtime. (Patient taking differently: Take 200 mg by mouth at bedtime. )  . sodium bicarbonate 650 MG tablet Take 1 tablet (650 mg total) by mouth 3 (three) times daily. (Patient taking differently: Take 650 mg by mouth daily. )  . spironolactone (ALDACTONE) 25 MG tablet Take 1 tablet (25 mg total) by mouth daily  for 30 days.     Allergies:   Codeine; Ace inhibitors; and Latex   Social History   Tobacco Use  . Smoking status: Current Every Day Smoker    Packs/day: 1.00    Types: Cigarettes  . Smokeless tobacco: Never Used  Substance Use Topics  . Alcohol use: No  . Drug use: No     Family Hx: The patient's family history includes Cancer in her brother.  ROS:   Please see the history of present illness.    As mentioned above All other systems reviewed and are negative.   Prior CV studies:   The following studies were reviewed today:  Echocardiogram done in the past was reviewed  Labs/Other Tests and Data Reviewed:    EKG:  No  ECG reviewed.  Recent Labs: 03/28/2019: B Natriuretic Peptide 883.8 03/29/2019: Hemoglobin 8.6; Platelets 103 03/30/2019: ALT 6; BUN 13; Creatinine, Ser 1.23; Magnesium 2.1; Potassium 4.3; Sodium 140   Recent Lipid Panel Lab Results  Component Value Date/Time   TRIG 224 (H) 11/23/2017 06:12 AM    Wt Readings from Last 3 Encounters:  05/20/19 83 lb (37.6 kg)  04/28/19 88 lb (39.9 kg)  03/27/19 102 lb 8.2 oz (46.5 kg)     Objective:    Vital Signs:  BP 133/66 (BP Location: Left Arm, Patient Position: Sitting, Cuff Size: Normal)   Pulse 87   Ht 5' (1.524 m)   Wt 83 lb (37.6 kg)   BMI 16.21 kg/m    VITAL SIGNS:  reviewed  ASSESSMENT & PLAN:    1. Shortness of breath on exertion: I discussed my findings with the patient at extensive length.  I told her to quit smoking but she is not keen on it.  She is reluctant to even consider quitting smoking and the risks were explained and she vocalized understanding.  I reemphasized this with her son also.  She is due for an echocardiogram and we will let her know what exactly her appointment is.  She will be seen in follow-up appointment in 3 months or earlier if she has any concerns.  She knows to go to the nearest emergency room for any concerning symptoms.  COVID-19 Education: The signs and symptoms of  COVID-19 were discussed with the patient and how to seek care for testing (follow up with PCP or arrange E-visit).  The importance of social distancing was discussed today.  Time:   Today, I have spent 15 minutes with the patient with telehealth technology discussing the above problems.     Medication Adjustments/Labs and Tests Ordered: Current medicines are reviewed at length with the patient today.  Concerns regarding medicines are outlined above.   Tests Ordered: No orders of the defined types were placed in this encounter.   Medication Changes: No orders of the defined types were placed in this encounter.   Disposition:  Follow up in 3 month(s)  Signed, Jenean Lindau, MD  05/20/2019 10:50 AM    [place]

## 2019-05-21 DIAGNOSIS — J9601 Acute respiratory failure with hypoxia: Secondary | ICD-10-CM | POA: Diagnosis not present

## 2019-05-21 DIAGNOSIS — N183 Chronic kidney disease, stage 3 (moderate): Secondary | ICD-10-CM | POA: Diagnosis not present

## 2019-05-21 DIAGNOSIS — N319 Neuromuscular dysfunction of bladder, unspecified: Secondary | ICD-10-CM | POA: Diagnosis not present

## 2019-05-21 DIAGNOSIS — Z8673 Personal history of transient ischemic attack (TIA), and cerebral infarction without residual deficits: Secondary | ICD-10-CM | POA: Diagnosis not present

## 2019-05-21 DIAGNOSIS — Z466 Encounter for fitting and adjustment of urinary device: Secondary | ICD-10-CM | POA: Diagnosis not present

## 2019-05-21 DIAGNOSIS — I5033 Acute on chronic diastolic (congestive) heart failure: Secondary | ICD-10-CM | POA: Diagnosis not present

## 2019-05-21 DIAGNOSIS — I7774 Dissection of vertebral artery: Secondary | ICD-10-CM | POA: Diagnosis not present

## 2019-05-21 DIAGNOSIS — N179 Acute kidney failure, unspecified: Secondary | ICD-10-CM | POA: Diagnosis not present

## 2019-05-21 DIAGNOSIS — G300 Alzheimer's disease with early onset: Secondary | ICD-10-CM | POA: Diagnosis not present

## 2019-05-21 DIAGNOSIS — N390 Urinary tract infection, site not specified: Secondary | ICD-10-CM | POA: Diagnosis not present

## 2019-05-21 DIAGNOSIS — K219 Gastro-esophageal reflux disease without esophagitis: Secondary | ICD-10-CM | POA: Diagnosis not present

## 2019-05-21 DIAGNOSIS — I130 Hypertensive heart and chronic kidney disease with heart failure and stage 1 through stage 4 chronic kidney disease, or unspecified chronic kidney disease: Secondary | ICD-10-CM | POA: Diagnosis not present

## 2019-05-21 DIAGNOSIS — J439 Emphysema, unspecified: Secondary | ICD-10-CM | POA: Diagnosis not present

## 2019-05-21 DIAGNOSIS — D696 Thrombocytopenia, unspecified: Secondary | ICD-10-CM | POA: Diagnosis not present

## 2019-05-21 DIAGNOSIS — M810 Age-related osteoporosis without current pathological fracture: Secondary | ICD-10-CM | POA: Diagnosis not present

## 2019-05-21 DIAGNOSIS — T39391D Poisoning by other nonsteroidal anti-inflammatory drugs [NSAID], accidental (unintentional), subsequent encounter: Secondary | ICD-10-CM | POA: Diagnosis not present

## 2019-05-21 DIAGNOSIS — E43 Unspecified severe protein-calorie malnutrition: Secondary | ICD-10-CM | POA: Diagnosis not present

## 2019-05-21 DIAGNOSIS — I739 Peripheral vascular disease, unspecified: Secondary | ICD-10-CM | POA: Diagnosis not present

## 2019-05-21 DIAGNOSIS — D689 Coagulation defect, unspecified: Secondary | ICD-10-CM | POA: Diagnosis not present

## 2019-05-21 DIAGNOSIS — D631 Anemia in chronic kidney disease: Secondary | ICD-10-CM | POA: Diagnosis not present

## 2019-05-25 DIAGNOSIS — N390 Urinary tract infection, site not specified: Secondary | ICD-10-CM | POA: Diagnosis not present

## 2019-05-25 DIAGNOSIS — R339 Retention of urine, unspecified: Secondary | ICD-10-CM | POA: Diagnosis not present

## 2019-05-25 DIAGNOSIS — N135 Crossing vessel and stricture of ureter without hydronephrosis: Secondary | ICD-10-CM | POA: Diagnosis not present

## 2019-05-27 DIAGNOSIS — J9601 Acute respiratory failure with hypoxia: Secondary | ICD-10-CM | POA: Diagnosis not present

## 2019-05-27 DIAGNOSIS — Z466 Encounter for fitting and adjustment of urinary device: Secondary | ICD-10-CM | POA: Diagnosis not present

## 2019-05-27 DIAGNOSIS — N179 Acute kidney failure, unspecified: Secondary | ICD-10-CM | POA: Diagnosis not present

## 2019-05-27 DIAGNOSIS — J439 Emphysema, unspecified: Secondary | ICD-10-CM | POA: Diagnosis not present

## 2019-05-27 DIAGNOSIS — N319 Neuromuscular dysfunction of bladder, unspecified: Secondary | ICD-10-CM | POA: Diagnosis not present

## 2019-05-27 DIAGNOSIS — G300 Alzheimer's disease with early onset: Secondary | ICD-10-CM | POA: Diagnosis not present

## 2019-05-27 DIAGNOSIS — D689 Coagulation defect, unspecified: Secondary | ICD-10-CM | POA: Diagnosis not present

## 2019-05-27 DIAGNOSIS — I739 Peripheral vascular disease, unspecified: Secondary | ICD-10-CM | POA: Diagnosis not present

## 2019-05-27 DIAGNOSIS — I130 Hypertensive heart and chronic kidney disease with heart failure and stage 1 through stage 4 chronic kidney disease, or unspecified chronic kidney disease: Secondary | ICD-10-CM | POA: Diagnosis not present

## 2019-05-27 DIAGNOSIS — I7774 Dissection of vertebral artery: Secondary | ICD-10-CM | POA: Diagnosis not present

## 2019-05-27 DIAGNOSIS — K219 Gastro-esophageal reflux disease without esophagitis: Secondary | ICD-10-CM | POA: Diagnosis not present

## 2019-05-27 DIAGNOSIS — T39391D Poisoning by other nonsteroidal anti-inflammatory drugs [NSAID], accidental (unintentional), subsequent encounter: Secondary | ICD-10-CM | POA: Diagnosis not present

## 2019-05-27 DIAGNOSIS — N183 Chronic kidney disease, stage 3 (moderate): Secondary | ICD-10-CM | POA: Diagnosis not present

## 2019-05-27 DIAGNOSIS — D631 Anemia in chronic kidney disease: Secondary | ICD-10-CM | POA: Diagnosis not present

## 2019-05-27 DIAGNOSIS — M810 Age-related osteoporosis without current pathological fracture: Secondary | ICD-10-CM | POA: Diagnosis not present

## 2019-05-27 DIAGNOSIS — Z8673 Personal history of transient ischemic attack (TIA), and cerebral infarction without residual deficits: Secondary | ICD-10-CM | POA: Diagnosis not present

## 2019-05-27 DIAGNOSIS — N390 Urinary tract infection, site not specified: Secondary | ICD-10-CM | POA: Diagnosis not present

## 2019-05-27 DIAGNOSIS — I5033 Acute on chronic diastolic (congestive) heart failure: Secondary | ICD-10-CM | POA: Diagnosis not present

## 2019-05-27 DIAGNOSIS — E43 Unspecified severe protein-calorie malnutrition: Secondary | ICD-10-CM | POA: Diagnosis not present

## 2019-05-27 DIAGNOSIS — D696 Thrombocytopenia, unspecified: Secondary | ICD-10-CM | POA: Diagnosis not present

## 2019-05-31 DIAGNOSIS — J449 Chronic obstructive pulmonary disease, unspecified: Secondary | ICD-10-CM | POA: Diagnosis not present

## 2019-06-04 DIAGNOSIS — J439 Emphysema, unspecified: Secondary | ICD-10-CM | POA: Diagnosis not present

## 2019-06-04 DIAGNOSIS — D689 Coagulation defect, unspecified: Secondary | ICD-10-CM | POA: Diagnosis not present

## 2019-06-04 DIAGNOSIS — K219 Gastro-esophageal reflux disease without esophagitis: Secondary | ICD-10-CM | POA: Diagnosis not present

## 2019-06-04 DIAGNOSIS — N183 Chronic kidney disease, stage 3 (moderate): Secondary | ICD-10-CM | POA: Diagnosis not present

## 2019-06-04 DIAGNOSIS — I5033 Acute on chronic diastolic (congestive) heart failure: Secondary | ICD-10-CM | POA: Diagnosis not present

## 2019-06-04 DIAGNOSIS — E43 Unspecified severe protein-calorie malnutrition: Secondary | ICD-10-CM | POA: Diagnosis not present

## 2019-06-04 DIAGNOSIS — Z8673 Personal history of transient ischemic attack (TIA), and cerebral infarction without residual deficits: Secondary | ICD-10-CM | POA: Diagnosis not present

## 2019-06-04 DIAGNOSIS — G300 Alzheimer's disease with early onset: Secondary | ICD-10-CM | POA: Diagnosis not present

## 2019-06-04 DIAGNOSIS — N179 Acute kidney failure, unspecified: Secondary | ICD-10-CM | POA: Diagnosis not present

## 2019-06-04 DIAGNOSIS — Z466 Encounter for fitting and adjustment of urinary device: Secondary | ICD-10-CM | POA: Diagnosis not present

## 2019-06-04 DIAGNOSIS — D631 Anemia in chronic kidney disease: Secondary | ICD-10-CM | POA: Diagnosis not present

## 2019-06-04 DIAGNOSIS — T39391D Poisoning by other nonsteroidal anti-inflammatory drugs [NSAID], accidental (unintentional), subsequent encounter: Secondary | ICD-10-CM | POA: Diagnosis not present

## 2019-06-04 DIAGNOSIS — N390 Urinary tract infection, site not specified: Secondary | ICD-10-CM | POA: Diagnosis not present

## 2019-06-04 DIAGNOSIS — I130 Hypertensive heart and chronic kidney disease with heart failure and stage 1 through stage 4 chronic kidney disease, or unspecified chronic kidney disease: Secondary | ICD-10-CM | POA: Diagnosis not present

## 2019-06-04 DIAGNOSIS — J9601 Acute respiratory failure with hypoxia: Secondary | ICD-10-CM | POA: Diagnosis not present

## 2019-06-04 DIAGNOSIS — I7774 Dissection of vertebral artery: Secondary | ICD-10-CM | POA: Diagnosis not present

## 2019-06-04 DIAGNOSIS — I739 Peripheral vascular disease, unspecified: Secondary | ICD-10-CM | POA: Diagnosis not present

## 2019-06-04 DIAGNOSIS — N319 Neuromuscular dysfunction of bladder, unspecified: Secondary | ICD-10-CM | POA: Diagnosis not present

## 2019-06-04 DIAGNOSIS — D696 Thrombocytopenia, unspecified: Secondary | ICD-10-CM | POA: Diagnosis not present

## 2019-06-04 DIAGNOSIS — M810 Age-related osteoporosis without current pathological fracture: Secondary | ICD-10-CM | POA: Diagnosis not present

## 2019-06-08 ENCOUNTER — Other Ambulatory Visit: Payer: Self-pay | Admitting: *Deleted

## 2019-06-08 NOTE — Patient Outreach (Signed)
Member eligible for [facility] Management services. She has been active with [facility] in the past.  Member reassessed for [facility] services due frequent ED and hospital admits per Patient Ping. Most recent hospital discharge was from [facility] on 05/12/19. Most of member's hospital encounters have been at [facility], per Patient [place].  Telephone call made to [facility] home at [phone]. HIPPA verified. Mrs. Helmer appeared to be alert and oriented. She was agreeable to [facility] follow up. She also gave Probation officer permission to contact her son Larene Beach to discuss [place] [facility] re-engagement as well. Left HIPPA compliant voicemail message for Larene Beach at [phone].  Noted Mrs. Ourada has a medical history of COPD, wears oxygen and still smokes, per chart notes, Alzheimer's dementia, HTN, HLD. She most recently had telemedicine visit with Cardiology on 05/20/19 per chart notes.  Writer also called [facility] in [place] to see if member was active with their palliative program and she is not. It appeared outpatient palliative services were mentioned back in April 2020 during hospitalization at [facility].   Writer then contacted [facility]. Spoke with Tammy at [facility] at [phone]. Confirmed with Tammy that Mrs. Busk is active with [facility] outpatient palliative care program administered by [facility].   Writer will not make [facility] referral after all due to member being active with [facility].  Marthenia Rolling, MSN-Ed, RN,BSN [place] Acute Care Coordinator [phone] [facility]) [phone]  (Toll free office)

## 2019-06-10 ENCOUNTER — Ambulatory Visit: Payer: Medicare HMO | Admitting: Adult Health

## 2019-06-10 DIAGNOSIS — R339 Retention of urine, unspecified: Secondary | ICD-10-CM | POA: Diagnosis not present

## 2019-06-10 DIAGNOSIS — N390 Urinary tract infection, site not specified: Secondary | ICD-10-CM | POA: Diagnosis not present

## 2019-06-10 DIAGNOSIS — N135 Crossing vessel and stricture of ureter without hydronephrosis: Secondary | ICD-10-CM | POA: Diagnosis not present

## 2019-06-22 DIAGNOSIS — N390 Urinary tract infection, site not specified: Secondary | ICD-10-CM | POA: Diagnosis not present

## 2019-06-22 DIAGNOSIS — R197 Diarrhea, unspecified: Secondary | ICD-10-CM | POA: Diagnosis not present

## 2019-06-22 DIAGNOSIS — D509 Iron deficiency anemia, unspecified: Secondary | ICD-10-CM | POA: Diagnosis not present

## 2019-06-22 DIAGNOSIS — N184 Chronic kidney disease, stage 4 (severe): Secondary | ICD-10-CM | POA: Diagnosis not present

## 2019-06-22 DIAGNOSIS — G4700 Insomnia, unspecified: Secondary | ICD-10-CM | POA: Diagnosis not present

## 2019-06-23 DIAGNOSIS — T39391D Poisoning by other nonsteroidal anti-inflammatory drugs [NSAID], accidental (unintentional), subsequent encounter: Secondary | ICD-10-CM | POA: Diagnosis not present

## 2019-06-23 DIAGNOSIS — Z466 Encounter for fitting and adjustment of urinary device: Secondary | ICD-10-CM | POA: Diagnosis not present

## 2019-06-23 DIAGNOSIS — G300 Alzheimer's disease with early onset: Secondary | ICD-10-CM | POA: Diagnosis not present

## 2019-06-23 DIAGNOSIS — J9601 Acute respiratory failure with hypoxia: Secondary | ICD-10-CM | POA: Diagnosis not present

## 2019-06-23 DIAGNOSIS — D689 Coagulation defect, unspecified: Secondary | ICD-10-CM | POA: Diagnosis not present

## 2019-06-23 DIAGNOSIS — I5033 Acute on chronic diastolic (congestive) heart failure: Secondary | ICD-10-CM | POA: Diagnosis not present

## 2019-06-23 DIAGNOSIS — N319 Neuromuscular dysfunction of bladder, unspecified: Secondary | ICD-10-CM | POA: Diagnosis not present

## 2019-06-23 DIAGNOSIS — I7774 Dissection of vertebral artery: Secondary | ICD-10-CM | POA: Diagnosis not present

## 2019-06-23 DIAGNOSIS — Z8673 Personal history of transient ischemic attack (TIA), and cerebral infarction without residual deficits: Secondary | ICD-10-CM | POA: Diagnosis not present

## 2019-06-23 DIAGNOSIS — N179 Acute kidney failure, unspecified: Secondary | ICD-10-CM | POA: Diagnosis not present

## 2019-06-23 DIAGNOSIS — I739 Peripheral vascular disease, unspecified: Secondary | ICD-10-CM | POA: Diagnosis not present

## 2019-06-23 DIAGNOSIS — J439 Emphysema, unspecified: Secondary | ICD-10-CM | POA: Diagnosis not present

## 2019-06-23 DIAGNOSIS — N183 Chronic kidney disease, stage 3 (moderate): Secondary | ICD-10-CM | POA: Diagnosis not present

## 2019-06-23 DIAGNOSIS — I130 Hypertensive heart and chronic kidney disease with heart failure and stage 1 through stage 4 chronic kidney disease, or unspecified chronic kidney disease: Secondary | ICD-10-CM | POA: Diagnosis not present

## 2019-06-23 DIAGNOSIS — E43 Unspecified severe protein-calorie malnutrition: Secondary | ICD-10-CM | POA: Diagnosis not present

## 2019-06-23 DIAGNOSIS — D696 Thrombocytopenia, unspecified: Secondary | ICD-10-CM | POA: Diagnosis not present

## 2019-06-23 DIAGNOSIS — D631 Anemia in chronic kidney disease: Secondary | ICD-10-CM | POA: Diagnosis not present

## 2019-06-23 DIAGNOSIS — M810 Age-related osteoporosis without current pathological fracture: Secondary | ICD-10-CM | POA: Diagnosis not present

## 2019-06-23 DIAGNOSIS — N390 Urinary tract infection, site not specified: Secondary | ICD-10-CM | POA: Diagnosis not present

## 2019-06-23 DIAGNOSIS — K219 Gastro-esophageal reflux disease without esophagitis: Secondary | ICD-10-CM | POA: Diagnosis not present

## 2019-06-30 DIAGNOSIS — J449 Chronic obstructive pulmonary disease, unspecified: Secondary | ICD-10-CM | POA: Diagnosis not present

## 2019-07-01 DIAGNOSIS — E875 Hyperkalemia: Secondary | ICD-10-CM | POA: Diagnosis not present

## 2019-07-01 DIAGNOSIS — D500 Iron deficiency anemia secondary to blood loss (chronic): Secondary | ICD-10-CM | POA: Diagnosis not present

## 2019-07-02 DIAGNOSIS — I739 Peripheral vascular disease, unspecified: Secondary | ICD-10-CM | POA: Diagnosis not present

## 2019-07-02 DIAGNOSIS — N390 Urinary tract infection, site not specified: Secondary | ICD-10-CM | POA: Diagnosis not present

## 2019-07-02 DIAGNOSIS — D631 Anemia in chronic kidney disease: Secondary | ICD-10-CM | POA: Diagnosis not present

## 2019-07-02 DIAGNOSIS — K219 Gastro-esophageal reflux disease without esophagitis: Secondary | ICD-10-CM | POA: Diagnosis not present

## 2019-07-02 DIAGNOSIS — E43 Unspecified severe protein-calorie malnutrition: Secondary | ICD-10-CM | POA: Diagnosis not present

## 2019-07-02 DIAGNOSIS — N183 Chronic kidney disease, stage 3 (moderate): Secondary | ICD-10-CM | POA: Diagnosis not present

## 2019-07-02 DIAGNOSIS — N179 Acute kidney failure, unspecified: Secondary | ICD-10-CM | POA: Diagnosis not present

## 2019-07-02 DIAGNOSIS — N319 Neuromuscular dysfunction of bladder, unspecified: Secondary | ICD-10-CM | POA: Diagnosis not present

## 2019-07-02 DIAGNOSIS — I5033 Acute on chronic diastolic (congestive) heart failure: Secondary | ICD-10-CM | POA: Diagnosis not present

## 2019-07-02 DIAGNOSIS — I130 Hypertensive heart and chronic kidney disease with heart failure and stage 1 through stage 4 chronic kidney disease, or unspecified chronic kidney disease: Secondary | ICD-10-CM | POA: Diagnosis not present

## 2019-07-02 DIAGNOSIS — D689 Coagulation defect, unspecified: Secondary | ICD-10-CM | POA: Diagnosis not present

## 2019-07-02 DIAGNOSIS — Z466 Encounter for fitting and adjustment of urinary device: Secondary | ICD-10-CM | POA: Diagnosis not present

## 2019-07-02 DIAGNOSIS — Z8673 Personal history of transient ischemic attack (TIA), and cerebral infarction without residual deficits: Secondary | ICD-10-CM | POA: Diagnosis not present

## 2019-07-02 DIAGNOSIS — D696 Thrombocytopenia, unspecified: Secondary | ICD-10-CM | POA: Diagnosis not present

## 2019-07-02 DIAGNOSIS — M810 Age-related osteoporosis without current pathological fracture: Secondary | ICD-10-CM | POA: Diagnosis not present

## 2019-07-02 DIAGNOSIS — J439 Emphysema, unspecified: Secondary | ICD-10-CM | POA: Diagnosis not present

## 2019-07-02 DIAGNOSIS — G300 Alzheimer's disease with early onset: Secondary | ICD-10-CM | POA: Diagnosis not present

## 2019-07-02 DIAGNOSIS — T39391D Poisoning by other nonsteroidal anti-inflammatory drugs [NSAID], accidental (unintentional), subsequent encounter: Secondary | ICD-10-CM | POA: Diagnosis not present

## 2019-07-02 DIAGNOSIS — J9601 Acute respiratory failure with hypoxia: Secondary | ICD-10-CM | POA: Diagnosis not present

## 2019-07-02 DIAGNOSIS — I7774 Dissection of vertebral artery: Secondary | ICD-10-CM | POA: Diagnosis not present

## 2019-07-06 DIAGNOSIS — R339 Retention of urine, unspecified: Secondary | ICD-10-CM | POA: Diagnosis not present

## 2019-07-06 DIAGNOSIS — N135 Crossing vessel and stricture of ureter without hydronephrosis: Secondary | ICD-10-CM | POA: Diagnosis not present

## 2019-07-06 DIAGNOSIS — N390 Urinary tract infection, site not specified: Secondary | ICD-10-CM | POA: Diagnosis not present

## 2019-07-06 NOTE — Telephone Encounter (Signed)
This encounter was created in error - please disregard.

## 2019-07-09 IMAGING — DX PORTABLE CHEST - 1 VIEW
1 series · 1 of 1 positions shown · non-contrast
Comparison: One-view chest x-ray 03/26/2019

CLINICAL DATA: Pulmonary edema.

EXAM:
PORTABLE CHEST 1 VIEW

[chest ap]
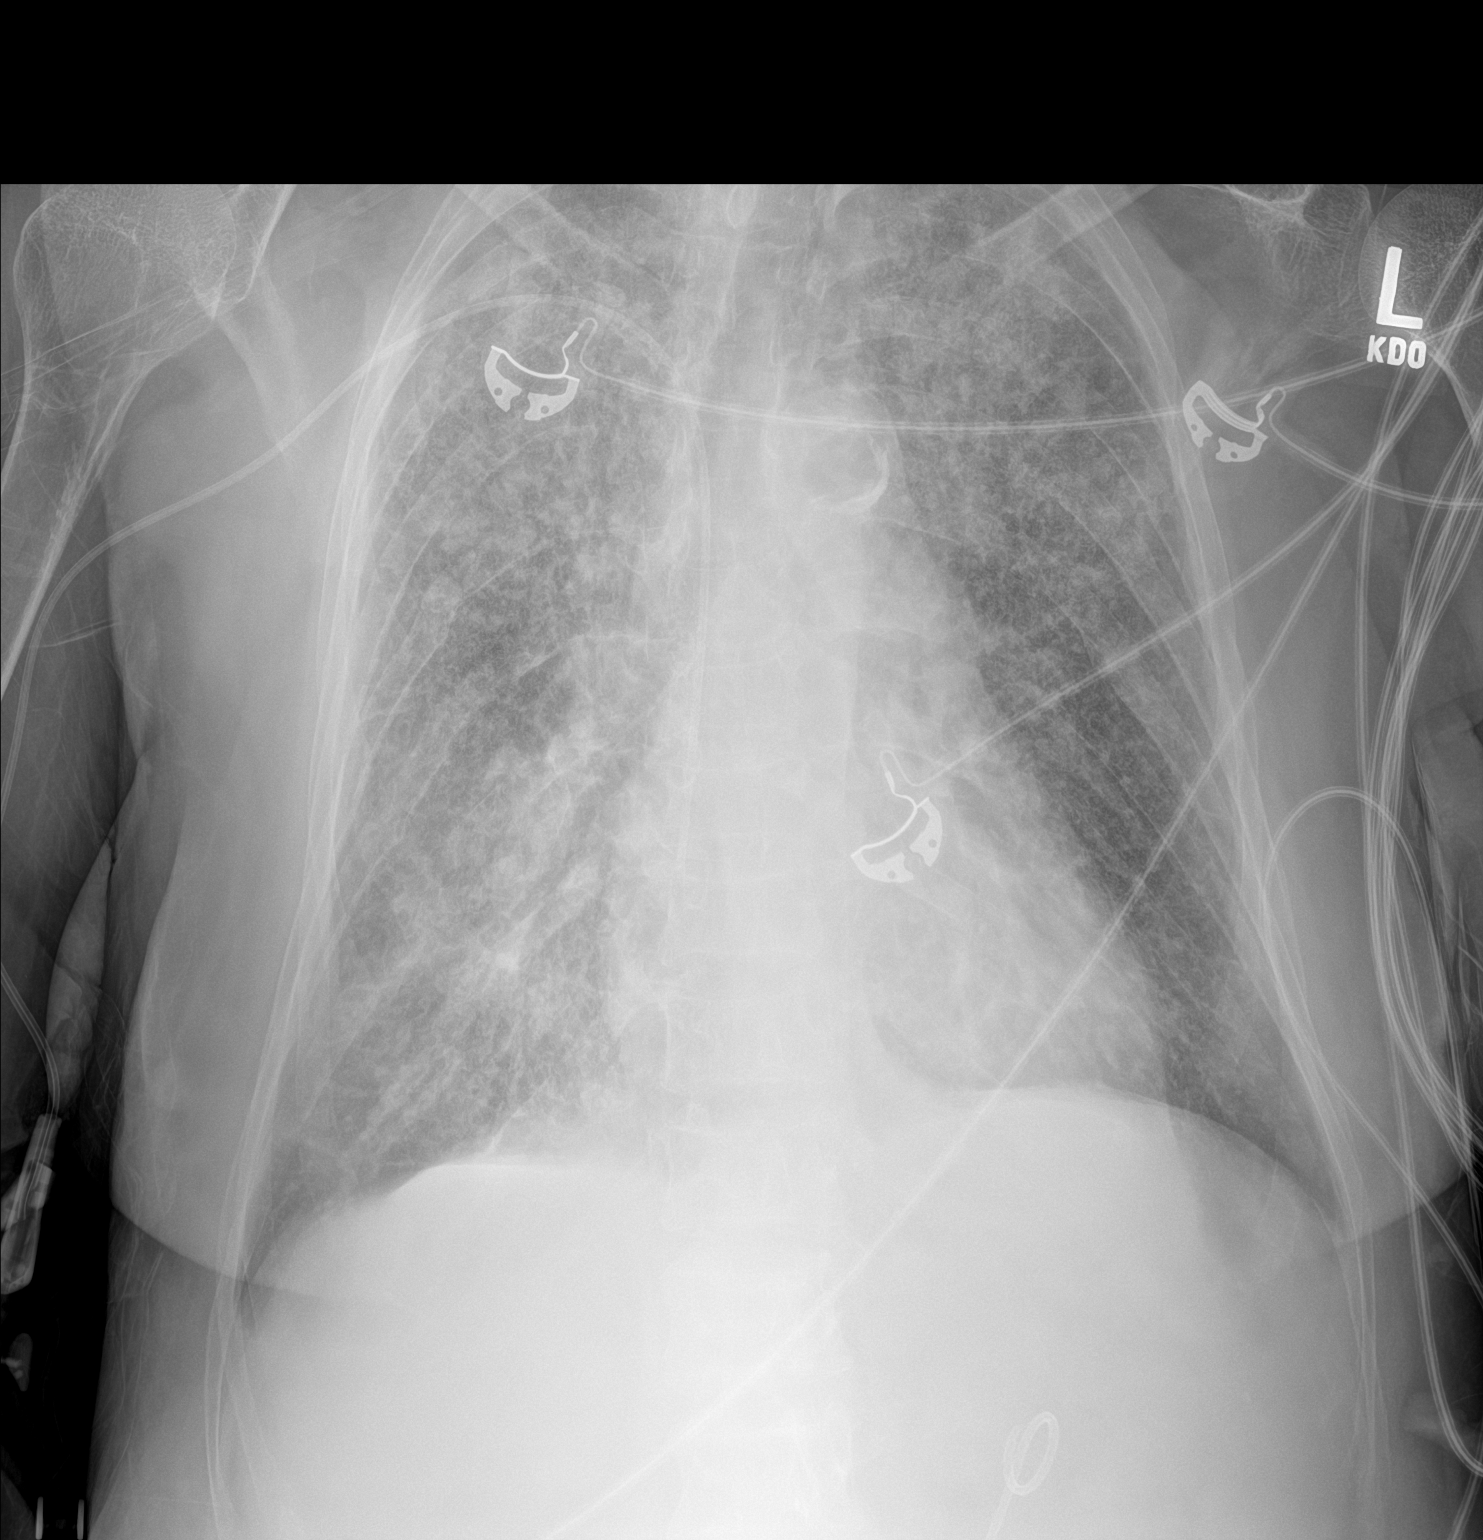

[1 of 1 positions shown; findings below may reference images not displayed]

FINDINGS: Heart size is normal. Atherosclerotic changes are noted at the
aorta.

Diffuse interstitial and airspace disease is superimposed on chronic
COPD. Aeration is slightly improved since yesterday's study. Remote
right-sided rib fractures are again seen.
IMPRESSION: 1. Slight improved aeration of both lungs.
2. Persistent diffuse interstitial and airspace disease superimposed
on COPD. This likely reflects edema. Infection is not excluded.
3. Aortic atherosclerosis.

## 2019-07-29 DIAGNOSIS — Z0000 Encounter for general adult medical examination without abnormal findings: Secondary | ICD-10-CM | POA: Diagnosis not present

## 2019-07-29 DIAGNOSIS — Z9181 History of falling: Secondary | ICD-10-CM | POA: Diagnosis not present

## 2019-07-29 DIAGNOSIS — E785 Hyperlipidemia, unspecified: Secondary | ICD-10-CM | POA: Diagnosis not present

## 2019-07-31 DIAGNOSIS — J449 Chronic obstructive pulmonary disease, unspecified: Secondary | ICD-10-CM | POA: Diagnosis not present

## 2019-08-03 DIAGNOSIS — R339 Retention of urine, unspecified: Secondary | ICD-10-CM | POA: Diagnosis not present

## 2019-08-03 DIAGNOSIS — I129 Hypertensive chronic kidney disease with stage 1 through stage 4 chronic kidney disease, or unspecified chronic kidney disease: Secondary | ICD-10-CM | POA: Diagnosis not present

## 2019-08-03 DIAGNOSIS — N390 Urinary tract infection, site not specified: Secondary | ICD-10-CM | POA: Diagnosis not present

## 2019-08-03 DIAGNOSIS — N2581 Secondary hyperparathyroidism of renal origin: Secondary | ICD-10-CM | POA: Diagnosis not present

## 2019-08-03 DIAGNOSIS — D631 Anemia in chronic kidney disease: Secondary | ICD-10-CM | POA: Diagnosis not present

## 2019-08-03 DIAGNOSIS — N183 Chronic kidney disease, stage 3 (moderate): Secondary | ICD-10-CM | POA: Diagnosis not present

## 2019-08-03 DIAGNOSIS — N184 Chronic kidney disease, stage 4 (severe): Secondary | ICD-10-CM | POA: Diagnosis not present

## 2019-08-03 DIAGNOSIS — Z1159 Encounter for screening for other viral diseases: Secondary | ICD-10-CM | POA: Diagnosis not present

## 2019-08-03 DIAGNOSIS — N189 Chronic kidney disease, unspecified: Secondary | ICD-10-CM | POA: Diagnosis not present

## 2019-08-13 DIAGNOSIS — N184 Chronic kidney disease, stage 4 (severe): Secondary | ICD-10-CM | POA: Diagnosis not present

## 2019-08-13 DIAGNOSIS — M545 Low back pain: Secondary | ICD-10-CM | POA: Diagnosis not present

## 2019-08-13 DIAGNOSIS — M79605 Pain in left leg: Secondary | ICD-10-CM | POA: Diagnosis not present

## 2019-08-13 DIAGNOSIS — M79604 Pain in right leg: Secondary | ICD-10-CM | POA: Diagnosis not present

## 2019-08-19 DIAGNOSIS — N390 Urinary tract infection, site not specified: Secondary | ICD-10-CM | POA: Diagnosis not present

## 2019-08-20 ENCOUNTER — Other Ambulatory Visit (HOSPITAL_COMMUNITY): Payer: Self-pay | Admitting: Family

## 2019-08-20 DIAGNOSIS — I771 Stricture of artery: Secondary | ICD-10-CM

## 2019-08-21 ENCOUNTER — Ambulatory Visit: Payer: Medicare Other | Admitting: Cardiology

## 2019-08-23 DIAGNOSIS — R4701 Aphasia: Secondary | ICD-10-CM | POA: Diagnosis not present

## 2019-08-23 DIAGNOSIS — E43 Unspecified severe protein-calorie malnutrition: Secondary | ICD-10-CM | POA: Diagnosis not present

## 2019-08-23 DIAGNOSIS — R918 Other nonspecific abnormal finding of lung field: Secondary | ICD-10-CM | POA: Diagnosis not present

## 2019-08-23 DIAGNOSIS — E785 Hyperlipidemia, unspecified: Secondary | ICD-10-CM | POA: Diagnosis not present

## 2019-08-23 DIAGNOSIS — N119 Chronic tubulo-interstitial nephritis, unspecified: Secondary | ICD-10-CM | POA: Diagnosis not present

## 2019-08-23 DIAGNOSIS — B952 Enterococcus as the cause of diseases classified elsewhere: Secondary | ICD-10-CM | POA: Diagnosis not present

## 2019-08-23 DIAGNOSIS — J449 Chronic obstructive pulmonary disease, unspecified: Secondary | ICD-10-CM | POA: Diagnosis not present

## 2019-08-23 DIAGNOSIS — J181 Lobar pneumonia, unspecified organism: Secondary | ICD-10-CM | POA: Diagnosis not present

## 2019-08-23 DIAGNOSIS — N319 Neuromuscular dysfunction of bladder, unspecified: Secondary | ICD-10-CM | POA: Diagnosis not present

## 2019-08-23 DIAGNOSIS — G4733 Obstructive sleep apnea (adult) (pediatric): Secondary | ICD-10-CM | POA: Diagnosis not present

## 2019-08-23 DIAGNOSIS — I129 Hypertensive chronic kidney disease with stage 1 through stage 4 chronic kidney disease, or unspecified chronic kidney disease: Secondary | ICD-10-CM | POA: Diagnosis not present

## 2019-08-23 DIAGNOSIS — D696 Thrombocytopenia, unspecified: Secondary | ICD-10-CM | POA: Diagnosis not present

## 2019-08-23 DIAGNOSIS — N390 Urinary tract infection, site not specified: Secondary | ICD-10-CM | POA: Diagnosis not present

## 2019-08-23 DIAGNOSIS — Z881 Allergy status to other antibiotic agents status: Secondary | ICD-10-CM | POA: Diagnosis not present

## 2019-08-23 DIAGNOSIS — N179 Acute kidney failure, unspecified: Secondary | ICD-10-CM | POA: Diagnosis not present

## 2019-08-23 DIAGNOSIS — N183 Chronic kidney disease, stage 3 (moderate): Secondary | ICD-10-CM | POA: Diagnosis not present

## 2019-08-23 DIAGNOSIS — R410 Disorientation, unspecified: Secondary | ICD-10-CM | POA: Diagnosis not present

## 2019-08-23 DIAGNOSIS — D631 Anemia in chronic kidney disease: Secondary | ICD-10-CM | POA: Diagnosis not present

## 2019-08-23 DIAGNOSIS — G9341 Metabolic encephalopathy: Secondary | ICD-10-CM | POA: Diagnosis not present

## 2019-08-23 DIAGNOSIS — Z03818 Encounter for observation for suspected exposure to other biological agents ruled out: Secondary | ICD-10-CM | POA: Diagnosis not present

## 2019-08-23 DIAGNOSIS — T83511A Infection and inflammatory reaction due to indwelling urethral catheter, initial encounter: Secondary | ICD-10-CM | POA: Diagnosis not present

## 2019-08-23 DIAGNOSIS — M1990 Unspecified osteoarthritis, unspecified site: Secondary | ICD-10-CM | POA: Diagnosis not present

## 2019-08-23 DIAGNOSIS — N131 Hydronephrosis with ureteral stricture, not elsewhere classified: Secondary | ICD-10-CM | POA: Diagnosis not present

## 2019-08-23 DIAGNOSIS — Z885 Allergy status to narcotic agent status: Secondary | ICD-10-CM | POA: Diagnosis not present

## 2019-08-23 DIAGNOSIS — E872 Acidosis: Secondary | ICD-10-CM | POA: Diagnosis not present

## 2019-08-23 DIAGNOSIS — G309 Alzheimer's disease, unspecified: Secondary | ICD-10-CM | POA: Diagnosis not present

## 2019-08-23 DIAGNOSIS — Z79899 Other long term (current) drug therapy: Secondary | ICD-10-CM | POA: Diagnosis not present

## 2019-08-23 DIAGNOSIS — Z87440 Personal history of urinary (tract) infections: Secondary | ICD-10-CM | POA: Diagnosis not present

## 2019-08-24 DIAGNOSIS — N179 Acute kidney failure, unspecified: Secondary | ICD-10-CM | POA: Diagnosis not present

## 2019-08-24 DIAGNOSIS — N119 Chronic tubulo-interstitial nephritis, unspecified: Secondary | ICD-10-CM | POA: Diagnosis not present

## 2019-08-24 DIAGNOSIS — E43 Unspecified severe protein-calorie malnutrition: Secondary | ICD-10-CM | POA: Diagnosis not present

## 2019-08-24 DIAGNOSIS — T83511A Infection and inflammatory reaction due to indwelling urethral catheter, initial encounter: Secondary | ICD-10-CM | POA: Diagnosis not present

## 2019-08-25 DIAGNOSIS — N179 Acute kidney failure, unspecified: Secondary | ICD-10-CM | POA: Diagnosis not present

## 2019-08-25 DIAGNOSIS — E43 Unspecified severe protein-calorie malnutrition: Secondary | ICD-10-CM | POA: Diagnosis not present

## 2019-08-25 DIAGNOSIS — T83511A Infection and inflammatory reaction due to indwelling urethral catheter, initial encounter: Secondary | ICD-10-CM | POA: Diagnosis not present

## 2019-08-25 DIAGNOSIS — N119 Chronic tubulo-interstitial nephritis, unspecified: Secondary | ICD-10-CM | POA: Diagnosis not present

## 2019-08-26 ENCOUNTER — Ambulatory Visit (HOSPITAL_COMMUNITY): Payer: Medicare Other

## 2019-08-26 DIAGNOSIS — N119 Chronic tubulo-interstitial nephritis, unspecified: Secondary | ICD-10-CM | POA: Diagnosis not present

## 2019-08-26 DIAGNOSIS — E43 Unspecified severe protein-calorie malnutrition: Secondary | ICD-10-CM | POA: Diagnosis not present

## 2019-08-26 DIAGNOSIS — T83511A Infection and inflammatory reaction due to indwelling urethral catheter, initial encounter: Secondary | ICD-10-CM | POA: Diagnosis not present

## 2019-08-26 DIAGNOSIS — N179 Acute kidney failure, unspecified: Secondary | ICD-10-CM | POA: Diagnosis not present

## 2019-08-27 DIAGNOSIS — T83511A Infection and inflammatory reaction due to indwelling urethral catheter, initial encounter: Secondary | ICD-10-CM | POA: Diagnosis not present

## 2019-08-27 DIAGNOSIS — E43 Unspecified severe protein-calorie malnutrition: Secondary | ICD-10-CM | POA: Diagnosis not present

## 2019-08-27 DIAGNOSIS — N119 Chronic tubulo-interstitial nephritis, unspecified: Secondary | ICD-10-CM | POA: Diagnosis not present

## 2019-08-27 DIAGNOSIS — N179 Acute kidney failure, unspecified: Secondary | ICD-10-CM | POA: Diagnosis not present

## 2019-08-28 ENCOUNTER — Encounter (HOSPITAL_COMMUNITY): Payer: Medicare Other

## 2019-08-31 DIAGNOSIS — J449 Chronic obstructive pulmonary disease, unspecified: Secondary | ICD-10-CM | POA: Diagnosis not present

## 2019-09-01 DIAGNOSIS — N1330 Unspecified hydronephrosis: Secondary | ICD-10-CM | POA: Diagnosis not present

## 2019-09-01 DIAGNOSIS — N135 Crossing vessel and stricture of ureter without hydronephrosis: Secondary | ICD-10-CM | POA: Diagnosis not present

## 2019-09-01 DIAGNOSIS — E7800 Pure hypercholesterolemia, unspecified: Secondary | ICD-10-CM | POA: Diagnosis not present

## 2019-09-01 DIAGNOSIS — K5900 Constipation, unspecified: Secondary | ICD-10-CM | POA: Diagnosis not present

## 2019-09-01 DIAGNOSIS — R339 Retention of urine, unspecified: Secondary | ICD-10-CM | POA: Diagnosis not present

## 2019-09-01 DIAGNOSIS — Z9981 Dependence on supplemental oxygen: Secondary | ICD-10-CM | POA: Diagnosis not present

## 2019-09-01 DIAGNOSIS — N131 Hydronephrosis with ureteral stricture, not elsewhere classified: Secondary | ICD-10-CM | POA: Diagnosis not present

## 2019-09-01 DIAGNOSIS — I10 Essential (primary) hypertension: Secondary | ICD-10-CM | POA: Diagnosis not present

## 2019-09-01 DIAGNOSIS — G309 Alzheimer's disease, unspecified: Secondary | ICD-10-CM | POA: Diagnosis not present

## 2019-09-01 DIAGNOSIS — Z8673 Personal history of transient ischemic attack (TIA), and cerebral infarction without residual deficits: Secondary | ICD-10-CM | POA: Diagnosis not present

## 2019-09-01 DIAGNOSIS — J449 Chronic obstructive pulmonary disease, unspecified: Secondary | ICD-10-CM | POA: Diagnosis not present

## 2019-09-01 DIAGNOSIS — N319 Neuromuscular dysfunction of bladder, unspecified: Secondary | ICD-10-CM | POA: Diagnosis not present

## 2019-09-01 DIAGNOSIS — Z79899 Other long term (current) drug therapy: Secondary | ICD-10-CM | POA: Diagnosis not present

## 2019-09-01 DIAGNOSIS — R109 Unspecified abdominal pain: Secondary | ICD-10-CM | POA: Diagnosis not present

## 2019-09-14 ENCOUNTER — Ambulatory Visit: Payer: Medicare Other | Admitting: Cardiology

## 2019-09-15 DIAGNOSIS — H25812 Combined forms of age-related cataract, left eye: Secondary | ICD-10-CM | POA: Diagnosis not present

## 2019-09-15 DIAGNOSIS — Z01818 Encounter for other preprocedural examination: Secondary | ICD-10-CM | POA: Diagnosis not present

## 2019-09-23 DIAGNOSIS — R42 Dizziness and giddiness: Secondary | ICD-10-CM | POA: Diagnosis not present

## 2019-09-23 DIAGNOSIS — I10 Essential (primary) hypertension: Secondary | ICD-10-CM | POA: Diagnosis not present

## 2019-09-23 DIAGNOSIS — N184 Chronic kidney disease, stage 4 (severe): Secondary | ICD-10-CM | POA: Diagnosis not present

## 2019-09-30 DIAGNOSIS — J449 Chronic obstructive pulmonary disease, unspecified: Secondary | ICD-10-CM | POA: Diagnosis not present

## 2019-10-06 DIAGNOSIS — Z8673 Personal history of transient ischemic attack (TIA), and cerebral infarction without residual deficits: Secondary | ICD-10-CM | POA: Diagnosis not present

## 2019-10-06 DIAGNOSIS — G309 Alzheimer's disease, unspecified: Secondary | ICD-10-CM | POA: Diagnosis not present

## 2019-10-06 DIAGNOSIS — Z8669 Personal history of other diseases of the nervous system and sense organs: Secondary | ICD-10-CM | POA: Diagnosis not present

## 2019-10-06 DIAGNOSIS — Z9981 Dependence on supplemental oxygen: Secondary | ICD-10-CM | POA: Diagnosis not present

## 2019-10-06 DIAGNOSIS — H25812 Combined forms of age-related cataract, left eye: Secondary | ICD-10-CM | POA: Diagnosis not present

## 2019-10-06 DIAGNOSIS — H259 Unspecified age-related cataract: Secondary | ICD-10-CM | POA: Diagnosis not present

## 2019-10-06 DIAGNOSIS — G92 Toxic encephalopathy: Secondary | ICD-10-CM | POA: Diagnosis not present

## 2019-10-06 DIAGNOSIS — I10 Essential (primary) hypertension: Secondary | ICD-10-CM | POA: Diagnosis not present

## 2019-10-06 DIAGNOSIS — J449 Chronic obstructive pulmonary disease, unspecified: Secondary | ICD-10-CM | POA: Diagnosis not present

## 2019-10-06 DIAGNOSIS — E785 Hyperlipidemia, unspecified: Secondary | ICD-10-CM | POA: Diagnosis not present

## 2019-10-08 DIAGNOSIS — N319 Neuromuscular dysfunction of bladder, unspecified: Secondary | ICD-10-CM | POA: Diagnosis not present

## 2019-10-08 DIAGNOSIS — N390 Urinary tract infection, site not specified: Secondary | ICD-10-CM | POA: Diagnosis not present

## 2019-10-08 DIAGNOSIS — R339 Retention of urine, unspecified: Secondary | ICD-10-CM | POA: Diagnosis not present

## 2019-10-13 DIAGNOSIS — R197 Diarrhea, unspecified: Secondary | ICD-10-CM | POA: Diagnosis not present

## 2019-10-13 DIAGNOSIS — E785 Hyperlipidemia, unspecified: Secondary | ICD-10-CM | POA: Diagnosis not present

## 2019-10-13 DIAGNOSIS — R748 Abnormal levels of other serum enzymes: Secondary | ICD-10-CM | POA: Diagnosis not present

## 2019-10-13 DIAGNOSIS — E876 Hypokalemia: Secondary | ICD-10-CM | POA: Diagnosis not present

## 2019-10-13 DIAGNOSIS — G309 Alzheimer's disease, unspecified: Secondary | ICD-10-CM | POA: Diagnosis not present

## 2019-10-13 DIAGNOSIS — I129 Hypertensive chronic kidney disease with stage 1 through stage 4 chronic kidney disease, or unspecified chronic kidney disease: Secondary | ICD-10-CM | POA: Diagnosis not present

## 2019-10-13 DIAGNOSIS — N319 Neuromuscular dysfunction of bladder, unspecified: Secondary | ICD-10-CM | POA: Diagnosis not present

## 2019-10-13 DIAGNOSIS — N390 Urinary tract infection, site not specified: Secondary | ICD-10-CM | POA: Diagnosis not present

## 2019-10-13 DIAGNOSIS — E782 Mixed hyperlipidemia: Secondary | ICD-10-CM | POA: Diagnosis not present

## 2019-10-13 DIAGNOSIS — E872 Acidosis: Secondary | ICD-10-CM | POA: Diagnosis not present

## 2019-10-13 DIAGNOSIS — B9620 Unspecified Escherichia coli [E. coli] as the cause of diseases classified elsewhere: Secondary | ICD-10-CM | POA: Diagnosis not present

## 2019-10-13 DIAGNOSIS — K859 Acute pancreatitis without necrosis or infection, unspecified: Secondary | ICD-10-CM | POA: Diagnosis not present

## 2019-10-13 DIAGNOSIS — R531 Weakness: Secondary | ICD-10-CM | POA: Diagnosis not present

## 2019-10-13 DIAGNOSIS — B9689 Other specified bacterial agents as the cause of diseases classified elsewhere: Secondary | ICD-10-CM | POA: Diagnosis not present

## 2019-10-13 DIAGNOSIS — Z87440 Personal history of urinary (tract) infections: Secondary | ICD-10-CM | POA: Diagnosis not present

## 2019-10-13 DIAGNOSIS — E43 Unspecified severe protein-calorie malnutrition: Secondary | ICD-10-CM | POA: Diagnosis not present

## 2019-10-13 DIAGNOSIS — Z881 Allergy status to other antibiotic agents status: Secondary | ICD-10-CM | POA: Diagnosis not present

## 2019-10-13 DIAGNOSIS — D631 Anemia in chronic kidney disease: Secondary | ICD-10-CM | POA: Diagnosis not present

## 2019-10-13 DIAGNOSIS — Z885 Allergy status to narcotic agent status: Secondary | ICD-10-CM | POA: Diagnosis not present

## 2019-10-13 DIAGNOSIS — M1990 Unspecified osteoarthritis, unspecified site: Secondary | ICD-10-CM | POA: Diagnosis not present

## 2019-10-13 DIAGNOSIS — R5383 Other fatigue: Secondary | ICD-10-CM | POA: Diagnosis not present

## 2019-10-13 DIAGNOSIS — N183 Chronic kidney disease, stage 3 unspecified: Secondary | ICD-10-CM | POA: Diagnosis not present

## 2019-10-13 DIAGNOSIS — N1330 Unspecified hydronephrosis: Secondary | ICD-10-CM | POA: Diagnosis not present

## 2019-10-13 DIAGNOSIS — J449 Chronic obstructive pulmonary disease, unspecified: Secondary | ICD-10-CM | POA: Diagnosis not present

## 2019-10-13 DIAGNOSIS — T83518A Infection and inflammatory reaction due to other urinary catheter, initial encounter: Secondary | ICD-10-CM | POA: Diagnosis not present

## 2019-10-14 ENCOUNTER — Ambulatory Visit: Payer: Medicare Other | Admitting: Cardiology

## 2019-10-19 DIAGNOSIS — R4020 Unspecified coma: Secondary | ICD-10-CM | POA: Diagnosis not present

## 2019-10-19 DIAGNOSIS — R52 Pain, unspecified: Secondary | ICD-10-CM | POA: Diagnosis not present

## 2019-10-19 DIAGNOSIS — R55 Syncope and collapse: Secondary | ICD-10-CM | POA: Diagnosis not present

## 2019-10-19 DIAGNOSIS — R112 Nausea with vomiting, unspecified: Secondary | ICD-10-CM | POA: Diagnosis not present

## 2019-10-19 DIAGNOSIS — R0902 Hypoxemia: Secondary | ICD-10-CM | POA: Diagnosis not present

## 2019-10-19 DIAGNOSIS — R1084 Generalized abdominal pain: Secondary | ICD-10-CM | POA: Diagnosis not present

## 2019-10-20 DIAGNOSIS — N390 Urinary tract infection, site not specified: Secondary | ICD-10-CM | POA: Diagnosis not present

## 2019-10-20 DIAGNOSIS — T3695XA Adverse effect of unspecified systemic antibiotic, initial encounter: Secondary | ICD-10-CM | POA: Diagnosis not present

## 2019-10-20 DIAGNOSIS — B965 Pseudomonas (aeruginosa) (mallei) (pseudomallei) as the cause of diseases classified elsewhere: Secondary | ICD-10-CM | POA: Diagnosis not present

## 2019-10-20 DIAGNOSIS — R109 Unspecified abdominal pain: Secondary | ICD-10-CM | POA: Diagnosis not present

## 2019-10-20 DIAGNOSIS — K219 Gastro-esophageal reflux disease without esophagitis: Secondary | ICD-10-CM | POA: Diagnosis not present

## 2019-10-20 DIAGNOSIS — R1084 Generalized abdominal pain: Secondary | ICD-10-CM | POA: Diagnosis not present

## 2019-10-20 DIAGNOSIS — R339 Retention of urine, unspecified: Secondary | ICD-10-CM | POA: Diagnosis not present

## 2019-10-20 DIAGNOSIS — R197 Diarrhea, unspecified: Secondary | ICD-10-CM | POA: Diagnosis not present

## 2019-10-20 DIAGNOSIS — D631 Anemia in chronic kidney disease: Secondary | ICD-10-CM | POA: Diagnosis not present

## 2019-10-20 DIAGNOSIS — N183 Chronic kidney disease, stage 3 unspecified: Secondary | ICD-10-CM | POA: Diagnosis not present

## 2019-10-20 DIAGNOSIS — I129 Hypertensive chronic kidney disease with stage 1 through stage 4 chronic kidney disease, or unspecified chronic kidney disease: Secondary | ICD-10-CM | POA: Diagnosis not present

## 2019-10-20 DIAGNOSIS — E559 Vitamin D deficiency, unspecified: Secondary | ICD-10-CM | POA: Diagnosis not present

## 2019-10-20 DIAGNOSIS — R1110 Vomiting, unspecified: Secondary | ICD-10-CM | POA: Diagnosis not present

## 2019-10-20 DIAGNOSIS — E876 Hypokalemia: Secondary | ICD-10-CM | POA: Diagnosis not present

## 2019-10-20 DIAGNOSIS — N135 Crossing vessel and stricture of ureter without hydronephrosis: Secondary | ICD-10-CM | POA: Diagnosis not present

## 2019-10-20 DIAGNOSIS — R112 Nausea with vomiting, unspecified: Secondary | ICD-10-CM | POA: Diagnosis not present

## 2019-10-20 DIAGNOSIS — N131 Hydronephrosis with ureteral stricture, not elsewhere classified: Secondary | ICD-10-CM | POA: Diagnosis not present

## 2019-10-20 DIAGNOSIS — Z87440 Personal history of urinary (tract) infections: Secondary | ICD-10-CM | POA: Diagnosis not present

## 2019-10-20 DIAGNOSIS — D638 Anemia in other chronic diseases classified elsewhere: Secondary | ICD-10-CM | POA: Diagnosis not present

## 2019-10-20 DIAGNOSIS — R9431 Abnormal electrocardiogram [ECG] [EKG]: Secondary | ICD-10-CM | POA: Diagnosis not present

## 2019-10-20 DIAGNOSIS — G309 Alzheimer's disease, unspecified: Secondary | ICD-10-CM | POA: Diagnosis not present

## 2019-10-20 DIAGNOSIS — E7800 Pure hypercholesterolemia, unspecified: Secondary | ICD-10-CM | POA: Diagnosis not present

## 2019-10-20 DIAGNOSIS — E43 Unspecified severe protein-calorie malnutrition: Secondary | ICD-10-CM | POA: Diagnosis not present

## 2019-10-20 DIAGNOSIS — J449 Chronic obstructive pulmonary disease, unspecified: Secondary | ICD-10-CM | POA: Diagnosis not present

## 2019-10-20 DIAGNOSIS — K521 Toxic gastroenteritis and colitis: Secondary | ICD-10-CM | POA: Diagnosis not present

## 2019-10-20 DIAGNOSIS — M1990 Unspecified osteoarthritis, unspecified site: Secondary | ICD-10-CM | POA: Diagnosis not present

## 2019-10-20 DIAGNOSIS — R0602 Shortness of breath: Secondary | ICD-10-CM | POA: Diagnosis not present

## 2019-10-20 DIAGNOSIS — Z23 Encounter for immunization: Secondary | ICD-10-CM | POA: Diagnosis not present

## 2019-10-20 DIAGNOSIS — T83511A Infection and inflammatory reaction due to indwelling urethral catheter, initial encounter: Secondary | ICD-10-CM | POA: Diagnosis not present

## 2019-10-20 DIAGNOSIS — N319 Neuromuscular dysfunction of bladder, unspecified: Secondary | ICD-10-CM | POA: Diagnosis not present

## 2019-10-20 DIAGNOSIS — T83518A Infection and inflammatory reaction due to other urinary catheter, initial encounter: Secondary | ICD-10-CM | POA: Diagnosis not present

## 2019-10-20 DIAGNOSIS — R935 Abnormal findings on diagnostic imaging of other abdominal regions, including retroperitoneum: Secondary | ICD-10-CM | POA: Diagnosis not present

## 2019-10-20 DIAGNOSIS — R42 Dizziness and giddiness: Secondary | ICD-10-CM | POA: Diagnosis not present

## 2019-10-20 DIAGNOSIS — J439 Emphysema, unspecified: Secondary | ICD-10-CM | POA: Diagnosis not present

## 2019-10-22 DIAGNOSIS — R0602 Shortness of breath: Secondary | ICD-10-CM

## 2019-11-02 DIAGNOSIS — N183 Chronic kidney disease, stage 3 unspecified: Secondary | ICD-10-CM | POA: Diagnosis not present

## 2019-11-02 DIAGNOSIS — R112 Nausea with vomiting, unspecified: Secondary | ICD-10-CM | POA: Diagnosis not present

## 2019-11-02 DIAGNOSIS — R935 Abnormal findings on diagnostic imaging of other abdominal regions, including retroperitoneum: Secondary | ICD-10-CM | POA: Diagnosis not present

## 2019-11-02 DIAGNOSIS — N390 Urinary tract infection, site not specified: Secondary | ICD-10-CM | POA: Diagnosis not present

## 2019-11-18 DIAGNOSIS — N135 Crossing vessel and stricture of ureter without hydronephrosis: Secondary | ICD-10-CM | POA: Diagnosis not present

## 2019-11-18 DIAGNOSIS — N390 Urinary tract infection, site not specified: Secondary | ICD-10-CM | POA: Diagnosis not present

## 2019-11-18 DIAGNOSIS — G4700 Insomnia, unspecified: Secondary | ICD-10-CM | POA: Diagnosis not present

## 2019-11-18 DIAGNOSIS — Z09 Encounter for follow-up examination after completed treatment for conditions other than malignant neoplasm: Secondary | ICD-10-CM | POA: Diagnosis not present

## 2019-11-18 DIAGNOSIS — R339 Retention of urine, unspecified: Secondary | ICD-10-CM | POA: Diagnosis not present

## 2019-11-30 DIAGNOSIS — J449 Chronic obstructive pulmonary disease, unspecified: Secondary | ICD-10-CM | POA: Diagnosis not present

## 2019-12-07 ENCOUNTER — Emergency Department (HOSPITAL_COMMUNITY): Payer: Medicare Other

## 2019-12-07 ENCOUNTER — Other Ambulatory Visit: Payer: Self-pay

## 2019-12-07 ENCOUNTER — Inpatient Hospital Stay (HOSPITAL_COMMUNITY): Payer: Medicare Other

## 2019-12-07 ENCOUNTER — Inpatient Hospital Stay (HOSPITAL_COMMUNITY)
Admission: EM | Admit: 2019-12-07 | Discharge: 2019-12-14 | DRG: 698 | Disposition: A | Discharge status: Home Health | Payer: Medicare Other | Attending: Internal Medicine | Admitting: Internal Medicine

## 2019-12-07 DIAGNOSIS — R935 Abnormal findings on diagnostic imaging of other abdominal regions, including retroperitoneum: Secondary | ICD-10-CM | POA: Diagnosis not present

## 2019-12-07 DIAGNOSIS — N183 Chronic kidney disease, stage 3 unspecified: Secondary | ICD-10-CM | POA: Diagnosis not present

## 2019-12-07 DIAGNOSIS — E785 Hyperlipidemia, unspecified: Secondary | ICD-10-CM | POA: Diagnosis not present

## 2019-12-07 DIAGNOSIS — R0602 Shortness of breath: Secondary | ICD-10-CM | POA: Diagnosis not present

## 2019-12-07 DIAGNOSIS — N179 Acute kidney failure, unspecified: Secondary | ICD-10-CM | POA: Diagnosis not present

## 2019-12-07 DIAGNOSIS — Z743 Need for continuous supervision: Secondary | ICD-10-CM | POA: Diagnosis not present

## 2019-12-07 DIAGNOSIS — E875 Hyperkalemia: Secondary | ICD-10-CM

## 2019-12-07 DIAGNOSIS — I129 Hypertensive chronic kidney disease with stage 1 through stage 4 chronic kidney disease, or unspecified chronic kidney disease: Secondary | ICD-10-CM | POA: Diagnosis present

## 2019-12-07 DIAGNOSIS — D631 Anemia in chronic kidney disease: Secondary | ICD-10-CM | POA: Diagnosis not present

## 2019-12-07 DIAGNOSIS — N136 Pyonephrosis: Secondary | ICD-10-CM | POA: Diagnosis not present

## 2019-12-07 DIAGNOSIS — D696 Thrombocytopenia, unspecified: Secondary | ICD-10-CM | POA: Diagnosis present

## 2019-12-07 DIAGNOSIS — R1084 Generalized abdominal pain: Secondary | ICD-10-CM | POA: Diagnosis not present

## 2019-12-07 DIAGNOSIS — Z8673 Personal history of transient ischemic attack (TIA), and cerebral infarction without residual deficits: Secondary | ICD-10-CM

## 2019-12-07 DIAGNOSIS — Z20822 Contact with and (suspected) exposure to covid-19: Secondary | ICD-10-CM | POA: Diagnosis not present

## 2019-12-07 DIAGNOSIS — J449 Chronic obstructive pulmonary disease, unspecified: Secondary | ICD-10-CM | POA: Diagnosis present

## 2019-12-07 DIAGNOSIS — Z8614 Personal history of Methicillin resistant Staphylococcus aureus infection: Secondary | ICD-10-CM | POA: Diagnosis not present

## 2019-12-07 DIAGNOSIS — R1111 Vomiting without nausea: Secondary | ICD-10-CM | POA: Diagnosis not present

## 2019-12-07 DIAGNOSIS — F17210 Nicotine dependence, cigarettes, uncomplicated: Secondary | ICD-10-CM | POA: Diagnosis present

## 2019-12-07 DIAGNOSIS — Y846 Urinary catheterization as the cause of abnormal reaction of the patient, or of later complication, without mention of misadventure at the time of the procedure: Secondary | ICD-10-CM | POA: Diagnosis present

## 2019-12-07 DIAGNOSIS — R1110 Vomiting, unspecified: Secondary | ICD-10-CM | POA: Diagnosis not present

## 2019-12-07 DIAGNOSIS — Z89422 Acquired absence of other left toe(s): Secondary | ICD-10-CM

## 2019-12-07 DIAGNOSIS — Z87442 Personal history of urinary calculi: Secondary | ICD-10-CM

## 2019-12-07 DIAGNOSIS — E876 Hypokalemia: Secondary | ICD-10-CM | POA: Diagnosis present

## 2019-12-07 DIAGNOSIS — Z9049 Acquired absence of other specified parts of digestive tract: Secondary | ICD-10-CM

## 2019-12-07 DIAGNOSIS — G309 Alzheimer's disease, unspecified: Secondary | ICD-10-CM | POA: Diagnosis not present

## 2019-12-07 DIAGNOSIS — Z90710 Acquired absence of both cervix and uterus: Secondary | ICD-10-CM

## 2019-12-07 DIAGNOSIS — F0280 Dementia in other diseases classified elsewhere without behavioral disturbance: Secondary | ICD-10-CM | POA: Diagnosis present

## 2019-12-07 DIAGNOSIS — R569 Unspecified convulsions: Secondary | ICD-10-CM | POA: Diagnosis not present

## 2019-12-07 DIAGNOSIS — Z79899 Other long term (current) drug therapy: Secondary | ICD-10-CM

## 2019-12-07 DIAGNOSIS — Z885 Allergy status to narcotic agent status: Secondary | ICD-10-CM

## 2019-12-07 DIAGNOSIS — Z888 Allergy status to other drugs, medicaments and biological substances status: Secondary | ICD-10-CM | POA: Diagnosis not present

## 2019-12-07 DIAGNOSIS — R9431 Abnormal electrocardiogram [ECG] [EKG]: Secondary | ICD-10-CM

## 2019-12-07 DIAGNOSIS — T83511A Infection and inflammatory reaction due to indwelling urethral catheter, initial encounter: Secondary | ICD-10-CM

## 2019-12-07 DIAGNOSIS — I10 Essential (primary) hypertension: Secondary | ICD-10-CM | POA: Diagnosis not present

## 2019-12-07 DIAGNOSIS — G4700 Insomnia, unspecified: Secondary | ICD-10-CM | POA: Diagnosis present

## 2019-12-07 DIAGNOSIS — K859 Acute pancreatitis without necrosis or infection, unspecified: Secondary | ICD-10-CM | POA: Diagnosis present

## 2019-12-07 DIAGNOSIS — Z791 Long term (current) use of non-steroidal anti-inflammatories (NSAID): Secondary | ICD-10-CM

## 2019-12-07 DIAGNOSIS — N390 Urinary tract infection, site not specified: Secondary | ICD-10-CM | POA: Diagnosis not present

## 2019-12-07 DIAGNOSIS — K851 Biliary acute pancreatitis without necrosis or infection: Secondary | ICD-10-CM | POA: Diagnosis not present

## 2019-12-07 DIAGNOSIS — E872 Acidosis: Secondary | ICD-10-CM | POA: Diagnosis not present

## 2019-12-07 DIAGNOSIS — D72829 Elevated white blood cell count, unspecified: Secondary | ICD-10-CM | POA: Diagnosis not present

## 2019-12-07 DIAGNOSIS — Z66 Do not resuscitate: Secondary | ICD-10-CM | POA: Diagnosis not present

## 2019-12-07 DIAGNOSIS — R1030 Lower abdominal pain, unspecified: Secondary | ICD-10-CM | POA: Diagnosis not present

## 2019-12-07 DIAGNOSIS — R112 Nausea with vomiting, unspecified: Secondary | ICD-10-CM | POA: Diagnosis not present

## 2019-12-07 DIAGNOSIS — K831 Obstruction of bile duct: Secondary | ICD-10-CM

## 2019-12-07 DIAGNOSIS — T83518A Infection and inflammatory reaction due to other urinary catheter, initial encounter: Secondary | ICD-10-CM | POA: Diagnosis not present

## 2019-12-07 DIAGNOSIS — Z91040 Latex allergy status: Secondary | ICD-10-CM | POA: Diagnosis not present

## 2019-12-07 DIAGNOSIS — T83511D Infection and inflammatory reaction due to indwelling urethral catheter, subsequent encounter: Secondary | ICD-10-CM | POA: Diagnosis not present

## 2019-12-07 DIAGNOSIS — Z9981 Dependence on supplemental oxygen: Secondary | ICD-10-CM

## 2019-12-07 DIAGNOSIS — I739 Peripheral vascular disease, unspecified: Secondary | ICD-10-CM | POA: Diagnosis present

## 2019-12-07 DIAGNOSIS — Z79890 Hormone replacement therapy (postmenopausal): Secondary | ICD-10-CM

## 2019-12-07 DIAGNOSIS — E860 Dehydration: Secondary | ICD-10-CM | POA: Diagnosis present

## 2019-12-07 DIAGNOSIS — R932 Abnormal findings on diagnostic imaging of liver and biliary tract: Secondary | ICD-10-CM | POA: Diagnosis not present

## 2019-12-07 DIAGNOSIS — Z79891 Long term (current) use of opiate analgesic: Secondary | ICD-10-CM

## 2019-12-07 LAB — CBC WITH DIFFERENTIAL/PLATELET
Abs Immature Granulocytes: 0.12 10*3/uL — ABNORMAL HIGH (ref 0.00–0.07)
Basophils Absolute: 0 10*3/uL (ref 0.0–0.1)
Basophils Relative: 0 %
Eosinophils Absolute: 0 10*3/uL (ref 0.0–0.5)
Eosinophils Relative: 0 %
HCT: 39.2 % (ref 36.0–46.0)
Hemoglobin: 12.1 g/dL (ref 12.0–15.0)
Immature Granulocytes: 1 %
Lymphocytes Relative: 11 %
Lymphs Abs: 1.7 10*3/uL (ref 0.7–4.0)
MCH: 30 pg (ref 26.0–34.0)
MCHC: 30.9 g/dL (ref 30.0–36.0)
MCV: 97 fL (ref 80.0–100.0)
Monocytes Absolute: 0.7 10*3/uL (ref 0.1–1.0)
Monocytes Relative: 5 %
Neutro Abs: 12.7 10*3/uL — ABNORMAL HIGH (ref 1.7–7.7)
Neutrophils Relative %: 83 %
Platelets: 242 10*3/uL (ref 150–400)
RBC: 4.04 MIL/uL (ref 3.87–5.11)
RDW: 16 % — ABNORMAL HIGH (ref 11.5–15.5)
WBC: 15.3 10*3/uL — ABNORMAL HIGH (ref 4.0–10.5)
nRBC: 0 % (ref 0.0–0.2)

## 2019-12-07 LAB — BASIC METABOLIC PANEL
Anion gap: 11 (ref 5–15)
BUN: 26 mg/dL — ABNORMAL HIGH (ref 8–23)
CO2: 15 mmol/L — ABNORMAL LOW (ref 22–32)
Calcium: 8.4 mg/dL — ABNORMAL LOW (ref 8.9–10.3)
Chloride: 109 mmol/L (ref 98–111)
Creatinine, Ser: 1.51 mg/dL — ABNORMAL HIGH (ref 0.44–1.00)
GFR calc Af Amer: 41 mL/min — ABNORMAL LOW (ref >60)
GFR calc non Af Amer: 35 mL/min — ABNORMAL LOW (ref >60)
Glucose, Bld: 188 mg/dL — ABNORMAL HIGH (ref 70–99)
Potassium: 4.4 mmol/L (ref 3.5–5.1)
Sodium: 135 mmol/L (ref 135–145)

## 2019-12-07 LAB — URINALYSIS, ROUTINE W REFLEX MICROSCOPIC
Bilirubin Urine: NEGATIVE
Glucose, UA: NEGATIVE mg/dL
Ketones, ur: 5 mg/dL — AB
Nitrite: NEGATIVE
Protein, ur: 100 mg/dL — AB
Specific Gravity, Urine: 1.018 (ref 1.005–1.030)
WBC, UA: >50 WBC/hpf — ABNORMAL HIGH (ref 0–5)
pH: 6 (ref 5.0–8.0)

## 2019-12-07 LAB — COMPREHENSIVE METABOLIC PANEL
ALT: 7 U/L (ref 0–44)
AST: 21 U/L (ref 15–41)
Albumin: 3.2 g/dL — ABNORMAL LOW (ref 3.5–5.0)
Alkaline Phosphatase: 210 U/L — ABNORMAL HIGH (ref 38–126)
Anion gap: 13 (ref 5–15)
BUN: 24 mg/dL — ABNORMAL HIGH (ref 8–23)
CO2: 12 mmol/L — ABNORMAL LOW (ref 22–32)
Calcium: 8.7 mg/dL — ABNORMAL LOW (ref 8.9–10.3)
Chloride: 111 mmol/L (ref 98–111)
Creatinine, Ser: 1.62 mg/dL — ABNORMAL HIGH (ref 0.44–1.00)
GFR calc Af Amer: 37 mL/min — ABNORMAL LOW (ref >60)
GFR calc non Af Amer: 32 mL/min — ABNORMAL LOW (ref >60)
Glucose, Bld: 114 mg/dL — ABNORMAL HIGH (ref 70–99)
Potassium: 6.4 mmol/L (ref 3.5–5.1)
Sodium: 136 mmol/L (ref 135–145)
Total Bilirubin: 1.2 mg/dL (ref 0.3–1.2)
Total Protein: 7.5 g/dL (ref 6.5–8.1)

## 2019-12-07 LAB — LIPASE, BLOOD: Lipase: 1682 U/L — ABNORMAL HIGH (ref 11–51)

## 2019-12-07 LAB — LACTIC ACID, PLASMA: Lactic Acid, Venous: 1.1 mmol/L (ref 0.5–1.9)

## 2019-12-07 MED ORDER — SODIUM CHLORIDE 0.9 % IV SOLN: 1.0000 g | Freq: Once | INTRAVENOUS | Status: DC

## 2019-12-07 MED ORDER — FENTANYL CITRATE (PF) 100 MCG/2ML IJ SOLN
25.0000 ug | Freq: Once | INTRAMUSCULAR | Status: AC
  Administered 2019-12-07: 21:00:00 25 ug via INTRAVENOUS
  Filled 2019-12-07: qty 2

## 2019-12-07 MED ORDER — ONDANSETRON HCL 4 MG/2ML IJ SOLN
4.0000 mg | Freq: Once | INTRAMUSCULAR | Status: AC
  Administered 2019-12-07: 4 mg via INTRAVENOUS
  Filled 2019-12-07: qty 2

## 2019-12-07 MED ORDER — SODIUM CHLORIDE 0.9 % IV SOLN
1.0000 g | Freq: Once | INTRAVENOUS | Status: DC
  Filled 2019-12-07: qty 10

## 2019-12-07 MED ORDER — DEXTROSE 50 % IV SOLN
1.0000 | Freq: Once | INTRAVENOUS | Status: AC
  Administered 2019-12-07: 50 mL via INTRAVENOUS
  Filled 2019-12-07: qty 50

## 2019-12-07 MED ORDER — LACTATED RINGERS IV SOLN: INTRAVENOUS | Status: DC

## 2019-12-07 MED ORDER — ACETAMINOPHEN 325 MG PO TABS
650.0000 mg | ORAL_TABLET | Freq: Four times a day (QID) | ORAL | Status: DC | PRN
  Administered 2019-12-09 – 2019-12-10 (×2): 650 mg via ORAL
  Filled 2019-12-07 (×2): qty 2

## 2019-12-07 MED ORDER — ALBUTEROL SULFATE HFA 108 (90 BASE) MCG/ACT IN AERS
2.0000 | INHALATION_SPRAY | Freq: Once | RESPIRATORY_TRACT | Status: AC
  Administered 2019-12-07: 2 via RESPIRATORY_TRACT
  Filled 2019-12-07: qty 6.7

## 2019-12-07 MED ORDER — SODIUM CHLORIDE 0.9 % IV SOLN: INTRAVENOUS | Status: AC

## 2019-12-07 MED ORDER — ONDANSETRON HCL 4 MG/2ML IJ SOLN
4.0000 mg | Freq: Once | INTRAMUSCULAR | Status: DC
  Filled 2019-12-07: qty 2

## 2019-12-07 MED ORDER — HYDROMORPHONE HCL 1 MG/ML IJ SOLN
1.0000 mg | INTRAMUSCULAR | Status: DC | PRN
  Administered 2019-12-08 – 2019-12-13 (×17): 1 mg via INTRAVENOUS
  Filled 2019-12-07 (×17): qty 1

## 2019-12-07 MED ORDER — FENTANYL CITRATE (PF) 100 MCG/2ML IJ SOLN
25.0000 ug | Freq: Once | INTRAMUSCULAR | Status: AC
  Administered 2019-12-07: 22:00:00 25 ug via INTRAVENOUS
  Filled 2019-12-07: qty 2

## 2019-12-07 MED ORDER — HYDRALAZINE HCL 20 MG/ML IJ SOLN
5.0000 mg | INTRAMUSCULAR | Status: DC | PRN
  Administered 2019-12-08: 5 mg via INTRAVENOUS
  Filled 2019-12-07: qty 1

## 2019-12-07 MED ORDER — SODIUM CHLORIDE (PF) 0.9 % IJ SOLN
INTRAMUSCULAR | Status: AC
  Filled 2019-12-07: qty 50

## 2019-12-07 MED ORDER — SODIUM CHLORIDE 0.9 % IV BOLUS
1000.0000 mL | Freq: Once | INTRAVENOUS | Status: AC
  Administered 2019-12-08: 1000 mL via INTRAVENOUS

## 2019-12-07 MED ORDER — INSULIN ASPART 100 UNIT/ML IV SOLN
5.0000 [IU] | Freq: Once | INTRAVENOUS | Status: AC
  Administered 2019-12-07: 5 [IU] via INTRAVENOUS
  Filled 2019-12-07: qty 0.05

## 2019-12-07 MED ORDER — ACETAMINOPHEN 650 MG RE SUPP: 650.0000 mg | Freq: Four times a day (QID) | RECTAL | Status: DC | PRN

## 2019-12-07 MED ORDER — IOHEXOL 300 MG/ML  SOLN
75.0000 mL | Freq: Once | INTRAMUSCULAR | Status: AC | PRN
  Administered 2019-12-07: 75 mL via INTRAVENOUS

## 2019-12-07 MED ORDER — STERILE WATER FOR INJECTION IV SOLN
INTRAVENOUS | Status: DC
  Filled 2019-12-07 (×5): qty 850

## 2019-12-07 MED ORDER — SODIUM CHLORIDE 0.9 % IV SOLN
1.0000 g | Freq: Once | INTRAVENOUS | Status: DC
  Administered 2019-12-07: 23:00:00 1 g via INTRAVENOUS
  Filled 2019-12-07: qty 10

## 2019-12-07 MED ORDER — CALCIUM GLUCONATE-NACL 1-0.675 GM/50ML-% IV SOLN
1.0000 g | Freq: Once | INTRAVENOUS | Status: AC
  Administered 2019-12-07: 1000 mg via INTRAVENOUS
  Filled 2019-12-07: qty 50

## 2019-12-07 NOTE — ED Notes (Signed)
Son, called and left contact information, Breunna Leino [phone].

## 2019-12-07 NOTE — ED Notes (Signed)
US in room 

## 2019-12-07 NOTE — ED Triage Notes (Signed)
Pt BIBA from home.   Per EMS-  Pt c/o lower back and abd pain x "a couple days." Pt arrives with foley in place.

## 2019-12-07 NOTE — H&P (Signed)
History and Physical    HOLLAND KOTTER JFH:[id number] DOB: 1951-11-03 DOA: 12/07/2019  PCP: Jeanie Sewer, NP Patient coming from: Home  Chief Complaint: Abdominal pain, back pain  HPI: [name] is a 68 y.o. 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 coli and VRE, history of seizures presenting to the ED for evaluation of abdominal pain and back pain.  Patient reports having severe epigastric/periumbilical abdominal pain which radiates to her back.  She is not sure when this started, thinks maybe 4 to 5 days ago.  She has also [name] vomiting.  She has [name] having chills at home.  Denies history of alcohol use.  Denies history of gallstones.  Reports dysuria and urinary frequency.  No additional history could be obtained from the patient.  ED Course: Afebrile.  Blood pressure significantly elevated with systolic 937 on arrival.  WBC count 15.3 with left shift.  Potassium 6.4.  Bicarb 12.  BUN 24, creatinine 1.6.  Alk phos 210, remainder of LFTs normal.  Lipase 1682.  UA with large amount of leukocytes, 21-50 RBCs, greater than 50 WBCs, and many bacteria.  Urine culture pending.  SARS-CoV-2 PCR test pending.  Chest x-ray showing hyperinflated lungs and no acute findings.  CT abdomen pelvis with contrast showing indistinct appearance of the pancreatic head and uncinate process with associated fluid and fairly extensive inflammatory changes consistent with acute pancreatitis.  No definite organized fluid collection identified.  Punctate focus of gas in the region of inflammatory change at the pancreatic head, uncertain if this is a small focus of gas in the distal common bile duct versus a tiny gas collection related to early necrosis. Patient received  treatment for hyperkalemia including albuterol, insulin with D50, and calcium gluconate.  Started on ceftriaxone for UTI.  Review of Systems:  All systems reviewed and apart from history of presenting illness, are negative.  Past Medical History:  Diagnosis Date  . Acute renal failure superimposed on stage 3 chronic kidney disease ([place]) 03/26/2019  . Alzheimer's dementia ([place])    takes Aricept nightly  . Alzheimer's dementia ([place]) 03/26/2019  . Anemia   . Anemia, unspecified 03/27/2019  . COPD (chronic obstructive pulmonary disease) ([address])   . COPD with acute exacerbation ([place]) 03/26/2019  . Delirium 03/27/2019  . History of blood transfusion    received 2units on Feb 17,2015  . History of MRSA infection    several yrs ago  . HLD (hyperlipidemia) 06/06/2018  . HTN (hypertension) 06/06/2018  . Hyperlipidemia    takes Atorvastatin daily  . Hypertension    takes Amlodipine and Carvedilol daily  . Insomnia    takes Ambien nightly  . Memory loss   . Renal disorder    kidney stones  . Seizures ([place])   . Stroke ([place])   . Subacute delirium 10/14/2013   takes Seroquel and Dilantin nightly  . Toxic encephalopathy   . UTI (lower urinary tract infection)    takes Macrodantin daily    Past Surgical History:  Procedure Laterality Date  . ABDOMINAL ANGIOGRAM N/A 01/20/2014   Procedure: ABDOMINAL ANGIOGRAM;  Surgeon: Elam Dutch, MD;  Location: [facility] CATH LAB;  Service: Cardiovascular;  Laterality: N/A;  . ABDOMINAL HYSTERECTOMY    . AMPUTATION Left 02/08/2014   Procedure: AMPUTATION Left 4th & 5th toes;  Surgeon:  Conrad Moorhead, MD;  Location: [facility];  Service: Vascular;  Laterality: Left;  . BLADDER SURGERY    . CHOLECYSTECTOMY  1995   Gall Bladder  . ERCP N/A 10/24/2018   Procedure: ENDOSCOPIC RETROGRADE CHOLANGIOPANCREATOGRAPHY (ERCP);  Surgeon: Clarene Essex, MD;  Location: [facility];  Service: Endoscopy;  Laterality: N/A;  . INSERTION OF ILIAC STENT Left 01/26/2014   Procedure: INSERTION  OF ILIAC STENT- LEFT COMMON ILIAC ARTERY STENT ;  Surgeon: Conrad Hope, MD;  Location: [facility];  Service: Vascular;  Laterality: Left;  . KNEE SURGERY    . PANCREATIC STENT PLACEMENT  10/24/2018   Procedure: PANCREATIC STENT PLACEMENT;  Surgeon: Clarene Essex, MD;  Location: [facility];  Service: Endoscopy;;  . REMOVAL OF STONES  10/24/2018   Procedure: REMOVAL OF STONES;  Surgeon: Clarene Essex, MD;  Location: [facility];  Service: Endoscopy;;  balloon sweep  . SPHINCTEROTOMY  10/24/2018   Procedure: SPHINCTEROTOMY;  Surgeon: Clarene Essex, MD;  Location: [facility];  Service: Endoscopy;;     reports that she has [name] smoking cigarettes. She has [name] smoking about 1.00 pack per day. She has never used smokeless tobacco. She reports that she does not drink alcohol or use drugs.  Allergies  Allergen Reactions  . Codeine Nausea And Vomiting    REACTION: intolerance  . Ace Inhibitors   . Latex Other (See Comments)    itching    Family History  Problem Relation Age of Onset  . Cancer Brother     Prior to Admission medications   Medication Sig Start Date End Date Taking? Authorizing Provider  acetaminophen (TYLENOL) 500 MG tablet Take 500 mg by mouth every 6 (six) hours as needed for moderate pain.   Yes [provider]  albuterol (PROVENTIL) (2.5 MG/3ML) 0.083% nebulizer solution Take 2.5 mg by nebulization every 6 (six) hours as needed for wheezing or shortness of breath.   Yes [provider]  atorvastatin (LIPITOR) 20 MG tablet Take 20 mg by mouth daily. 11/07/18  Yes [provider]  carvedilol (COREG) 6.25 MG tablet Take 6.25 mg by mouth 2 (two) times daily. 01/12/19  Yes [provider]  diphenoxylate-atropine (LOMOTIL) 2.5-0.025 MG tablet Take 2 tablets by mouth 3 (three) times daily as needed for diarrhea or loose stools.  11/23/19  Yes [provider]  Docusate Sodium (COLACE PO) Take 1 tablet by mouth at bedtime.   Yes [provider]  donepezil (ARICEPT) 10 MG tablet Take 1 tablet (10 mg total) by mouth at bedtime. 04/13/19  Yes Dohmeier, Asencion Partridge, MD  hydrALAZINE (APRESOLINE) 50 MG tablet Take 50 mg by mouth 3 (three) times daily.   Yes [provider]  hydrOXYzine (ATARAX/VISTARIL) 25 MG tablet Take 12.5 mg by mouth 2 (two) times daily as needed for anxiety.    Yes [provider]  ibuprofen (ADVIL) 600 MG tablet Take 600 mg by mouth every 6 (six) hours as needed for moderate pain.  10/14/19  Yes [provider]  isosorbide mononitrate (IMDUR) 30 MG 24 hr tablet Take 30 mg by mouth daily.   Yes [provider]  levETIRAcetam (KEPPRA) 500 MG tablet Take 500 mg by mouth 2 (two) times daily.   Yes [provider]  Melatonin 3-2 MG TABS Take 3 mg by mouth at bedtime. 04/13/19  Yes Dohmeier, Asencion Partridge, MD  ondansetron (ZOFRAN-ODT) 4 MG disintegrating tablet Take 4 mg by mouth every 8 (eight) hours as needed for nausea or vomiting.  Yes [provider]  phenazopyridine (PYRIDIUM) 95 MG tablet Take 95 mg by mouth 3 (three) times daily as needed for pain.   Yes [provider]  phenytoin (DILANTIN) 100 MG ER capsule Take 3 capsules (300 mg total) by mouth at bedtime. 04/13/19  Yes Dohmeier, Asencion Partridge, MD  Sennosides 15 MG TABS Take 1 tablet by mouth daily as needed (constipation).   Yes [provider]  sodium bicarbonate 650 MG tablet Take 1 tablet (650 mg total) by mouth 3 (three) times daily. 10/31/18  Yes Kayleen Memos, DO  traMADol (ULTRAM) 50 MG tablet Take 50 mg by mouth 2 (two) times daily as needed for moderate pain or severe pain.  11/13/19  Yes [provider]  ferrous sulfate 325 (65 FE) MG tablet Take 1 tablet (325 mg total) by mouth 2 (two) times daily with a meal. Patient not taking: Reported on 12/07/2019 10/31/18   Kayleen Memos, DO  furosemide (LASIX) 40 MG tablet Take 1 tablet (40 mg total) by mouth 2 (two) times daily. Patient not  taking: Reported on 12/07/2019 04/28/19   Revankar, Reita Cliche, MD  hydrALAZINE (APRESOLINE) 50 MG tablet Take 1 tablet (50 mg total) by mouth 3 (three) times daily for 30 days. 04/28/19 05/28/19  Revankar, Reita Cliche, MD  isosorbide mononitrate (IMDUR) 30 MG 24 hr tablet Take 1 tablet (30 mg total) by mouth daily for 30 days. 04/28/19 05/28/19  Revankar, Reita Cliche, MD  OXYGEN Inhale 2 L into the lungs.    [provider]  spironolactone (ALDACTONE) 25 MG tablet Take 1 tablet (25 mg total) by mouth daily for 30 days. 04/28/19 05/28/19  RevankarReita Cliche, MD    Physical Exam: Vitals:   12/07/19 2213 12/07/19 2216 12/07/19 2300 12/07/19 2345  BP:  (!) 182/82 (!) 169/105 (!) 136/113  Pulse:  88 76 72  Resp:  20 18 19   Temp:  (!) 97.5 F (36.4 C)    TempSrc:  Oral    SpO2: 100% 100% 100% 100%  Weight:      Height:        Physical Exam  Constitutional: She appears well-developed and well-nourished. She appears distressed.  Distress secondary to pain  HENT:  Very dry mucous membranes  Eyes: Right eye exhibits no discharge. Left eye exhibits no discharge.  Cardiovascular: Normal rate, regular rhythm and intact distal pulses.  Pulmonary/Chest: Effort normal and breath sounds normal. No respiratory distress. She has no wheezes. She has no rales.  Abdominal: Soft. Bowel sounds are normal. She exhibits no distension. There is abdominal tenderness. There is guarding. There is no rebound.  Generalized tenderness to palpation with guarding Tenderness worse in the epigastric region  Musculoskeletal:        General: No edema.     Cervical back: Neck supple.  Neurological:  Awake and alert  Skin: Skin is warm and dry. She is not diaphoretic.     Labs on Admission: I have personally reviewed following labs and imaging studies  CBC: Recent Labs  Lab 12/07/19 2030  WBC 15.3*  NEUTROABS 12.7*  HGB 12.1  HCT 39.2  MCV 97.0  PLT 507   Basic Metabolic Panel: Recent Labs  Lab 12/07/19 1913  12/07/19 2309 12/07/19 2320  NA 136 135  --   K 6.4* 4.4  --   CL 111 109  --   CO2 12* 15*  --   GLUCOSE 114* 188*  --   BUN 24* 26*  --  CREATININE 1.62* 1.51*  --   CALCIUM 8.7* 8.4*  --   MG  --   --  2.0   GFR: Estimated Creatinine Clearance: 20.9 mL/min (A) (by C-G formula based on SCr of 1.51 mg/dL (H)). Liver Function Tests: Recent Labs  Lab 12/07/19 1913  AST 21  ALT 7  ALKPHOS 210*  BILITOT 1.2  PROT 7.5  ALBUMIN 3.2*   Recent Labs  Lab 12/07/19 1913  LIPASE 1,682*   No results for input(s): AMMONIA in the last 168 hours. Coagulation Profile: No results for input(s): INR, PROTIME in the last 168 hours. Cardiac Enzymes: No results for input(s): CKTOTAL, CKMB, CKMBINDEX, TROPONINI in the last 168 hours. BNP (last 3 results) No results for input(s): PROBNP in the last 8760 hours. HbA1C: No results for input(s): HGBA1C in the last 72 hours. CBG: No results for input(s): GLUCAP in the last 168 hours. Lipid Profile: No results for input(s): CHOL, HDL, LDLCALC, TRIG, CHOLHDL, LDLDIRECT in the last 72 hours. Thyroid Function Tests: No results for input(s): TSH, T4TOTAL, FREET4, T3FREE, THYROIDAB in the last 72 hours. Anemia Panel: No results for input(s): VITAMINB12, FOLATE, FERRITIN, TIBC, IRON, RETICCTPCT in the last 72 hours. Urine analysis:    Component Value Date/Time   COLORURINE YELLOW 12/07/2019 1914   APPEARANCEUR TURBID (A) 12/07/2019 1914   LABSPEC 1.018 12/07/2019 1914   PHURINE 6.0 12/07/2019 1914   GLUCOSEU NEGATIVE 12/07/2019 1914   HGBUR MODERATE (A) 12/07/2019 1914   BILIRUBINUR NEGATIVE 12/07/2019 1914   KETONESUR 5 (A) 12/07/2019 1914   PROTEINUR 100 (A) 12/07/2019 1914   UROBILINOGEN 0.2 01/24/2014 0500   NITRITE NEGATIVE 12/07/2019 1914   LEUKOCYTESUR LARGE (A) 12/07/2019 1914    Radiological Exams on Admission: CT ABDOMEN PELVIS W CONTRAST  Result Date: 12/07/2019 CLINICAL DATA:  Abdominal pain, vomiting EXAM: CT ABDOMEN  AND PELVIS WITH CONTRAST TECHNIQUE: Multidetector CT imaging of the abdomen and pelvis was performed using the standard protocol following bolus administration of intravenous contrast. CONTRAST:  64m OMNIPAQUE IOHEXOL 300 MG/ML  SOLN COMPARISON:  CT 10/27/2018, 10/20/2019, 10/13/2019, 05/08/2019 FINDINGS: Lower chest: Lung bases demonstrate no acute consolidation or pleural effusion. The heart size is within normal limits. Hepatobiliary: No focal hepatic abnormality. Small amount of pneumobilia presumably related to prior sphincterotomy. Status post cholecystectomy. Mildly enhancing extrahepatic common bile duct. Pancreas: Indistinct appearance of the head and uncinate process with fluid and inflammatory changes consistent with acute pancreatitis. No organized fluid collections at this time. Punctate focus of gas in the region of inflammatory change at the pancreatic head. No ductal dilatation Spleen: Splenic granuloma Adrenals/Urinary Tract: Adrenal glands are normal. Moderate gas within the right renal collecting system. Similar low-density lesion mid to upper pole right kidney. Left ureteral stent remains in place, the distal pigtail terminates within the right anterior pelvis, presumably within decompressed and thinned reconstructed bladder. Indwelling Foley catheter is noted. There is decreased left hydronephrosis compared to prior. Numerous subcentimeter hypodense left renal lesions. Mild urothelial enhancement on the right. Stomach/Bowel: The stomach is nonenlarged. Duodenum is indistinct and poorly defined, likely due to inflammation from adjacent pancreatitis. No dilated small bowel. No colon wall thickening. Bowel anastomosis in the right hemiabdomen as before. Vascular/Lymphatic: Moderate severe aortic atherosclerosis without aneurysm. Left iliac stent. No increasing adenopathy. Reproductive: Status post hysterectomy. No adnexal mass. Multiple clips in the pelvis. Other: No free air. Small ventral wall  defect to the right of the umbilical region without significant hernia. Musculoskeletal: No acute or suspicious osseous abnormality.  IMPRESSION: 1. Indistinct appearance of the pancreatic head and uncinate process with associated fluid and fairly extensive inflammatory changes consistent with an acute pancreatitis. No definite organized fluid collections at this time. Punctate focus of gas in the region of inflammatory change at the pancreatic head, uncertain if this is a small focus of gas in the distal common bile duct versus a tiny gas collection related to early necrosis. Attention on follow-up imaging is recommended. 2. Indistinct appearance of the duodenum presumably due to reactive inflammatory changes from the pancreas. 3. Slight increased gas within the right renal collecting system. Similar positioning of left ureteral stent, with interval decrease in degree of left hydronephrosis. 4. Small amount of pneumobilia, felt related to prior sphincterotomy. This is noted on prior exams. Electronically Signed   By: Donavan Foil M.D.   On: 12/07/2019 22:15   DG Chest Portable 1 View  Result Date: 12/07/2019 CLINICAL DATA:  Pt c/o lower back and abd pain x "a couple days." Pt has a foley catheter. SOB, concern for PNA by ordering MD. H/o COPD, Stroke, HTN. Smoker. shortness of breath, crackles, concern for pna EXAM: PORTABLE CHEST 1 VIEW COMPARISON:  10/19/2019 FINDINGS: Normal mediastinum and cardiac silhouette. Normal pulmonary vasculature. No evidence of effusion, infiltrate, or pneumothorax. No acute bony abnormality. Lungs are hyperinflated. Remote RIGHT rib fractures. IMPRESSION: Hyperinflated lungs.  No acute findings. Electronically Signed   By: Suzy Bouchard M.D.   On: 12/07/2019 19:50   US Abdomen Limited RUQ  Result Date: 12/08/2019 CLINICAL DATA:  Pancreatitis EXAM: ULTRASOUND ABDOMEN LIMITED RIGHT UPPER QUADRANT COMPARISON:  CT 12/07/2019, ultrasound 10/15/2019 FINDINGS: Gallbladder:  Status post cholecystectomy. Common bile duct: Diameter: 8.8 mm Liver: No focal lesion identified. Within normal limits in parenchymal echogenicity. Portal vein is patent on color Doppler imaging with normal direction of blood flow towards the liver. Other: Small amount of perihepatic fluid. IMPRESSION: 1. Status post cholecystectomy. Common bile duct measures up to 8.8 mm, likely within normal limits for post cholecystectomy. 2. Trace perihepatic fluid, felt secondary to pancreatitis Electronically Signed   By: Donavan Foil M.D.   On: 12/08/2019 00:12    EKG: Independently reviewed.  Sinus arrhythmia, baseline wander in V4, QTC 515.  Assessment/Plan Principal Problem:   Acute pancreatitis Active Problems:   AKI (acute kidney injury) ([place])   Catheter-associated urinary tract infection (HCC)   Hyperkalemia   QT prolongation   Severe acute pancreatitis with concern for possible pancreatic necrosis Patient is presenting with complaints of epigastric/periumbilical abdominal pain radiating to her back.  Afebrile.  WBC count 15.3 with left shift.  Lipase significantly elevated at 1682. Alk phos 210, remainder of LFTs normal. CT abdomen pelvis with contrast showing indistinct appearance of the pancreatic head and uncinate process with associated fluid and fairly extensive inflammatory changes consistent with acute pancreatitis.  No definite organized fluid collection identified.  Punctate focus of gas in the region of inflammatory change at the pancreatic head, uncertain if this is a small focus of gas in the distal common bile duct versus a tiny gas collection related to early necrosis. -Discussed the case with Dr. Alessandra Bevels from [facility], recommending aggressive IV fluid hydration and a Carbapenem for antibiotic coverage.  Meropenem ordered. -Keep n.p.o. -Aggressive IV fluid hydration (last echo done June 2019 with normal systolic function and R4BU, patient appears dehydrated at this time and has no  signs of volume overload) -IV Dilaudid as needed for pain -IV Compazine as needed for nausea/vomiting -Repeat lipase and LFTs  in a.m. -Continue to monitor WBC count -Right upper quadrant ultrasound -Check lactic acid level  CAUTI Patient has an indwelling urinary catheter in place. 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  CT abdomen pelvis done today showing left ureteral stent in place and decreased left hydronephrosis compared to prior study.  UA with large amount of leukocytes, 21-50 RBCs, greater than 50 WBCs, and many bacteria.  Patient son states she is seen by Dr. Nila Nephew in [place] and last office visit was about 2.5 weeks ago. -Meropenem as above -Urine culture pending -Will leave catheter in place for now.  Consult urology in a.m.  AKI on CKD stage III Creatinine 1.6, was 1.2 in April 2020.  Possibly prerenal as patient appears dehydrated on exam. -IV fluid hydration -Continue to monitor renal function -Avoid nephrotoxic agents/contrast  Hyperkalemia, QT prolongation on EKG Question if related to AKI, does take spironolactone at home.  Potassium 6.4.  EKG with QT prolongation (QTC 515).  Patient son states she has [name] taking Zofran for the past few days for nausea/vomiting, this could also possibly be contributing to QT prolongation.  Patient received treatment for hyperkalemia in the ED including albuterol, insulin with D50, and calcium gluconate.  -Repeat stat labs to check potassium -Hold spironolactone -Cardiac monitoring -Monitor potassium and magnesium levels -Repeat EKG in a.m. -Avoid QT prolonging drugs if possible  Metabolic acidosis Possibly related to AKI versus ?lactic acidosis.  Bicarb 12. -Check lactic acid level -Bicarb infusion -Continue to monitor bicarb level  Uncontrolled  hypertension Systolic 446 on arrival, currently in the 160s to 180s. -IV hydralazine as needed for SBP greater than 180  DVT prophylaxis: SCDs at this time. Code Status: Discussed with the patient's son who confirmed that patient is DNR. Family Communication: Son updated over the phone. Disposition Plan: Anticipate discharge after clinical improvement. Consults called: [facility] Admission status: It is my clinical opinion that admission to INPATIENT is reasonable and necessary in this 68 y.o. female . presenting with severe acute pancreatitis with concern for possible pancreatic necrosis.  Very high risk of decompensation.  Given the aforementioned, the predictability of an adverse outcome is felt to be significant. I expect that the patient will require at least 2 midnights in the hospital to treat this condition.   The medical decision making on this patient was of high complexity and the patient is at high risk for clinical deterioration, therefore this is a level 3 visit.  Shela Leff MD [facility] Pager [phone]  If 7PM-7AM, please contact night-coverage [email or web address] Password [facility]  12/08/2019, 12:24 AM

## 2019-12-07 NOTE — ED Provider Notes (Signed)
[facility] DEPT Provider Note   CSN: [id number] Arrival date & time: 12/07/19  [id number]     History Chief Complaint  Patient presents with  . Abdominal Pain  . Back Pain    [name] is a 68 y.o. female.  Presents to ER with severe abdominal pain, nausea and vomiting.  Patient reports that she has been having pain for the past week, worsening over the past couple days though.  Initially was some dull achy abdominal pain, now sharp, shooting, radiating to back.  Has had multiple episodes of vomiting, nonbloody nonbilious.  No diarrhea or constipation.  Denies alcohol abuse, no new medications, no known gallbladder problems.  HPI     Past Medical History:  Diagnosis Date  . Acute renal failure superimposed on stage 3 chronic kidney disease ([place]) 03/26/2019  . Alzheimer's dementia ([place])    takes Aricept nightly  . Alzheimer's dementia ([place]) 03/26/2019  . Anemia   . Anemia, unspecified 03/27/2019  . COPD (chronic obstructive pulmonary disease) ([place])   . COPD with acute exacerbation ([place]) 03/26/2019  . Delirium 03/27/2019  . History of blood transfusion    received 2units on Feb 17,2015  . History of MRSA infection    several yrs ago  . HLD (hyperlipidemia) 06/06/2018  . HTN (hypertension) 06/06/2018  . Hyperlipidemia    takes Atorvastatin daily  . Hypertension    takes Amlodipine and Carvedilol daily  . Insomnia    takes Ambien nightly  . Memory loss   . Renal disorder    kidney stones  . Seizures ([place])   . Stroke ([place])   . Subacute delirium 10/14/2013   takes Seroquel and Dilantin nightly  . Toxic encephalopathy   . UTI (lower urinary tract infection)    takes Macrodantin daily    Patient Active Problem List   Diagnosis Date Noted  . DOE (dyspnea on exertion) 04/28/2019  . Febrile respiratory illness 04/13/2019  . Seizures ([place]) 04/13/2019  . Accidental drug overdose 04/13/2019  . Dementia arising in the senium and presenium ([place])  04/13/2019  . NSAID overdose 03/27/2019  . Anemia, unspecified 03/27/2019  . Delirium 03/27/2019  . Palliative care by specialist   . Advanced care planning/counseling discussion   . Goals of care, counseling/discussion   . Acute renal failure superimposed on stage 3 chronic kidney disease ([place]) 03/26/2019  . Alzheimer's dementia ([place]) 03/26/2019  . COPD with acute exacerbation ([place]) 03/26/2019  . Acute on chronic respiratory failure with hypoxia ([place]) 03/24/2019  . Acute cholecystitis due to biliary calculus 10/23/2018  . Biliary colic [id number]  . PAD (peripheral artery disease) ([place]) 06/06/2018  . Generalized idiopathic epilepsy and epileptic syndromes, not intractable, with status epilepticus ([place]) 06/06/2018  . Chest pain 06/06/2018  . Diarrhea 06/06/2018  . HTN (hypertension) 06/06/2018  . HLD (hyperlipidemia) 06/06/2018  . Ventilator dependent ([place])   . Lactic acidosis   . Acute renal failure ([place])   . Recurrent sepsis due to urinary tract infection ([place])   . Iliac artery stenosis, left ([place]) 05/14/2014  . Swollen feet 02/01/2014  . Pain in limb-Left Great toe, 4th and  5th digit 02/01/2014  . Thromboembolism of lower extremity artery ([place]) 02/01/2014  . UTI (urinary tract infection) due to Enterococcus 01/20/2014  . Acute blood loss anemia 01/20/2014  . Protein-calorie malnutrition, severe ([place]) 01/18/2014  . Critical lower limb ischemia 01/17/2014  . Subacute delirium 10/14/2013  . Renal atrophy, left 06/23/2013  . Long term current use  of opiate analgesic 12/11/2012  . Low back pain 12/11/2012  . Hip pain, bilateral 12/11/2012  . Tobacco use disorder 12/11/2012  . Flank pain, chronic 12/09/2012  . DYSPHAGIA UNSPECIFIED 10/28/2008  . HYPOVOLEMIA 10/25/2008  . 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 [place] DISEASE 10/25/2008  . COPD (chronic obstructive pulmonary disease) ([place]) 10/25/2008  . ESOPHAGEAL STRICTURE 10/25/2008  . GERD 10/25/2008  . HIATAL HERNIA  10/25/2008  . SMALL BOWEL OBSTRUCTION 10/25/2008  . IBS 10/25/2008  . OTHER RETROPERITONEAL ABSCESS 10/25/2008  . Blind loop syndrome 10/25/2008  . OTHER AND UNSPECIFIED POSTSURGICAL NONABSORPTION 10/25/2008  . PYELONEPHRITIS 10/25/2008  . RENAL CALCULUS 10/25/2008  . INTERSTITIAL CYSTITIS 10/25/2008  . ENDOMETRIOSIS 10/25/2008  . PSORIASIS 10/25/2008  . HEADACHE, CHRONIC 10/25/2008  . COLONIC POLYPS, HX OF 10/25/2008    Past Surgical History:  Procedure Laterality Date  . ABDOMINAL ANGIOGRAM N/A 01/20/2014   Procedure: ABDOMINAL ANGIOGRAM;  Surgeon: Elam Dutch, MD;  Location: [facility] CATH LAB;  Service: Cardiovascular;  Laterality: N/A;  . ABDOMINAL HYSTERECTOMY    . AMPUTATION Left 02/08/2014   Procedure: AMPUTATION Left 4th & 5th toes;  Surgeon: Conrad Grace, MD;  Location: [facility];  Service: Vascular;  Laterality: Left;  . BLADDER SURGERY    . CHOLECYSTECTOMY  1995   Gall Bladder  . ERCP N/A 10/24/2018   Procedure: ENDOSCOPIC RETROGRADE CHOLANGIOPANCREATOGRAPHY (ERCP);  Surgeon: Clarene Essex, MD;  Location: [facility];  Service: Endoscopy;  Laterality: N/A;  . INSERTION OF ILIAC STENT Left 01/26/2014   Procedure: INSERTION OF ILIAC STENT- LEFT COMMON ILIAC ARTERY STENT ;  Surgeon: Conrad [place], MD;  Location: [facility];  Service: Vascular;  Laterality: Left;  . KNEE SURGERY    . PANCREATIC STENT PLACEMENT  10/24/2018   Procedure: PANCREATIC STENT PLACEMENT;  Surgeon: Clarene Essex, MD;  Location: [facility];  Service: Endoscopy;;  . REMOVAL OF STONES  10/24/2018   Procedure: REMOVAL OF STONES;  Surgeon: Clarene Essex, MD;  Location: [facility];  Service: Endoscopy;;  balloon sweep  . SPHINCTEROTOMY  10/24/2018   Procedure: SPHINCTEROTOMY;  Surgeon: Clarene Essex, MD;  Location[facility] ENDOSCOPY;  Service: Endoscopy;;     OB History   No obstetric history on file.     Family History  Problem Relation Age of Onset  . Cancer Brother     Social History   Tobacco Use  . Smoking  status: Current Every Day Smoker    Packs/day: 1.00    Types: Cigarettes  . Smokeless tobacco: Never Used  Substance Use Topics  . Alcohol use: No  . Drug use: No    Home Medications Prior to Admission medications   Medication Sig Start Date End Date Taking? Authorizing Provider  acetaminophen (TYLENOL) 500 MG tablet Take 500 mg by mouth every 6 (six) hours as needed for moderate pain.   Yes [provider]  albuterol (PROVENTIL) (2.5 MG/3ML) 0.083% nebulizer solution Take 2.5 mg by nebulization every 6 (six) hours as needed for wheezing or shortness of breath.   Yes [provider]  atorvastatin (LIPITOR) 20 MG tablet Take 20 mg by mouth daily. 11/07/18  Yes [provider]  carvedilol (COREG) 6.25 MG tablet Take 6.25 mg by mouth 2 (two) times daily. 01/12/19  Yes [provider]  diphenoxylate-atropine (LOMOTIL) 2.5-0.025 MG tablet Take 2 tablets by mouth 3 (three) times daily as needed for diarrhea or loose stools.  11/23/19  Yes [provider]  Docusate Sodium (COLACE PO) Take 1 tablet  by mouth at bedtime.   Yes [provider]  donepezil (ARICEPT) 10 MG tablet Take 1 tablet (10 mg total) by mouth at bedtime. 04/13/19  Yes Dohmeier, Asencion Partridge, MD  hydrALAZINE (APRESOLINE) 50 MG tablet Take 50 mg by mouth 3 (three) times daily.   Yes [provider]  hydrOXYzine (ATARAX/VISTARIL) 25 MG tablet Take 12.5 mg by mouth 2 (two) times daily as needed for anxiety.    Yes [provider]  ibuprofen (ADVIL) 600 MG tablet Take 600 mg by mouth every 6 (six) hours as needed for moderate pain.  10/14/19  Yes [provider]  isosorbide mononitrate (IMDUR) 30 MG 24 hr tablet Take 30 mg by mouth daily.   Yes [provider]  levETIRAcetam (KEPPRA) 500 MG tablet Take 500 mg by mouth 2 (two) times daily.   Yes [provider]  Melatonin 3-2 MG TABS Take 3 mg by mouth at bedtime. 04/13/19  Yes Dohmeier, Asencion Partridge, MD   ondansetron (ZOFRAN-ODT) 4 MG disintegrating tablet Take 4 mg by mouth every 8 (eight) hours as needed for nausea or vomiting.   Yes [provider]  phenazopyridine (PYRIDIUM) 95 MG tablet Take 95 mg by mouth 3 (three) times daily as needed for pain.   Yes [provider]  phenytoin (DILANTIN) 100 MG ER capsule Take 3 capsules (300 mg total) by mouth at bedtime. 04/13/19  Yes Dohmeier, Asencion Partridge, MD  Sennosides 15 MG TABS Take 1 tablet by mouth daily as needed (constipation).   Yes [provider]  sodium bicarbonate 650 MG tablet Take 1 tablet (650 mg total) by mouth 3 (three) times daily. 10/31/18  Yes Kayleen Memos, DO  traMADol (ULTRAM) 50 MG tablet Take 50 mg by mouth 2 (two) times daily as needed for moderate pain or severe pain.  11/13/19  Yes [provider]  ferrous sulfate 325 (65 FE) MG tablet Take 1 tablet (325 mg total) by mouth 2 (two) times daily with a meal. Patient not taking: Reported on 12/07/2019 10/31/18   Kayleen Memos, DO  furosemide (LASIX) 40 MG tablet Take 1 tablet (40 mg total) by mouth 2 (two) times daily. Patient not taking: Reported on 12/07/2019 04/28/19   Revankar, Reita Cliche, MD  hydrALAZINE (APRESOLINE) 50 MG tablet Take 1 tablet (50 mg total) by mouth 3 (three) times daily for 30 days. 04/28/19 05/28/19  Revankar, Reita Cliche, MD  isosorbide mononitrate (IMDUR) 30 MG 24 hr tablet Take 1 tablet (30 mg total) by mouth daily for 30 days. 04/28/19 05/28/19  Revankar, Reita Cliche, MD  OXYGEN Inhale 2 L into the lungs.    [provider]  spironolactone (ALDACTONE) 25 MG tablet Take 1 tablet (25 mg total) by mouth daily for 30 days. 04/28/19 05/28/19  Revankar, Reita Cliche, MD    Allergies    Codeine, Ace inhibitors, and Latex  Review of Systems   Review of Systems  Constitutional: Negative for chills and fever.  HENT: Negative for ear pain and sore throat.   Eyes: Negative for pain and visual disturbance.  Respiratory: Negative for cough and  shortness of breath.   Cardiovascular: Negative for chest pain and palpitations.  Gastrointestinal: Positive for abdominal pain. Negative for vomiting.  Genitourinary: Negative for dysuria and hematuria.  Musculoskeletal: Positive for back pain. Negative for arthralgias.  Skin: Negative for color change and rash.  Neurological: Negative for seizures and syncope.  All other systems reviewed and are negative.   Physical Exam Updated Vital Signs  BP (!) 182/82 (BP Location: Right Arm)   Pulse 88   Temp (!) 97.5 F (36.4 C) (Oral)   Resp 20   Ht 5' (1.524 m)   Wt 37.2 kg   SpO2 100%   BMI 16.01 kg/m   Physical Exam Vitals and nursing note reviewed.  Constitutional:      General: She is not in acute distress.    Appearance: She is well-developed.  HENT:     Head: Normocephalic and atraumatic.  Eyes:     Conjunctiva/sclera: Conjunctivae normal.  Cardiovascular:     Rate and Rhythm: Normal rate and regular rhythm.     Heart sounds: No murmur.  Pulmonary:     Effort: Pulmonary effort is normal. No respiratory distress.     Breath sounds: Normal breath sounds.  Abdominal:     Palpations: Abdomen is soft.     Tenderness: There is abdominal tenderness in the epigastric area. There is no guarding or rebound.     Hernia: No hernia is present.  Musculoskeletal:     Cervical back: Neck supple.  Skin:    General: Skin is warm and dry.     Capillary Refill: Capillary refill takes less than 2 seconds.  Neurological:     General: No focal deficit present.     Mental Status: She is alert and oriented to person, place, and time.     ED Results / Procedures / Treatments   Labs (all labs ordered are listed, but only abnormal results are displayed) Labs Reviewed  COMPREHENSIVE METABOLIC PANEL - Abnormal; Notable for the following components:      Result Value   Potassium 6.4 (*)    CO2 12 (*)    Glucose, Bld 114 (*)    BUN 24 (*)    Creatinine, Ser 1.62 (*)    Calcium 8.7 (*)     Albumin 3.2 (*)    Alkaline Phosphatase 210 (*)    GFR calc non Af Amer 32 (*)    GFR calc Af Amer 37 (*)    All other components within normal limits  LIPASE, BLOOD - Abnormal; Notable for the following components:   Lipase 1,682 (*)    All other components within normal limits  URINALYSIS, ROUTINE W REFLEX MICROSCOPIC - Abnormal; Notable for the following components:   APPearance TURBID (*)    Hgb urine dipstick MODERATE (*)    Ketones, ur 5 (*)    Protein, ur 100 (*)    Leukocytes,Ua LARGE (*)    WBC, UA >50 (*)    Bacteria, UA MANY (*)    Non Squamous Epithelial 0-5 (*)    All other components within normal limits  CBC WITH DIFFERENTIAL/PLATELET - Abnormal; Notable for the following components:   WBC 15.3 (*)    RDW 16.0 (*)    Neutro Abs 12.7 (*)    Abs Immature Granulocytes 0.12 (*)    All other components within normal limits  URINE CULTURE  SARS CORONAVIRUS 2 (TAT 6-24 HRS)  CBC WITH DIFFERENTIAL/PLATELET    EKG EKG Interpretation  Date/Time:  Monday December 07 2019 21:27:19 EST Ventricular Rate:  64 PR Interval:    QRS Duration: 97 QT Interval:  499 QTC Calculation: 515 R Axis:   10 Text Interpretation: Sinus arrhythmia Prolonged QT interval Baseline wander in lead(s) V4 Confirmed by Madalyn Rob [phone]) on 12/07/2019 9:34:09 PM   Radiology CT ABDOMEN PELVIS W CONTRAST  Result Date: 12/07/2019 CLINICAL DATA:  Abdominal pain, vomiting EXAM:  CT ABDOMEN AND PELVIS WITH CONTRAST TECHNIQUE: Multidetector CT imaging of the abdomen and pelvis was performed using the standard protocol following bolus administration of intravenous contrast. CONTRAST:  25mL OMNIPAQUE IOHEXOL 300 MG/ML  SOLN COMPARISON:  CT 10/27/2018, 10/20/2019, 10/13/2019, 05/08/2019 FINDINGS: Lower chest: Lung bases demonstrate no acute consolidation or pleural effusion. The heart size is within normal limits. Hepatobiliary: No focal hepatic abnormality. Small amount of pneumobilia presumably  related to prior sphincterotomy. Status post cholecystectomy. Mildly enhancing extrahepatic common bile duct. Pancreas: Indistinct appearance of the head and uncinate process with fluid and inflammatory changes consistent with acute pancreatitis. No organized fluid collections at this time. Punctate focus of gas in the region of inflammatory change at the pancreatic head. No ductal dilatation Spleen: Splenic granuloma Adrenals/Urinary Tract: Adrenal glands are normal. Moderate gas within the right renal collecting system. Similar low-density lesion mid to upper pole right kidney. Left ureteral stent remains in place, the distal pigtail terminates within the right anterior pelvis, presumably within decompressed and thinned reconstructed bladder. Indwelling Foley catheter is noted. There is decreased left hydronephrosis compared to prior. Numerous subcentimeter hypodense left renal lesions. Mild urothelial enhancement on the right. Stomach/Bowel: The stomach is nonenlarged. Duodenum is indistinct and poorly defined, likely due to inflammation from adjacent pancreatitis. No dilated small bowel. No colon wall thickening. Bowel anastomosis in the right hemiabdomen as before. Vascular/Lymphatic: Moderate severe aortic atherosclerosis without aneurysm. Left iliac stent. No increasing adenopathy. Reproductive: Status post hysterectomy. No adnexal mass. Multiple clips in the pelvis. Other: No free air. Small ventral wall defect to the right of the umbilical region without significant hernia. Musculoskeletal: No acute or suspicious osseous abnormality. IMPRESSION: 1. Indistinct appearance of the pancreatic head and uncinate process with associated fluid and fairly extensive inflammatory changes consistent with an acute pancreatitis. No definite organized fluid collections at this time. Punctate focus of gas in the region of inflammatory change at the pancreatic head, uncertain if this is a small focus of gas in the distal  common bile duct versus a tiny gas collection related to early necrosis. Attention on follow-up imaging is recommended. 2. Indistinct appearance of the duodenum presumably due to reactive inflammatory changes from the pancreas. 3. Slight increased gas within the right renal collecting system. Similar positioning of left ureteral stent, with interval decrease in degree of left hydronephrosis. 4. Small amount of pneumobilia, felt related to prior sphincterotomy. This is noted on prior exams. Electronically Signed   By: Donavan Foil M.D.   On: 12/07/2019 22:15   DG Chest Portable 1 View  Result Date: 12/07/2019 CLINICAL DATA:  Pt c/o lower back and abd pain x "a couple days." Pt has a foley catheter. SOB, concern for PNA by ordering MD. H/o COPD, Stroke, HTN. Smoker. shortness of breath, crackles, concern for pna EXAM: PORTABLE CHEST 1 VIEW COMPARISON:  10/19/2019 FINDINGS: Normal mediastinum and cardiac silhouette. Normal pulmonary vasculature. No evidence of effusion, infiltrate, or pneumothorax. No acute bony abnormality. Lungs are hyperinflated. Remote RIGHT rib fractures. IMPRESSION: Hyperinflated lungs.  No acute findings. Electronically Signed   By: Suzy Bouchard M.D.   On: 12/07/2019 19:50    Procedures .Critical Care Performed by: Lucrezia Starch, MD Authorized by: Lucrezia Starch, MD   Critical care provider statement:    Critical care time (minutes):  45   Critical care was necessary to treat or prevent imminent or life-threatening deterioration of the following conditions:  Metabolic crisis   Critical care was time spent personally by me  on the following activities:  Discussions with consultants, evaluation of patient's response to treatment, examination of patient, ordering and performing treatments and interventions, ordering and review of laboratory studies, ordering and review of radiographic studies, pulse oximetry, re-evaluation of patient's condition, obtaining history from  patient or surrogate and review of old charts   (including critical care time)  Medications Ordered in ED Medications  sodium chloride (PF) 0.9 % injection (has no administration in time range)  cefTRIAXone (ROCEPHIN) 1 g in sodium chloride 0.9 % 100 mL IVPB (has no administration in time range)  calcium gluconate 1 g/ 50 mL sodium chloride IVPB (has no administration in time range)  ondansetron (ZOFRAN) injection 4 mg (4 mg Intravenous Given 12/07/19 2050)  fentaNYL (SUBLIMAZE) injection 25 mcg (25 mcg Intravenous Given 12/07/19 2051)  iohexol (OMNIPAQUE) 300 MG/ML solution 75 mL (75 mLs Intravenous Contrast Given 12/07/19 2131)  insulin aspart (novoLOG) injection 5 Units (5 Units Intravenous Given 12/07/19 2211)    And  dextrose 50 % solution 50 mL (50 mLs Intravenous Given 12/07/19 2209)  albuterol (VENTOLIN HFA) 108 (90 Base) MCG/ACT inhaler 2 puff (2 puffs Inhalation Given 12/07/19 2213)  fentaNYL (SUBLIMAZE) injection 25 mcg (25 mcg Intravenous Given 12/07/19 2208)    ED Course  I have reviewed the triage vital signs and the nursing notes.  Pertinent labs & imaging results that were available during my care of the patient were reviewed by me and considered in my medical decision making (see chart for details).  Clinical Course as of Dec 06 2232  Mon Dec 07, 2019  2134 Ekg with  mild qt prolongation, no T peaked t waves or other abnormalities  Potassium(!!): 6.4 [RD]  2134 Lipase, blood(!) [RD]  2138 Reviewed CT, obvious pancreatitis, will recheck and admit patient, also noted UTI, will start Rocephin   [RD]  2139 Urinalysis, Routine w reflex microscopic(!) [RD]  2139 CBC with Differential/Platelet(!) [RD]  2139 Will give treatment for hyper K   [RD]    Clinical Course User Index [RD] Lucrezia Starch, MD   MDM Rules/Calculators/A&P                     68 year old lady presents to ER with severe abdominal pain.  Work-up was concerning for pancreatitis.  Urinalysis  concerning for UTI.  Chemistry with hyperkalemia.  No EKG changes.  Provided aggressive pain control, started fluids, gave calcium, insulin and albuterol, Rocephin for UTI.  Will admit to hospitalist for further management of her acute pancreatitis.  Dr. Marlowe Sax accepting.  Final Clinical Impression(s) / ED Diagnoses Final diagnoses:  Acute pancreatitis, unspecified complication status, unspecified pancreatitis type  Urinary tract infection without hematuria, site unspecified  Leukocytosis, unspecified type  Acute pancreatitis    Rx / DC Orders ED Discharge Orders    None       Lucrezia Starch, MD 12/08/19 0009

## 2019-12-08 ENCOUNTER — Inpatient Hospital Stay (HOSPITAL_COMMUNITY): Payer: Medicare Other

## 2019-12-08 ENCOUNTER — Encounter (HOSPITAL_COMMUNITY): Payer: Self-pay | Admitting: Internal Medicine

## 2019-12-08 DIAGNOSIS — N179 Acute kidney failure, unspecified: Secondary | ICD-10-CM

## 2019-12-08 DIAGNOSIS — K851 Biliary acute pancreatitis without necrosis or infection: Secondary | ICD-10-CM

## 2019-12-08 DIAGNOSIS — N390 Urinary tract infection, site not specified: Secondary | ICD-10-CM

## 2019-12-08 DIAGNOSIS — K838 Other specified diseases of biliary tract: Secondary | ICD-10-CM

## 2019-12-08 DIAGNOSIS — R9431 Abnormal electrocardiogram [ECG] [EKG]: Secondary | ICD-10-CM

## 2019-12-08 DIAGNOSIS — E875 Hyperkalemia: Secondary | ICD-10-CM

## 2019-12-08 DIAGNOSIS — T83511D Infection and inflammatory reaction due to indwelling urethral catheter, subsequent encounter: Secondary | ICD-10-CM

## 2019-12-08 LAB — CBC WITH DIFFERENTIAL/PLATELET
Abs Immature Granulocytes: 0.14 10*3/uL — ABNORMAL HIGH (ref 0.00–0.07)
Basophils Absolute: 0 10*3/uL (ref 0.0–0.1)
Basophils Relative: 0 %
Eosinophils Absolute: 0 10*3/uL (ref 0.0–0.5)
Eosinophils Relative: 0 %
HCT: 40 % (ref 36.0–46.0)
Hemoglobin: 12.3 g/dL (ref 12.0–15.0)
Immature Granulocytes: 1 %
Lymphocytes Relative: 10 %
Lymphs Abs: 1.5 10*3/uL (ref 0.7–4.0)
MCH: 29.5 pg (ref 26.0–34.0)
MCHC: 30.8 g/dL (ref 30.0–36.0)
MCV: 95.9 fL (ref 80.0–100.0)
Monocytes Absolute: 1 10*3/uL (ref 0.1–1.0)
Monocytes Relative: 7 %
Neutro Abs: 12.2 10*3/uL — ABNORMAL HIGH (ref 1.7–7.7)
Neutrophils Relative %: 82 %
Platelets: 172 10*3/uL (ref 150–400)
RBC: 4.17 MIL/uL (ref 3.87–5.11)
RDW: 16.1 % — ABNORMAL HIGH (ref 11.5–15.5)
WBC: 14.8 10*3/uL — ABNORMAL HIGH (ref 4.0–10.5)
nRBC: 0 % (ref 0.0–0.2)

## 2019-12-08 LAB — BASIC METABOLIC PANEL
Anion gap: 10 (ref 5–15)
BUN: 21 mg/dL (ref 8–23)
CO2: 17 mmol/L — ABNORMAL LOW (ref 22–32)
Calcium: 8.2 mg/dL — ABNORMAL LOW (ref 8.9–10.3)
Chloride: 114 mmol/L — ABNORMAL HIGH (ref 98–111)
Creatinine, Ser: 1.36 mg/dL — ABNORMAL HIGH (ref 0.44–1.00)
GFR calc Af Amer: 46 mL/min — ABNORMAL LOW (ref >60)
GFR calc non Af Amer: 40 mL/min — ABNORMAL LOW (ref >60)
Glucose, Bld: 108 mg/dL — ABNORMAL HIGH (ref 70–99)
Potassium: 4 mmol/L (ref 3.5–5.1)
Sodium: 141 mmol/L (ref 135–145)

## 2019-12-08 LAB — HEPATIC FUNCTION PANEL
ALT: 6 U/L (ref 0–44)
AST: 13 U/L — ABNORMAL LOW (ref 15–41)
Albumin: 2.8 g/dL — ABNORMAL LOW (ref 3.5–5.0)
Alkaline Phosphatase: 193 U/L — ABNORMAL HIGH (ref 38–126)
Bilirubin, Direct: 0.1 mg/dL (ref 0.0–0.2)
Indirect Bilirubin: 0.5 mg/dL (ref 0.3–0.9)
Total Bilirubin: 0.6 mg/dL (ref 0.3–1.2)
Total Protein: 6.5 g/dL (ref 6.5–8.1)

## 2019-12-08 LAB — MAGNESIUM: Magnesium: 2 mg/dL (ref 1.7–2.4)

## 2019-12-08 LAB — LIPASE, BLOOD: Lipase: 927 U/L — ABNORMAL HIGH (ref 11–51)

## 2019-12-08 LAB — URINE CULTURE

## 2019-12-08 LAB — SARS CORONAVIRUS 2 (TAT 6-24 HRS): SARS Coronavirus 2: NEGATIVE

## 2019-12-08 MED ORDER — LEVETIRACETAM 500 MG PO TABS
500.0000 mg | ORAL_TABLET | Freq: Two times a day (BID) | ORAL | Status: DC
  Administered 2019-12-08 – 2019-12-14 (×12): 500 mg via ORAL
  Filled 2019-12-08 (×12): qty 1

## 2019-12-08 MED ORDER — HYDROXYZINE HCL 25 MG PO TABS
12.5000 mg | ORAL_TABLET | Freq: Two times a day (BID) | ORAL | Status: DC | PRN
  Administered 2019-12-10 – 2019-12-14 (×7): 12.5 mg via ORAL
  Filled 2019-12-08 (×8): qty 1

## 2019-12-08 MED ORDER — MELATONIN 3 MG PO TABS
3.0000 mg | ORAL_TABLET | Freq: Every day | ORAL | Status: DC
  Administered 2019-12-08 – 2019-12-13 (×6): 3 mg via ORAL
  Filled 2019-12-08 (×6): qty 1

## 2019-12-08 MED ORDER — DONEPEZIL HCL 10 MG PO TABS
10.0000 mg | ORAL_TABLET | Freq: Every day | ORAL | Status: DC
  Administered 2019-12-08 – 2019-12-13 (×6): 10 mg via ORAL
  Filled 2019-12-08: qty 1
  Filled 2019-12-08: qty 2
  Filled 2019-12-08 (×3): qty 1

## 2019-12-08 MED ORDER — PROCHLORPERAZINE EDISYLATE 10 MG/2ML IJ SOLN
10.0000 mg | Freq: Four times a day (QID) | INTRAMUSCULAR | Status: DC | PRN
  Administered 2019-12-08 – 2019-12-10 (×4): 10 mg via INTRAVENOUS
  Filled 2019-12-08 (×4): qty 2

## 2019-12-08 MED ORDER — ONDANSETRON 4 MG PO TBDP
4.0000 mg | ORAL_TABLET | Freq: Three times a day (TID) | ORAL | Status: DC | PRN
  Administered 2019-12-14: 4 mg via ORAL
  Filled 2019-12-08: qty 1

## 2019-12-08 MED ORDER — LORAZEPAM 2 MG/ML IJ SOLN
1.0000 mg | INTRAMUSCULAR | Status: DC | PRN
  Administered 2019-12-11: 1 mg via INTRAVENOUS
  Filled 2019-12-08: qty 1

## 2019-12-08 MED ORDER — HYDRALAZINE HCL 10 MG PO TABS: 50.0000 mg | ORAL_TABLET | Freq: Three times a day (TID) | ORAL | Status: DC

## 2019-12-08 MED ORDER — SCOPOLAMINE 1 MG/3DAYS TD PT72
1.0000 | MEDICATED_PATCH | TRANSDERMAL | Status: DC
  Administered 2019-12-08 – 2019-12-14 (×3): 1.5 mg via TRANSDERMAL
  Filled 2019-12-08 (×3): qty 1

## 2019-12-08 MED ORDER — SODIUM BICARBONATE 650 MG PO TABS
650.0000 mg | ORAL_TABLET | Freq: Three times a day (TID) | ORAL | Status: DC
  Administered 2019-12-08 – 2019-12-14 (×17): 650 mg via ORAL
  Filled 2019-12-08 (×19): qty 1

## 2019-12-08 MED ORDER — HYDRALAZINE HCL 20 MG/ML IJ SOLN
10.0000 mg | INTRAMUSCULAR | Status: DC | PRN
  Administered 2019-12-08 (×2): 10 mg via INTRAVENOUS
  Filled 2019-12-08 (×3): qty 1

## 2019-12-08 MED ORDER — DOCUSATE SODIUM 100 MG PO CAPS
100.0000 mg | ORAL_CAPSULE | Freq: Every day | ORAL | Status: DC
  Administered 2019-12-09 – 2019-12-14 (×6): 100 mg via ORAL
  Filled 2019-12-08 (×6): qty 1

## 2019-12-08 MED ORDER — GADOBUTROL 1 MMOL/ML IV SOLN: 4.0000 mL | Freq: Once | INTRAVENOUS | Status: DC | PRN

## 2019-12-08 MED ORDER — ORAL CARE MOUTH RINSE
15.0000 mL | Freq: Two times a day (BID) | OROMUCOSAL | Status: DC
  Administered 2019-12-09 – 2019-12-13 (×8): 15 mL via OROMUCOSAL

## 2019-12-08 MED ORDER — CARVEDILOL 6.25 MG PO TABS
6.2500 mg | ORAL_TABLET | Freq: Two times a day (BID) | ORAL | Status: DC
  Administered 2019-12-08 – 2019-12-14 (×12): 6.25 mg via ORAL
  Filled 2019-12-08 (×13): qty 1

## 2019-12-08 MED ORDER — TRAMADOL HCL 50 MG PO TABS
50.0000 mg | ORAL_TABLET | Freq: Two times a day (BID) | ORAL | Status: DC | PRN
  Administered 2019-12-08 – 2019-12-14 (×8): 50 mg via ORAL
  Filled 2019-12-08 (×8): qty 1

## 2019-12-08 MED ORDER — ISOSORBIDE MONONITRATE ER 30 MG PO TB24
30.0000 mg | ORAL_TABLET | Freq: Every day | ORAL | Status: DC
  Administered 2019-12-09 – 2019-12-14 (×6): 30 mg via ORAL
  Filled 2019-12-08 (×7): qty 1

## 2019-12-08 MED ORDER — HYDRALAZINE HCL 50 MG PO TABS
50.0000 mg | ORAL_TABLET | Freq: Three times a day (TID) | ORAL | Status: DC
  Administered 2019-12-09 – 2019-12-14 (×16): 50 mg via ORAL
  Filled 2019-12-08 (×16): qty 1

## 2019-12-08 MED ORDER — ALBUTEROL SULFATE (2.5 MG/3ML) 0.083% IN NEBU: 2.5000 mg | INHALATION_SOLUTION | Freq: Four times a day (QID) | RESPIRATORY_TRACT | Status: DC | PRN

## 2019-12-08 MED ORDER — CHLORHEXIDINE GLUCONATE CLOTH 2 % EX PADS
6.0000 | MEDICATED_PAD | Freq: Every day | CUTANEOUS | Status: DC
  Administered 2019-12-09 – 2019-12-14 (×5): 6 via TOPICAL

## 2019-12-08 MED ORDER — SODIUM CHLORIDE 0.9 % IV SOLN
1.0000 g | Freq: Two times a day (BID) | INTRAVENOUS | Status: DC
  Administered 2019-12-08 – 2019-12-09 (×5): 1 g via INTRAVENOUS
  Filled 2019-12-08 (×7): qty 1

## 2019-12-08 MED ORDER — PHENYTOIN SODIUM EXTENDED 100 MG PO CAPS
300.0000 mg | ORAL_CAPSULE | Freq: Every day | ORAL | Status: DC
  Administered 2019-12-08 – 2019-12-13 (×6): 300 mg via ORAL
  Filled 2019-12-08 (×7): qty 3

## 2019-12-08 MED ORDER — SODIUM CHLORIDE 0.9 % IV BOLUS
500.0000 mL | Freq: Once | INTRAVENOUS | Status: AC
  Administered 2019-12-08: 500 mL via INTRAVENOUS

## 2019-12-08 MED ORDER — ACETAMINOPHEN 500 MG PO TABS: 500.0000 mg | ORAL_TABLET | Freq: Four times a day (QID) | ORAL | Status: DC | PRN

## 2019-12-08 NOTE — ED Notes (Signed)
Dr. Benny Lennert paged at this time. Patient is now back from CT.

## 2019-12-08 NOTE — ED Notes (Signed)
Son, called and left contact information, [place] Braden [phone].

## 2019-12-08 NOTE — ED Notes (Signed)
Patient taken off of 2L [place]. O2 on room air is 96%.

## 2019-12-08 NOTE — ED Notes (Signed)
RN spoke to MRI on the phone at this time. MRI will try to come in the next hour for patient.

## 2019-12-08 NOTE — ED Notes (Signed)
When patient sleeps O2 sat falls to 90%. RN put patient on 3L [place] - patient now 96% O2

## 2019-12-08 NOTE — ED Notes (Signed)
FLUSHED IV and started antibiotic

## 2019-12-08 NOTE — ED Notes (Signed)
Provider notified bp elevation  Provider notified nausea and vomiting

## 2019-12-08 NOTE — Progress Notes (Signed)
Pharmacy Antibiotic Note  YASUKO POZO is a 68 y.o. female admitted on 12/07/2019 with Intra-abdominal infection   Pharmacy has been consulted for Meropenem dosing.  Plan: Meropenem 1gm iv q12hr  Height: 5' (152.4 cm) Weight: 82 lb (37.2 kg) IBW/kg (Calculated) : 45.5  Temp (24hrs), Avg:97.5 F (36.4 C), Min:97.4 F (36.3 C), Max:97.5 F (36.4 C)  Recent Labs  Lab 12/07/19 1913 12/07/19 2030 12/07/19 2257 12/07/19 2309 12/08/19 0500  WBC  --  15.3*  --   --  14.8*  CREATININE 1.62*  --   --  1.51*  --   LATICACIDVEN  --   --  1.1  --   --     Estimated Creatinine Clearance: 20.9 mL/min (A) (by C-G formula based on SCr of 1.51 mg/dL (H)).    Allergies  Allergen Reactions  . Codeine Nausea And Vomiting    REACTION: intolerance  . Ace Inhibitors   . Latex Other (See Comments)    itching    Antimicrobials this admission: Meropenem 12/08/2019 >>  Dose adjustments this admission: -  Microbiology results: -  Thank you for allowing pharmacy to be a part of this patient's care.  Nani Skillern Crowford 12/08/2019 6:00 AM

## 2019-12-08 NOTE — Consult Note (Addendum)
Referring Provider:  Dr. Marlowe Sax, [facility] Primary Care Physician:  Jeanie Sewer, NP Primary Gastroenterologist:  Althia Forts   Reason for Consultation:   Pancreatitis  HPI: [name] is a 68 y.o. 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 coli and VRE, history of seizures, and history of CBD stones s/p ERCP in 10/2018 with mild post-ERCP pancreatitis.  [facility] for evaluation of abdominal pain and back pain.  Patient reports having severe epigastric/periumbilical abdominal pain which radiates to her back.  Says that she's had some pain for a while, but has worsened and "really hurts" at this point.  Having vomiting.  Lipase was 1682 upon initial evaluation, but now down to 927 this AM.  Elevated WBC count at 15K.  LFT'S normal except for ALP of 210 down to 193 this AM.  Dirty urine.  CT scan of the abdomen and pelvis with contrast showed the following:  IMPRESSION: 1. Indistinct appearance of the pancreatic head and uncinate process with associated fluid and fairly extensive inflammatory changes consistent with an acute pancreatitis. No definite organized fluid collections at this time. Punctate focus of gas in the region of inflammatory change at the pancreatic head, uncertain if this is a small focus of gas in the distal common bile duct versus a tiny gas collection related to early necrosis. Attention on follow-up imaging is recommended. 2. Indistinct appearance of the duodenum presumably due to reactive inflammatory changes from the pancreas. 3. Slight increased gas within the right renal collecting system. Similar positioning of left ureteral stent, with interval decrease in degree of left hydronephrosis. 4. Small amount of  pneumobilia, felt related to prior sphincterotomy. This is noted on prior exams.  Ultrasound showed the following:  IMPRESSION: 1. Status post cholecystectomy. Common bile duct measures up to 8.8 mm, likely within normal limits for post cholecystectomy. 2. Trace perihepatic fluid, felt secondary to pancreatitis  ERCP 10/2018 with Dr. Watt Climes:  - The major papilla appeared normal. - Choledocholithiasis was found. Complete removal was accomplished by biliary sphincterotomy and balloon extraction. - One plastic stent was placed into the ventral pancreatic duct to decrease the chance of pancreatitis and assist with cannulation. - A biliary sphincterotomy was performed. - The biliary tree was swept.  Looks like patient followed up in [place] with a general surgeon, Dr. Orrin Brigham, after her ERCP and was told stent was no longer in place so did not need procedure to have it removed.   Past Medical History:  Diagnosis Date  . Acute renal failure superimposed on stage 3 chronic kidney disease ([place]) 03/26/2019  . Alzheimer's dementia ([place])    takes Aricept nightly  . Alzheimer's dementia ([place]) 03/26/2019  . Anemia   . Anemia, unspecified 03/27/2019  . COPD (chronic obstructive pulmonary disease) ([place])   . COPD with acute exacerbation ([place]) 03/26/2019  . Delirium 03/27/2019  . History of blood transfusion    received 2units on Feb 17,2015  . History of MRSA infection    several yrs ago  . HLD (hyperlipidemia) 06/06/2018  . HTN (hypertension) 06/06/2018  . Hyperlipidemia    takes Atorvastatin daily  . Hypertension    takes Amlodipine and Carvedilol daily  . Insomnia    takes Ambien nightly  . Memory loss   . Renal disorder    kidney  stones  . Seizures ([place])   . Stroke ([place])   . Subacute delirium 10/14/2013   takes Seroquel and Dilantin nightly  . Toxic encephalopathy   . UTI (lower urinary tract infection)    takes Macrodantin daily    Past Surgical History:  Procedure  Laterality Date  . ABDOMINAL ANGIOGRAM N/A 01/20/2014   Procedure: ABDOMINAL ANGIOGRAM;  Surgeon: Elam Dutch, MD;  Location: [facility] CATH LAB;  Service: Cardiovascular;  Laterality: N/A;  . ABDOMINAL HYSTERECTOMY    . AMPUTATION Left 02/08/2014   Procedure: AMPUTATION Left 4th & 5th toes;  Surgeon: Conrad Slovan, MD;  Location: [place];  Service: Vascular;  Laterality: Left;  . BLADDER SURGERY    . CHOLECYSTECTOMY  1995   Gall Bladder  . ERCP N/A 10/24/2018   Procedure: ENDOSCOPIC RETROGRADE CHOLANGIOPANCREATOGRAPHY (ERCP);  Surgeon: Clarene Essex, MD;  Location: [place];  Service: Endoscopy;  Laterality: N/A;  . INSERTION OF ILIAC STENT Left 01/26/2014   Procedure: INSERTION OF ILIAC STENT- LEFT COMMON ILIAC ARTERY STENT ;  Surgeon: Conrad New Waverly, MD;  Location: [place];  Service: Vascular;  Laterality: Left;  . KNEE SURGERY    . PANCREATIC STENT PLACEMENT  10/24/2018   Procedure: PANCREATIC STENT PLACEMENT;  Surgeon: Clarene Essex, MD;  Location: [place];  Service: Endoscopy;;  . REMOVAL OF STONES  10/24/2018   Procedure: REMOVAL OF STONES;  Surgeon: Clarene Essex, MD;  Location: [place];  Service: Endoscopy;;  balloon sweep  . SPHINCTEROTOMY  10/24/2018   Procedure: SPHINCTEROTOMY;  Surgeon: Clarene Essex, MD;  Location: [place];  Service: Endoscopy;;    Prior to Admission medications   Medication Sig Start Date End Date Taking? Authorizing Provider  acetaminophen (TYLENOL) 500 MG tablet Take 500 mg by mouth every 6 (six) hours as needed for moderate pain.   Yes [provider]  albuterol (PROVENTIL) (2.5 MG/3ML) 0.083% nebulizer solution Take 2.5 mg by nebulization every 6 (six) hours as needed for wheezing or shortness of breath.   Yes [provider]  atorvastatin (LIPITOR) 20 MG tablet Take 20 mg by mouth daily. 11/07/18  Yes [provider]  carvedilol (COREG) 6.25 MG tablet Take 6.25 mg by mouth 2 (two) times daily. 01/12/19  Yes [provider]  diphenoxylate-atropine (LOMOTIL) 2.5-0.025 MG tablet Take 2 tablets by mouth 3 (three) times daily as needed for diarrhea or loose stools.  11/23/19  Yes [provider]  Docusate Sodium (COLACE PO) Take 1 tablet by mouth at bedtime.   Yes [provider]  donepezil (ARICEPT) 10 MG tablet Take 1 tablet (10 mg total) by mouth at bedtime. 04/13/19  Yes Dohmeier, Asencion Partridge, MD  hydrALAZINE (APRESOLINE) 50 MG tablet Take 50 mg by mouth 3 (three) times daily.   Yes [provider]  hydrOXYzine (ATARAX/VISTARIL) 25 MG tablet Take 12.5 mg by mouth 2 (two) times daily as needed for anxiety.    Yes [provider]  ibuprofen (ADVIL) 600 MG tablet Take 600 mg by mouth every 6 (six) hours as needed for moderate pain.  10/14/19  Yes [provider]  isosorbide mononitrate (IMDUR) 30 MG 24 hr tablet Take 30 mg by mouth daily.   Yes [provider]  levETIRAcetam (KEPPRA) 500 MG tablet Take 500 mg by mouth 2 (two) times daily.   Yes [provider]  Melatonin 3-2 MG TABS Take 3 mg by mouth at bedtime. 04/13/19  Yes Dohmeier, Asencion Partridge, MD  ondansetron (ZOFRAN-ODT) 4 MG disintegrating tablet  Take 4 mg by mouth every 8 (eight) hours as needed for nausea or vomiting.   Yes [provider]  phenazopyridine (PYRIDIUM) 95 MG tablet Take 95 mg by mouth 3 (three) times daily as needed for pain.   Yes [provider]  phenytoin (DILANTIN) 100 MG ER capsule Take 3 capsules (300 mg total) by mouth at bedtime. 04/13/19  Yes Dohmeier, Asencion Partridge, MD  Sennosides 15 MG TABS Take 1 tablet by mouth daily as needed (constipation).   Yes [provider]  sodium bicarbonate 650 MG tablet Take 1 tablet (650 mg total) by mouth 3 (three) times daily. 10/31/18  Yes Kayleen Memos, DO  traMADol (ULTRAM) 50 MG tablet Take 50 mg by mouth 2 (two) times daily as needed for moderate pain or severe pain.  11/13/19  Yes [provider]  ferrous sulfate 325 (65  FE) MG tablet Take 1 tablet (325 mg total) by mouth 2 (two) times daily with a meal. Patient not taking: Reported on 12/07/2019 10/31/18   Kayleen Memos, DO  furosemide (LASIX) 40 MG tablet Take 1 tablet (40 mg total) by mouth 2 (two) times daily. Patient not taking: Reported on 12/07/2019 04/28/19   Revankar, Reita Cliche, MD  hydrALAZINE (APRESOLINE) 50 MG tablet Take 1 tablet (50 mg total) by mouth 3 (three) times daily for 30 days. 04/28/19 05/28/19  Revankar, Reita Cliche, MD  isosorbide mononitrate (IMDUR) 30 MG 24 hr tablet Take 1 tablet (30 mg total) by mouth daily for 30 days. 04/28/19 05/28/19  Revankar, Reita Cliche, MD  OXYGEN Inhale 2 L into the lungs.    [provider]  spironolactone (ALDACTONE) 25 MG tablet Take 1 tablet (25 mg total) by mouth daily for 30 days. 04/28/19 05/28/19  Revankar, Reita Cliche, MD    Current Facility-Administered Medications  Medication Dose Route Frequency Provider Last Rate Last Admin  . 0.9 %  sodium chloride infusion   Intravenous Continuous Shela Leff, MD   Stopped at 12/08/19 0424  . acetaminophen (TYLENOL) tablet 650 mg  650 mg Oral Q6H PRN Shela Leff, MD       Or  . acetaminophen (TYLENOL) suppository 650 mg  650 mg Rectal Q6H PRN Shela Leff, MD      . hydrALAZINE (APRESOLINE) injection 10 mg  10 mg Intravenous Q4H PRN Shela Leff, MD      . HYDROmorphone (DILAUDID) injection 1 mg  1 mg Intravenous Q2H PRN Shela Leff, MD   1 mg at 12/08/19 0555  . LORazepam (ATIVAN) injection 1 mg  1 mg Intravenous Q4H PRN Shela Leff, MD      . meropenem (MERREM) 1 g in sodium chloride 0.9 % 100 mL IVPB  1 g Intravenous Q12H Shela Leff, MD   Stopped at 12/08/19 0424  . prochlorperazine (COMPAZINE) injection 10 mg  10 mg Intravenous Q6H PRN Shela Leff, MD   10 mg at 12/08/19 0044  . scopolamine (TRANSDERM-SCOP) 1 MG/3DAYS 1.5 mg  1 patch Transdermal Q72H Shela Leff, MD   1.5 mg at 12/08/19 0549  . sodium  bicarbonate 150 mEq in sterile water 1,000 mL infusion   Intravenous Continuous Shela Leff, MD 100 mL/hr at 12/08/19 0547 Restarted at 12/08/19 0547  . sodium chloride (PF) 0.9 % injection            Current Outpatient Medications  Medication Sig Dispense Refill  . acetaminophen (TYLENOL) 500 MG tablet Take 500 mg by mouth every 6 (six) hours as needed for moderate  pain.    . albuterol (PROVENTIL) (2.5 MG/3ML) 0.083% nebulizer solution Take 2.5 mg by nebulization every 6 (six) hours as needed for wheezing or shortness of breath.    Marland Kitchen atorvastatin (LIPITOR) 20 MG tablet Take 20 mg by mouth daily.    . carvedilol (COREG) 6.25 MG tablet Take 6.25 mg by mouth 2 (two) times daily.    . diphenoxylate-atropine (LOMOTIL) 2.5-0.025 MG tablet Take 2 tablets by mouth 3 (three) times daily as needed for diarrhea or loose stools.     Mariane Baumgarten Sodium (COLACE PO) Take 1 tablet by mouth at bedtime.    . donepezil (ARICEPT) 10 MG tablet Take 1 tablet (10 mg total) by mouth at bedtime. 90 tablet 3  . hydrALAZINE (APRESOLINE) 50 MG tablet Take 50 mg by mouth 3 (three) times daily.    . hydrOXYzine (ATARAX/VISTARIL) 25 MG tablet Take 12.5 mg by mouth 2 (two) times daily as needed for anxiety.     Marland Kitchen ibuprofen (ADVIL) 600 MG tablet Take 600 mg by mouth every 6 (six) hours as needed for moderate pain.     . isosorbide mononitrate (IMDUR) 30 MG 24 hr tablet Take 30 mg by mouth daily.    Marland Kitchen levETIRAcetam (KEPPRA) 500 MG tablet Take 500 mg by mouth 2 (two) times daily.    . Melatonin 3-2 MG TABS Take 3 mg by mouth at bedtime.  0  . ondansetron (ZOFRAN-ODT) 4 MG disintegrating tablet Take 4 mg by mouth every 8 (eight) hours as needed for nausea or vomiting.    . phenazopyridine (PYRIDIUM) 95 MG tablet Take 95 mg by mouth 3 (three) times daily as needed for pain.    . phenytoin (DILANTIN) 100 MG ER capsule Take 3 capsules (300 mg total) by mouth at bedtime. 270 capsule 3  . Sennosides 15 MG TABS Take 1 tablet by  mouth daily as needed (constipation).    . sodium bicarbonate 650 MG tablet Take 1 tablet (650 mg total) by mouth 3 (three) times daily. 90 tablet 0  . traMADol (ULTRAM) 50 MG tablet Take 50 mg by mouth 2 (two) times daily as needed for moderate pain or severe pain.     . ferrous sulfate 325 (65 FE) MG tablet Take 1 tablet (325 mg total) by mouth 2 (two) times daily with a meal. (Patient not taking: Reported on 12/07/2019) 60 tablet 0  . furosemide (LASIX) 40 MG tablet Take 1 tablet (40 mg total) by mouth 2 (two) times daily. (Patient not taking: Reported on 12/07/2019) 60 tablet 2  . hydrALAZINE (APRESOLINE) 50 MG tablet Take 1 tablet (50 mg total) by mouth 3 (three) times daily for 30 days. 90 tablet 1  . isosorbide mononitrate (IMDUR) 30 MG 24 hr tablet Take 1 tablet (30 mg total) by mouth daily for 30 days. 90 tablet 1  . OXYGEN Inhale 2 L into the lungs.    Marland Kitchen spironolactone (ALDACTONE) 25 MG tablet Take 1 tablet (25 mg total) by mouth daily for 30 days. 30 tablet 1    Allergies as of 12/07/2019 - Review Complete 12/07/2019  Allergen Reaction Noted  . Codeine Nausea And Vomiting   . Ace inhibitors  03/24/2019  . Latex Other (See Comments) 02/05/2014    Family History  Problem Relation Age of Onset  . Cancer Brother     Social History   Socioeconomic History  . Marital status: Married    Spouse name: Not on file  . Number of children: Not on  file  . Years of education: 18  . Highest education level: Not on file  Occupational History  . Not on file  Tobacco Use  . Smoking status: Current Every Day Smoker    Packs/day: 1.00    Types: Cigarettes  . Smokeless tobacco: Never Used  Substance and Sexual Activity  . Alcohol use: No  . Drug use: No  . Sexual activity: Not on file  Other Topics Concern  . Not on file  Social History Narrative   10/13/18 Living with son, Larene Beach at home.  Caregiver during the day.   Social Determinants of Health   Financial Resource Strain:     . Difficulty of Paying Living Expenses: Not on file  Food Insecurity:   . Worried About Charity fundraiser in the Last Year: Not on file  . Ran Out of Food in the Last Year: Not on file  Transportation Needs:   . Lack of Transportation (Medical): Not on file  . Lack of Transportation (Non-Medical): Not on file  Physical Activity:   . Days of Exercise per Week: Not on file  . Minutes of Exercise per Session: Not on file  Stress:   . Feeling of Stress : Not on file  Social Connections:   . Frequency of Communication with Friends and Family: Not on file  . Frequency of Social Gatherings with Friends and Family: Not on file  . Attends Religious Services: Not on file  . Active Member of Clubs or Organizations: Not on file  . Attends Archivist Meetings: Not on file  . Marital Status: Not on file  Intimate Partner Violence:   . Fear of Current or Ex-Partner: Not on file  . Emotionally Abused: Not on file  . Physically Abused: Not on file  . Sexually Abused: Not on file    Review of Systems: ROS is O/W negative except as mentioned in HPI.  Physical Exam: Vital signs in last 24 hours: Temp:  [97.4 F (36.3 C)-97.5 F (36.4 C)] 97.5 F (36.4 C) (12/28 2216) Pulse Rate:  [58-100] 91 (12/29 0830) Resp:  [10-25] 12 (12/29 0830) BP: (125-224)/(65-113) 178/80 (12/29 0830) SpO2:  [97 %-100 %] 98 % (12/29 0830) Weight:  [37.2 kg] 37.2 kg (12/28 2051)   General:  Alert, chronically ill-appearing, thin, frail, in NAD. Head:  Normocephalic and atraumatic. Eyes:  Sclera clear, no icterus.  Conjunctiva pink. Ears:  Normal auditory acuity. Mouth:  No deformity or lesions.   Lungs:  Clear throughout to auscultation.  No wheezes, crackles, or rhonchi.  Heart:  Regular rate and rhythm; no murmurs, clicks, rubs, or gallops. Abdomen:  Soft, non-distended.  BS present.  TTP diffusely.  Msk:  Symmetrical without gross deformities. Pulses:  Normal pulses noted. Extremities:  Without  clubbing or edema. Neurologic:  Alert and oriented x 4;  grossly normal neurologically. Skin:  Intact without significant lesions or rashes. Psych:  Alert and cooperative. Normal mood and affect.  Lab Results: Recent Labs    12/07/19 2030 12/08/19 0500  WBC 15.3* 14.8*  HGB 12.1 12.3  HCT 39.2 40.0  PLT 242 172   BMET Recent Labs    12/07/19 1913 12/07/19 2309 12/08/19 0500  NA 136 135 141  K 6.4* 4.4 4.0  CL 111 109 114*  CO2 12* 15* 17*  GLUCOSE 114* 188* 108*  BUN 24* 26* 21  CREATININE 1.62* 1.51* 1.36*  CALCIUM 8.7* 8.4* 8.2*   LFT Recent Labs    12/08/19 0500  PROT 6.5  ALBUMIN 2.8*  AST 13*  ALT 6  ALKPHOS 193*  BILITOT 0.6  BILIDIR 0.1  IBILI 0.5   Studies/Results: CT ABDOMEN PELVIS W CONTRAST  Result Date: 12/07/2019 CLINICAL DATA:  Abdominal pain, vomiting EXAM: CT ABDOMEN AND PELVIS WITH CONTRAST TECHNIQUE: Multidetector CT imaging of the abdomen and pelvis was performed using the standard protocol following bolus administration of intravenous contrast. CONTRAST:  45mL OMNIPAQUE IOHEXOL 300 MG/ML  SOLN COMPARISON:  CT 10/27/2018, 10/20/2019, 10/13/2019, 05/08/2019 FINDINGS: Lower chest: Lung bases demonstrate no acute consolidation or pleural effusion. The heart size is within normal limits. Hepatobiliary: No focal hepatic abnormality. Small amount of pneumobilia presumably related to prior sphincterotomy. Status post cholecystectomy. Mildly enhancing extrahepatic common bile duct. Pancreas: Indistinct appearance of the head and uncinate process with fluid and inflammatory changes consistent with acute pancreatitis. No organized fluid collections at this time. Punctate focus of gas in the region of inflammatory change at the pancreatic head. No ductal dilatation Spleen: Splenic granuloma Adrenals/Urinary Tract: Adrenal glands are normal. Moderate gas within the right renal collecting system. Similar low-density lesion mid to upper pole right kidney. Left  ureteral stent remains in place, the distal pigtail terminates within the right anterior pelvis, presumably within decompressed and thinned reconstructed bladder. Indwelling Foley catheter is noted. There is decreased left hydronephrosis compared to prior. Numerous subcentimeter hypodense left renal lesions. Mild urothelial enhancement on the right. Stomach/Bowel: The stomach is nonenlarged. Duodenum is indistinct and poorly defined, likely due to inflammation from adjacent pancreatitis. No dilated small bowel. No colon wall thickening. Bowel anastomosis in the right hemiabdomen as before. Vascular/Lymphatic: Moderate severe aortic atherosclerosis without aneurysm. Left iliac stent. No increasing adenopathy. Reproductive: Status post hysterectomy. No adnexal mass. Multiple clips in the pelvis. Other: No free air. Small ventral wall defect to the right of the umbilical region without significant hernia. Musculoskeletal: No acute or suspicious osseous abnormality. IMPRESSION: 1. Indistinct appearance of the pancreatic head and uncinate process with associated fluid and fairly extensive inflammatory changes consistent with an acute pancreatitis. No definite organized fluid collections at this time. Punctate focus of gas in the region of inflammatory change at the pancreatic head, uncertain if this is a small focus of gas in the distal common bile duct versus a tiny gas collection related to early necrosis. Attention on follow-up imaging is recommended. 2. Indistinct appearance of the duodenum presumably due to reactive inflammatory changes from the pancreas. 3. Slight increased gas within the right renal collecting system. Similar positioning of left ureteral stent, with interval decrease in degree of left hydronephrosis. 4. Small amount of pneumobilia, felt related to prior sphincterotomy. This is noted on prior exams. Electronically Signed   By: Donavan Foil M.D.   On: 12/07/2019 22:15   DG Chest Portable 1  View  Result Date: 12/07/2019 CLINICAL DATA:  Pt c/o lower back and abd pain x "a couple days." Pt has a foley catheter. SOB, concern for PNA by ordering MD. H/o COPD, Stroke, HTN. Smoker. shortness of breath, crackles, concern for pna EXAM: PORTABLE CHEST 1 VIEW COMPARISON:  10/19/2019 FINDINGS: Normal mediastinum and cardiac silhouette. Normal pulmonary vasculature. No evidence of effusion, infiltrate, or pneumothorax. No acute bony abnormality. Lungs are hyperinflated. Remote RIGHT rib fractures. IMPRESSION: Hyperinflated lungs.  No acute findings. Electronically Signed   By: Suzy Bouchard M.D.   On: 12/07/2019 19:50   US Abdomen Limited RUQ  Result Date: 12/08/2019 CLINICAL DATA:  Pancreatitis EXAM: ULTRASOUND ABDOMEN LIMITED RIGHT UPPER QUADRANT COMPARISON:  CT 12/07/2019, ultrasound 10/15/2019 FINDINGS: Gallbladder: Status post cholecystectomy. Common bile duct: Diameter: 8.8 mm Liver: No focal lesion identified. Within normal limits in parenchymal echogenicity. Portal vein is patent on color Doppler imaging with normal direction of blood flow towards the liver. Other: Small amount of perihepatic fluid. IMPRESSION: 1. Status post cholecystectomy. Common bile duct measures up to 8.8 mm, likely within normal limits for post cholecystectomy. 2. Trace perihepatic fluid, felt secondary to pancreatitis Electronically Signed   By: Donavan Foil M.D.   On: 12/08/2019 00:12   IMPRESSION:  *68 year old female with acute pancreatitis, ? Focal area of early necrosis.  CBD 9 mm.  Lipase 1682 upon presentation.  Has elevated ALP and WBC count.  Other LFT's normal.  Had ERCP with sphincterotomy and balloon extraction along with placement of a ventral pancreatic duct stent in 10/2018.  On abx, meropenum.   *Likely UTI:  Has indwelling catheter. *COVID status pending.  PLAN: -MRCP today to rule out CBD stone.  Hopefully can avoid ERCP. -Aggressive IV hydration, antiemetics, pain control. -Abx for now,  especially since it looks like she may also have a UTI. -Trend LFT's.  Laban Emperor. Zehr  12/08/2019, 9:12 AM    Attending physician's note   I have taken a history, examined the patient and reviewed the chart. I agree with the Advanced Practitioner's note, impression and recommendations.  68 year old female with acute pancreatitis and dilated CBD.  History of ERCP with sphincterotomy and pancreatic duct stent placement in November 2019. She is s/p cholecystectomy Will obtain MRCP to exclude retained CBD stone Continue IV fluids Pain control Follow-up LFT Will continue to follow, available if have any questions K. Denzil Magnuson , MD [phone]

## 2019-12-08 NOTE — ED Notes (Signed)
Patient in MRI at this time. 

## 2019-12-08 NOTE — ED Notes (Signed)
Blood drawl HIV moved to 5am drawl time

## 2019-12-08 NOTE — ED Notes (Signed)
Ma Hillock would like the nurse to call him.

## 2019-12-08 NOTE — Progress Notes (Signed)
PROGRESS NOTE  [name] KKX:[id number] DOB: 01/17/1951 DOA: 12/07/2019 PCP: Jeanie Sewer, NP  Brief History   [name] is a 68 y.o. 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 coli and VRE, history of seizures presenting to the ED for evaluation of abdominal pain and back pain.  Patient reports having severe epigastric/periumbilical abdominal pain which radiates to her back.  She is not sure when this started, thinks maybe 4 to 5 days ago.  She has also been vomiting.  She has been having chills at home.  Denies history of alcohol use.  Denies history of gallstones.  Reports dysuria and urinary frequency.  No additional history could be obtained from the patient.  ED Course: Afebrile.  Blood pressure significantly elevated with systolic 169 on arrival.  WBC count 15.3 with left shift.  Potassium 6.4.  Bicarb 12.  BUN 24, creatinine 1.6.  Alk phos 210, remainder of LFTs normal.  Lipase 1682.  UA with large amount of leukocytes, 21-50 RBCs, greater than 50 WBCs, and many bacteria.  Urine culture pending.  SARS-CoV-2 PCR test pending.  Chest x-ray showing hyperinflated lungs and no acute findings.  CT abdomen pelvis with contrast showing indistinct appearance of the pancreatic head and uncinate process with associated fluid and fairly extensive inflammatory changes consistent with acute pancreatitis.  No definite organized fluid collection identified.  Punctate focus of gas in the region of inflammatory change at the pancreatic head, uncertain if this is a small focus of gas in the distal common bile duct versus a tiny gas collection related to early necrosis. Patient received treatment for hyperkalemia including albuterol, insulin with D50, and  calcium gluconate.  Started on ceftriaxone for UTI.  Gastroenterology was consulted. She underwent an MRCP that demonstrated acute pancreatitis without choledocholithiasis.  Consultants  . Gastroenterology  Procedures  . None  Antibiotics   Anti-infectives (From admission, onward)   Start     Dose/Rate Route Frequency Ordered Stop   12/08/19 0015  meropenem (MERREM) 1 g in sodium chloride 0.9 % 100 mL IVPB     1 g 200 mL/hr over 30 Minutes Intravenous Every 12 hours 12/08/19 0009     12/07/19 2200  cefTRIAXone (ROCEPHIN) 1 g in sodium chloride 0.9 % 100 mL IVPB  Status:  Discontinued     1 g 200 mL/hr over 30 Minutes Intravenous  Once 12/07/19 2150 12/07/19 2330    .   Subjective  The patient is lying on the gurney in the ED. She is in acute pain.  Objective   Vitals:  Vitals:   12/08/19 1930 12/08/19 2015  BP: (!) 177/69 (!) 176/79  Pulse: (!) 110 (!) 104  Resp: 14 16  Temp:    SpO2: 100% 99%   Exam:  Constitutional:  . The patient is somnolent, but crying out in pain.  Respiratory:  . No increased work of breathing. . No wheezes, rales, or rhonchi . No tactile fremitus Cardiovascular:  . Regular rate and rhythm . No murmurs, ectopy, or gallups. . No lateral PMI. No thrills. Abdomen:  . Abdomen is soft, and non-distended.  Marland Kitchen Epigastric tenderness. . No hernias, masses, or organomegaly . Normoactive bowel sounds.  Musculoskeletal:  . No cyanosis, clubbing, or edema Skin:  . No rashes, lesions, ulcers . palpation of skin: no  induration or nodules Neurologic:  . CN 2-12 intact . Sensation all 4 extremities intact  I have personally reviewed the following:   Today's Data  . Vitals, CMP, CBC  Micro Data  . Urine culture: No growth  Imaging  . MRCP; Acute pancreatitis  Scheduled Meds: . carvedilol  6.25 mg Oral BID  . docusate sodium  100 mg Oral Daily  . donepezil  10 mg Oral QHS  . hydrALAZINE  50 mg Oral TID  . isosorbide mononitrate  30 mg  Oral Daily  . levETIRAcetam  500 mg Oral BID  . Melatonin  3 mg Oral QHS  . phenytoin  300 mg Oral QHS  . scopolamine  1 patch Transdermal Q72H  . sodium bicarbonate  650 mg Oral TID   Continuous Infusions: . meropenem (MERREM) IV Stopped (12/08/19 1113)  .  sodium bicarbonate (isotonic) infusion in sterile water Stopped (12/08/19 1042)    Principal Problem:   Acute pancreatitis Active Problems:   AKI (acute kidney injury) ([place])   Catheter-associated urinary tract infection (HCC)   Hyperkalemia   QT prolongation   LOS: 1 day    A & P  Severe acute pancreatitis: No necrosis on MRCP. GI has been consulted. NPO, pain control. Patient is presenting with complaints of epigastric/periumbilical abdominal pain radiating to her back.  Afebrile.  WBC count 15.3 with left shift.  Lipase significantly elevated at 1682. Alk phos 210, remainder of LFTs normal. CT abdomen pelvis with contrast showing indistinct appearance of the pancreatic head and uncinate process with associated fluid and fairly extensive inflammatory changes consistent with acute pancreatitis.  No definite organized fluid collection identified.  Punctate focus of gas in the region of inflammatory change at the pancreatic head, uncertain if this is a small focus of gas in the distal common bile duct versus a tiny gas collection related to early necrosis. -Discussed the case with Dr. Alessandra Bevels from [place], recommending aggressive IV fluid hydration and a Carbapenem for antibiotic coverage.  Meropenem ordered. -Keep n.p.o. -Aggressive IV fluid hydration (last echo done June 2019 with normal systolic function and H5KT, patient appears dehydrated at this time and has no signs of volume overload) -IV Dilaudid as needed for pain -IV Compazine as needed for nausea/vomiting -Repeat lipase and LFTs in a.m. -Continue to monitor WBC count -Right upper quadrant ultrasound -Check lactic acid level  CAUTI Patient has an indwelling  urinary catheter in place. 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  CT abdomen pelvis done today showing left ureteral stent in place and decreased left hydronephrosis compared to prior study.  UA with large amount of leukocytes, 21-50 RBCs, greater than 50 WBCs, and many bacteria.  Patient son states she is seen by Dr. Nila Nephew in [place] and last office visit was about 2.5 weeks ago. -Meropenem as above -Urine culture pending -Will leave catheter in place for now.  Consult urology in a.m.  AKI on CKD stage III Creatinine 1.6, was 1.2 in April 2020.  Possibly prerenal as patient appears dehydrated on exam. -IV fluid hydration -Continue to monitor renal function -Avoid nephrotoxic agents/contrast  Hyperkalemia, QT prolongation on EKG Question if related to AKI, does take spironolactone at home.  Potassium 6.4.  EKG with QT prolongation (QTC 515).  Patient son states she has been taking Zofran for the past few days for nausea/vomiting, this  could also possibly be contributing to QT prolongation.  Patient received treatment for hyperkalemia in the ED including albuterol, insulin with D50, and calcium gluconate.  -Repeat stat labs to check potassium -Hold spironolactone -Cardiac monitoring -Monitor potassium and magnesium levels -Repeat EKG in a.m. -Avoid QT prolonging drugs if possible  Metabolic acidosis Possibly related to AKI versus ?lactic acidosis.  Bicarb 12. -Check lactic acid level -Bicarb infusion -Continue to monitor bicarb level  Uncontrolled hypertension Systolic 639 on arrival, currently in the 160s to 180s. -IV hydralazine as needed for SBP greater than 180  DVT prophylaxis: SCDs at this time. Code Status: Discussed with the patient's son who confirmed that patient is DNR. Family  Communication: Son updated over the phone. Disposition Plan: Anticipate discharge after clinical improvement.  Zaydon Kinser, DO [facility] Direct contact: see [email or web address]  7PM-7AM contact night coverage as above 12/08/2019, 8:39 PM  LOS: 1 day

## 2019-12-08 NOTE — ED Notes (Signed)
Patient called out asking for help with pain. RN gave pain medicine. Will continue to monitor.

## 2019-12-09 DIAGNOSIS — T83511A Infection and inflammatory reaction due to indwelling urethral catheter, initial encounter: Secondary | ICD-10-CM

## 2019-12-09 DIAGNOSIS — N390 Urinary tract infection, site not specified: Secondary | ICD-10-CM

## 2019-12-09 LAB — BASIC METABOLIC PANEL
Anion gap: 10 (ref 5–15)
BUN: 22 mg/dL (ref 8–23)
CO2: 21 mmol/L — ABNORMAL LOW (ref 22–32)
Calcium: 7.8 mg/dL — ABNORMAL LOW (ref 8.9–10.3)
Chloride: 113 mmol/L — ABNORMAL HIGH (ref 98–111)
Creatinine, Ser: 1.2 mg/dL — ABNORMAL HIGH (ref 0.44–1.00)
GFR calc Af Amer: 54 mL/min — ABNORMAL LOW (ref >60)
GFR calc non Af Amer: 46 mL/min — ABNORMAL LOW (ref >60)
Glucose, Bld: 103 mg/dL — ABNORMAL HIGH (ref 70–99)
Potassium: 3.5 mmol/L (ref 3.5–5.1)
Sodium: 144 mmol/L (ref 135–145)

## 2019-12-09 LAB — GLUCOSE, CAPILLARY
Glucose-Capillary: 73 mg/dL (ref 70–99)
Glucose-Capillary: 94 mg/dL (ref 70–99)
Glucose-Capillary: 95 mg/dL (ref 70–99)
Glucose-Capillary: 90 mg/dL (ref 70–99)
Glucose-Capillary: 86 mg/dL (ref 70–99)

## 2019-12-09 LAB — HEPATIC FUNCTION PANEL
ALT: 7 U/L (ref 0–44)
AST: 15 U/L (ref 15–41)
Albumin: 2.5 g/dL — ABNORMAL LOW (ref 3.5–5.0)
Alkaline Phosphatase: 155 U/L — ABNORMAL HIGH (ref 38–126)
Bilirubin, Direct: 0.2 mg/dL (ref 0.0–0.2)
Indirect Bilirubin: 0.7 mg/dL (ref 0.3–0.9)
Total Bilirubin: 0.9 mg/dL (ref 0.3–1.2)
Total Protein: 5.8 g/dL — ABNORMAL LOW (ref 6.5–8.1)

## 2019-12-09 LAB — MRSA PCR SCREENING: MRSA by PCR: NEGATIVE

## 2019-12-09 LAB — CBC
HCT: 35.3 % — ABNORMAL LOW (ref 36.0–46.0)
Hemoglobin: 10.9 g/dL — ABNORMAL LOW (ref 12.0–15.0)
MCH: 29.1 pg (ref 26.0–34.0)
MCHC: 30.9 g/dL (ref 30.0–36.0)
MCV: 94.4 fL (ref 80.0–100.0)
Platelets: 98 10*3/uL — ABNORMAL LOW (ref 150–400)
RBC: 3.74 MIL/uL — ABNORMAL LOW (ref 3.87–5.11)
RDW: 16.1 % — ABNORMAL HIGH (ref 11.5–15.5)
WBC: 14 10*3/uL — ABNORMAL HIGH (ref 4.0–10.5)
nRBC: 0 % (ref 0.0–0.2)

## 2019-12-09 LAB — LIPASE, BLOOD: Lipase: 521 U/L — ABNORMAL HIGH (ref 11–51)

## 2019-12-09 MED ORDER — LACTATED RINGERS IV SOLN: INTRAVENOUS | Status: DC

## 2019-12-09 MED ORDER — DEXTROSE 50 % IV SOLN: 12.5000 g | INTRAVENOUS | Status: AC

## 2019-12-09 NOTE — Progress Notes (Signed)
PROGRESS NOTE  [name]:[id number] DOB: 09-Oct-1951 DOA: 12/07/2019 PCP: Jeanie Sewer, NP  Brief History   [name] is a 68 y.o. 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 coli and VRE, history of seizures presenting to the ED for evaluation of abdominal pain and back pain.  Patient reports having severe epigastric/periumbilical abdominal pain which radiates to her back.  She is not sure when this started, thinks maybe 4 to 5 days ago.  She has also been vomiting.  She has been having chills at home.  Denies history of alcohol use.  Denies history of gallstones.  Reports dysuria and urinary frequency.  No additional history could be obtained from the patient.  ED Course: Afebrile.  Blood pressure significantly elevated with systolic 884 on arrival.  WBC count 15.3 with left shift.  Potassium 6.4.  Bicarb 12.  BUN 24, creatinine 1.6.  Alk phos 210, remainder of LFTs normal.  Lipase 1682.  UA with large amount of leukocytes, 21-50 RBCs, greater than 50 WBCs, and many bacteria.  Urine culture pending.  SARS-CoV-2 PCR test pending.  Chest x-ray showing hyperinflated lungs and no acute findings.  CT abdomen pelvis with contrast showing indistinct appearance of the pancreatic head and uncinate process with associated fluid and fairly extensive inflammatory changes consistent with acute pancreatitis.  No definite organized fluid collection identified.  Punctate focus of gas in the region of inflammatory change at the pancreatic head, uncertain if this is a small focus of gas in the distal common bile duct versus a tiny gas collection related to early necrosis. Patient received treatment for hyperkalemia including albuterol, insulin with D50, and  calcium gluconate.  Started on ceftriaxone for UTI.  Gastroenterology was consulted. She underwent an MRCP that demonstrated acute pancreatitis, no biliary stones, some narrowing of ducts in the area of the pancreatic head likely due to acute inflammation/compression. No ERCP necessary.  Consultants  . Gastroenterology  Procedures  . None  Antibiotics   Anti-infectives (From admission, onward)   Start     Dose/Rate Route Frequency Ordered Stop   12/08/19 0015  meropenem (MERREM) 1 g in sodium chloride 0.9 % 100 mL IVPB     1 g 200 mL/hr over 30 Minutes Intravenous Every 12 hours 12/08/19 0009     12/07/19 2200  cefTRIAXone (ROCEPHIN) 1 g in sodium chloride 0.9 % 100 mL IVPB  Status:  Discontinued     1 g 200 mL/hr over 30 Minutes Intravenous  Once 12/07/19 2150 12/07/19 2330      Subjective  The patient is resting comfortably. She states that she is feeling better.  Objective   Vitals:  Vitals:   12/09/19 1200 12/09/19 1400  BP:  135/76  Pulse:  88  Resp:  12  Temp: 97.7 F (36.5 C)   SpO2:  100%   Exam:  Constitutional:  . The patient is awake, alert, and oriented x 3. Mild distress from abdominal pain. Respiratory:  . No increased work of breathing. . No wheezes, rales, or rhonchi . No tactile fremitus Cardiovascular:  . Regular rate and rhythm . No murmurs, ectopy, or gallups. . No lateral PMI. No thrills. Abdomen:  Marland Kitchen Mild abdominal tenderness. It is soft, and non-distended.  . No hernias, masses, or organomegaly . Normoactive bowel sounds.  Musculoskeletal:  .  No cyanosis, clubbing, or edema Skin:  . No rashes, lesions, ulcers . palpation of skin: no induration or nodules Neurologic:  . CN 2-12 intact . Sensation all 4 extremities intact  I have personally reviewed the following:   Today's Data  . Vitals, CMP, CBC, Lipase,   Micro Data  . Urine culture: Multiple species present.  Imaging  . MRCP; Acute pancreatitis, no biliary stones, some  narrowing of ducts in the area of the pancreatic head likely due to acute inflammation/compression.   Scheduled Meds: . carvedilol  6.25 mg Oral BID  . Chlorhexidine Gluconate Cloth  6 each Topical Daily  . dextrose  12.5 g Intravenous STAT  . docusate sodium  100 mg Oral Daily  . donepezil  10 mg Oral QHS  . hydrALAZINE  50 mg Oral TID  . isosorbide mononitrate  30 mg Oral Daily  . levETIRAcetam  500 mg Oral BID  . mouth rinse  15 mL Mouth Rinse BID  . Melatonin  3 mg Oral QHS  . phenytoin  300 mg Oral QHS  . scopolamine  1 patch Transdermal Q72H  . sodium bicarbonate  650 mg Oral TID   Continuous Infusions: . lactated ringers 100 mL/hr at 12/09/19 1520  . meropenem (MERREM) IV Stopped (12/09/19 1055)    Principal Problem:   Acute pancreatitis Active Problems:   AKI (acute kidney injury) ([place])   Catheter-associated urinary tract infection (HCC)   Hyperkalemia   QT prolongation   LOS: 2 days    A & P  Severe acute pancreatitis: GI has been consulted. No necrosis on MRCP. MRCP did demonstrate acute pancreatitis, no biliary stones, some narrowing of ducts in the area of the pancreatic head likely due to acute inflammation/compression. She was admitted on NPO..  Meropenem ordered. She continues to receive IV fluids. She has been advanced to a clear liquid diet. She is receiving IV dilaudid for pain control and Compazine for an antiemetic.   CAUTI: Urine culture has grown out multiple species. Continue Merrem. Will send another sample for culture. Patient has an indwelling urinary catheter in place. 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  CT abdomen pelvis done on admission showed left ureteral stent in place and decreased left hydronephrosis compared to prior study.  UA with large amount of leukocytes,  21-50 RBCs, greater than 50 WBCs, and many bacteria.    AKI on CKD stage III: Creatinine 1.6, was 1.2 in April 2020, likely this is her baseline. Improved to 1.2 today with IV fluid. Avoid nephrotoxins and hypotension. Monitor creatinine, electrolytes, and volume status.  Hyperkalemia with QT prolongation on EKG: Hyperkalemia resolved. Will recheck EKG to follow prolonged QT. QTC 515 on admission. Spironolactone is held and creatinine improved.   Metabolic acidosis: Pt received IV bicarbonate since admission. Acidosis is resolved as is anion gap. Discontinued today. Start LR. Monitor BMP.  Uncontrolled hypertension: Resolved with Coreg and Hydralazine. Monitor.  I have seen and examined this patient myself. I have spent 35 minutes in her evaluation and care.  DVT prophylaxis: SCDs at this time. Code Status: Discussed with the patient's son who confirmed that patient is DNR. Family Communication: Son updated over the phone. Disposition Plan: Anticipate discharge after clinical improvement.  [name] Riveron, DO [facility] Direct contact: see [email or web address]  7PM-7AM contact night coverage as above 12/09/2019, 4:40 PM  LOS: 1 day

## 2019-12-09 NOTE — Progress Notes (Signed)
This patient's sodium bicarbonate bag had run low and was ringing out KVO. This RN looked in the medication room and was unable to find a bag of sodium bicarbonate with the patient specific label on it. Ara Kussmaul., RN requested a new bag from pharmacy.  This RN did find a bag with a neighboring patient's label attached (1236), whose RN stated that she did not need that bag. Rather than having to pause the patient's infusion of sodium bicarbonate due to insufficient volume, this RN and Paulyn M. RN double verified the correct dose and concentration of the already made bag and administered to this patient (1234) while waiting for the new bag with scannable patient specific label to come up. The pharmacy tech then came to the floor and asked why we had requested a new bag of sodium bicarbonate if there was one hanging. This RN tried to explain the situation and that I planned to spike the new bag as soon as it was sent up to be able to properly scan it, or I could make it work with a new patient-specific label. The pharmacy tech said ok and left. One hour after this all occurred, the order for sodium bicarbonate was discontinued and changed to lactated ringer. Will continue to monitor.

## 2019-12-09 NOTE — Progress Notes (Signed)
Initial Nutrition Assessment  DOCUMENTATION CODES:   Severe malnutrition in context of chronic illness, Underweight  INTERVENTION:  - diet advancement as medically feasible. - will order Boost Breeze TID, each supplement provides 250 kcal and 9 grams of protein. - will order 30 mL Prostat BID, each supplement provides 100 kcal and 15 grams of protein. - will order daily multivitamin with minerals.   NUTRITION DIAGNOSIS:   Severe Malnutrition related to chronic illness as evidenced by moderate fat depletion, severe fat depletion, severe muscle depletion.  GOAL:   Patient will meet greater than or equal to 90% of their needs  MONITOR:   Diet advancement, PO intake, Supplement acceptance, Labs, Weight trends  REASON FOR ASSESSMENT:   Other (Comment)(underweight BMI)  ASSESSMENT:   68 y.o. female with medical history significant of advanced Alzheimer's dementia, subacute delirium, toxic encephalopathy, right hemicolectomy complicated by blind loop syndrome and small bowel bacterial overgrowth, stage 3 CKD, anemia, COPD, HTN, hyperlipidemia, CVA, PVD, history of MDRO E. coli and VRE, and history of seizures. She presented to the ED for evaluation of severe abdominal pain and back pain.  She thinks it may have started 4-5 days PTA. She also reported several episodes of emesis and chills. She reported dysuria and urinary frequency.  Patient has been NPO since admission. There is a possibility for diet advancement later today; will order oral nutrition supplements in anticipation.   Patient reports that she last had solid food 3 days ago and she believes she ate chicken at that time. She states that nausea has resolved and abdominal pain is much improved, only slight discomfort.  She reports that she has chronic lower abdominal pain d/t bladder issues and that she thinks that worsening of this discomfort/pain began ~1 month ago.   Her appetite fluctuates at baseline and she was unable  to outline a typical day of eating for her before acute illness.  Per chart review, current weight is 82 lb, weight on 6/10 was 83 lb, weight on 5/19 was 88 lb, and weight on 4/17 was 102 lb. This would indicate 20 lb weight loss (19.6% body weight) in the past 8.5 months; significant for time frame.   Per notes: - severe acute pancreatitis--no necrosis seen on MRCP - CAUTI--has indwelling foley--CT abd/pelvis showed L ureteral stent in place and decreased L hydronephrosis compared to previous study - AKI on stage 3 CKD - hyperkalemia--resolved - metabolic acidosis - uncontrolled HTN   Labs reviewed; Cl: 113 mmol/l, creatinine: 1.2 mg/dl, Ca: 7.8 mg/dl, Alk Phos elevated, lipase: 927 units/L, GFR: 46 ml/min. Medications reviewed; 100 mg colace/day, 3 mg melatonin/day, 650 mg sodium bicarb TID.  IVF; 150 mEq sodium bicarb-sterile water @ 100 ml/hr.    NUTRITION - FOCUSED PHYSICAL EXAM:    Most Recent Value  Orbital Region  Moderate depletion  Upper Arm Region  Moderate depletion  Thoracic and Lumbar Region  Severe depletion  Buccal Region  Moderate depletion  Temple Region  Moderate depletion  Clavicle Bone Region  Severe depletion  Clavicle and Acromion Bone Region  Severe depletion  Scapular Bone Region  Unable to assess  Dorsal Hand  Moderate depletion  Patellar Region  Severe depletion  Anterior Thigh Region  Severe depletion  Posterior Calf Region  Severe depletion  Edema (RD Assessment)  None  Hair  Reviewed  Eyes  Reviewed  Mouth  Reviewed  Skin  Reviewed  Nails  Reviewed       Diet Order:   Diet Order  Diet NPO time specified  Diet effective now              EDUCATION NEEDS:   Not appropriate for education at this time  Skin:  Skin Assessment: Reviewed RN Assessment  Last BM:  PTA/unknown  Height:   Ht Readings from Last 1 Encounters:  12/07/19 5' (1.524 m)    Weight:   Wt Readings from Last 1 Encounters:  12/07/19 37.2 kg     Ideal Body Weight:  45.4 kg  BMI:  Body mass index is 16.01 kg/m.  Estimated Nutritional Needs:   Kcal:  1560-1710 kcal  Protein:  75-90 grams  Fluid:  >/= 2 L/day     Jarome Matin, MS, RD, LDN, [facility] Clinical Dietitian Pager # [phone] After hours/weekend pager # [phone]

## 2019-12-09 NOTE — Progress Notes (Addendum)
[place] [place] [facility] Progress Note  CC:  Pancreatitis   Subjective:  Feeling better.  Says not so much pain, but stomach "feels weird".  Looks better.  Drinking some water.  MRCP negative for biliary stones.  ALP trending down and other LFT's remain normal.  Objective:  Vital signs in last 24 hours: Temp:  [98.6 F (37 C)] 98.6 F (37 C) (12/30 0400) Pulse Rate:  [77-118] 87 (12/30 0800) Resp:  [9-24] 12 (12/30 0800) BP: (107-218)/(40-110) 158/64 (12/30 0800) SpO2:  [94 %-100 %] 100 % (12/30 0800) Last BM Date: (prior to arrival) General:  Alert, chronically ill-appearing, in NAD Heart:  Regular rate and rhythm; no murmurs Pulm:  CTAB.  No increased WOB. Abdomen:  Soft, non-distended.  BS present.  Minimal TTP. Extremities:  Without edema.  Intake/Output from previous day: 12/29 0701 - 12/30 0700 In: 2122.2 [I.V.:2022.2; IV Piggyback:100] Out: [id number] [Urine:1225]  Lab Results: Recent Labs    12/07/19 2030 12/08/19 0500 12/09/19 0219  WBC 15.3* 14.8* 14.0*  HGB 12.1 12.3 10.9*  HCT 39.2 40.0 35.3*  PLT 242 172 98*   BMET Recent Labs    12/07/19 2309 12/08/19 0500 12/09/19 0219  NA 135 141 144  K 4.4 4.0 3.5  CL 109 114* 113*  CO2 15* 17* 21*  GLUCOSE 188* 108* 103*  BUN 26* 21 22  CREATININE 1.51* 1.36* 1.20*  CALCIUM 8.4* 8.2* 7.8*   LFT Recent Labs    12/08/19 0500  PROT 6.5  ALBUMIN 2.8*  AST 13*  ALT 6  ALKPHOS 193*  BILITOT 0.6  BILIDIR 0.1  IBILI 0.5   CT ABDOMEN PELVIS W CONTRAST  Result Date: 12/07/2019 CLINICAL DATA:  Abdominal pain, vomiting EXAM: CT ABDOMEN AND PELVIS WITH CONTRAST TECHNIQUE: Multidetector CT imaging of the abdomen and pelvis was performed using the standard protocol following bolus administration of intravenous contrast. CONTRAST:  74mL OMNIPAQUE IOHEXOL 300 MG/ML  SOLN COMPARISON:  CT 10/27/2018, 10/20/2019, 10/13/2019, 05/08/2019 FINDINGS: Lower chest: Lung bases demonstrate no acute consolidation or pleural  effusion. The heart size is within normal limits. Hepatobiliary: No focal hepatic abnormality. Small amount of pneumobilia presumably related to prior sphincterotomy. Status post cholecystectomy. Mildly enhancing extrahepatic common bile duct. Pancreas: Indistinct appearance of the head and uncinate process with fluid and inflammatory changes consistent with acute pancreatitis. No organized fluid collections at this time. Punctate focus of gas in the region of inflammatory change at the pancreatic head. No ductal dilatation Spleen: Splenic granuloma Adrenals/Urinary Tract: Adrenal glands are normal. Moderate gas within the right renal collecting system. Similar low-density lesion mid to upper pole right kidney. Left ureteral stent remains in place, the distal pigtail terminates within the right anterior pelvis, presumably within decompressed and thinned reconstructed bladder. Indwelling Foley catheter is noted. There is decreased left hydronephrosis compared to prior. Numerous subcentimeter hypodense left renal lesions. Mild urothelial enhancement on the right. Stomach/Bowel: The stomach is nonenlarged. Duodenum is indistinct and poorly defined, likely due to inflammation from adjacent pancreatitis. No dilated small bowel. No colon wall thickening. Bowel anastomosis in the right hemiabdomen as before. Vascular/Lymphatic: Moderate severe aortic atherosclerosis without aneurysm. Left iliac stent. No increasing adenopathy. Reproductive: Status post hysterectomy. No adnexal mass. Multiple clips in the pelvis. Other: No free air. Small ventral wall defect to the right of the umbilical region without significant hernia. Musculoskeletal: No acute or suspicious osseous abnormality. IMPRESSION: 1. Indistinct appearance of the pancreatic head and uncinate process with associated fluid and fairly extensive inflammatory  changes consistent with an acute pancreatitis. No definite organized fluid collections at this time. Punctate  focus of gas in the region of inflammatory change at the pancreatic head, uncertain if this is a small focus of gas in the distal common bile duct versus a tiny gas collection related to early necrosis. Attention on follow-up imaging is recommended. 2. Indistinct appearance of the duodenum presumably due to reactive inflammatory changes from the pancreas. 3. Slight increased gas within the right renal collecting system. Similar positioning of left ureteral stent, with interval decrease in degree of left hydronephrosis. 4. Small amount of pneumobilia, felt related to prior sphincterotomy. This is noted on prior exams. Electronically Signed   By: Donavan Foil M.D.   On: 12/07/2019 22:15   MR ABDOMEN MRCP WO CONTRAST  Result Date: 12/08/2019 CLINICAL DATA:  Pancreatitis suspected, history of CBD stones by report. EXAM: MRI ABDOMEN WITHOUT CONTRAST  (INCLUDING MRCP) TECHNIQUE: Multiplanar multisequence MR imaging of the abdomen was performed. Heavily T2-weighted images of the biliary and pancreatic ducts were obtained, and three-dimensional MRCP images were rendered by post processing. COMPARISON:  Abdominal sonogram of 12/08/2019 and abdominal CT 12/07/2019. FINDINGS: Lower chest: Basilar atelectasis and signs of trace pleural fluid bilaterally. Lung bases not well assessed on MRI. Hepatobiliary: Liver is normal. Biliary tree mildly dilated with tapered narrowing showing some irregularity in the area of the pancreatic head in the setting of active pancreatic inflammation. No signs of visible filling defect though there was some gas at the ampulla on the CT evaluation of 12/07/2019. Pancreas: Signs of acute interstitial pancreatitis with diffuse anterior pararenal stranding and inflammation, inflammation extending into the root of the small bowel mesentery. Spleen:  Spleen is unremarkable. Adrenals/Urinary Tract:  Adrenal glands are normal. Signs of left nephroureteral stent with patulous collecting systems with  slight increase in fullness accounting for differences in technique. Gas in the collecting systems of the right kidney with cortical scarring on the right though not as severe as on the left. Stomach/Bowel: No signs of acute gastrointestinal process. There is abundant gas in the colon that leads to susceptibility artifact on many sequences. Bowel is incompletely imaged on today's study. Vascular/Lymphatic: No signs of retroperitoneal lymphadenopathy. Numerous small lymph nodes likely reactive in the setting of acute inflammation. Other: Extensive inflammation in the anterior pararenal space. Small volume ascites. No discrete, focal fluid collection. Musculoskeletal: No acute bone finding or destructive bone process. IMPRESSION: 1. Signs of acute interstitial pancreatitis with diffuse anterior pararenal stranding, non loculated fluid and inflammation extending also into the root of the small bowel mesentery. 2. Mild intra and extrahepatic biliary ductal dilatation with tapered narrowing in the area of the pancreatic head in the setting of active pancreatic inflammation. No evidence of choledocholithiasis. 3. Gas in the collecting systems of the right kidney with cortical scarring on the right though not as severe as on the left. Slight increase in fullness of left intrarenal collecting systems is suggested since the previous exam though comparison on different modalities is challenging, attention on follow-up. 4. Basilar atelectasis and trace pleural fluid bilaterally. 5. Study limited due to technical factors with gross patient motion on some images. Electronically Signed   By: Zetta Bills M.D.   On: 12/08/2019 14:14   MR 3D Recon At Scanner  Result Date: 12/08/2019 CLINICAL DATA:  Pancreatitis suspected, history of CBD stones by report. EXAM: MRI ABDOMEN WITHOUT CONTRAST  (INCLUDING MRCP) TECHNIQUE: Multiplanar multisequence MR imaging of the abdomen was performed. Heavily T2-weighted images of  the biliary  and pancreatic ducts were obtained, and three-dimensional MRCP images were rendered by post processing. COMPARISON:  Abdominal sonogram of 12/08/2019 and abdominal CT 12/07/2019. FINDINGS: Lower chest: Basilar atelectasis and signs of trace pleural fluid bilaterally. Lung bases not well assessed on MRI. Hepatobiliary: Liver is normal. Biliary tree mildly dilated with tapered narrowing showing some irregularity in the area of the pancreatic head in the setting of active pancreatic inflammation. No signs of visible filling defect though there was some gas at the ampulla on the CT evaluation of 12/07/2019. Pancreas: Signs of acute interstitial pancreatitis with diffuse anterior pararenal stranding and inflammation, inflammation extending into the root of the small bowel mesentery. Spleen:  Spleen is unremarkable. Adrenals/Urinary Tract:  Adrenal glands are normal. Signs of left nephroureteral stent with patulous collecting systems with slight increase in fullness accounting for differences in technique. Gas in the collecting systems of the right kidney with cortical scarring on the right though not as severe as on the left. Stomach/Bowel: No signs of acute gastrointestinal process. There is abundant gas in the colon that leads to susceptibility artifact on many sequences. Bowel is incompletely imaged on today's study. Vascular/Lymphatic: No signs of retroperitoneal lymphadenopathy. Numerous small lymph nodes likely reactive in the setting of acute inflammation. Other: Extensive inflammation in the anterior pararenal space. Small volume ascites. No discrete, focal fluid collection. Musculoskeletal: No acute bone finding or destructive bone process. IMPRESSION: 1. Signs of acute interstitial pancreatitis with diffuse anterior pararenal stranding, non loculated fluid and inflammation extending also into the root of the small bowel mesentery. 2. Mild intra and extrahepatic biliary ductal dilatation with tapered narrowing  in the area of the pancreatic head in the setting of active pancreatic inflammation. No evidence of choledocholithiasis. 3. Gas in the collecting systems of the right kidney with cortical scarring on the right though not as severe as on the left. Slight increase in fullness of left intrarenal collecting systems is suggested since the previous exam though comparison on different modalities is challenging, attention on follow-up. 4. Basilar atelectasis and trace pleural fluid bilaterally. 5. Study limited due to technical factors with gross patient motion on some images. Electronically Signed   By: Zetta Bills M.D.   On: 12/08/2019 14:14   DG Chest Portable 1 View  Result Date: 12/07/2019 CLINICAL DATA:  Pt c/o lower back and abd pain x "a couple days." Pt has a foley catheter. SOB, concern for PNA by ordering MD. H/o COPD, Stroke, HTN. Smoker. shortness of breath, crackles, concern for pna EXAM: PORTABLE CHEST 1 VIEW COMPARISON:  10/19/2019 FINDINGS: Normal mediastinum and cardiac silhouette. Normal pulmonary vasculature. No evidence of effusion, infiltrate, or pneumothorax. No acute bony abnormality. Lungs are hyperinflated. Remote RIGHT rib fractures. IMPRESSION: Hyperinflated lungs.  No acute findings. Electronically Signed   By: Suzy Bouchard M.D.   On: 12/07/2019 19:50   US Abdomen Limited RUQ  Result Date: 12/08/2019 CLINICAL DATA:  Pancreatitis EXAM: ULTRASOUND ABDOMEN LIMITED RIGHT UPPER QUADRANT COMPARISON:  CT 12/07/2019, ultrasound 10/15/2019 FINDINGS: Gallbladder: Status post cholecystectomy. Common bile duct: Diameter: 8.8 mm Liver: No focal lesion identified. Within normal limits in parenchymal echogenicity. Portal vein is patent on color Doppler imaging with normal direction of blood flow towards the liver. Other: Small amount of perihepatic fluid. IMPRESSION: 1. Status post cholecystectomy. Common bile duct measures up to 8.8 mm, likely within normal limits for post cholecystectomy. 2.  Trace perihepatic fluid, felt secondary to pancreatitis Electronically Signed   By: Madie Reno.D.  On: 12/08/2019 00:12   Assessment / Plan: *68 year old female with acute pancreatitis, ? Focal area of early necrosis.  CBD 9 mm.  Lipase 1682 upon presentation.  Has elevated ALP and WBC count.  Other LFT's normal.  Had ERCP with sphincterotomy and balloon extraction along with placement of a ventral pancreatic duct stent in 10/2018.  On abx, meropenum.  MRCP negative for CBD stone, but does show some ductal narrowing in the area of the pancreatic head, likely due to acute inflammation/compression.  LFT's remain normal/ALP trending down this AM.  Clinically improved as well. *Likely UTI:  Has indwelling catheter. *COVID negative  -Aggressive IV hydration (currently on 100 cc/hr of sodium bicarb), antiemetics, pain control. -Abx for now, especially since it looks like she may also have a UTI. -Trend LFT's. -Will allow clear liquids. -No need for ERCP.   LOS: 2 days   Laban Emperor. Zehr  12/09/2019, 9:14 AM    Attending physician's note   I have taken an interval history, reviewed the chart and examined the patient. I agree with the Advanced Practitioner's note, impression and recommendations.   MRCP negative for choledocholithiasis.  CBD narrowing near pancreatic head likely secondary to inflammation with acute pancreatitis.  LFT are stable Likely etiology is biliary pancreatitis, she could have passed sludge.  She is s/p cholecystectomy No indication for ERCP Continue IV fluids, pain control and supportive therapy Okay to start clear liquids today and slowly advance as tolerated  Has indwelling catheter and stents and ureter, on meropenem for antibiotic coverage  We will sign off, available if have any additional questions or concerns  K. Denzil Magnuson , MD [phone]

## 2019-12-10 LAB — GLUCOSE, CAPILLARY
Glucose-Capillary: 135 mg/dL — ABNORMAL HIGH (ref 70–99)
Glucose-Capillary: 84 mg/dL (ref 70–99)
Glucose-Capillary: 85 mg/dL (ref 70–99)
Glucose-Capillary: 93 mg/dL (ref 70–99)
Glucose-Capillary: 94 mg/dL (ref 70–99)

## 2019-12-10 LAB — COMPREHENSIVE METABOLIC PANEL
ALT: 6 U/L (ref 0–44)
AST: 15 U/L (ref 15–41)
Albumin: 2 g/dL — ABNORMAL LOW (ref 3.5–5.0)
Alkaline Phosphatase: 116 U/L (ref 38–126)
Anion gap: 10 (ref 5–15)
BUN: 20 mg/dL (ref 8–23)
CO2: 26 mmol/L (ref 22–32)
Calcium: 6.9 mg/dL — ABNORMAL LOW (ref 8.9–10.3)
Chloride: 100 mmol/L (ref 98–111)
Creatinine, Ser: 1.05 mg/dL — ABNORMAL HIGH (ref 0.44–1.00)
GFR calc Af Amer: >60 mL/min (ref >60)
GFR calc non Af Amer: 55 mL/min — ABNORMAL LOW (ref >60)
Glucose, Bld: 93 mg/dL (ref 70–99)
Potassium: 3 mmol/L — ABNORMAL LOW (ref 3.5–5.1)
Sodium: 136 mmol/L (ref 135–145)
Total Bilirubin: 0.7 mg/dL (ref 0.3–1.2)
Total Protein: 4.6 g/dL — ABNORMAL LOW (ref 6.5–8.1)

## 2019-12-10 LAB — URINE CULTURE

## 2019-12-10 LAB — CBC
HCT: 27.8 % — ABNORMAL LOW (ref 36.0–46.0)
Hemoglobin: 8.8 g/dL — ABNORMAL LOW (ref 12.0–15.0)
MCH: 29.8 pg (ref 26.0–34.0)
MCHC: 31.7 g/dL (ref 30.0–36.0)
MCV: 94.2 fL (ref 80.0–100.0)
Platelets: 92 10*3/uL — ABNORMAL LOW (ref 150–400)
RBC: 2.95 MIL/uL — ABNORMAL LOW (ref 3.87–5.11)
RDW: 16.1 % — ABNORMAL HIGH (ref 11.5–15.5)
WBC: 11 10*3/uL — ABNORMAL HIGH (ref 4.0–10.5)
nRBC: 0 % (ref 0.0–0.2)

## 2019-12-10 LAB — LIPASE, BLOOD: Lipase: 154 U/L — ABNORMAL HIGH (ref 11–51)

## 2019-12-10 MED ORDER — POTASSIUM CHLORIDE CRYS ER 20 MEQ PO TBCR
40.0000 meq | EXTENDED_RELEASE_TABLET | Freq: Two times a day (BID) | ORAL | Status: AC
  Administered 2019-12-10 (×2): 40 meq via ORAL
  Filled 2019-12-10 (×3): qty 2

## 2019-12-10 MED ORDER — SODIUM CHLORIDE 0.9 % IV SOLN
1.0000 g | Freq: Two times a day (BID) | INTRAVENOUS | Status: AC
  Administered 2019-12-10 – 2019-12-11 (×3): 1 g via INTRAVENOUS
  Filled 2019-12-10 (×3): qty 1

## 2019-12-10 NOTE — Progress Notes (Addendum)
PROGRESS NOTE    TAKERRIA PUMA  [id number] DOB: 04-25-1951 DOA: 12/07/2019 PCP: Jeanie Sewer, NP   Brief Narrative:   68 year old with prior history of Alzheimer's dementia, right hemicolectomy.  Complicated by blind loop syndrome, small bowel bacterial overgrowth, stage III CKD, COPD, anemia, hypertension, CVA, PAD referral vascular disease, hyperlipidemia, history of MDR E. coli, VRE, history of seizures presents to ED for evaluation of abdominal pain.  She was found to have acute pancreatitis with elevated lipase levels.  Gastroenterology was consulted she underwent an MRCP that demonstrated acute pancreatitis without any biliary stones.  She was started on clear liquid diet as her abdominal pain  was improving and her lipase levels were improving.  Assessment & Plan:   Principal Problem:   Acute pancreatitis Active Problems:   AKI (acute kidney injury) ([place])   Catheter-associated urinary tract infection (HCC)   Hyperkalemia   QT prolongation   Severe acute pancreatitis She underwent MRCP on admission showing acute pancreatitis without any biliary stones, some narrowing of ducts in the area of the pancreatic head secondary to acute inflammation/compression.  She was started on IV antibiotics empirically.  There are no necrosis seen at this time.  She was also started on IV fluids and clear liquid diet as her pain and her symptoms are improving.  On exam today she reports she had one vomiting yesterday prior to starting clear liquid diet.  Nausea has improved.  And abdominal pain has improved but not resolved.  She is requesting to be advanced to full liquid diet.  Recommended to change to full liquid diet but if pain reoccurs we will change her back to n.p.o.   Catheter associated urinary tract infection Patient has an indwelling Foley catheter in place placed by urology.  She has a history of enterocystoplasty/bladder augmentation requiring an indwelling stent secondary to  ureteral stricture.  CT abdomen and pelvis showed left ureter stent in place and decreased hydronephrosis of the left kidney when compared to the prior study.  Urine analysis was abnormal on admission and urine cultures show multiple bacteria the sample from 12/07/2019.  Repeat culture sent yesterday on 12/09/2019 and are pending.   Hypertension:  Well controlled, resume coreg.    Dementia:  No agitation overnight, resume Aricept.    H/o seizures:  No seizures overnight , resume keppra.    Hypokalemia; replaced and repeat in am.  Get magnesium levels.    AKI;  Probably from dehydration;  Improving with IV fluids.    Anemia of chronic disease;  Baseline hemoglobin appears to be between 8 t 9.  She was admitted with a hemoglobin of 12 which is probably a hemoconcentrated sample. On hydration hemoglobin has dropped to 8.8 which is her baseline.   Mild leukocytosis suspect from pancreatitis  Prolonged QTC; Probably from electrolyte abnormalities.  Replace potassium and magnesium keep K>4 and Magnesium >2.    DVT prophylaxis: SCD'S Code Status: DNR.  Family Communication: none at bedside.  Disposition Plan: Pending clinical improvement and pending PT evaluation.   Consultants:   Gastroenterology.   Procedures: MRCP.   Antimicrobials Anti-infectives (From admission, onward)   Start     Dose/Rate Route Frequency Ordered Stop   12/08/19 0015  meropenem (MERREM) 1 g in sodium chloride 0.9 % 100 mL IVPB     1 g 200 mL/hr over 30 Minutes Intravenous Every 12 hours 12/08/19 0009     12/07/19 2200  cefTRIAXone (ROCEPHIN) 1 g in sodium chloride 0.9 % 100 mL  IVPB  Status:  Discontinued     1 g 200 mL/hr over 30 Minutes Intravenous  Once 12/07/19 2150 12/07/19 2330      Subjective: ABD PAIN improved but not resolved.  One episode of vomiting yesterday. \nausea improving.  Requesting to advance diet to full liquid diet.   Objective: Patient is alert and responding to  questions appropriately,  Vitals:   12/10/19 0341 12/10/19 0400 12/10/19 0500 12/10/19 0600  BP:  (!) 148/49 (!) 138/45 117/89  Pulse:  86 79 77  Resp:  16 13 16   Temp: 98.3 F (36.8 C)     TempSrc: Oral     SpO2:  98% 94% 97%  Weight:      Height:        Intake/Output Summary (Last 24 hours) at 12/10/2019 0809 Last data filed at 12/10/2019 0300 Gross per 24 hour  Intake 2179.99 ml  Output 250 ml  Net 1929.99 ml   Filed Weights   12/07/19 2051  Weight: 37.2 kg    Examination:  General exam: Appears calm and comfortable  Respiratory system: Clear to auscultation. Respiratory effort normal. Cardiovascular system: S1 & S2 heard, RRR. No pedal edema. Gastrointestinal system: Abdomen is soft, nontender generalized, nondistended, bowel sounds heard and normal Central nervous system: Alert and oriented.  Grossly nonfocal Extremities: Symmetric 5 x 5 power. Skin: No rashes, lesions or ulcers Psychiatry: . Mood & affect appropriate.     Data Reviewed: I have personally reviewed following labs and imaging studies  CBC: Recent Labs  Lab 12/07/19 2030 12/08/19 0500 12/09/19 0219 12/10/19 0219  WBC 15.3* 14.8* 14.0* 11.0*  NEUTROABS 12.7* 12.2*  --   --   HGB 12.1 12.3 10.9* 8.8*  HCT 39.2 40.0 35.3* 27.8*  MCV 97.0 95.9 94.4 94.2  PLT 242 172 98* 92*   Basic Metabolic Panel: Recent Labs  Lab 12/07/19 1913 12/07/19 2309 12/07/19 2320 12/08/19 0500 12/09/19 0219 12/10/19 0219  NA 136 135  --  141 144 136  K 6.4* 4.4  --  4.0 3.5 3.0*  CL 111 109  --  114* 113* 100  CO2 12* 15*  --  17* 21* 26  GLUCOSE 114* 188*  --  108* 103* 93  BUN 24* 26*  --  21 22 20   CREATININE 1.62* 1.51*  --  1.36* 1.20* 1.05*  CALCIUM 8.7* 8.4*  --  8.2* 7.8* 6.9*  MG  --   --  2.0  --   --   --    GFR: Estimated Creatinine Clearance: 30.1 mL/min (A) (by C-G formula based on SCr of 1.05 mg/dL (H)). Liver Function Tests: Recent Labs  Lab 12/07/19 1913 12/08/19 0500  12/09/19 0219 12/10/19 0219  AST 21 13* 15 15  ALT 7 6 7 6   ALKPHOS 210* 193* 155* 116  BILITOT 1.2 0.6 0.9 0.7  PROT 7.5 6.5 5.8* 4.6*  ALBUMIN 3.2* 2.8* 2.5* 2.0*   Recent Labs  Lab 12/07/19 1913 12/08/19 0500 12/09/19 0219 12/10/19 0219  LIPASE 1,682* 927* 521* 154*   No results for input(s): AMMONIA in the last 168 hours. Coagulation Profile: No results for input(s): INR, PROTIME in the last 168 hours. Cardiac Enzymes: No results for input(s): CKTOTAL, CKMB, CKMBINDEX, TROPONINI in the last 168 hours. BNP (last 3 results) No results for input(s): PROBNP in the last 8760 hours. HbA1C: No results for input(s): HGBA1C in the last 72 hours. CBG: Recent Labs  Lab 12/09/19 1553 12/09/19 1557 12/09/19 1937 12/09/19  2304 12/10/19 0305  GLUCAP 95 94 90 86 135*   Lipid Profile: No results for input(s): CHOL, HDL, LDLCALC, TRIG, CHOLHDL, LDLDIRECT in the last 72 hours. Thyroid Function Tests: No results for input(s): TSH, T4TOTAL, FREET4, T3FREE, THYROIDAB in the last 72 hours. Anemia Panel: No results for input(s): VITAMINB12, FOLATE, FERRITIN, TIBC, IRON, RETICCTPCT in the last 72 hours. Sepsis Labs: Recent Labs  Lab 12/07/19 2257  LATICACIDVEN 1.1    Recent Results (from the past 240 hour(s))  Urine culture     Status: Abnormal   Collection Time: 12/07/19  7:14 PM   Specimen: Urine, Clean Catch  Result Value Ref Range Status   Specimen Description   Final    URINE, CLEAN CATCH Performed at [facility], [place] [address] [place], [place] [address]    Special Requests   Final    NONE Performed at [facility], [place] [address] [place], [place] [address]    Culture MULTIPLE SPECIES PRESENT, SUGGEST RECOLLECTION (A)  Final   Report Status 12/08/2019 FINAL  Final  SARS CORONAVIRUS 2 (TAT 6-24 HRS) Nasopharyngeal Nasopharyngeal Swab     Status: None   Collection Time: 12/07/19 10:33 PM   Specimen: Nasopharyngeal Swab   Result Value Ref Range Status   SARS Coronavirus 2 NEGATIVE NEGATIVE Final    Comment: (NOTE) SARS-CoV-2 target nucleic acids are NOT DETECTED. The SARS-CoV-2 RNA is generally detectable in upper and lower respiratory specimens during the acute phase of infection. Negative results do not preclude SARS-CoV-2 infection, do not rule out co-infections with other pathogens, and should not be used as the sole basis for treatment or other patient management decisions. Negative results must be combined with clinical observations, patient history, and epidemiological information. The expected result is Negative. Fact Sheet for Patients: SugarRoll.be Fact Sheet for Healthcare Providers: https://www.woods-mathews.com/ This test is not yet approved or cleared by the [place] FDA and  has been authorized for detection and/or diagnosis of SARS-CoV-2 by FDA under an Emergency Use Authorization (EUA). This EUA will remain  in effect (meaning this test can be used) for the duration of the COVID-19 declaration under Section 56 4(b)(1) of the Act, 21 U.S.C. section 360bbb-3(b)(1), unless the authorization is terminated or revoked sooner. Performed at [facility], [place] [address] [place], [place] [address]   MRSA PCR Screening     Status: None   Collection Time: 12/08/19 11:32 PM   Specimen: Nasal Mucosa; Nasopharyngeal  Result Value Ref Range Status   MRSA by PCR NEGATIVE NEGATIVE Final    Comment:        The GeneXpert MRSA Assay (FDA approved for NASAL specimens only), is one component of a comprehensive MRSA colonization surveillance program. It is not intended to diagnose MRSA infection nor to guide or monitor treatment for MRSA infections. Performed at [facility], [place] [address] [place], [place] [address]          Radiology Studies: MR ABDOMEN MRCP WO CONTRAST  Result Date: 12/08/2019 CLINICAL DATA:   Pancreatitis suspected, history of CBD stones by report. EXAM: MRI ABDOMEN WITHOUT CONTRAST  (INCLUDING MRCP) TECHNIQUE: Multiplanar multisequence MR imaging of the abdomen was performed. Heavily T2-weighted images of the biliary and pancreatic ducts were obtained, and three-dimensional MRCP images were rendered by post processing. COMPARISON:  Abdominal sonogram of 12/08/2019 and abdominal CT 12/07/2019. FINDINGS: Lower chest: Basilar atelectasis and signs of trace pleural fluid bilaterally. Lung bases not well assessed on MRI. Hepatobiliary:  Liver is normal. Biliary tree mildly dilated with tapered narrowing showing some irregularity in the area of the pancreatic head in the setting of active pancreatic inflammation. No signs of visible filling defect though there was some gas at the ampulla on the CT evaluation of 12/07/2019. Pancreas: Signs of acute interstitial pancreatitis with diffuse anterior pararenal stranding and inflammation, inflammation extending into the root of the small bowel mesentery. Spleen:  Spleen is unremarkable. Adrenals/Urinary Tract:  Adrenal glands are normal. Signs of left nephroureteral stent with patulous collecting systems with slight increase in fullness accounting for differences in technique. Gas in the collecting systems of the right kidney with cortical scarring on the right though not as severe as on the left. Stomach/Bowel: No signs of acute gastrointestinal process. There is abundant gas in the colon that leads to susceptibility artifact on many sequences. Bowel is incompletely imaged on today's study. Vascular/Lymphatic: No signs of retroperitoneal lymphadenopathy. Numerous small lymph nodes likely reactive in the setting of acute inflammation. Other: Extensive inflammation in the anterior pararenal space. Small volume ascites. No discrete, focal fluid collection. Musculoskeletal: No acute bone finding or destructive bone process. IMPRESSION: 1. Signs of acute interstitial  pancreatitis with diffuse anterior pararenal stranding, non loculated fluid and inflammation extending also into the root of the small bowel mesentery. 2. Mild intra and extrahepatic biliary ductal dilatation with tapered narrowing in the area of the pancreatic head in the setting of active pancreatic inflammation. No evidence of choledocholithiasis. 3. Gas in the collecting systems of the right kidney with cortical scarring on the right though not as severe as on the left. Slight increase in fullness of left intrarenal collecting systems is suggested since the previous exam though comparison on different modalities is challenging, attention on follow-up. 4. Basilar atelectasis and trace pleural fluid bilaterally. 5. Study limited due to technical factors with gross patient motion on some images. Electronically Signed   By: Zetta Bills M.D.   On: 12/08/2019 14:14   MR 3D Recon At Scanner  Result Date: 12/08/2019 CLINICAL DATA:  Pancreatitis suspected, history of CBD stones by report. EXAM: MRI ABDOMEN WITHOUT CONTRAST  (INCLUDING MRCP) TECHNIQUE: Multiplanar multisequence MR imaging of the abdomen was performed. Heavily T2-weighted images of the biliary and pancreatic ducts were obtained, and three-dimensional MRCP images were rendered by post processing. COMPARISON:  Abdominal sonogram of 12/08/2019 and abdominal CT 12/07/2019. FINDINGS: Lower chest: Basilar atelectasis and signs of trace pleural fluid bilaterally. Lung bases not well assessed on MRI. Hepatobiliary: Liver is normal. Biliary tree mildly dilated with tapered narrowing showing some irregularity in the area of the pancreatic head in the setting of active pancreatic inflammation. No signs of visible filling defect though there was some gas at the ampulla on the CT evaluation of 12/07/2019. Pancreas: Signs of acute interstitial pancreatitis with diffuse anterior pararenal stranding and inflammation, inflammation extending into the root of the  small bowel mesentery. Spleen:  Spleen is unremarkable. Adrenals/Urinary Tract:  Adrenal glands are normal. Signs of left nephroureteral stent with patulous collecting systems with slight increase in fullness accounting for differences in technique. Gas in the collecting systems of the right kidney with cortical scarring on the right though not as severe as on the left. Stomach/Bowel: No signs of acute gastrointestinal process. There is abundant gas in the colon that leads to susceptibility artifact on many sequences. Bowel is incompletely imaged on today's study. Vascular/Lymphatic: No signs of retroperitoneal lymphadenopathy. Numerous small lymph nodes likely reactive in the setting of acute inflammation.  Other: Extensive inflammation in the anterior pararenal space. Small volume ascites. No discrete, focal fluid collection. Musculoskeletal: No acute bone finding or destructive bone process. IMPRESSION: 1. Signs of acute interstitial pancreatitis with diffuse anterior pararenal stranding, non loculated fluid and inflammation extending also into the root of the small bowel mesentery. 2. Mild intra and extrahepatic biliary ductal dilatation with tapered narrowing in the area of the pancreatic head in the setting of active pancreatic inflammation. No evidence of choledocholithiasis. 3. Gas in the collecting systems of the right kidney with cortical scarring on the right though not as severe as on the left. Slight increase in fullness of left intrarenal collecting systems is suggested since the previous exam though comparison on different modalities is challenging, attention on follow-up. 4. Basilar atelectasis and trace pleural fluid bilaterally. 5. Study limited due to technical factors with gross patient motion on some images. Electronically Signed   By: Zetta Bills M.D.   On: 12/08/2019 14:14        Scheduled Meds: . carvedilol  6.25 mg Oral BID  . Chlorhexidine Gluconate Cloth  6 each Topical Daily   . dextrose  12.5 g Intravenous STAT  . docusate sodium  100 mg Oral Daily  . donepezil  10 mg Oral QHS  . hydrALAZINE  50 mg Oral TID  . isosorbide mononitrate  30 mg Oral Daily  . levETIRAcetam  500 mg Oral BID  . mouth rinse  15 mL Mouth Rinse BID  . Melatonin  3 mg Oral QHS  . phenytoin  300 mg Oral QHS  . potassium chloride  40 mEq Oral BID  . scopolamine  1 patch Transdermal Q72H  . sodium bicarbonate  650 mg Oral TID   Continuous Infusions: . lactated ringers 100 mL/hr at 12/10/19 0300  . meropenem (MERREM) IV Stopped (12/09/19 2213)     LOS: 3 days    Time spent: 40 min    Hosie Poisson, MD [facility] 12/10/2019, 8:09 AM

## 2019-12-11 LAB — CBC WITH DIFFERENTIAL/PLATELET
Abs Immature Granulocytes: 0.03 10*3/uL (ref 0.00–0.07)
Basophils Absolute: 0 10*3/uL (ref 0.0–0.1)
Basophils Relative: 0 %
Eosinophils Absolute: 0.2 10*3/uL (ref 0.0–0.5)
Eosinophils Relative: 2 %
HCT: 27.3 % — ABNORMAL LOW (ref 36.0–46.0)
Hemoglobin: 8.4 g/dL — ABNORMAL LOW (ref 12.0–15.0)
Immature Granulocytes: 0 %
Lymphocytes Relative: 28 %
Lymphs Abs: 2.3 10*3/uL (ref 0.7–4.0)
MCH: 29.7 pg (ref 26.0–34.0)
MCHC: 30.8 g/dL (ref 30.0–36.0)
MCV: 96.5 fL (ref 80.0–100.0)
Monocytes Absolute: 0.8 10*3/uL (ref 0.1–1.0)
Monocytes Relative: 9 %
Neutro Abs: 5 10*3/uL (ref 1.7–7.7)
Neutrophils Relative %: 61 %
Platelets: 88 10*3/uL — ABNORMAL LOW (ref 150–400)
RBC: 2.83 MIL/uL — ABNORMAL LOW (ref 3.87–5.11)
RDW: 15.8 % — ABNORMAL HIGH (ref 11.5–15.5)
WBC: 8.3 10*3/uL (ref 4.0–10.5)
nRBC: 0 % (ref 0.0–0.2)

## 2019-12-11 LAB — GLUCOSE, CAPILLARY
Glucose-Capillary: 90 mg/dL (ref 70–99)
Glucose-Capillary: 90 mg/dL (ref 70–99)
Glucose-Capillary: 91 mg/dL (ref 70–99)
Glucose-Capillary: 94 mg/dL (ref 70–99)
Glucose-Capillary: 87 mg/dL (ref 70–99)

## 2019-12-11 LAB — COMPREHENSIVE METABOLIC PANEL
ALT: 7 U/L (ref 0–44)
AST: 13 U/L — ABNORMAL LOW (ref 15–41)
Albumin: 1.8 g/dL — ABNORMAL LOW (ref 3.5–5.0)
Alkaline Phosphatase: 110 U/L (ref 38–126)
Anion gap: 6 (ref 5–15)
BUN: 18 mg/dL (ref 8–23)
CO2: 26 mmol/L (ref 22–32)
Calcium: 7.3 mg/dL — ABNORMAL LOW (ref 8.9–10.3)
Chloride: 107 mmol/L (ref 98–111)
Creatinine, Ser: 1.13 mg/dL — ABNORMAL HIGH (ref 0.44–1.00)
GFR calc Af Amer: 58 mL/min — ABNORMAL LOW (ref >60)
GFR calc non Af Amer: 50 mL/min — ABNORMAL LOW (ref >60)
Glucose, Bld: 90 mg/dL (ref 70–99)
Potassium: 4.6 mmol/L (ref 3.5–5.1)
Sodium: 139 mmol/L (ref 135–145)
Total Bilirubin: 0.5 mg/dL (ref 0.3–1.2)
Total Protein: 4.4 g/dL — ABNORMAL LOW (ref 6.5–8.1)

## 2019-12-11 LAB — LIPASE, BLOOD: Lipase: 106 U/L — ABNORMAL HIGH (ref 11–51)

## 2019-12-11 NOTE — Progress Notes (Signed)
PROGRESS NOTE    [name]  [id number] DOB: 05-15-1951 DOA: 12/07/2019 PCP: Jeanie Sewer, NP   Brief Narrative:   69 year old with prior history of Alzheimer's dementia, right hemicolectomy.  Complicated by blind loop syndrome, small bowel bacterial overgrowth, stage III CKD, COPD, anemia, hypertension, CVA, PAD referral vascular disease, hyperlipidemia, history of MDR E. coli, VRE, history of seizures presents to ED for evaluation of abdominal pain.  She was found to have acute pancreatitis with elevated lipase levels.  Gastroenterology was consulted she underwent an MRCP that demonstrated acute pancreatitis without any biliary stones.  She was started on clear liquid diet as her abdominal pain  was improving and her lipase levels were improving.  Assessment & Plan:   Principal Problem:   Acute pancreatitis Active Problems:   AKI (acute kidney injury) ([place])   Catheter-associated urinary tract infection (HCC)   Hyperkalemia   QT prolongation   Severe acute pancreatitis She underwent MRCP on admission showing acute pancreatitis without any biliary stones, some narrowing of ducts in the area of the pancreatic head secondary to acute inflammation/compression.  She was started on IV antibiotics empirically.  There are no necrosis seen at this time.  She was also started on IV fluids and clear liquid diet as her pain and her symptoms are improving.  On exam today, she reports tolerating full liquid diet, has some abdominal tenderness.  Recommend to continue with IV fluids and full liquid diet for another day before we advance to soft diet.  Her lipase levels have improved but not normalized.    Catheter associated urinary tract infection Patient has an indwelling Foley catheter  placed by urology.  She has a history of enterocystoplasty/bladder augmentation requiring an indwelling stent secondary to ureteral stricture.  CT abdomen and pelvis showed left ureter stent in place and  decreased hydronephrosis of the left kidney when compared to the prior study.  Urine analysis was abnormal on admission and urine cultures show multiple bacteria the sample from 12/07/2019.  Repeat culture show the same, recommend to stop after 5 days of IV antibiotics.    Hypertension:  Well controlled, resume coreg, hydralazine and imdur. .    Dementia:  No agitation overnight, resume Aricept.    H/o seizures:  No seizures overnight , resume keppra and dilantin.    Hypokalemia; replaced and repeat level wnl.   AKI;  Probably from dehydration;  Improving with IV fluids. Creatinine has improved to 1.    Anemia of chronic disease;  Baseline hemoglobin appears to be between 8 t 9.  She was admitted with a hemoglobin of 12 which is probably a hemoconcentrated sample. Hemoglobin is stable around 8.    Mild leukocytosis suspect from pancreatitis. Resolved.   Prolonged QTC; Probably from electrolyte abnormalities.  Replace potassium and magnesium keep K>4 and Magnesium >2.  Repeat EKG today.   Thrombocytopenia:  Appears to be chronic, she had platelet coutn of 98,000 in 03/2019.  Platelet count at 88,000 today. Unclear etiology.  No bleeding seen.     DVT prophylaxis: SCD'S Code Status: DNR.  Family Communication: none at bedside. Discussed with family over the phone.  Disposition Plan: Pending clinical improvement and pending PT evaluation.   Consultants:   Gastroenterology.   Procedures: MRCP.   Antimicrobials Anti-infectives (From admission, onward)   Start     Dose/Rate Route Frequency Ordered Stop   12/10/19 1400  meropenem (MERREM) 1 g in sodium chloride 0.9 % 100 mL IVPB  1 g 200 mL/hr over 30 Minutes Intravenous Every 12 hours 12/10/19 1222 12/12/19 0159   12/08/19 0015  meropenem (MERREM) 1 g in sodium chloride 0.9 % 100 mL IVPB  Status:  Discontinued     1 g 200 mL/hr over 30 Minutes Intravenous Every 12 hours 12/08/19 0009 12/10/19 1222   12/07/19  2200  cefTRIAXone (ROCEPHIN) 1 g in sodium chloride 0.9 % 100 mL IVPB  Status:  Discontinued     1 g 200 mL/hr over 30 Minutes Intravenous  Once 12/07/19 2150 12/07/19 2330      Subjective: abd soreness present, but improving. No nausea or vomiting today.  No chest pain or sob.    Objective:  Vitals:   12/10/19 1100 12/10/19 1200 12/10/19 1351 12/10/19 2208  BP: (!) 161/79  (!) 128/55 (!) 120/57  Pulse: 89 87 63 94  Resp: 12 18 18 16   Temp:  98.3 F (36.8 C) 98.8 F (37.1 C) 98.9 F (37.2 C)  TempSrc:  Oral Oral Oral  SpO2: 98% 99% 94% 97%  Weight:      Height:        Intake/Output Summary (Last 24 hours) at 12/11/2019 1213 Last data filed at 12/11/2019 [id number] Gross per 24 hour  Intake 1829.66 ml  Output 750 ml  Net 1079.66 ml   Filed Weights   12/07/19 2051  Weight: 37.2 kg    Examination:  General exam: calm and comfortable.  Respiratory system: Clear to auscultation bilaterally, no wheezing or rhonchi Cardiovascular system: S1-S2 heard, regular rate rhythm, no pedal edema Gastrointestinal system: Abdomen is soft, mild tenderness in the epigastric area nondistended, bowel sounds are within normal limits Central nervous system: Answering questions appropriately, able to move all extremities, grossly nonfocal Extremities: No pedal edema, cyanosis or clubbing Skin: No rashes Psychiatry: .  Mood is appropriate   Data Reviewed: I have personally reviewed following labs and imaging studies  CBC: Recent Labs  Lab 12/07/19 2030 12/08/19 0500 12/09/19 0219 12/10/19 0219 12/11/19 0843  WBC 15.3* 14.8* 14.0* 11.0* 8.3  NEUTROABS 12.7* 12.2*  --   --  5.0  HGB 12.1 12.3 10.9* 8.8* 8.4*  HCT 39.2 40.0 35.3* 27.8* 27.3*  MCV 97.0 95.9 94.4 94.2 96.5  PLT 242 172 98* 92* 88*   Basic Metabolic Panel: Recent Labs  Lab 12/07/19 2309 12/07/19 2320 12/08/19 0500 12/09/19 0219 12/10/19 0219 12/11/19 0843  NA 135  --  141 144 136 139  K 4.4  --  4.0 3.5 3.0* 4.6    CL 109  --  114* 113* 100 107  CO2 15*  --  17* 21* 26 26  GLUCOSE 188*  --  108* 103* 93 90  BUN 26*  --  21 22 20 18   CREATININE 1.51*  --  1.36* 1.20* 1.05* 1.13*  CALCIUM 8.4*  --  8.2* 7.8* 6.9* 7.3*  MG  --  2.0  --   --   --   --    GFR: Estimated Creatinine Clearance: 28 mL/min (A) (by C-G formula based on SCr of 1.13 mg/dL (H)). Liver Function Tests: Recent Labs  Lab 12/07/19 1913 12/08/19 0500 12/09/19 0219 12/10/19 0219 12/11/19 0843  AST 21 13* 15 15 13*  ALT 7 6 7 6 7   ALKPHOS 210* 193* 155* 116 110  BILITOT 1.2 0.6 0.9 0.7 0.5  PROT 7.5 6.5 5.8* 4.6* 4.4*  ALBUMIN 3.2* 2.8* 2.5* 2.0* 1.8*   Recent Labs  Lab 12/07/19 1913 12/08/19 0500  12/09/19 0219 12/10/19 0219 12/11/19 0843  LIPASE 1,682* 927* 521* 154* 106*   No results for input(s): AMMONIA in the last 168 hours. Coagulation Profile: No results for input(s): INR, PROTIME in the last 168 hours. Cardiac Enzymes: No results for input(s): CKTOTAL, CKMB, CKMBINDEX, TROPONINI in the last 168 hours. BNP (last 3 results) No results for input(s): PROBNP in the last 8760 hours. HbA1C: No results for input(s): HGBA1C in the last 72 hours. CBG: Recent Labs  Lab 12/10/19 1551 12/10/19 2354 12/11/19 0440 12/11/19 0735 12/11/19 1140  GLUCAP 84 93 90 90 91   Lipid Profile: No results for input(s): CHOL, HDL, LDLCALC, TRIG, CHOLHDL, LDLDIRECT in the last 72 hours. Thyroid Function Tests: No results for input(s): TSH, T4TOTAL, FREET4, T3FREE, THYROIDAB in the last 72 hours. Anemia Panel: No results for input(s): VITAMINB12, FOLATE, FERRITIN, TIBC, IRON, RETICCTPCT in the last 72 hours. Sepsis Labs: Recent Labs  Lab 12/07/19 2257  LATICACIDVEN 1.1    Recent Results (from the past 240 hour(s))  Urine culture     Status: Abnormal   Collection Time: 12/07/19  7:14 PM   Specimen: Urine, Clean Catch  Result Value Ref Range Status   Specimen Description   Final    URINE, CLEAN CATCH Performed at  [facility], [place] [address], [place], [place] [address]    Special Requests   Final    NONE Performed at [facility], [place] [address] [place], [place] [address]    Culture MULTIPLE SPECIES PRESENT, SUGGEST RECOLLECTION (A)  Final   Report Status 12/08/2019 FINAL  Final  SARS CORONAVIRUS 2 (TAT 6-24 HRS) Nasopharyngeal Nasopharyngeal Swab     Status: None   Collection Time: 12/07/19 10:33 PM   Specimen: Nasopharyngeal Swab  Result Value Ref Range Status   SARS Coronavirus 2 NEGATIVE NEGATIVE Final    Comment: (NOTE) SARS-CoV-2 target nucleic acids are NOT DETECTED. The SARS-CoV-2 RNA is generally detectable in upper and lower respiratory specimens during the acute phase of infection. Negative results do not preclude SARS-CoV-2 infection, do not rule out co-infections with other pathogens, and should not be used as the sole basis for treatment or other patient management decisions. Negative results must be combined with clinical observations, patient history, and epidemiological information. The expected result is Negative. Fact Sheet for Patients: SugarRoll.be Fact Sheet for Healthcare Providers: https://www.woods-mathews.com/ This test is not yet approved or cleared by the [place] FDA and  has been authorized for detection and/or diagnosis of SARS-CoV-2 by FDA under an Emergency Use Authorization (EUA). This EUA will remain  in effect (meaning this test can be used) for the duration of the COVID-19 declaration under Section 56 4(b)(1) of the Act, 21 U.S.C. section 360bbb-3(b)(1), unless the authorization is terminated or revoked sooner. Performed at [facility], [place] [address], [place], [place] [address]   MRSA PCR Screening     Status: None   Collection Time: 12/08/19 11:32 PM   Specimen: Nasal Mucosa; Nasopharyngeal  Result Value Ref Range Status   MRSA by PCR NEGATIVE NEGATIVE  Final    Comment:        The GeneXpert MRSA Assay (FDA approved for NASAL specimens only), is one component of a comprehensive MRSA colonization surveillance program. It is not intended to diagnose MRSA infection nor to guide or monitor treatment for MRSA infections. Performed at [facility], [place] [address], [place], [place] [address]   Culture, Urine     Status:  Abnormal   Collection Time: 12/09/19  5:26 PM   Specimen: In/Out Cath Urine  Result Value Ref Range Status   Specimen Description   Final    IN/OUT CATH URINE Performed at [facility], [place] [address], [place], [place] [address]    Special Requests   Final    NONE Performed at [facility], [place] [address], [place], [place] [address]    Culture MULTIPLE SPECIES PRESENT, SUGGEST RECOLLECTION (A)  Final   Report Status 12/10/2019 FINAL  Final         Radiology Studies: No results found.      Scheduled Meds: . carvedilol  6.25 mg Oral BID  . Chlorhexidine Gluconate Cloth  6 each Topical Daily  . docusate sodium  100 mg Oral Daily  . donepezil  10 mg Oral QHS  . hydrALAZINE  50 mg Oral TID  . isosorbide mononitrate  30 mg Oral Daily  . levETIRAcetam  500 mg Oral BID  . mouth rinse  15 mL Mouth Rinse BID  . Melatonin  3 mg Oral QHS  . phenytoin  300 mg Oral QHS  . scopolamine  1 patch Transdermal Q72H  . sodium bicarbonate  650 mg Oral TID   Continuous Infusions: . lactated ringers 100 mL/hr at 12/11/19 0613  . meropenem (MERREM) IV Stopped (12/11/19 0151)     LOS: 4 days        Hosie Poisson, MD [facility] 12/11/2019, 12:13 PM

## 2019-12-11 NOTE — Progress Notes (Signed)
Pharmacy Antibiotic Note  [name] is a 69 y.o. female admitted on 12/07/2019 with Intra-abdominal infection +/- UTI. Pharmacy has been consulted for Meropenem dosing.  Today, 12/11/19  Afebrile.  WBC improved  SCr improving - CrCl 30  Plan: Continue Meropenem 1g IV q12hr  Height: 5' (152.4 cm) Weight: 82 lb (37.2 kg) IBW/kg (Calculated) : 45.5  Temp (24hrs), Avg:98.5 F (36.9 C), Min:98.1 F (36.7 C), Max:98.9 F (37.2 C)  Recent Labs  Lab 12/07/19 1913 12/07/19 2030 12/07/19 2257 12/07/19 2309 12/08/19 0500 12/09/19 0219 12/10/19 0219  WBC  --  15.3*  --   --  14.8* 14.0* 11.0*  CREATININE 1.62*  --   --  1.51* 1.36* 1.20* 1.05*  LATICACIDVEN  --   --  1.1  --   --   --   --     Estimated Creatinine Clearance: 30.1 mL/min (A) (by C-G formula based on SCr of 1.05 mg/dL (H)).    Allergies  Allergen Reactions  . Codeine Nausea And Vomiting    REACTION: intolerance  . Ace Inhibitors   . Latex Other (See Comments)    itching    Thank you for allowing pharmacy to be a part of this patient's care.  Kara Mead 12/11/2019 7:22 AM

## 2019-12-12 LAB — GLUCOSE, CAPILLARY
Glucose-Capillary: 103 mg/dL — ABNORMAL HIGH (ref 70–99)
Glucose-Capillary: 77 mg/dL (ref 70–99)
Glucose-Capillary: 80 mg/dL (ref 70–99)
Glucose-Capillary: 85 mg/dL (ref 70–99)
Glucose-Capillary: 87 mg/dL (ref 70–99)
Glucose-Capillary: 89 mg/dL (ref 70–99)

## 2019-12-12 LAB — BASIC METABOLIC PANEL
Anion gap: 7 (ref 5–15)
BUN: 14 mg/dL (ref 8–23)
CO2: 25 mmol/L (ref 22–32)
Calcium: 7.4 mg/dL — ABNORMAL LOW (ref 8.9–10.3)
Chloride: 105 mmol/L (ref 98–111)
Creatinine, Ser: 1.09 mg/dL — ABNORMAL HIGH (ref 0.44–1.00)
GFR calc Af Amer: >60 mL/min (ref >60)
GFR calc non Af Amer: 52 mL/min — ABNORMAL LOW (ref >60)
Glucose, Bld: 93 mg/dL (ref 70–99)
Potassium: 4.6 mmol/L (ref 3.5–5.1)
Sodium: 137 mmol/L (ref 135–145)

## 2019-12-12 LAB — LIPASE, BLOOD: Lipase: 75 U/L — ABNORMAL HIGH (ref 11–51)

## 2019-12-12 NOTE — Progress Notes (Signed)
PROGRESS NOTE    [name]  [id number] DOB: Apr 25, 1951 DOA: 12/07/2019 PCP: Jeanie Sewer, NP   Brief Narrative:   69 year old with prior history of Alzheimer's dementia, right hemicolectomy.  Complicated by blind loop syndrome, small bowel bacterial overgrowth, stage III CKD, COPD, anemia, hypertension, CVA, PAD referral vascular disease, hyperlipidemia, history of MDR E. coli, VRE, history of seizures presents to ED for evaluation of abdominal pain.  She was found to have acute pancreatitis with elevated lipase levels.  Gastroenterology was consulted she underwent an MRCP that demonstrated acute pancreatitis without any biliary stones.  She was started on clear liquid diet and advance to soft diet today.  On examination this afternoon patient reports worsening abdominal pain.  Her soft diet was later downgraded to full liquid diet due to worsening abdominal pain. Assessment & Plan:   Principal Problem:   Acute pancreatitis Active Problems:   AKI (acute kidney injury) ([place])   Catheter-associated urinary tract infection (HCC)   Hyperkalemia   QT prolongation   Severe acute pancreatitis She underwent MRCP on admission showing acute pancreatitis without any biliary stones, some narrowing of ducts in the area of the pancreatic head secondary to acute inflammation/compression.  She was started on IV antibiotics empirically.  There are no necrosis seen at this time.  her lipase levels are improving but have not normalized.  Patient on soft diet today but could not tolerate it and had recurrent abdominal pain in her diet was downgraded to full liquid diet.  Catheter associated urinary tract infection Patient has an indwelling Foley catheter  placed by urology.  She has a history of enterocystoplasty/bladder augmentation requiring an indwelling stent secondary to ureteral stricture.  CT abdomen and pelvis showed left ureter stent in place and decreased hydronephrosis of the left kidney  when compared to the prior study.  Urine analysis was abnormal on admission and urine cultures show multiple bacteria the sample from 12/07/2019.  Completed 5 days of IV meropenem.  Hypertension:  Well-controlled continue with Coreg, hydralazine, Imdur   Dementia:  Resume Aricept   H/o seizures:  Resume Keppra and Dilantin, no seizures overnight   Hypokalemia; replaced Repeat within normal limits.  AKI;  Probably secondary to dehydration, much improved with fluids.  Creatinine around 1.   Anemia of chronic disease;  Baseline hemoglobin appears to be between 8-9 She was admitted with a hemoglobin of 12 which is probably a hemoconcentrated sample. Hemoglobin is stable around 8.    Mild leukocytosis suspect from pancreatitis.  Resolved  Prolonged QTC; Probably from electrolyte abnormalities.  Replace potassium and magnesium keep K>4 and Magnesium >2.  Repeat EKG showed improved QTC to 475.  Thrombocytopenia:  Appears to be chronic, she had platelet coutn of 98,000 in 03/2019.  Platelet count at 88,000.  No bleeding seen.     DVT prophylaxis: SCD'S Code Status: DNR.  Family Communication: none at bedside. Discussed with family over the phone.  Disposition Plan: Pending clinical improvement and pending PT evaluation.   Consultants:   Gastroenterology.   Procedures: MRCP.   Antimicrobials Anti-infectives (From admission, onward)   Start     Dose/Rate Route Frequency Ordered Stop   12/10/19 1400  meropenem (MERREM) 1 g in sodium chloride 0.9 % 100 mL IVPB     1 g 200 mL/hr over 30 Minutes Intravenous Every 12 hours 12/10/19 1222 12/11/19 1348   12/08/19 0015  meropenem (MERREM) 1 g in sodium chloride 0.9 % 100 mL IVPB  Status:  Discontinued  1 g 200 mL/hr over 30 Minutes Intravenous Every 12 hours 12/08/19 0009 12/10/19 1222   12/07/19 2200  cefTRIAXone (ROCEPHIN) 1 g in sodium chloride 0.9 % 100 mL IVPB  Status:  Discontinued     1 g 200 mL/hr over 30  Minutes Intravenous  Once 12/07/19 2150 12/07/19 2330      Subjective: She has morning with abdominal pain today patient denies any nausea or vomiting.  She denies any chest pain or shortness of breath.  Objective:  Vitals:   12/11/19 1334 12/11/19 2101 12/12/19 0432 12/12/19 1413  BP: (!) 126/53 115/63 (!) 130/55 (!) 148/78  Pulse: 76 84 82 86  Resp: 17 16 16 17   Temp: 98.5 F (36.9 C) 99.3 F (37.4 C) 98.5 F (36.9 C) 98.8 F (37.1 C)  TempSrc: Oral Oral Oral Oral  SpO2: 96% 93% 98% 96%  Weight:      Height:        Intake/Output Summary (Last 24 hours) at 12/12/2019 1544 Last data filed at 12/12/2019 [id number] Gross per 24 hour  Intake 240 ml  Output 1350 ml  Net -1110 ml   Filed Weights   12/07/19 2051  Weight: 37.2 kg    Examination:  General exam: Mild to moderate distress from abdominal pain Respiratory system: Clear to auscultation bilaterally, no wheezing or rhonchi Cardiovascular system: S1-S2 heard, regular rate rhythm, no pedal edema Gastrointestinal system: Abdomen is soft, mild to moderate tenderness in the epigastric area, bowel sounds within normal limits. Central nervous system: Alert and able to answer questions appropriately and following commands. Extremities: No pedal edema, cyanosis or clubbing Skin: No rashes seen Psychiatry: .  Mood appropriate  Data Reviewed: I have personally reviewed following labs and imaging studies  CBC: Recent Labs  Lab 12/07/19 2030 12/08/19 0500 12/09/19 0219 12/10/19 0219 12/11/19 0843  WBC 15.3* 14.8* 14.0* 11.0* 8.3  NEUTROABS 12.7* 12.2*  --   --  5.0  HGB 12.1 12.3 10.9* 8.8* 8.4*  HCT 39.2 40.0 35.3* 27.8* 27.3*  MCV 97.0 95.9 94.4 94.2 96.5  PLT 242 172 98* 92* 88*   Basic Metabolic Panel: Recent Labs  Lab 12/07/19 2320 12/08/19 0500 12/09/19 0219 12/10/19 0219 12/11/19 0843 12/12/19 1021  NA  --  141 144 136 139 137  K  --  4.0 3.5 3.0* 4.6 4.6  CL  --  114* 113* 100 107 105  CO2  --  17* 21*  26 26 25   GLUCOSE  --  108* 103* 93 90 93  BUN  --  21 22 20 18 14   CREATININE  --  1.36* 1.20* 1.05* 1.13* 1.09*  CALCIUM  --  8.2* 7.8* 6.9* 7.3* 7.4*  MG 2.0  --   --   --   --   --    GFR: Estimated Creatinine Clearance: 29 mL/min (A) (by C-G formula based on SCr of 1.09 mg/dL (H)). Liver Function Tests: Recent Labs  Lab 12/07/19 1913 12/08/19 0500 12/09/19 0219 12/10/19 0219 12/11/19 0843  AST 21 13* 15 15 13*  ALT 7 6 7 6 7   ALKPHOS 210* 193* 155* 116 110  BILITOT 1.2 0.6 0.9 0.7 0.5  PROT 7.5 6.5 5.8* 4.6* 4.4*  ALBUMIN 3.2* 2.8* 2.5* 2.0* 1.8*   Recent Labs  Lab 12/08/19 0500 12/09/19 0219 12/10/19 0219 12/11/19 0843 12/12/19 1021  LIPASE 927* 521* 154* 106* 75*   No results for input(s): AMMONIA in the last 168 hours. Coagulation Profile: No results for input(s):  INR, PROTIME in the last 168 hours. Cardiac Enzymes: No results for input(s): CKTOTAL, CKMB, CKMBINDEX, TROPONINI in the last 168 hours. BNP (last 3 results) No results for input(s): PROBNP in the last 8760 hours. HbA1C: No results for input(s): HGBA1C in the last 72 hours. CBG: Recent Labs  Lab 12/11/19 1611 12/11/19 2058 12/12/19 0435 12/12/19 0753 12/12/19 1155  GLUCAP 94 87 89 77 85   Lipid Profile: No results for input(s): CHOL, HDL, LDLCALC, TRIG, CHOLHDL, LDLDIRECT in the last 72 hours. Thyroid Function Tests: No results for input(s): TSH, T4TOTAL, FREET4, T3FREE, THYROIDAB in the last 72 hours. Anemia Panel: No results for input(s): VITAMINB12, FOLATE, FERRITIN, TIBC, IRON, RETICCTPCT in the last 72 hours. Sepsis Labs: Recent Labs  Lab 12/07/19 2257  LATICACIDVEN 1.1    Recent Results (from the past 240 hour(s))  Urine culture     Status: Abnormal   Collection Time: 12/07/19  7:14 PM   Specimen: Urine, Clean Catch  Result Value Ref Range Status   Specimen Description   Final    URINE, CLEAN CATCH Performed at [facility], [place] [address],  [place], [place] [address]    Special Requests   Final    NONE Performed at [facility], [place] [address], [place], [place] [address]    Culture MULTIPLE SPECIES PRESENT, SUGGEST RECOLLECTION (A)  Final   Report Status 12/08/2019 FINAL  Final  SARS CORONAVIRUS 2 (TAT 6-24 HRS) Nasopharyngeal Nasopharyngeal Swab     Status: None   Collection Time: 12/07/19 10:33 PM   Specimen: Nasopharyngeal Swab  Result Value Ref Range Status   SARS Coronavirus 2 NEGATIVE NEGATIVE Final    Comment: (NOTE) SARS-CoV-2 target nucleic acids are NOT DETECTED. The SARS-CoV-2 RNA is generally detectable in upper and lower respiratory specimens during the acute phase of infection. Negative results do not preclude SARS-CoV-2 infection, do not rule out co-infections with other pathogens, and should not be used as the sole basis for treatment or other patient management decisions. Negative results must be combined with clinical observations, patient history, and epidemiological information. The expected result is Negative. Fact Sheet for Patients: SugarRoll.be Fact Sheet for Healthcare Providers: https://www.woods-mathews.com/ This test is not yet approved or cleared by the [place] FDA and  has been authorized for detection and/or diagnosis of SARS-CoV-2 by FDA under an Emergency Use Authorization (EUA). This EUA will remain  in effect (meaning this test can be used) for the duration of the COVID-19 declaration under Section 56 4(b)(1) of the Act, 21 U.S.C. section 360bbb-3(b)(1), unless the authorization is terminated or revoked sooner. Performed at [facility], [place] [address] [place], [place] [address]   MRSA PCR Screening     Status: None   Collection Time: 12/08/19 11:32 PM   Specimen: Nasal Mucosa; Nasopharyngeal  Result Value Ref Range Status   MRSA by PCR NEGATIVE NEGATIVE Final    Comment:        The GeneXpert MRSA Assay (FDA  approved for NASAL specimens only), is one component of a comprehensive MRSA colonization surveillance program. It is not intended to diagnose MRSA infection nor to guide or monitor treatment for MRSA infections. Performed at [facility], [place] [address] [place], [place] [address]   Culture, Urine     Status: Abnormal   Collection Time: 12/09/19  5:26 PM   Specimen: In/Out Cath Urine  Result Value Ref Range Status   Specimen Description   Final  IN/OUT CATH URINE Performed at [facility], [place] [address] [place], [place] [address]    Special Requests   Final    NONE Performed at [facility], [place] [address], [place], [place] [address]    Culture MULTIPLE SPECIES PRESENT, SUGGEST RECOLLECTION (A)  Final   Report Status 12/10/2019 FINAL  Final         Radiology Studies: No results found.      Scheduled Meds: . carvedilol  6.25 mg Oral BID  . Chlorhexidine Gluconate Cloth  6 each Topical Daily  . docusate sodium  100 mg Oral Daily  . donepezil  10 mg Oral QHS  . hydrALAZINE  50 mg Oral TID  . isosorbide mononitrate  30 mg Oral Daily  . levETIRAcetam  500 mg Oral BID  . mouth rinse  15 mL Mouth Rinse BID  . Melatonin  3 mg Oral QHS  . phenytoin  300 mg Oral QHS  . scopolamine  1 patch Transdermal Q72H  . sodium bicarbonate  650 mg Oral TID   Continuous Infusions: . lactated ringers 100 mL/hr at 12/12/19 1007     LOS: 5 days        Hosie Poisson, MD [facility] 12/12/2019, 3:44 PM

## 2019-12-12 NOTE — Evaluation (Signed)
Physical Therapy Evaluation Patient Details Name: [name] MRN: [id number] DOB: June 19, 1951 Today's Date: 12/12/2019   History of Present Illness  69 year old with prior history of Alzheimer's dementia, right hemicolectomy.  Complicated by blind loop syndrome, small bowel bacterial overgrowth, stage III CKD, COPD, anemia, hypertension, CVA, PAD referral vascular disease, hyperlipidemia, history of MDR E. coli, VRE, history of seizures presents to ED for evaluation of abdominal pain.  She was found to have acute pancreatitis with elevated lipase levels.  Gastroenterology was consulted she underwent an MRCP that demonstrated acute pancreatitis without any biliary stones.    Clinical Impression  Pt admitted with above diagnosis. Pt initially tried to remain up in bedside chair at [facility], but when therapist walked back by room, the pt had intense pain and was requesting to get back in bed to get comfortable so therapist assisted pt back to bed. Pt with good bed mobility, able to safely come to EOB with increased time. Pt unsteady with ambulation, running into walls and objects with RW requiring min assist to safely navigate environment. Verbal cues for attention to RW and space to decrease risk for falls, but pt continues to require assistance with RW management with ambulation. Pt adamant that she wants to return home with her aide and son to assist. Pt currently with functional limitations due to the deficits listed below (see PT Problem List). Pt will benefit from skilled PT to increase their independence and safety with mobility to allow discharge to the venue listed below.       Follow Up Recommendations Home health PT;Supervision/Assistance - 24 hour    Equipment Recommendations  None recommended by PT    Recommendations for Other Services       Precautions / Restrictions Precautions Precautions: Fall Restrictions Weight Bearing Restrictions: No      Mobility  Bed Mobility Overal bed  mobility: Modified Independent             General bed mobility comments: increased time, use of bedrail for supine to sit  Transfers Overall transfer level: Needs assistance Equipment used: Rolling walker (2 wheeled) Transfers: Sit to/from Stand Sit to Stand: Min guard;Min assist         General transfer comment: min assist progressing to min guard assist with repeated reps, verbal cues to push from bed to rise from bed  Ambulation/Gait Ambulation/Gait assistance: Min assist Gait Distance (Feet): 25 Feet Assistive device: Rolling walker (2 wheeled) Gait Pattern/deviations: Step-to pattern;Decreased step length - right;Decreased step length - left Gait velocity: decreased   General Gait Details: steadying assistance and min assist with RW management due to pt running into all objects with RW frame, limited by back/abdominal pain, no near falls  Stairs            Wheelchair Mobility    Modified Rankin (Stroke Patients Only)       Balance Overall balance assessment: Needs assistance Sitting-balance support: Feet supported;No upper extremity supported Sitting balance-Leahy Scale: Good Sitting balance - Comments: seated EOB   Standing balance support: During functional activity;Bilateral upper extremity supported Standing balance-Leahy Scale: Fair Standing balance comment: with RW                             Pertinent Vitals/Pain Pain Assessment: 0-10 Pain Score: 7  Pain Location: low back Pain Descriptors / Indicators: Other (Comment)("pinching") Pain Intervention(s): Limited activity within patient's tolerance;Monitored during session;Premedicated before session    Home Living Family/patient  expects to be discharged to:: Private residence Living Arrangements: Children(Son works, 2 school aged grandchildren, aide that lives with them) Available Help at Discharge: Family;Available 24 hours/day;Personal care attendant Type of Home: House Home  Access: Stairs to enter Entrance Stairs-Rails: Right;Left;Can reach both Entrance Stairs-Number of Steps: 3 Home Layout: One level Home Equipment: Walker - 4 wheels;Cane - single point;Shower seat;Grab bars - tub/shower      Prior Function Level of Independence: Needs assistance   Gait / Transfers Assistance Needed: Pt reports independent with bed mobility and transfers, aide assists in steadying pt with rollator for household ambulation  ADL's / Homemaking Assistance Needed: Pt reports aide does cooking, cleaning, laundry, assists with grandkids  Comments: Pt reports household ambulator, doesn't ambulate outside of the house due to gravel driveway. Pt reports son provides transportation.     Hand Dominance        Extremity/Trunk Assessment   Upper Extremity Assessment Upper Extremity Assessment: Defer to OT evaluation    Lower Extremity Assessment Lower Extremity Assessment: Overall WFL for tasks assessed    Cervical / Trunk Assessment Cervical / Trunk Assessment: Normal  Communication      Cognition Arousal/Alertness: Awake/alert Behavior During Therapy: WFL for tasks assessed/performed Overall Cognitive Status: Within Functional Limits for tasks assessed                                        General Comments      Exercises     Assessment/Plan    PT Assessment Patient needs continued PT services  PT Problem List Decreased strength;Decreased activity tolerance;Decreased balance;Decreased mobility;Pain       PT Treatment Interventions DME instruction;Gait training;Stair training;Functional mobility training;Therapeutic activities;Therapeutic exercise;Balance training;Neuromuscular re-education;Patient/family education;Modalities    PT Goals (Current goals can be found in the Care Plan section)  Acute Rehab PT Goals Patient Stated Goal: go home with son and aide to assist PT Goal Formulation: With patient Time For Goal Achievement:  12/19/19 Potential to Achieve Goals: Good    Frequency Min 3X/week   Barriers to discharge        Co-evaluation               AM-PAC PT "6 Clicks" Mobility  Outcome Measure Help needed turning from your back to your side while in a flat bed without using bedrails?: None Help needed moving from lying on your back to sitting on the side of a flat bed without using bedrails?: None Help needed moving to and from a bed to a chair (including a wheelchair)?: A Little Help needed standing up from a chair using your arms (e.g., wheelchair or bedside chair)?: A Little Help needed to walk in hospital room?: A Little Help needed climbing 3-5 steps with a railing? : A Lot 6 Click Score: 19    End of Session Equipment Utilized During Treatment: Gait belt Activity Tolerance: Patient limited by pain Patient left: in bed;with call bell/phone within reach;with bed alarm set Nurse Communication: Mobility status PT Visit Diagnosis: Other abnormalities of gait and mobility (R26.89);Unsteadiness on feet (R26.81)    Time: 1052-1130 PT Time Calculation (min) (ACUTE ONLY): 38 min   Charges:   PT Evaluation $PT Eval Moderate Complexity: 1 Mod PT Treatments $Gait Training: 8-22 mins         Tori Giabella Duhart PT, DPT 12/12/19, 1:03 PM [phone]

## 2019-12-12 NOTE — Evaluation (Signed)
Occupational Therapy Evaluation Patient Details Name: [name] MRN: [id number] DOB: March 23, 1951 Today's Date: 12/12/2019    History of Present Illness 69 year old with prior history of Alzheimer's dementia, right hemicolectomy.  Complicated by blind loop syndrome, small bowel bacterial overgrowth, stage III CKD, COPD, anemia, hypertension, CVA, PAD referral vascular disease, hyperlipidemia, history of MDR E. coli, VRE, history of seizures presents to ED for evaluation of abdominal pain.  She was found to have acute pancreatitis with elevated lipase levels.  Gastroenterology was consulted she underwent an MRCP that demonstrated acute pancreatitis without any biliary stones.   Clinical Impression   PTA Pt was at home with son/grandchildren and aide. For the most part she is independent in ADL - aide is live in and assist with mobility with rollator. Aide performs IADL (cooking cleaning etc). At this time, while patient is limited by pain she was still able to complete LB dressing, transfers min guard with RW, seated grooming (limited by pain). She has essential DME for bathing safety. Her son transports her. Pt has no further acute needs for OT, and OT will sign off at this time.     Follow Up Recommendations  No OT follow up;Supervision/Assistance - 24 hour    Equipment Recommendations  None recommended by OT(Pt has appropriate DME at home)    Recommendations for Other Services       Precautions / Restrictions Precautions Precautions: Fall Restrictions Weight Bearing Restrictions: No      Mobility Bed Mobility Overal bed mobility: Modified Independent             General bed mobility comments: increased time, use of bedrail for supine to sit  Transfers Overall transfer level: Needs assistance Equipment used: Rolling walker (2 wheeled) Transfers: Sit to/from Stand Sit to Stand: Min guard         General transfer comment: vc for safe hand placement    Balance Overall  balance assessment: Needs assistance Sitting-balance support: Feet supported;No upper extremity supported Sitting balance-Leahy Scale: Good Sitting balance - Comments: seated EOB   Standing balance support: During functional activity;Bilateral upper extremity supported Standing balance-Leahy Scale: Fair Standing balance comment: with RW                           ADL either performed or assessed with clinical judgement   ADL Overall ADL's : At baseline                                       General ADL Comments: limited by pain, but able to don/doff socks, perform transfers at supervision/min guard level, seated grooming tasks (limited by pain)     Vision Baseline Vision/History: Wears glasses Wears Glasses: At all times Patient Visual Report: No change from baseline       Perception     Praxis      Pertinent Vitals/Pain Pain Assessment: 0-10 Pain Score: 7  Pain Location: abdomen Pain Descriptors / Indicators: Discomfort;Constant;Grimacing;Sharp;Shooting Pain Intervention(s): Limited activity within patient's tolerance;Monitored during session;Repositioned     Hand Dominance Right   Extremity/Trunk Assessment Upper Extremity Assessment Upper Extremity Assessment: Overall WFL for tasks assessed   Lower Extremity Assessment Lower Extremity Assessment: Overall WFL for tasks assessed   Cervical / Trunk Assessment Cervical / Trunk Assessment: Normal   Communication Communication Communication: HOH   Cognition Arousal/Alertness: Awake/alert Behavior During Therapy: WFL for  tasks assessed/performed Overall Cognitive Status: History of cognitive impairments - at baseline                                 General Comments: baseline dementia   General Comments       Exercises     Shoulder Instructions      Home Living Family/patient expects to be discharged to:: Private residence Living Arrangements: Children(Son works, 2  school aged grandchildren, aide that lives with them) Available Help at Discharge: Family;Available 24 hours/day;Personal care attendant Type of Home: House Home Access: Stairs to enter [employer] of Steps: 3 Entrance Stairs-Rails: Right;Left;Can reach both Home Layout: One level     Bathroom Shower/Tub: Teacher, early years/pre: Handicapped height     Home Equipment: Environmental consultant - 4 wheels;Cane - single point;Shower seat;Grab bars - tub/shower   Additional Comments: information consistent with chart review and previous admissions      Prior Functioning/Environment Level of Independence: Needs assistance  Gait / Transfers Assistance Needed: Pt reports independent with bed mobility and transfers, aide assists in steadying pt with rollator for household ambulation ADL's / Homemaking Assistance Needed: Pt reports aide does cooking, cleaning, laundry, assists with grandkids   Comments: Pt reports household ambulator, doesn't ambulate outside of the house due to gravel driveway. Pt reports son provides transportation.        OT Problem List: Decreased activity tolerance;Impaired balance (sitting and/or standing);Pain      OT Treatment/Interventions:      OT Goals(Current goals can be found in the care plan section) Acute Rehab OT Goals Patient Stated Goal: go home with son and aide to assist OT Goal Formulation: With patient Time For Goal Achievement: 12/26/19 Potential to Achieve Goals: Good  OT Frequency:     Barriers to D/C:            Co-evaluation              AM-PAC OT "6 Clicks" Daily Activity     Outcome Measure Help from another person eating meals?: None Help from another person taking care of personal grooming?: A Little Help from another person toileting, which includes using toliet, bedpan, or urinal?: A Little Help from another person bathing (including washing, rinsing, drying)?: A Little Help from another person to put on and  taking off regular upper body clothing?: None Help from another person to put on and taking off regular lower body clothing?: None 6 Click Score: 21   End of Session Equipment Utilized During Treatment: Rolling walker;Gait belt Nurse Communication: Mobility status;Patient requests pain meds  Activity Tolerance: Patient limited by pain Patient left: in bed;with call bell/phone within reach  OT Visit Diagnosis: Other abnormalities of gait and mobility (R26.89);Pain Pain - part of body: (abdomen)                Time: [id number] OT Time Calculation (min): 12 min Charges:  OT General Charges $OT Visit: 1 Visit OT Evaluation $OT Eval Low Complexity: [place] OTR/L Acute Rehabilitation Services Pager: [phone] Office: [place] 12/12/2019, 1:24 PM

## 2019-12-13 LAB — CBC
HCT: 26.6 % — ABNORMAL LOW (ref 36.0–46.0)
Hemoglobin: 7.9 g/dL — ABNORMAL LOW (ref 12.0–15.0)
MCH: 29.3 pg (ref 26.0–34.0)
MCHC: 29.7 g/dL — ABNORMAL LOW (ref 30.0–36.0)
MCV: 98.5 fL (ref 80.0–100.0)
Platelets: 110 10*3/uL — ABNORMAL LOW (ref 150–400)
RBC: 2.7 MIL/uL — ABNORMAL LOW (ref 3.87–5.11)
RDW: 15 % (ref 11.5–15.5)
WBC: 7.2 10*3/uL (ref 4.0–10.5)
nRBC: 0 % (ref 0.0–0.2)

## 2019-12-13 LAB — GLUCOSE, CAPILLARY
Glucose-Capillary: 94 mg/dL (ref 70–99)
Glucose-Capillary: 85 mg/dL (ref 70–99)
Glucose-Capillary: 87 mg/dL (ref 70–99)
Glucose-Capillary: 83 mg/dL (ref 70–99)
Glucose-Capillary: 74 mg/dL (ref 70–99)

## 2019-12-13 LAB — BASIC METABOLIC PANEL
Anion gap: 7 (ref 5–15)
BUN: 11 mg/dL (ref 8–23)
CO2: 23 mmol/L (ref 22–32)
Calcium: 7.4 mg/dL — ABNORMAL LOW (ref 8.9–10.3)
Chloride: 108 mmol/L (ref 98–111)
Creatinine, Ser: 1.08 mg/dL — ABNORMAL HIGH (ref 0.44–1.00)
GFR calc Af Amer: >60 mL/min (ref >60)
GFR calc non Af Amer: 53 mL/min — ABNORMAL LOW (ref >60)
Glucose, Bld: 82 mg/dL (ref 70–99)
Potassium: 4.7 mmol/L (ref 3.5–5.1)
Sodium: 138 mmol/L (ref 135–145)

## 2019-12-13 LAB — LIPASE, BLOOD: Lipase: 56 U/L — ABNORMAL HIGH (ref 11–51)

## 2019-12-13 MED ORDER — HYDROMORPHONE HCL 1 MG/ML PO LIQD
0.5000 mg | ORAL | Status: DC | PRN
  Administered 2019-12-13 – 2019-12-14 (×5): 0.5 mg via ORAL
  Filled 2019-12-13 (×5): qty 1

## 2019-12-13 NOTE — Progress Notes (Signed)
Patient lost IV access around 13:30 pm, attempted IV, IV team attempted twice and was unsuccessful. Patient not hooked up to IV fluids as a result. Doctor was made aware.

## 2019-12-13 NOTE — Progress Notes (Signed)
PROGRESS NOTE    [name]  [id number] DOB: 1951-03-07 DOA: 12/07/2019 PCP: Jeanie Sewer, NP   Brief Narrative:   69 year old with prior history of Alzheimer's dementia, right hemicolectomy.  Complicated by blind loop syndrome, small bowel bacterial overgrowth, stage III CKD, COPD, anemia, hypertension, CVA, PAD referral vascular disease, hyperlipidemia, history of MDR E. coli, VRE, history of seizures presents to ED for evaluation of abdominal pain.  She was found to have acute pancreatitis with elevated lipase levels.  Gastroenterology was consulted she underwent an MRCP that demonstrated acute pancreatitis without any biliary stones.  She was started on clear liquid diet and advance to soft diet today.   Pt seen and examined, abd pain is improved.   Assessment & Plan:   Principal Problem:   Acute pancreatitis Active Problems:   AKI (acute kidney injury) ([place])   Catheter-associated urinary tract infection (HCC)   Hyperkalemia   QT prolongation   Severe acute pancreatitis She underwent MRCP on admission showing acute pancreatitis without any biliary stones, some narrowing of ducts in the area of the pancreatic head secondary to acute inflammation/compression.  She was started on IV antibiotics empirically.  There are no necrosis seen at this time.  her lipase levels are improving but have not normalized. Patient able to tolerate diet without any nausea vomiting and abdominal pain has much improved. IV Dilaudid discontinued and transition to oral pain medications. If patient is able to tolerate soft diet will plan for discharge tomorrow with home PT.   Catheter associated urinary tract infection Patient has an indwelling Foley catheter  placed by urology.  She has a history of enterocystoplasty/bladder augmentation requiring an indwelling stent secondary to ureteral stricture.  CT abdomen and pelvis showed left ureter stent in place and decreased hydronephrosis of the left  kidney when compared to the prior study.  Urine analysis was abnormal on admission and urine cultures show multiple bacteria the sample from 12/07/2019.  Completed 5 days of IV meropenem. No new complaints at this time  Hypertension:  Well-controlled   Dementia:  Resume Aricept   H/o seizures:  No seizures overnight, continue with home medications.   Hypokalemia; replaced  AKI;  Resolved with hydration.   Anemia of chronic disease;  Baseline hemoglobin appears to be between 8-9 She was admitted with a hemoglobin of 12 which is probably a hemoconcentrated sample. Hemoglobin is stable around 7.9   Mild leukocytosis suspect from pancreatitis. Resolved  Prolonged QTC; Probably from electrolyte abnormalities.  Replace potassium and magnesium keep K>4 and Magnesium >2.  Repeat EKG showed improved QTC to 475.  Thrombocytopenia:  Appears to be chronic, she had platelet coutn of 98,000 in 03/2019.  Platelet count at 110000 No bleeding seen.     DVT prophylaxis: SCD'S Code Status: DNR.  Family Communication: none at bedside. Discussed with family over the phone.  Disposition Plan: Possible discharge home tomorrow  Consultants:   Gastroenterology.   Procedures: MRCP.   Antimicrobials Anti-infectives (From admission, onward)   Start     Dose/Rate Route Frequency Ordered Stop   12/10/19 1400  meropenem (MERREM) 1 g in sodium chloride 0.9 % 100 mL IVPB     1 g 200 mL/hr over 30 Minutes Intravenous Every 12 hours 12/10/19 1222 12/11/19 1348   12/08/19 0015  meropenem (MERREM) 1 g in sodium chloride 0.9 % 100 mL IVPB  Status:  Discontinued     1 g 200 mL/hr over 30 Minutes Intravenous Every 12 hours 12/08/19 0009 12/10/19  1222   12/07/19 2200  cefTRIAXone (ROCEPHIN) 1 g in sodium chloride 0.9 % 100 mL IVPB  Status:  Discontinued     1 g 200 mL/hr over 30 Minutes Intravenous  Once 12/07/19 2150 12/07/19 2330      Subjective: Patient denies any nausea vomiting and  abdominal pain has much improved.  Objective:  Vitals:   12/12/19 1413 12/12/19 2050 12/13/19 0509 12/13/19 1342  BP: (!) 148/78 (!) 159/64 (!) 143/67 (!) 147/68  Pulse: 86 83 68 66  Resp: 17 12 15 15   Temp: 98.8 F (37.1 C) 98.3 F (36.8 C) 99.2 F (37.3 C) 97.8 F (36.6 C)  TempSrc: Oral Oral Oral Oral  SpO2: 96% 100% 95% 97%  Weight:      Height:        Intake/Output Summary (Last 24 hours) at 12/13/2019 1809 Last data filed at 12/13/2019 1801 Gross per 24 hour  Intake 5445.14 ml  Output 2050 ml  Net 3395.14 ml   Filed Weights   12/07/19 2051  Weight: 37.2 kg    Examination:  General exam: Alert and comfortable, not in any kind of distress Respiratory system: Clear to auscultation bilaterally, no wheezing or rhonchi. Cardiovascular system: S1-S2 heard, regular rate rhythm, no pedal edema Gastrointestinal system: Abdomen is soft, no tenderness bowel sounds normal Central nervous system: Alert and able to answer all questions. Extremities: No pedal edema Skin: No rashes seen Psychiatry: . Mood is appropriate Data Reviewed: I have personally reviewed following labs and imaging studies  CBC: Recent Labs  Lab 12/07/19 2030 12/08/19 0500 12/09/19 0219 12/10/19 0219 12/11/19 0843 12/13/19 0612  WBC 15.3* 14.8* 14.0* 11.0* 8.3 7.2  NEUTROABS 12.7* 12.2*  --   --  5.0  --   HGB 12.1 12.3 10.9* 8.8* 8.4* 7.9*  HCT 39.2 40.0 35.3* 27.8* 27.3* 26.6*  MCV 97.0 95.9 94.4 94.2 96.5 98.5  PLT 242 172 98* 92* 88* [id number]*   Basic Metabolic Panel: Recent Labs  Lab 12/07/19 2320 12/09/19 0219 12/10/19 0219 12/11/19 0843 12/12/19 1021 12/13/19 0612  NA  --  144 136 139 137 138  K  --  3.5 3.0* 4.6 4.6 4.7  CL  --  113* 100 107 105 108  CO2  --  21* 26 26 25 23   GLUCOSE  --  103* 93 90 93 82  BUN  --  22 20 18 14 11   CREATININE  --  1.20* 1.05* 1.13* 1.09* 1.08*  CALCIUM  --  7.8* 6.9* 7.3* 7.4* 7.4*  MG 2.0  --   --   --   --   --    GFR: Estimated Creatinine  Clearance: 29.3 mL/min (A) (by C-G formula based on SCr of 1.08 mg/dL (H)). Liver Function Tests: Recent Labs  Lab 12/07/19 1913 12/08/19 0500 12/09/19 0219 12/10/19 0219 12/11/19 0843  AST 21 13* 15 15 13*  ALT 7 6 7 6 7   ALKPHOS 210* 193* 155* 116 110  BILITOT 1.2 0.6 0.9 0.7 0.5  PROT 7.5 6.5 5.8* 4.6* 4.4*  ALBUMIN 3.2* 2.8* 2.5* 2.0* 1.8*   Recent Labs  Lab 12/09/19 0219 12/10/19 0219 12/11/19 0843 12/12/19 1021 12/13/19 0612  LIPASE 521* 154* 106* 75* 56*   No results for input(s): AMMONIA in the last 168 hours. Coagulation Profile: No results for input(s): INR, PROTIME in the last 168 hours. Cardiac Enzymes: No results for input(s): CKTOTAL, CKMB, CKMBINDEX, TROPONINI in the last 168 hours. BNP (last 3 results) No results  for input(s): PROBNP in the last 8760 hours. HbA1C: No results for input(s): HGBA1C in the last 72 hours. CBG: Recent Labs  Lab 12/12/19 2357 12/13/19 0400 12/13/19 0754 12/13/19 1151 12/13/19 1555  GLUCAP 103* 94 85 87 83   Lipid Profile: No results for input(s): CHOL, HDL, LDLCALC, TRIG, CHOLHDL, LDLDIRECT in the last 72 hours. Thyroid Function Tests: No results for input(s): TSH, T4TOTAL, FREET4, T3FREE, THYROIDAB in the last 72 hours. Anemia Panel: No results for input(s): VITAMINB12, FOLATE, FERRITIN, TIBC, IRON, RETICCTPCT in the last 72 hours. Sepsis Labs: Recent Labs  Lab 12/07/19 2257  LATICACIDVEN 1.1    Recent Results (from the past 240 hour(s))  Urine culture     Status: Abnormal   Collection Time: 12/07/19  7:14 PM   Specimen: Urine, Clean Catch  Result Value Ref Range Status   Specimen Description   Final    URINE, CLEAN CATCH Performed at [facility], [place] [address] [place], [place] [address]    Special Requests   Final    NONE Performed at [facility], [place] [address], [place], [place] [address]    Culture MULTIPLE SPECIES PRESENT, SUGGEST RECOLLECTION (A)  Final    Report Status 12/08/2019 FINAL  Final  SARS CORONAVIRUS 2 (TAT 6-24 HRS) Nasopharyngeal Nasopharyngeal Swab     Status: None   Collection Time: 12/07/19 10:33 PM   Specimen: Nasopharyngeal Swab  Result Value Ref Range Status   SARS Coronavirus 2 NEGATIVE NEGATIVE Final    Comment: (NOTE) SARS-CoV-2 target nucleic acids are NOT DETECTED. The SARS-CoV-2 RNA is generally detectable in upper and lower respiratory specimens during the acute phase of infection. Negative results do not preclude SARS-CoV-2 infection, do not rule out co-infections with other pathogens, and should not be used as the sole basis for treatment or other patient management decisions. Negative results must be combined with clinical observations, patient history, and epidemiological information. The expected result is Negative. Fact Sheet for Patients: SugarRoll.be Fact Sheet for Healthcare Providers: https://www.woods-mathews.com/ This test is not yet approved or cleared by the [place] FDA and  has been authorized for detection and/or diagnosis of SARS-CoV-2 by FDA under an Emergency Use Authorization (EUA). This EUA will remain  in effect (meaning this test can be used) for the duration of the COVID-19 declaration under Section 56 4(b)(1) of the Act, 21 U.S.C. section 360bbb-3(b)(1), unless the authorization is terminated or revoked sooner. Performed at [facility], [place] [address] [place], [place] [address]   MRSA PCR Screening     Status: None   Collection Time: 12/08/19 11:32 PM   Specimen: Nasal Mucosa; Nasopharyngeal  Result Value Ref Range Status   MRSA by PCR NEGATIVE NEGATIVE Final    Comment:        The GeneXpert MRSA Assay (FDA approved for NASAL specimens only), is one component of a comprehensive MRSA colonization surveillance program. It is not intended to diagnose MRSA infection nor to guide or monitor treatment for MRSA infections.  Performed at [facility], [place] [address], [place], [place] [address]   Culture, Urine     Status: Abnormal   Collection Time: 12/09/19  5:26 PM   Specimen: In/Out Cath Urine  Result Value Ref Range Status   Specimen Description   Final    IN/OUT CATH URINE Performed at [place] [address], [place], [place] [address]    Special Requests   Final    NONE  Performed at [facility], [place] [address] [place], [place] [address]    Culture MULTIPLE SPECIES PRESENT, SUGGEST RECOLLECTION (A)  Final   Report Status 12/10/2019 FINAL  Final         Radiology Studies: No results found.      Scheduled Meds: . carvedilol  6.25 mg Oral BID  . Chlorhexidine Gluconate Cloth  6 each Topical Daily  . docusate sodium  100 mg Oral Daily  . donepezil  10 mg Oral QHS  . hydrALAZINE  50 mg Oral TID  . isosorbide mononitrate  30 mg Oral Daily  . levETIRAcetam  500 mg Oral BID  . mouth rinse  15 mL Mouth Rinse BID  . Melatonin  3 mg Oral QHS  . phenytoin  300 mg Oral QHS  . scopolamine  1 patch Transdermal Q72H  . sodium bicarbonate  650 mg Oral TID   Continuous Infusions: . lactated ringers Stopped (12/13/19 1345)     LOS: 6 days        Hosie Poisson, MD [facility] 12/13/2019, 6:09 PM

## 2019-12-14 LAB — GLUCOSE, CAPILLARY
Glucose-Capillary: 78 mg/dL (ref 70–99)
Glucose-Capillary: 80 mg/dL (ref 70–99)
Glucose-Capillary: 83 mg/dL (ref 70–99)
Glucose-Capillary: 87 mg/dL (ref 70–99)

## 2019-12-14 MED ORDER — TRAMADOL HCL 50 MG PO TABS: 50.0000 mg | ORAL_TABLET | Freq: Two times a day (BID) | ORAL | 0 refills | Status: AC | PRN

## 2019-12-14 NOTE — Progress Notes (Signed)
Physical Therapy Treatment Patient Details Name: [name] MRN: [id number] DOB: [birth year] Today's Date: 12/14/2019    History of Present Illness 69 year old with prior history of Alzheimer's dementia, right hemicolectomy.  Complicated by blind loop syndrome, small bowel bacterial overgrowth, stage III CKD, COPD, anemia, hypertension, CVA, PAD referral vascular disease, hyperlipidemia, history of MDR E. coli, VRE, history of seizures presents to ED for evaluation of abdominal pain.  She was found to have acute pancreatitis with elevated lipase levels.  Gastroenterology was consulted she underwent an MRCP that demonstrated acute pancreatitis without any biliary stones.    PT Comments    Pt able to increase ambulation distance today.  She did demonstrate decreased safety as gait progressed and she fatigued. Discussed home safety with pt and she states she always has family with her on the stairs and with walking.  Attempt stairs in the future if pt agreeable. Con't to recommend HHPT.   Follow Up Recommendations  Home health PT;Supervision/Assistance - 24 hour     Equipment Recommendations  None recommended by PT    Recommendations for Other Services       Precautions / Restrictions Precautions Precautions: Fall Restrictions Weight Bearing Restrictions: No    Mobility  Bed Mobility Overal bed mobility: Modified Independent                Transfers Overall transfer level: Needs assistance Equipment used: Rolling walker (2 wheeled) Transfers: Sit to/from Stand Sit to Stand: Min guard;Min assist         General transfer comment: min/guard for safety, MIN A stand > sit due to wanting to sit too early  Ambulation/Gait Ambulation/Gait assistance: Min guard Gait Distance (Feet): 80 Feet Assistive device: Rolling walker (2 wheeled) Gait Pattern/deviations: Decreased step length - right;Decreased step length - left;Trunk flexed Gait velocity: decreased   General Gait  Details: min/guard with 1 standing rest break.  safety decreased towards end of gait as she fatigued. Trying to sit too early before she was fully in front of the bed.   Stairs             Wheelchair Mobility    Modified Rankin (Stroke Patients Only)       Balance   Sitting-balance support: Feet supported;No upper extremity supported Sitting balance-Leahy Scale: Good     Standing balance support: During functional activity;Bilateral upper extremity supported Standing balance-Leahy Scale: Fair Standing balance comment: with RW                            Cognition Arousal/Alertness: Awake/alert Behavior During Therapy: WFL for tasks assessed/performed Overall Cognitive Status: History of cognitive impairments - at baseline                                 General Comments: baseline dementia      Exercises      General Comments        Pertinent Vitals/Pain Pain Assessment: Faces Faces Pain Scale: Hurts even more Pain Location: abdomen Pain Descriptors / Indicators: Grimacing Pain Intervention(s): Limited activity within patient's tolerance;Monitored during session;RN gave pain meds during session    Home Living                      Prior Function            PT Goals (current goals can now be found in  the care plan section) Acute Rehab PT Goals PT Goal Formulation: With patient Time For Goal Achievement: 12/19/19 Potential to Achieve Goals: Good Progress towards PT goals: Progressing toward goals    Frequency    Min 3X/week      PT Plan Current plan remains appropriate    Co-evaluation              AM-PAC PT "6 Clicks" Mobility   Outcome Measure  Help needed turning from your back to your side while in a flat bed without using bedrails?: None Help needed moving from lying on your back to sitting on the side of a flat bed without using bedrails?: None Help needed moving to and from a bed to a chair  (including a wheelchair)?: A Little Help needed standing up from a chair using your arms (e.g., wheelchair or bedside chair)?: A Little Help needed to walk in hospital room?: A Little Help needed climbing 3-5 steps with a railing? : A Little 6 Click Score: 20    End of Session Equipment Utilized During Treatment: Gait belt Activity Tolerance: Patient tolerated treatment well;Patient limited by pain Patient left: in bed;with call bell/phone within reach Nurse Communication: Mobility status PT Visit Diagnosis: Other abnormalities of gait and mobility (R26.89);Unsteadiness on feet (R26.81)     Time: [id number] PT Time Calculation (min) (ACUTE ONLY): 15 min  Charges:  $Gait Training: 8-22 mins                     Gerhart Ruggieri L. Tamala Julian, [place] Pager [phone] 12/14/2019    Galen Manila 12/14/2019, 11:17 AM

## 2019-12-14 NOTE — TOC Initial Note (Signed)
Transition of Care [facility]) - Initial/Assessment Note    Patient Details  Name: [name] MRN: [id number] Date of Birth: January 29, 1951  Transition of [facility]) CM/SW Contact:    Lynnell Catalan, RN Phone Number: 12/14/2019, 4:06 PM  Clinical Narrative:                  This CM spoke with pt for DC planning. HH was recommended by PT and this CM attempted to give referral to multiple home health agencies with no success due to pt insurance. Pt states that she lives with her son and someone is home with her all the time.   Expected Discharge Plan: [place] Barriers to Discharge: No Barriers Identified   Patient Goals and CMS Choice        Expected Discharge Plan and Services Expected Discharge Plan: [place]         Expected Discharge Date: 12/14/19                     Activities of Daily Living Home Assistive Devices/Equipment: Nebulizer, Environmental consultant (specify type), Other (Comment)(foley catheter, front wheeled walker) ADL Screening (condition at time of admission) Patient's cognitive ability adequate to safely complete daily activities?: No(patient very sleepy) Is the patient deaf or have difficulty hearing?: No Does the patient have difficulty seeing, even when wearing glasses/contacts?: No Does the patient have difficulty concentrating, remembering, or making decisions?: No Patient able to express need for assistance with ADLs?: Yes Does the patient have difficulty dressing or bathing?: Yes Independently performs ADLs?: No Communication: Independent Dressing (OT): Dependent Is this a change from baseline?: Change from baseline, expected to last >3 days Grooming: Dependent Is this a change from baseline?: Change from baseline, expected to last >3 days Feeding: Dependent Is this a change from baseline?: Change from baseline, expected to last >3 days Bathing: Dependent Is this a change from baseline?: Change from baseline, expected to  last >3 days Toileting: Dependent Is this a change from baseline?: Change from baseline, expected to last >3days In/Out Bed: Dependent Is this a change from baseline?: Change from baseline, expected to last >3 days Walks in Home: Dependent Is this a change from baseline?: Change from baseline, expected to last >3 days Does the patient have difficulty walking or climbing stairs?: Yes(secondary to weakness) Weakness of Legs: Both Weakness of Arms/Hands: Both  Permission Sought/Granted                  Emotional Assessment              Admission diagnosis:  Acute pancreatitis [K85.90] Common bile duct (CBD) obstruction [K83.1] Urinary tract infection without hematuria, site unspecified [N39.0] Leukocytosis, unspecified type [D72.829] Acute pancreatitis, unspecified complication status, unspecified pancreatitis type [K85.90] Patient Active Problem List   Diagnosis Date Noted  . Catheter-associated urinary tract infection ([place]) 12/08/2019  . Hyperkalemia 12/08/2019  . QT prolongation 12/08/2019  . Acute pancreatitis 12/07/2019  . DOE (dyspnea on exertion) 04/28/2019  . Febrile respiratory illness 04/13/2019  . Seizures ([place]) 04/13/2019  . Accidental drug overdose 04/13/2019  . Dementia arising in the senium and presenium ([place]) 04/13/2019  . NSAID overdose 03/27/2019  . Anemia, unspecified 03/27/2019  . Delirium 03/27/2019  . Palliative care by specialist   . Advanced care planning/counseling discussion   . Goals of care, counseling/discussion   . AKI (acute kidney injury) ([place]) 03/26/2019  . Alzheimer's dementia ([place]) 03/26/2019  . COPD  with acute exacerbation ([place]) 03/26/2019  . Acute on chronic respiratory failure with hypoxia ([place]) 03/24/2019  . Acute cholecystitis due to biliary calculus 10/23/2018  . Biliary colic [id number]  . PAD (peripheral artery disease) ([place]) 06/06/2018  . Generalized idiopathic epilepsy and epileptic syndromes, not intractable, with  status epilepticus ([place]) 06/06/2018  . Chest pain 06/06/2018  . Diarrhea 06/06/2018  . HTN (hypertension) 06/06/2018  . HLD (hyperlipidemia) 06/06/2018  . Ventilator dependent ([place])   . Lactic acidosis   . Acute renal failure ([place])   . Recurrent sepsis due to urinary tract infection ([place])   . Iliac artery stenosis, left ([place]) 05/14/2014  . Swollen feet 02/01/2014  . Pain in limb-Left Great toe, 4th and  5th digit 02/01/2014  . Thromboembolism of lower extremity artery ([place]) 02/01/2014  . UTI (urinary tract infection) due to Enterococcus 01/20/2014  . Acute blood loss anemia 01/20/2014  . Protein-calorie malnutrition, severe ([place]) 01/18/2014  . Critical lower limb ischemia 01/17/2014  . Subacute delirium 10/14/2013  . Renal atrophy, left 06/23/2013  . Long term current use of opiate analgesic 12/11/2012  . Low back pain 12/11/2012  . Hip pain, bilateral 12/11/2012  . Tobacco use disorder 12/11/2012  . Flank pain, chronic 12/09/2012  . DYSPHAGIA UNSPECIFIED 10/28/2008  . HYPOVOLEMIA 10/25/2008  . 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 [place] DISEASE 10/25/2008  . COPD (chronic obstructive pulmonary disease) ([place]) 10/25/2008  . ESOPHAGEAL STRICTURE 10/25/2008  . GERD 10/25/2008  . HIATAL HERNIA 10/25/2008  . SMALL BOWEL OBSTRUCTION 10/25/2008  . IBS 10/25/2008  . OTHER RETROPERITONEAL ABSCESS 10/25/2008  . Blind loop syndrome 10/25/2008  . OTHER AND UNSPECIFIED POSTSURGICAL NONABSORPTION 10/25/2008  . PYELONEPHRITIS 10/25/2008  . RENAL CALCULUS 10/25/2008  . INTERSTITIAL CYSTITIS 10/25/2008  . ENDOMETRIOSIS 10/25/2008  . PSORIASIS 10/25/2008  . HEADACHE, CHRONIC 10/25/2008  . COLONIC POLYPS, HX OF 10/25/2008   PCP:  Jeanie Sewer, NP Pharmacy:   [facility], [place] [address] Phone: [phone] Fax: [phone]  [id number] - [place], [place] [address] Phone: [phone] Fax: [phone]     Social Determinants of Health (SDOH) Interventions    Readmission Risk Interventions Readmission Risk Prevention Plan 03/27/2019  Transportation Screening Complete  Home Care Screening Complete  Some recent data might be hidden

## 2019-12-14 NOTE — Progress Notes (Signed)
Pt discharged home in stable condition. Discharge instructions given. Script sent to pharmacy of choice. No immediate questions or concerns at this time. Pt discharged from unit via wheelchair.  

## 2019-12-14 NOTE — Care Management Important Message (Signed)
Important Message  Patient Details IM Letter given to Marney Doctor RN Case Manager to present to the Patient Name: MORGANN TELEP MRN: [id number] Date of Birth: 05-31-1951   Medicare Important Message Given:  Yes     Kerin Salen 12/14/2019, 11:00 AM

## 2019-12-16 NOTE — Discharge Summary (Signed)
Physician Discharge Summary  [name] [id number] DOB: 04-29-1951 DOA: 12/07/2019  PCP: Jeanie Sewer, NP  Admit date: 12/07/2019 Discharge date: 12/15/2019  Admitted From: Home.  Disposition:  Home.   Recommendations for Outpatient Follow-up:  1. Follow up with PCP in 1-2 weeks 2. Please obtain BMP/CBC in one week   Home Health:yes  Discharge Condition:stable. CODE STATUS:DNR Diet recommendation: Heart Healthy   Brief/Interim Summary: 69 year old with prior history of Alzheimer's dementia, right hemicolectomy.  Complicated by blind loop syndrome, small bowel bacterial overgrowth, stage III CKD, COPD, anemia, hypertension, CVA, PAD referral vascular disease, hyperlipidemia, history of MDR E. coli, VRE, history of seizures presents to ED for evaluation of abdominal pain.  She was found to have acute pancreatitis with elevated lipase levels.  Gastroenterology was consulted she underwent an MRCP that demonstrated acute pancreatitis without any biliary stones.  She was started on clear liquid diet and advance to soft diet today.    Discharge Diagnoses:  Principal Problem:   Acute pancreatitis Active Problems:   AKI (acute kidney injury) ([place])   Catheter-associated urinary tract infection (HCC)   Hyperkalemia   QT prolongation  Severe acute pancreatitis She underwent MRCP on admission showing acute pancreatitis without any biliary stones, some narrowing of ducts in the area of the pancreatic head secondary to acute inflammation/compression.  She was started on IV antibiotics empirically.  There are no necrosis seen at this time.  her lipase levels are improving but have not normalized. Patient able to tolerate diet without any nausea vomiting and abdominal pain has much improved. IV Dilaudid discontinued and transition to oral pain medications. If patient is able to tolerate soft diet will plan for discharge tomorrow with home PT.   Catheter associated urinary tract  infection Patient has an indwelling Foley catheter  placed by urology.  She has a history of enterocystoplasty/bladder augmentation requiring an indwelling stent secondary to ureteral stricture.  CT abdomen and pelvis showed left ureter stent in place and decreased hydronephrosis of the left kidney when compared to the prior study.  Urine analysis was abnormal on admission and urine cultures show multiple bacteria the sample from 12/07/2019.  Completed 5 days of IV meropenem. No new complaints at this time  Hypertension:  Well-controlled   Dementia:  Resume Aricept   H/o seizures:  No seizures overnight, continue with home medications.   Hypokalemia; replaced  AKI;  Resolved with hydration.   Anemia of chronic disease;  Baseline hemoglobin appears to be between 8-9 She was admitted with a hemoglobin of 12 which is probably a hemoconcentrated sample. Hemoglobin is stable around 7.9   Mild leukocytosis suspect from pancreatitis. Resolved  Prolonged QTC; Probably from electrolyte abnormalities.  Replace potassium and magnesium keep K>4 and Magnesium >2.  Repeat EKG showed improved QTC to 475.  Thrombocytopenia:  Appears to be chronic, she had platelet coutn of 98,000 in 03/2019.  Platelet count at 110000 No bleeding seen.     Discharge Instructions  Discharge Instructions    Diet - low sodium heart healthy   Complete by: As directed    Discharge instructions   Complete by: As directed    Please follow up with PCp in one week.     Allergies as of 12/14/2019      Reactions   Codeine Nausea And Vomiting   REACTION: intolerance   Ace Inhibitors    Latex Other (See Comments)   itching      Medication List    STOP taking these  medications   ferrous sulfate 325 (65 FE) MG tablet   furosemide 40 MG tablet Commonly known as: Lasix   ibuprofen 600 MG tablet Commonly known as: ADVIL     TAKE these medications   acetaminophen 500 MG  tablet Commonly known as: TYLENOL Take 500 mg by mouth every 6 (six) hours as needed for moderate pain.   albuterol (2.5 MG/3ML) 0.083% nebulizer solution Commonly known as: PROVENTIL Take 2.5 mg by nebulization every 6 (six) hours as needed for wheezing or shortness of breath.   atorvastatin 20 MG tablet Commonly known as: LIPITOR Take 20 mg by mouth daily.   carvedilol 6.25 MG tablet Commonly known as: COREG Take 6.25 mg by mouth 2 (two) times daily.   COLACE PO Take 1 tablet by mouth at bedtime.   diphenoxylate-atropine 2.5-0.025 MG tablet Commonly known as: LOMOTIL Take 2 tablets by mouth 3 (three) times daily as needed for diarrhea or loose stools.   donepezil 10 MG tablet Commonly known as: ARICEPT Take 1 tablet (10 mg total) by mouth at bedtime.   hydrALAZINE 50 MG tablet Commonly known as: APRESOLINE Take 50 mg by mouth 3 (three) times daily. What changed: Another medication with the same name was removed. Continue taking this medication, and follow the directions you see here.   hydrOXYzine 25 MG tablet Commonly known as: ATARAX/VISTARIL Take 12.5 mg by mouth 2 (two) times daily as needed for anxiety.   isosorbide mononitrate 30 MG 24 hr tablet Commonly known as: IMDUR Take 30 mg by mouth daily. What changed: Another medication with the same name was removed. Continue taking this medication, and follow the directions you see here.   levETIRAcetam 500 MG tablet Commonly known as: KEPPRA Take 500 mg by mouth 2 (two) times daily.   Melatonin 3-2 MG Tabs Take 3 mg by mouth at bedtime.   ondansetron 4 MG disintegrating tablet Commonly known as: ZOFRAN-ODT Take 4 mg by mouth every 8 (eight) hours as needed for nausea or vomiting.   OXYGEN Inhale 2 L into the lungs.   phenazopyridine 95 MG tablet Commonly known as: PYRIDIUM Take 95 mg by mouth 3 (three) times daily as needed for pain.   phenytoin 100 MG ER capsule Commonly known as: DILANTIN Take 3  capsules (300 mg total) by mouth at bedtime.   Sennosides 15 MG Tabs Take 1 tablet by mouth daily as needed (constipation).   sodium bicarbonate 650 MG tablet Take 1 tablet (650 mg total) by mouth 3 (three) times daily.   spironolactone 25 MG tablet Commonly known as: Aldactone Take 1 tablet (25 mg total) by mouth daily for 30 days.   traMADol 50 MG tablet Commonly known as: ULTRAM Take 1 tablet (50 mg total) by mouth 2 (two) times daily as needed for up to 3 days for moderate pain or severe pain.       Allergies  Allergen Reactions  . Codeine Nausea And Vomiting    REACTION: intolerance  . Ace Inhibitors   . Latex Other (See Comments)    itching    Consultations:  None.    Procedures/Studies: CT ABDOMEN PELVIS W CONTRAST  Result Date: 12/07/2019 CLINICAL DATA:  Abdominal pain, vomiting EXAM: CT ABDOMEN AND PELVIS WITH CONTRAST TECHNIQUE: Multidetector CT imaging of the abdomen and pelvis was performed using the standard protocol following bolus administration of intravenous contrast. CONTRAST:  47mL OMNIPAQUE IOHEXOL 300 MG/ML  SOLN COMPARISON:  CT 10/27/2018, 10/20/2019, 10/13/2019, 05/08/2019 FINDINGS: Lower chest: Lung bases demonstrate  no acute consolidation or pleural effusion. The heart size is within normal limits. Hepatobiliary: No focal hepatic abnormality. Small amount of pneumobilia presumably related to prior sphincterotomy. Status post cholecystectomy. Mildly enhancing extrahepatic common bile duct. Pancreas: Indistinct appearance of the head and uncinate process with fluid and inflammatory changes consistent with acute pancreatitis. No organized fluid collections at this time. Punctate focus of gas in the region of inflammatory change at the pancreatic head. No ductal dilatation Spleen: Splenic granuloma Adrenals/Urinary Tract: Adrenal glands are normal. Moderate gas within the right renal collecting system. Similar low-density lesion mid to upper pole right  kidney. Left ureteral stent remains in place, the distal pigtail terminates within the right anterior pelvis, presumably within decompressed and thinned reconstructed bladder. Indwelling Foley catheter is noted. There is decreased left hydronephrosis compared to prior. Numerous subcentimeter hypodense left renal lesions. Mild urothelial enhancement on the right. Stomach/Bowel: The stomach is nonenlarged. Duodenum is indistinct and poorly defined, likely due to inflammation from adjacent pancreatitis. No dilated small bowel. No colon wall thickening. Bowel anastomosis in the right hemiabdomen as before. Vascular/Lymphatic: Moderate severe aortic atherosclerosis without aneurysm. Left iliac stent. No increasing adenopathy. Reproductive: Status post hysterectomy. No adnexal mass. Multiple clips in the pelvis. Other: No free air. Small ventral wall defect to the right of the umbilical region without significant hernia. Musculoskeletal: No acute or suspicious osseous abnormality. IMPRESSION: 1. Indistinct appearance of the pancreatic head and uncinate process with associated fluid and fairly extensive inflammatory changes consistent with an acute pancreatitis. No definite organized fluid collections at this time. Punctate focus of gas in the region of inflammatory change at the pancreatic head, uncertain if this is a small focus of gas in the distal common bile duct versus a tiny gas collection related to early necrosis. Attention on follow-up imaging is recommended. 2. Indistinct appearance of the duodenum presumably due to reactive inflammatory changes from the pancreas. 3. Slight increased gas within the right renal collecting system. Similar positioning of left ureteral stent, with interval decrease in degree of left hydronephrosis. 4. Small amount of pneumobilia, felt related to prior sphincterotomy. This is noted on prior exams. Electronically Signed   By: Donavan Foil M.D.   On: 12/07/2019 22:15   MR ABDOMEN  MRCP WO CONTRAST  Result Date: 12/08/2019 CLINICAL DATA:  Pancreatitis suspected, history of CBD stones by report. EXAM: MRI ABDOMEN WITHOUT CONTRAST  (INCLUDING MRCP) TECHNIQUE: Multiplanar multisequence MR imaging of the abdomen was performed. Heavily T2-weighted images of the biliary and pancreatic ducts were obtained, and three-dimensional MRCP images were rendered by post processing. COMPARISON:  Abdominal sonogram of 12/08/2019 and abdominal CT 12/07/2019. FINDINGS: Lower chest: Basilar atelectasis and signs of trace pleural fluid bilaterally. Lung bases not well assessed on MRI. Hepatobiliary: Liver is normal. Biliary tree mildly dilated with tapered narrowing showing some irregularity in the area of the pancreatic head in the setting of active pancreatic inflammation. No signs of visible filling defect though there was some gas at the ampulla on the CT evaluation of 12/07/2019. Pancreas: Signs of acute interstitial pancreatitis with diffuse anterior pararenal stranding and inflammation, inflammation extending into the root of the small bowel mesentery. Spleen:  Spleen is unremarkable. Adrenals/Urinary Tract:  Adrenal glands are normal. Signs of left nephroureteral stent with patulous collecting systems with slight increase in fullness accounting for differences in technique. Gas in the collecting systems of the right kidney with cortical scarring on the right though not as severe as on the left. Stomach/Bowel: No signs of  acute gastrointestinal process. There is abundant gas in the colon that leads to susceptibility artifact on many sequences. Bowel is incompletely imaged on today's study. Vascular/Lymphatic: No signs of retroperitoneal lymphadenopathy. Numerous small lymph nodes likely reactive in the setting of acute inflammation. Other: Extensive inflammation in the anterior pararenal space. Small volume ascites. No discrete, focal fluid collection. Musculoskeletal: No acute bone finding or destructive  bone process. IMPRESSION: 1. Signs of acute interstitial pancreatitis with diffuse anterior pararenal stranding, non loculated fluid and inflammation extending also into the root of the small bowel mesentery. 2. Mild intra and extrahepatic biliary ductal dilatation with tapered narrowing in the area of the pancreatic head in the setting of active pancreatic inflammation. No evidence of choledocholithiasis. 3. Gas in the collecting systems of the right kidney with cortical scarring on the right though not as severe as on the left. Slight increase in fullness of left intrarenal collecting systems is suggested since the previous exam though comparison on different modalities is challenging, attention on follow-up. 4. Basilar atelectasis and trace pleural fluid bilaterally. 5. Study limited due to technical factors with gross patient motion on some images. Electronically Signed   By: Zetta Bills M.D.   On: 12/08/2019 14:14   MR 3D Recon At Scanner  Result Date: 12/08/2019 CLINICAL DATA:  Pancreatitis suspected, history of CBD stones by report. EXAM: MRI ABDOMEN WITHOUT CONTRAST  (INCLUDING MRCP) TECHNIQUE: Multiplanar multisequence MR imaging of the abdomen was performed. Heavily T2-weighted images of the biliary and pancreatic ducts were obtained, and three-dimensional MRCP images were rendered by post processing. COMPARISON:  Abdominal sonogram of 12/08/2019 and abdominal CT 12/07/2019. FINDINGS: Lower chest: Basilar atelectasis and signs of trace pleural fluid bilaterally. Lung bases not well assessed on MRI. Hepatobiliary: Liver is normal. Biliary tree mildly dilated with tapered narrowing showing some irregularity in the area of the pancreatic head in the setting of active pancreatic inflammation. No signs of visible filling defect though there was some gas at the ampulla on the CT evaluation of 12/07/2019. Pancreas: Signs of acute interstitial pancreatitis with diffuse anterior pararenal stranding and  inflammation, inflammation extending into the root of the small bowel mesentery. Spleen:  Spleen is unremarkable. Adrenals/Urinary Tract:  Adrenal glands are normal. Signs of left nephroureteral stent with patulous collecting systems with slight increase in fullness accounting for differences in technique. Gas in the collecting systems of the right kidney with cortical scarring on the right though not as severe as on the left. Stomach/Bowel: No signs of acute gastrointestinal process. There is abundant gas in the colon that leads to susceptibility artifact on many sequences. Bowel is incompletely imaged on today's study. Vascular/Lymphatic: No signs of retroperitoneal lymphadenopathy. Numerous small lymph nodes likely reactive in the setting of acute inflammation. Other: Extensive inflammation in the anterior pararenal space. Small volume ascites. No discrete, focal fluid collection. Musculoskeletal: No acute bone finding or destructive bone process. IMPRESSION: 1. Signs of acute interstitial pancreatitis with diffuse anterior pararenal stranding, non loculated fluid and inflammation extending also into the root of the small bowel mesentery. 2. Mild intra and extrahepatic biliary ductal dilatation with tapered narrowing in the area of the pancreatic head in the setting of active pancreatic inflammation. No evidence of choledocholithiasis. 3. Gas in the collecting systems of the right kidney with cortical scarring on the right though not as severe as on the left. Slight increase in fullness of left intrarenal collecting systems is suggested since the previous exam though comparison on different modalities is challenging, attention  on follow-up. 4. Basilar atelectasis and trace pleural fluid bilaterally. 5. Study limited due to technical factors with gross patient motion on some images. Electronically Signed   By: Zetta Bills M.D.   On: 12/08/2019 14:14   DG Chest Portable 1 View  Result Date:  12/07/2019 CLINICAL DATA:  Pt c/o lower back and abd pain x "a couple days." Pt has a foley catheter. SOB, concern for PNA by ordering MD. H/o COPD, Stroke, HTN. Smoker. shortness of breath, crackles, concern for pna EXAM: PORTABLE CHEST 1 VIEW COMPARISON:  10/19/2019 FINDINGS: Normal mediastinum and cardiac silhouette. Normal pulmonary vasculature. No evidence of effusion, infiltrate, or pneumothorax. No acute bony abnormality. Lungs are hyperinflated. Remote RIGHT rib fractures. IMPRESSION: Hyperinflated lungs.  No acute findings. Electronically Signed   By: Suzy Bouchard M.D.   On: 12/07/2019 19:50   US Abdomen Limited RUQ  Result Date: 12/08/2019 CLINICAL DATA:  Pancreatitis EXAM: ULTRASOUND ABDOMEN LIMITED RIGHT UPPER QUADRANT COMPARISON:  CT 12/07/2019, ultrasound 10/15/2019 FINDINGS: Gallbladder: Status post cholecystectomy. Common bile duct: Diameter: 8.8 mm Liver: No focal lesion identified. Within normal limits in parenchymal echogenicity. Portal vein is patent on color Doppler imaging with normal direction of blood flow towards the liver. Other: Small amount of perihepatic fluid. IMPRESSION: 1. Status post cholecystectomy. Common bile duct measures up to 8.8 mm, likely within normal limits for post cholecystectomy. 2. Trace perihepatic fluid, felt secondary to pancreatitis Electronically Signed   By: Donavan Foil M.D.   On: 12/08/2019 00:12       Subjective: No new complaints.   Discharge Exam: Vitals:   12/14/19 0509 12/14/19 1335  BP: (!) 167/71 (!) 145/75  Pulse: 76 85  Resp: 16 12  Temp: 98.3 F (36.8 C) 98 F (36.7 C)  SpO2: 97% 94%   Vitals:   12/13/19 1342 12/13/19 2139 12/14/19 0509 12/14/19 1335  BP: (!) 147/68 (!) 146/66 (!) 167/71 (!) 145/75  Pulse: 66 87 76 85  Resp: 15 20 16 12   Temp: 97.8 F (36.6 C) 98.6 F (37 C) 98.3 F (36.8 C) 98 F (36.7 C)  TempSrc: Oral Oral Oral Oral  SpO2: 97% 96% 97% 94%  Weight:      Height:        General: Pt is  alert, awake, not in acute distress Cardiovascular: RRR, S1/S2 +, no rubs, no gallops Respiratory: CTA bilaterally, no wheezing, no rhonchi Abdominal: Soft, NT, ND, bowel sounds + Extremities: no edema, no cyanosis    The results of significant diagnostics from this hospitalization (including imaging, microbiology, ancillary and laboratory) are listed below for reference.     Microbiology: Recent Results (from the past 240 hour(s))  Urine culture     Status: Abnormal   Collection Time: 12/07/19  7:14 PM   Specimen: Urine, Clean Catch  Result Value Ref Range Status   Specimen Description   Final    URINE, CLEAN CATCH Performed at [facility], [place] [address], [place], [place] [address]    Special Requests   Final    NONE Performed at [facility], [place] [address], [place], [place] [address]    Culture MULTIPLE SPECIES PRESENT, SUGGEST RECOLLECTION (A)  Final   Report Status 12/08/2019 FINAL  Final  SARS CORONAVIRUS 2 (TAT 6-24 HRS) Nasopharyngeal Nasopharyngeal Swab     Status: None   Collection Time: 12/07/19 10:33 PM   Specimen: Nasopharyngeal Swab  Result Value Ref Range Status   SARS Coronavirus 2 NEGATIVE NEGATIVE Final  Comment: (NOTE) SARS-CoV-2 target nucleic acids are NOT DETECTED. The SARS-CoV-2 RNA is generally detectable in upper and lower respiratory specimens during the acute phase of infection. Negative results do not preclude SARS-CoV-2 infection, do not rule out co-infections with other pathogens, and should not be used as the sole basis for treatment or other patient management decisions. Negative results must be combined with clinical observations, patient history, and epidemiological information. The expected result is Negative. Fact Sheet for Patients: SugarRoll.be Fact Sheet for Healthcare Providers: https://www.woods-mathews.com/ This test is not yet approved or cleared  by the [place] FDA and  has been authorized for detection and/or diagnosis of SARS-CoV-2 by FDA under an Emergency Use Authorization (EUA). This EUA will remain  in effect (meaning this test can be used) for the duration of the COVID-19 declaration under Section 56 4(b)(1) of the Act, 21 U.S.C. section 360bbb-3(b)(1), unless the authorization is terminated or revoked sooner. Performed at [facility], [place] [address] [place], [place] [address]   MRSA PCR Screening     Status: None   Collection Time: 12/08/19 11:32 PM   Specimen: Nasal Mucosa; Nasopharyngeal  Result Value Ref Range Status   MRSA by PCR NEGATIVE NEGATIVE Final    Comment:        The GeneXpert MRSA Assay (FDA approved for NASAL specimens only), is one component of a comprehensive MRSA colonization surveillance program. It is not intended to diagnose MRSA infection nor to guide or monitor treatment for MRSA infections. Performed at [facility], [place] [address] [place], [place] [address]   Culture, Urine     Status: Abnormal   Collection Time: 12/09/19  5:26 PM   Specimen: In/Out Cath Urine  Result Value Ref Range Status   Specimen Description   Final    IN/OUT CATH URINE Performed at [place] [address] [place], [place] [address]    Special Requests   Final    NONE Performed at [facility], [place] [address] [place],  [address]    Culture MULTIPLE SPECIES PRESENT, SUGGEST RECOLLECTION (A)  Final   Report Status 12/10/2019 FINAL  Final     Labs: BNP (last 3 results) Recent Labs    03/28/19 1332  BNP [id number]   Basic Metabolic Panel: Recent Labs  Lab 12/10/19 0219 12/11/19 0843 12/12/19 1021 12/13/19 0612  NA 136 139 137 138  K 3.0* 4.6 4.6 4.7  CL 100 107 105 108  CO2 26 26 25 23   GLUCOSE 93 90 93 82  BUN 20 18 14 11   CREATININE 1.05* 1.13* 1.09* 1.08*  CALCIUM 6.9* 7.3* 7.4* 7.4*   Liver Function  Tests: Recent Labs  Lab 12/10/19 0219 12/11/19 0843  AST 15 13*  ALT 6 7  ALKPHOS 116 110  BILITOT 0.7 0.5  PROT 4.6* 4.4*  ALBUMIN 2.0* 1.8*   Recent Labs  Lab 12/10/19 0219 12/11/19 0843 12/12/19 1021 12/13/19 0612  LIPASE 154* 106* 75* 56*   No results for input(s): AMMONIA in the last 168 hours. CBC: Recent Labs  Lab 12/10/19 0219 12/11/19 0843 12/13/19 0612  WBC 11.0* 8.3 7.2  NEUTROABS  --  5.0  --   HGB 8.8* 8.4* 7.9*  HCT 27.8* 27.3* 26.6*  MCV 94.2 96.5 98.5  PLT 92* 88* 110*   Cardiac Enzymes: No results for input(s): CKTOTAL, CKMB, CKMBINDEX, TROPONINI in the last 168 hours. BNP: Invalid input(s): POCBNP CBG: Recent Labs  Lab 12/13/19 1953 12/14/19  0000 12/14/19 0508 12/14/19 0825 12/14/19 1156  GLUCAP 74 80 87 78 83   D-Dimer No results for input(s): DDIMER in the last 72 hours. Hgb A1c No results for input(s): HGBA1C in the last 72 hours. Lipid Profile No results for input(s): CHOL, HDL, LDLCALC, TRIG, CHOLHDL, LDLDIRECT in the last 72 hours. Thyroid function studies No results for input(s): TSH, T4TOTAL, T3FREE, THYROIDAB in the last 72 hours.  Invalid input(s): FREET3 Anemia work up No results for input(s): VITAMINB12, FOLATE, FERRITIN, TIBC, IRON, RETICCTPCT in the last 72 hours. Urinalysis    Component Value Date/Time   COLORURINE YELLOW 12/07/2019 1914   APPEARANCEUR TURBID (A) 12/07/2019 1914   LABSPEC 1.018 12/07/2019 1914   PHURINE 6.0 12/07/2019 1914   GLUCOSEU NEGATIVE 12/07/2019 1914   HGBUR MODERATE (A) 12/07/2019 1914   BILIRUBINUR NEGATIVE 12/07/2019 1914   KETONESUR 5 (A) 12/07/2019 1914   PROTEINUR 100 (A) 12/07/2019 1914   UROBILINOGEN 0.2 01/24/2014 0500   NITRITE NEGATIVE 12/07/2019 1914   LEUKOCYTESUR LARGE (A) 12/07/2019 1914   Sepsis Labs Invalid input(s): PROCALCITONIN,  WBC,  LACTICIDVEN Microbiology Recent Results (from the past 240 hour(s))  Urine culture     Status: Abnormal   Collection Time:  12/07/19  7:14 PM   Specimen: Urine, Clean Catch  Result Value Ref Range Status   Specimen Description   Final    URINE, CLEAN CATCH Performed at [facility], [place] [address] [place], [place] [address]    Special Requests   Final    NONE Performed at [facility], [place] [address], [place], [place] [address]    Culture MULTIPLE SPECIES PRESENT, SUGGEST RECOLLECTION (A)  Final   Report Status 12/08/2019 FINAL  Final  SARS CORONAVIRUS 2 (TAT 6-24 HRS) Nasopharyngeal Nasopharyngeal Swab     Status: None   Collection Time: 12/07/19 10:33 PM   Specimen: Nasopharyngeal Swab  Result Value Ref Range Status   SARS Coronavirus 2 NEGATIVE NEGATIVE Final    Comment: (NOTE) SARS-CoV-2 target nucleic acids are NOT DETECTED. The SARS-CoV-2 RNA is generally detectable in upper and lower respiratory specimens during the acute phase of infection. Negative results do not preclude SARS-CoV-2 infection, do not rule out co-infections with other pathogens, and should not be used as the sole basis for treatment or other patient management decisions. Negative results must be combined with clinical observations, patient history, and epidemiological information. The expected result is Negative. Fact Sheet for Patients: SugarRoll[employer]be Fact Sheet for Healthcare Providers: https://www.woods-mathews.com/ This test is not yet approved or cleared by the [place] FDA and  has been authorized for detection and/or diagnosis of SARS-CoV-2 by FDA under an Emergency Use Authorization (EUA). This EUA will remain  in effect (meaning this test can be used) for the duration of the COVID-19 declaration under Section 56 4(b)(1) of the Act, 21 U.S.C. section 360bbb-3(b)(1), unless the authorization is terminated or revoked sooner. Performed at [facility], [place] [address] [place], [place] [address]   MRSA PCR Screening     Status:  None   Collection Time: 12/08/19 11:32 PM   Specimen: Nasal Mucosa; Nasopharyngeal  Result Value Ref Range Status   MRSA by PCR NEGATIVE NEGATIVE Final    Comment:        The GeneXpert MRSA Assay (FDA approved for NASAL specimens only), is one component of a comprehensive MRSA colonization surveillance program. It is not intended to diagnose MRSA infection nor to guide or monitor treatment for MRSA  infections. Performed at [facility], [place] [address], [place], [place] [address]   Culture, Urine     Status: Abnormal   Collection Time: 12/09/19  5:26 PM   Specimen: In/Out Cath Urine  Result Value Ref Range Status   Specimen Description   Final    IN/OUT CATH URINE Performed at [place] [address] [place], [place] [address]    Special Requests   Final    NONE Performed at [facility], [place] [address], [place], [place] [address]    Culture MULTIPLE SPECIES PRESENT, SUGGEST RECOLLECTION (A)  Final   Report Status 12/10/2019 FINAL  Final     Time coordinating discharge: 32 minutes  SIGNED:   Hosie Poisson, MD  [facility]

## 2019-12-17 DIAGNOSIS — R410 Disorientation, unspecified: Secondary | ICD-10-CM | POA: Diagnosis not present

## 2019-12-17 DIAGNOSIS — N3000 Acute cystitis without hematuria: Secondary | ICD-10-CM | POA: Diagnosis not present

## 2019-12-17 DIAGNOSIS — R0902 Hypoxemia: Secondary | ICD-10-CM | POA: Diagnosis not present

## 2019-12-17 DIAGNOSIS — Z743 Need for continuous supervision: Secondary | ICD-10-CM | POA: Diagnosis not present

## 2019-12-17 DIAGNOSIS — B9689 Other specified bacterial agents as the cause of diseases classified elsewhere: Secondary | ICD-10-CM | POA: Diagnosis not present

## 2019-12-17 DIAGNOSIS — R1084 Generalized abdominal pain: Secondary | ICD-10-CM | POA: Diagnosis not present

## 2019-12-17 DIAGNOSIS — I10 Essential (primary) hypertension: Secondary | ICD-10-CM | POA: Diagnosis not present

## 2019-12-17 DIAGNOSIS — R1110 Vomiting, unspecified: Secondary | ICD-10-CM | POA: Diagnosis not present

## 2019-12-19 DIAGNOSIS — N390 Urinary tract infection, site not specified: Secondary | ICD-10-CM | POA: Diagnosis not present

## 2019-12-19 DIAGNOSIS — R109 Unspecified abdominal pain: Secondary | ICD-10-CM | POA: Diagnosis not present

## 2019-12-19 DIAGNOSIS — Z03818 Encounter for observation for suspected exposure to other biological agents ruled out: Secondary | ICD-10-CM | POA: Diagnosis not present

## 2019-12-19 DIAGNOSIS — N183 Chronic kidney disease, stage 3 unspecified: Secondary | ICD-10-CM | POA: Diagnosis not present

## 2019-12-19 DIAGNOSIS — I129 Hypertensive chronic kidney disease with stage 1 through stage 4 chronic kidney disease, or unspecified chronic kidney disease: Secondary | ICD-10-CM | POA: Diagnosis not present

## 2019-12-20 DIAGNOSIS — E785 Hyperlipidemia, unspecified: Secondary | ICD-10-CM | POA: Diagnosis not present

## 2019-12-20 DIAGNOSIS — R4781 Slurred speech: Secondary | ICD-10-CM | POA: Diagnosis not present

## 2019-12-20 DIAGNOSIS — N390 Urinary tract infection, site not specified: Secondary | ICD-10-CM | POA: Diagnosis not present

## 2019-12-20 DIAGNOSIS — M1990 Unspecified osteoarthritis, unspecified site: Secondary | ICD-10-CM | POA: Diagnosis not present

## 2019-12-20 DIAGNOSIS — Z7401 Bed confinement status: Secondary | ICD-10-CM | POA: Diagnosis not present

## 2019-12-20 DIAGNOSIS — I6523 Occlusion and stenosis of bilateral carotid arteries: Secondary | ICD-10-CM | POA: Diagnosis not present

## 2019-12-20 DIAGNOSIS — Z6820 Body mass index (BMI) 20.0-20.9, adult: Secondary | ICD-10-CM | POA: Diagnosis not present

## 2019-12-20 DIAGNOSIS — F17210 Nicotine dependence, cigarettes, uncomplicated: Secondary | ICD-10-CM | POA: Diagnosis not present

## 2019-12-20 DIAGNOSIS — N3020 Other chronic cystitis without hematuria: Secondary | ICD-10-CM | POA: Diagnosis not present

## 2019-12-20 DIAGNOSIS — N1330 Unspecified hydronephrosis: Secondary | ICD-10-CM | POA: Diagnosis not present

## 2019-12-20 DIAGNOSIS — G40909 Epilepsy, unspecified, not intractable, without status epilepticus: Secondary | ICD-10-CM

## 2019-12-20 DIAGNOSIS — N319 Neuromuscular dysfunction of bladder, unspecified: Secondary | ICD-10-CM | POA: Diagnosis not present

## 2019-12-20 DIAGNOSIS — G309 Alzheimer's disease, unspecified: Secondary | ICD-10-CM | POA: Diagnosis not present

## 2019-12-20 DIAGNOSIS — N183 Chronic kidney disease, stage 3 unspecified: Secondary | ICD-10-CM | POA: Diagnosis not present

## 2019-12-20 DIAGNOSIS — I361 Nonrheumatic tricuspid (valve) insufficiency: Secondary | ICD-10-CM | POA: Diagnosis not present

## 2019-12-20 DIAGNOSIS — Z515 Encounter for palliative care: Secondary | ICD-10-CM | POA: Diagnosis not present

## 2019-12-20 DIAGNOSIS — N131 Hydronephrosis with ureteral stricture, not elsewhere classified: Secondary | ICD-10-CM | POA: Diagnosis not present

## 2019-12-20 DIAGNOSIS — Z03818 Encounter for observation for suspected exposure to other biological agents ruled out: Secondary | ICD-10-CM | POA: Diagnosis not present

## 2019-12-20 DIAGNOSIS — R5381 Other malaise: Secondary | ICD-10-CM | POA: Diagnosis not present

## 2019-12-20 DIAGNOSIS — E43 Unspecified severe protein-calorie malnutrition: Secondary | ICD-10-CM | POA: Diagnosis not present

## 2019-12-20 DIAGNOSIS — R29810 Facial weakness: Secondary | ICD-10-CM | POA: Diagnosis not present

## 2019-12-20 DIAGNOSIS — B965 Pseudomonas (aeruginosa) (mallei) (pseudomallei) as the cause of diseases classified elsewhere: Secondary | ICD-10-CM | POA: Diagnosis not present

## 2019-12-20 DIAGNOSIS — R4701 Aphasia: Secondary | ICD-10-CM | POA: Diagnosis not present

## 2019-12-20 DIAGNOSIS — R29711 NIHSS score 11: Secondary | ICD-10-CM | POA: Diagnosis not present

## 2019-12-20 DIAGNOSIS — M2550 Pain in unspecified joint: Secondary | ICD-10-CM | POA: Diagnosis not present

## 2019-12-20 DIAGNOSIS — Z66 Do not resuscitate: Secondary | ICD-10-CM | POA: Diagnosis not present

## 2019-12-20 DIAGNOSIS — R109 Unspecified abdominal pain: Secondary | ICD-10-CM | POA: Diagnosis not present

## 2019-12-20 DIAGNOSIS — I129 Hypertensive chronic kidney disease with stage 1 through stage 4 chronic kidney disease, or unspecified chronic kidney disease: Secondary | ICD-10-CM | POA: Diagnosis not present

## 2019-12-20 DIAGNOSIS — E872 Acidosis: Secondary | ICD-10-CM | POA: Diagnosis not present

## 2019-12-20 DIAGNOSIS — T83511A Infection and inflammatory reaction due to indwelling urethral catheter, initial encounter: Secondary | ICD-10-CM | POA: Diagnosis not present

## 2019-12-20 DIAGNOSIS — R531 Weakness: Secondary | ICD-10-CM | POA: Diagnosis not present

## 2019-12-20 DIAGNOSIS — R4182 Altered mental status, unspecified: Secondary | ICD-10-CM | POA: Diagnosis not present

## 2019-12-20 DIAGNOSIS — Z1624 Resistance to multiple antibiotics: Secondary | ICD-10-CM | POA: Diagnosis not present

## 2019-12-20 DIAGNOSIS — E611 Iron deficiency: Secondary | ICD-10-CM | POA: Diagnosis not present

## 2019-12-20 DIAGNOSIS — J449 Chronic obstructive pulmonary disease, unspecified: Secondary | ICD-10-CM | POA: Diagnosis not present

## 2019-12-20 DIAGNOSIS — Z743 Need for continuous supervision: Secondary | ICD-10-CM | POA: Diagnosis not present

## 2019-12-20 DIAGNOSIS — I10 Essential (primary) hypertension: Secondary | ICD-10-CM | POA: Diagnosis not present

## 2019-12-22 DIAGNOSIS — N131 Hydronephrosis with ureteral stricture, not elsewhere classified: Secondary | ICD-10-CM | POA: Diagnosis not present

## 2019-12-23 DIAGNOSIS — N390 Urinary tract infection, site not specified: Secondary | ICD-10-CM | POA: Diagnosis not present

## 2019-12-23 DIAGNOSIS — N1330 Unspecified hydronephrosis: Secondary | ICD-10-CM | POA: Diagnosis not present

## 2019-12-23 DIAGNOSIS — I10 Essential (primary) hypertension: Secondary | ICD-10-CM

## 2019-12-25 DIAGNOSIS — I361 Nonrheumatic tricuspid (valve) insufficiency: Secondary | ICD-10-CM

## 2019-12-25 DIAGNOSIS — R4182 Altered mental status, unspecified: Secondary | ICD-10-CM

## 2020-01-11 DEATH — deceased
